# Patient Record
Sex: Female | Born: 1955
Health system: Southern US, Community
[De-identification: ages and names within clinical notes are randomized; demographics above are authoritative.]

## PROBLEM LIST (undated history)

## (undated) DIAGNOSIS — Z905 Acquired absence of kidney: Secondary | ICD-10-CM

## (undated) DIAGNOSIS — I5032 Chronic diastolic (congestive) heart failure: Secondary | ICD-10-CM

## (undated) DIAGNOSIS — M1990 Unspecified osteoarthritis, unspecified site: Secondary | ICD-10-CM

## (undated) DIAGNOSIS — R52 Pain, unspecified: Secondary | ICD-10-CM

## (undated) DIAGNOSIS — I219 Acute myocardial infarction, unspecified: Secondary | ICD-10-CM

## (undated) DIAGNOSIS — R918 Other nonspecific abnormal finding of lung field: Secondary | ICD-10-CM

## (undated) DIAGNOSIS — R600 Localized edema: Secondary | ICD-10-CM

## (undated) DIAGNOSIS — F419 Anxiety disorder, unspecified: Secondary | ICD-10-CM

## (undated) DIAGNOSIS — I10 Essential (primary) hypertension: Secondary | ICD-10-CM

## (undated) DIAGNOSIS — E8809 Other disorders of plasma-protein metabolism, not elsewhere classified: Secondary | ICD-10-CM

## (undated) DIAGNOSIS — D249 Benign neoplasm of unspecified breast: Secondary | ICD-10-CM

## (undated) DIAGNOSIS — E039 Hypothyroidism, unspecified: Secondary | ICD-10-CM

## (undated) DIAGNOSIS — I2510 Atherosclerotic heart disease of native coronary artery without angina pectoris: Secondary | ICD-10-CM

## (undated) DIAGNOSIS — C649 Malignant neoplasm of unspecified kidney, except renal pelvis: Secondary | ICD-10-CM

## (undated) HISTORY — DX: Benign neoplasm of unspecified breast: D24.9

## (undated) HISTORY — PX: WISDOM TOOTH EXTRACTION: SHX21

## (undated) HISTORY — DX: Malignant neoplasm of unspecified kidney, except renal pelvis: C64.9

## (undated) HISTORY — PX: PORTA CATH INSERTION: CATH118285

## (undated) HISTORY — PX: OTHER SURGICAL HISTORY: SHX169

## (undated) HISTORY — DX: Acquired absence of kidney: Z90.5

## (undated) HISTORY — PX: CARDIAC CATHETERIZATION: SHX172

---
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
## 2000-07-05 ENCOUNTER — Encounter: Payer: Self-pay | Admitting: Family Medicine

## 2000-07-05 ENCOUNTER — Ambulatory Visit (HOSPITAL_COMMUNITY): Admission: RE | Admit: 2000-07-05 | Discharge: 2000-07-05 | Payer: Self-pay | Admitting: Family Medicine

## 2002-05-01 ENCOUNTER — Emergency Department (HOSPITAL_COMMUNITY): Admission: EM | Admit: 2002-05-01 | Discharge: 2002-05-01 | Payer: Self-pay | Admitting: *Deleted

## 2002-05-10 ENCOUNTER — Encounter: Payer: Self-pay | Admitting: Family Medicine

## 2002-05-10 ENCOUNTER — Encounter: Admission: RE | Admit: 2002-05-10 | Discharge: 2002-05-10 | Payer: Self-pay | Admitting: Family Medicine

## 2004-02-18 ENCOUNTER — Encounter: Admission: RE | Admit: 2004-02-18 | Discharge: 2004-02-18 | Payer: Self-pay | Admitting: Family Medicine

## 2009-09-25 ENCOUNTER — Ambulatory Visit (HOSPITAL_COMMUNITY): Admission: RE | Admit: 2009-09-25 | Discharge: 2009-09-25 | Payer: Self-pay | Admitting: Obstetrics and Gynecology

## 2010-09-05 DIAGNOSIS — D249 Benign neoplasm of unspecified breast: Secondary | ICD-10-CM

## 2010-09-05 HISTORY — DX: Benign neoplasm of unspecified breast: D24.9

## 2010-09-13 ENCOUNTER — Ambulatory Visit (HOSPITAL_COMMUNITY): Admission: RE | Admit: 2010-09-13 | Payer: Self-pay | Source: Home / Self Care | Admitting: Family Medicine

## 2010-09-27 ENCOUNTER — Encounter: Payer: Self-pay | Admitting: Obstetrics and Gynecology

## 2011-06-17 ENCOUNTER — Other Ambulatory Visit: Payer: Self-pay | Admitting: Physician Assistant

## 2011-06-17 DIAGNOSIS — Z1231 Encounter for screening mammogram for malignant neoplasm of breast: Secondary | ICD-10-CM

## 2011-07-05 ENCOUNTER — Ambulatory Visit (HOSPITAL_COMMUNITY)
Admission: RE | Admit: 2011-07-05 | Discharge: 2011-07-05 | Disposition: A | Discharge status: Home / Self Care | Payer: 59 | Source: Ambulatory Visit | Attending: Physician Assistant | Admitting: Physician Assistant

## 2011-07-05 DIAGNOSIS — Z1231 Encounter for screening mammogram for malignant neoplasm of breast: Secondary | ICD-10-CM

## 2011-07-11 ENCOUNTER — Other Ambulatory Visit: Payer: Self-pay | Admitting: Physician Assistant

## 2011-07-11 ENCOUNTER — Other Ambulatory Visit: Payer: 59

## 2011-07-11 DIAGNOSIS — R928 Other abnormal and inconclusive findings on diagnostic imaging of breast: Secondary | ICD-10-CM

## 2011-07-12 ENCOUNTER — Other Ambulatory Visit: Payer: Self-pay | Admitting: Diagnostic Radiology

## 2011-07-12 ENCOUNTER — Other Ambulatory Visit: Payer: Self-pay | Admitting: Physician Assistant

## 2011-07-12 ENCOUNTER — Ambulatory Visit
Admission: RE | Admit: 2011-07-12 | Discharge: 2011-07-12 | Disposition: A | Discharge status: Home / Self Care | Payer: 59 | Source: Ambulatory Visit | Attending: Physician Assistant | Admitting: Physician Assistant

## 2011-07-12 DIAGNOSIS — R928 Other abnormal and inconclusive findings on diagnostic imaging of breast: Secondary | ICD-10-CM

## 2011-08-01 ENCOUNTER — Encounter (INDEPENDENT_AMBULATORY_CARE_PROVIDER_SITE_OTHER): Payer: Self-pay | Admitting: General Surgery

## 2011-08-01 ENCOUNTER — Ambulatory Visit (INDEPENDENT_AMBULATORY_CARE_PROVIDER_SITE_OTHER): Payer: 59 | Admitting: General Surgery

## 2011-08-01 ENCOUNTER — Other Ambulatory Visit (INDEPENDENT_AMBULATORY_CARE_PROVIDER_SITE_OTHER): Payer: Self-pay | Admitting: General Surgery

## 2011-08-01 VITALS — BP 160/108 | HR 80 | Temp 97.6°F | Resp 16 | Ht 68.0 in | Wt 170.8 lb

## 2011-08-01 DIAGNOSIS — N6320 Unspecified lump in the left breast, unspecified quadrant: Secondary | ICD-10-CM

## 2011-08-01 DIAGNOSIS — N63 Unspecified lump in unspecified breast: Secondary | ICD-10-CM

## 2011-08-01 HISTORY — DX: Unspecified lump in the left breast, unspecified quadrant: N63.20

## 2011-08-01 NOTE — Progress Notes (Signed)
Subjective:     Patient ID: Sheri Hernandez, female   DOB: 1956-07-07, 55 y.o.   MRN: [id number]  HPI We are asked to see the patient in consultation by Dr. Azucena Kuba to evaluate her for a left breast papilloma. The patient is a 55 year old white female who recently went for a routine screening mammogram. At that time an abnormality was seen in the 3:00 position of the left breast. This was biopsied and came back as a papilloma. She does not have any breast pain. She denies any discharge from her nipple. She has otherwise been in good health.  Review of Systems  Constitutional: Negative.   HENT: Negative.   Eyes: Negative.   Respiratory: Negative.   Cardiovascular: Negative.   Gastrointestinal: Negative.   Genitourinary: Negative.   Musculoskeletal: Negative.   Skin: Negative.   Neurological: Negative.   Hematological: Negative.   Psychiatric/Behavioral: Negative.        Objective:   Physical Exam  Constitutional: She is oriented to person, place, and time. She appears well-developed and well-nourished.  HENT:  Head: Normocephalic and atraumatic.  Eyes: Conjunctivae and EOM are normal. Pupils are equal, round, and reactive to light.  Neck: Normal range of motion. Neck supple.  Cardiovascular: Normal rate, regular rhythm and normal heart sounds.   Pulmonary/Chest: Effort normal and breath sounds normal.       No palpable mass in either breast. No axillary supraclavicular or cervical lymphadenopathy  Abdominal: Soft. Bowel sounds are normal. She exhibits no mass. There is no tenderness.  Musculoskeletal: Normal range of motion.  Lymphadenopathy:    She has no cervical adenopathy.  Neurological: She is alert and oriented to person, place, and time.  Skin: Skin is warm and dry.  Psychiatric: She has a normal mood and affect. Her behavior is normal.       Assessment:     Left breast papilloma    Plan:     Because of a slight increase risk of cancer and the fact that she has a  mass in her left breast at the site think she would benefit from having this area removed. She would also like to have this done. I have discussed with her in detail the risks and benefits of the operation remove this area as well as some of the technical aspects and she understands and wishes to proceed.

## 2011-08-05 ENCOUNTER — Encounter (HOSPITAL_COMMUNITY): Payer: Self-pay | Admitting: Respiratory Therapy

## 2011-08-11 ENCOUNTER — Encounter (HOSPITAL_COMMUNITY)
Admission: RE | Admit: 2011-08-11 | Discharge: 2011-08-11 | Disposition: A | Discharge status: Home / Self Care | Payer: 59 | Source: Ambulatory Visit | Attending: General Surgery | Admitting: General Surgery

## 2011-08-11 ENCOUNTER — Other Ambulatory Visit: Payer: Self-pay

## 2011-08-11 ENCOUNTER — Ambulatory Visit (HOSPITAL_COMMUNITY): Admission: RE | Admit: 2011-08-11 | Payer: 59 | Source: Ambulatory Visit

## 2011-08-11 ENCOUNTER — Encounter (HOSPITAL_COMMUNITY): Payer: Self-pay

## 2011-08-11 HISTORY — DX: Anxiety disorder, unspecified: F41.9

## 2011-08-11 LAB — CBC
HCT: 41.5 % (ref 36.0–46.0)
Hemoglobin: 14.5 g/dL (ref 12.0–15.0)
MCV: 86.5 fL (ref 78.0–100.0)
RBC: 4.8 MIL/uL (ref 3.87–5.11)
WBC: 8.7 10*3/uL (ref 4.0–10.5)

## 2011-08-11 LAB — SURGICAL PCR SCREEN: Staphylococcus aureus: POSITIVE — AB

## 2011-08-11 LAB — BASIC METABOLIC PANEL
BUN: 12 mg/dL (ref 6–23)
CO2: 28 mEq/L (ref 19–32)
Chloride: 103 mEq/L (ref 96–112)
GFR calc Af Amer: >90 mL/min (ref >90)
Glucose, Bld: 91 mg/dL (ref 70–99)
Potassium: 3.8 mEq/L (ref 3.5–5.1)

## 2011-08-11 NOTE — Progress Notes (Signed)
CXR IN EPIC 

## 2011-08-11 NOTE — Pre-Procedure Instructions (Signed)
20 Sheri Hernandez  08/11/2011   Your procedure is scheduled on:  08/18/11  Report to [facility] at 745 AM.  Call this number if you have problems the morning of surgery: [phone]   Remember:   Do not eat food:After Midnight.  May have clear liquids: up to 4 Hours before arrival.  Clear liquids include soda, tea, black coffee, apple or grape juice, broth.  Take these medicines the morning of surgery with A SIP OF WATER: NONE   Do not wear jewelry, make-up or nail polish.  Do not wear lotions, powders, or perfumes. You may wear deodorant.  Do not shave 48 hours prior to surgery.  Do not bring valuables to the hospital.  Contacts, dentures or bridgework may not be worn into surgery.  Leave suitcase in the car. After surgery it may be brought to your room.  For patients admitted to the hospital, checkout time is 11:00 AM the day of discharge.   Patients discharged the day of surgery will not be allowed to drive home.  Name and phone number of your driver: FAMILY  Special Instructions: CHG Shower Use Special Wash: 1/2 bottle night before surgery and 1/2 bottle morning of surgery.   Please read over the following fact sheets that you were given: Pain Booklet, MRSA Information and Surgical Site Infection Prevention

## 2011-08-17 MED ORDER — CEFAZOLIN SODIUM-DEXTROSE 2-3 GM-% IV SOLR
2.0000 g | INTRAVENOUS | Status: AC
  Administered 2011-08-18: 2 g via INTRAVENOUS
  Filled 2011-08-17: qty 50

## 2011-08-17 MED ORDER — CEFAZOLIN SODIUM-DEXTROSE 2-3 GM-% IV SOLR: 2.0000 g | INTRAVENOUS | Status: DC

## 2011-08-18 ENCOUNTER — Encounter (HOSPITAL_COMMUNITY): Payer: Self-pay | Admitting: Anesthesiology

## 2011-08-18 ENCOUNTER — Other Ambulatory Visit (INDEPENDENT_AMBULATORY_CARE_PROVIDER_SITE_OTHER): Payer: Self-pay | Admitting: General Surgery

## 2011-08-18 ENCOUNTER — Ambulatory Visit
Admission: RE | Admit: 2011-08-18 | Discharge: 2011-08-18 | Disposition: A | Discharge status: Home / Self Care | Payer: 59 | Source: Ambulatory Visit | Attending: General Surgery | Admitting: General Surgery

## 2011-08-18 ENCOUNTER — Encounter (HOSPITAL_COMMUNITY)
Admission: RE | Disposition: A | Discharge status: Home / Self Care | Payer: Self-pay | Source: Ambulatory Visit | Attending: General Surgery

## 2011-08-18 ENCOUNTER — Ambulatory Visit (HOSPITAL_COMMUNITY)
Admission: RE | Admit: 2011-08-18 | Discharge: 2011-08-18 | Disposition: A | Discharge status: Home / Self Care | Payer: 59 | Source: Ambulatory Visit | Attending: General Surgery | Admitting: General Surgery

## 2011-08-18 ENCOUNTER — Encounter (HOSPITAL_COMMUNITY): Payer: Self-pay | Admitting: *Deleted

## 2011-08-18 ENCOUNTER — Ambulatory Visit (HOSPITAL_COMMUNITY): Payer: 59 | Admitting: Anesthesiology

## 2011-08-18 DIAGNOSIS — Z01812 Encounter for preprocedural laboratory examination: Secondary | ICD-10-CM | POA: Insufficient documentation

## 2011-08-18 DIAGNOSIS — Z01810 Encounter for preprocedural cardiovascular examination: Secondary | ICD-10-CM | POA: Insufficient documentation

## 2011-08-18 DIAGNOSIS — N6320 Unspecified lump in the left breast, unspecified quadrant: Secondary | ICD-10-CM

## 2011-08-18 DIAGNOSIS — D249 Benign neoplasm of unspecified breast: Secondary | ICD-10-CM

## 2011-08-18 HISTORY — PX: BREAST BIOPSY: SHX20

## 2011-08-18 SURGERY — BREAST BIOPSY WITH NEEDLE LOCALIZATION
Anesthesia: General | Site: Breast | Laterality: Left | Wound class: Clean

## 2011-08-18 MED ORDER — HYDROCODONE-ACETAMINOPHEN 5-325 MG PO TABS: 1.0000 | ORAL_TABLET | ORAL | Status: AC | PRN

## 2011-08-18 MED ORDER — ROCURONIUM BROMIDE 100 MG/10ML IV SOLN
INTRAVENOUS | Status: DC | PRN
  Administered 2011-08-18: 30 mg via INTRAVENOUS

## 2011-08-18 MED ORDER — MIDAZOLAM HCL 5 MG/5ML IJ SOLN
INTRAMUSCULAR | Status: DC | PRN
  Administered 2011-08-18: 2 mg via INTRAVENOUS

## 2011-08-18 MED ORDER — PROPOFOL 10 MG/ML IV EMUL
INTRAVENOUS | Status: DC | PRN
  Administered 2011-08-18: 150 mg via INTRAVENOUS

## 2011-08-18 MED ORDER — LIDOCAINE HCL (CARDIAC) 20 MG/ML IV SOLN
INTRAVENOUS | Status: DC | PRN
  Administered 2011-08-18: 80 mg via INTRAVENOUS

## 2011-08-18 MED ORDER — HYDROMORPHONE HCL PF 1 MG/ML IJ SOLN: 0.2500 mg | INTRAMUSCULAR | Status: DC | PRN

## 2011-08-18 MED ORDER — GLYCOPYRROLATE 0.2 MG/ML IJ SOLN
INTRAMUSCULAR | Status: DC | PRN
  Administered 2011-08-18: .8 mg via INTRAVENOUS

## 2011-08-18 MED ORDER — ONDANSETRON HCL 4 MG/2ML IJ SOLN
INTRAMUSCULAR | Status: DC | PRN
  Administered 2011-08-18: 4 mg via INTRAVENOUS

## 2011-08-18 MED ORDER — ONDANSETRON HCL 4 MG/2ML IJ SOLN
4.0000 mg | Freq: Four times a day (QID) | INTRAMUSCULAR | Status: DC | PRN
Start: 2011-08-18 — End: 2011-08-18

## 2011-08-18 MED ORDER — FENTANYL CITRATE 0.05 MG/ML IJ SOLN
INTRAMUSCULAR | Status: DC | PRN
  Administered 2011-08-18: 50 ug via INTRAVENOUS

## 2011-08-18 MED ORDER — BUPIVACAINE-EPINEPHRINE 0.25% -1:200000 IJ SOLN
INTRAMUSCULAR | Status: DC | PRN
  Administered 2011-08-18: 13 mL

## 2011-08-18 MED ORDER — NEOSTIGMINE METHYLSULFATE 1 MG/ML IJ SOLN
INTRAMUSCULAR | Status: DC | PRN
  Administered 2011-08-18: 5 mg via INTRAVENOUS

## 2011-08-18 MED ORDER — LACTATED RINGERS IV SOLN
INTRAVENOUS | Status: DC | PRN
  Administered 2011-08-18 (×2): via INTRAVENOUS

## 2011-08-18 SURGICAL SUPPLY — 43 items
APPLIER CLIP 9.375 MED OPEN (MISCELLANEOUS) ×2
BINDER BREAST LRG (GAUZE/BANDAGES/DRESSINGS) IMPLANT
BINDER BREAST XLRG (GAUZE/BANDAGES/DRESSINGS) IMPLANT
BLADE SURG 10 STRL SS (BLADE) ×2 IMPLANT
BLADE SURG 15 STRL LF DISP TIS (BLADE) IMPLANT
BLADE SURG 15 STRL SS (BLADE)
CANISTER SUCTION 2500CC (MISCELLANEOUS) IMPLANT
CHLORAPREP W/TINT 26ML (MISCELLANEOUS) ×2 IMPLANT
CLIP APPLIE 9.375 MED OPEN (MISCELLANEOUS) ×1 IMPLANT
CLOTH BEACON ORANGE TIMEOUT ST (SAFETY) ×2 IMPLANT
CONT SPEC 4OZ CLIKSEAL STRL BL (MISCELLANEOUS) IMPLANT
COVER SURGICAL LIGHT HANDLE (MISCELLANEOUS) ×2 IMPLANT
DERMABOND ADHESIVE PROPEN (GAUZE/BANDAGES/DRESSINGS) ×1
DERMABOND ADVANCED (GAUZE/BANDAGES/DRESSINGS) ×1
DERMABOND ADVANCED .7 DNX12 (GAUZE/BANDAGES/DRESSINGS) ×1 IMPLANT
DERMABOND ADVANCED .7 DNX6 (GAUZE/BANDAGES/DRESSINGS) ×1 IMPLANT
DEVICE DUBIN SPECIMEN MAMMOGRA (MISCELLANEOUS) ×2 IMPLANT
DRAPE LAPAROSCOPIC ABDOMINAL (DRAPES) ×2 IMPLANT
ELECT COATED BLADE 2.86 ST (ELECTRODE) ×2 IMPLANT
ELECT REM PT RETURN 9FT ADLT (ELECTROSURGICAL) ×2
ELECTRODE REM PT RTRN 9FT ADLT (ELECTROSURGICAL) ×1 IMPLANT
GLOVE BIO SURGEON STRL SZ7.5 (GLOVE) ×2 IMPLANT
GOWN STRL NON-REIN LRG LVL3 (GOWN DISPOSABLE) ×6 IMPLANT
KIT BASIN OR (CUSTOM PROCEDURE TRAY) ×2 IMPLANT
KIT MARKER MARGIN INK (KITS) IMPLANT
KIT ROOM TURNOVER OR (KITS) ×2 IMPLANT
NEEDLE HYPO 25GX1X1/2 BEV (NEEDLE) ×2 IMPLANT
NS IRRIG 1000ML POUR BTL (IV SOLUTION) ×2 IMPLANT
PACK SURGICAL SETUP 50X90 (CUSTOM PROCEDURE TRAY) ×2 IMPLANT
PAD ARMBOARD 7.5X6 YLW CONV (MISCELLANEOUS) ×2 IMPLANT
PENCIL BUTTON HOLSTER BLD 10FT (ELECTRODE) ×2 IMPLANT
SPONGE LAP 18X18 X RAY DECT (DISPOSABLE) ×2 IMPLANT
SUT MON AB 4-0 PC3 18 (SUTURE) ×2 IMPLANT
SUT SILK 2 0 SH (SUTURE) ×2 IMPLANT
SUT VIC AB 3-0 SH 27 (SUTURE) ×1
SUT VIC AB 3-0 SH 27XBRD (SUTURE) ×1 IMPLANT
SYR BULB 3OZ (MISCELLANEOUS) ×2 IMPLANT
SYR CONTROL 10ML LL (SYRINGE) ×4 IMPLANT
TOWEL OR 17X24 6PK STRL BLUE (TOWEL DISPOSABLE) ×2 IMPLANT
TOWEL OR 17X26 10 PK STRL BLUE (TOWEL DISPOSABLE) ×2 IMPLANT
TUBE CONNECTING 12X1/4 (SUCTIONS) IMPLANT
WATER STERILE IRR 1000ML POUR (IV SOLUTION) IMPLANT
YANKAUER SUCT BULB TIP NO VENT (SUCTIONS) ×2 IMPLANT

## 2011-08-18 NOTE — Anesthesia Postprocedure Evaluation (Signed)
Anesthesia Post Note  Patient: Sheri Hernandez  Procedure(s) Performed:  BREAST BIOPSY WITH NEEDLE LOCALIZATION - left breast biopsy with needle localization  Anesthesia type: General  Patient location: PACU  Post pain: Pain level controlled and Adequate analgesia  Post assessment: Post-op Vital signs reviewed, Patient's Cardiovascular Status Stable, Respiratory Function Stable, Patent Airway and Pain level controlled  Last Vitals:  Filed Vitals:   08/18/11 1240  BP: 137/82  Pulse: 68  Temp:   Resp: 19    Post vital signs: Reviewed and stable  Level of consciousness: awake, alert  and oriented  Complications: No apparent anesthesia complications

## 2011-08-18 NOTE — Anesthesia Preprocedure Evaluation (Addendum)
Anesthesia Evaluation  Patient identified by MRN, date of birth, ID band Patient awake    Reviewed: Allergy & Precautions, H&P , NPO status , Patient's Chart, lab work & pertinent test results  Airway Mallampati: II  Neck ROM: full    Dental  (+) Dental Advisory Given and Teeth Intact   Pulmonary          Cardiovascular     Neuro/Psych    GI/Hepatic   Endo/Other    Renal/GU      Musculoskeletal   Abdominal   Peds  Hematology   Anesthesia Other Findings   Reproductive/Obstetrics                          Anesthesia Physical Anesthesia Plan  ASA: II  Anesthesia Plan: General   Post-op Pain Management:    Induction: Intravenous  Airway Management Planned: LMA  Additional Equipment:   Intra-op Plan:   Post-operative Plan:   Informed Consent: I have reviewed the patients History and Physical, chart, labs and discussed the procedure including the risks, benefits and alternatives for the proposed anesthesia with the patient or authorized representative who has indicated his/her understanding and acceptance.     Plan Discussed with: CRNA and Surgeon  Anesthesia Plan Comments:         Anesthesia Quick Evaluation

## 2011-08-18 NOTE — Interval H&P Note (Signed)
History and Physical Interval Note:  08/18/2011 9:48 AM  Sheri Hernandez  has presented today for surgery, with the diagnosis of left breast papilloma  The various methods of treatment have been discussed with the patient and family. After consideration of risks, benefits and other options for treatment, the patient has consented to  Procedure(s): BREAST BIOPSY WITH NEEDLE LOCALIZATION as a surgical intervention .  The patients' history has been reviewed, patient examined, no change in status, stable for surgery.  I have reviewed the patients' chart and labs.  Questions were answered to the patient's satisfaction.     TOTH III,[name]

## 2011-08-18 NOTE — Transfer of Care (Signed)
Immediate Anesthesia Transfer of Care Note  Patient: Sheri Hernandez  Procedure(s) Performed:  BREAST BIOPSY WITH NEEDLE LOCALIZATION - left breast biopsy with needle localization  Patient Location: PACU  Anesthesia Type: General  Level of Consciousness: awake and sedated  Airway & Oxygen Therapy: Patient Spontanous Breathing and Patient connected to nasal cannula oxygen  Post-op Assessment: Report given to PACU RN  Post vital signs: Reviewed and stable  Complications: No apparent anesthesia complications

## 2011-08-18 NOTE — H&P (View-Only) (Signed)
Subjective:     Patient ID: Sheri Hernandez, female   DOB: 07/05/1956, 55 y.o.   MRN: [id number]  HPI We are asked to see the patient in consultation by Dr. Reid to evaluate her for a left breast papilloma. The patient is a 55-year-old white female who recently went for a routine screening mammogram. At that time an abnormality was seen in the 3:00 position of the left breast. This was biopsied and came back as a papilloma. She does not have any breast pain. She denies any discharge from her nipple. She has otherwise been in good health.  Review of Systems  Constitutional: Negative.   HENT: Negative.   Eyes: Negative.   Respiratory: Negative.   Cardiovascular: Negative.   Gastrointestinal: Negative.   Genitourinary: Negative.   Musculoskeletal: Negative.   Skin: Negative.   Neurological: Negative.   Hematological: Negative.   Psychiatric/Behavioral: Negative.        Objective:   Physical Exam  Constitutional: She is oriented to person, place, and time. She appears well-developed and well-nourished.  HENT:  Head: Normocephalic and atraumatic.  Eyes: Conjunctivae and EOM are normal. Pupils are equal, round, and reactive to light.  Neck: Normal range of motion. Neck supple.  Cardiovascular: Normal rate, regular rhythm and normal heart sounds.   Pulmonary/Chest: Effort normal and breath sounds normal.       No palpable mass in either breast. No axillary supraclavicular or cervical lymphadenopathy  Abdominal: Soft. Bowel sounds are normal. She exhibits no mass. There is no tenderness.  Musculoskeletal: Normal range of motion.  Lymphadenopathy:    She has no cervical adenopathy.  Neurological: She is alert and oriented to person, place, and time.  Skin: Skin is warm and dry.  Psychiatric: She has a normal mood and affect. Her behavior is normal.       Assessment:     Left breast papilloma    Plan:     Because of a slight increase risk of cancer and the fact that she has a  mass in her left breast at the site think she would benefit from having this area removed. She would also like to have this done. I have discussed with her in detail the risks and benefits of the operation remove this area as well as some of the technical aspects and she understands and wishes to proceed.      

## 2011-08-18 NOTE — Preoperative (Signed)
Beta Blockers   Reason not to administer Beta Blockers:Not Applicable 

## 2011-08-18 NOTE — Anesthesia Procedure Notes (Signed)
Procedure Name: Intubation Date/Time: 08/18/2011 1:00 AM Performed by: Malachi Pro Pre-anesthesia Checklist: Patient identified, Emergency Drugs available, Suction available, Patient being monitored and Timeout performed Patient Re-evaluated:Patient Re-evaluated prior to inductionOxygen Delivery Method: Circle System Utilized Preoxygenation: Pre-oxygenation with 100% oxygen Intubation Type: Combination inhalational/ intravenous induction Ventilation: Mask ventilation without difficulty Grade View: Grade II Tube type: Oral Tube size: 7.5 mm Number of attempts: 1 Airway Equipment and Method: stylet Placement Confirmation: positive ETCO2,  breath sounds checked- equal and bilateral and ETT inserted through vocal cords under direct vision Secured at: 23 cm Tube secured with: Tape Dental Injury: Teeth and Oropharynx as per pre-operative assessment

## 2011-08-18 NOTE — Op Note (Signed)
08/18/2011  11:40 AM  PATIENT:  Sheri Hernandez  55 y.o. female  PRE-OPERATIVE DIAGNOSIS:  left breast papilloma  POST-OPERATIVE DIAGNOSIS:  left breast papilloma  PROCEDURE:  Procedure(s): BREAST BIOPSY WITH NEEDLE LOCALIZATION  SURGEON:  Surgeon(s): Caleen Essex III, MD  PHYSICIAN ASSISTANT:   ASSISTANTS: none   ANESTHESIA:   general  EBL:  Total I/O In: 1400 [I.V.:1400] Out: 25 [Blood:25]  BLOOD ADMINISTERED:none  DRAINS: none   LOCAL MEDICATIONS USED:  MARCAINE 13CC  SPECIMEN:  Excision  DISPOSITION OF SPECIMEN:  PATHOLOGY  COUNTS:  YES  TOURNIQUET:  * No tourniquets in log *  DICTATION: .Dragon Dictation after informed consent was obtained the patient was brought to the operating room placed in the supine position on the operating room table. After adequate induction of general anesthesia the patient's left breast was prepped with ChloraPrep, while the dry, and draped in usual sterile fashion. Earlier in the day the patient underwent a wire localization procedure and the wire was entering the left breast laterally and headed medially just beneath the areola. A curvilinear incision was made with a 15 blade knife along the edge of the areola laterally. This incision was carried down through the skin and subcutaneous tissue sharply with the electrocautery. Once into the breast tissues the path of the wire could be palpated. A circular portion of breast tissue was excised sharply around the path of the wire with the electrocautery. Once the specimen was removed a specimen radiograph was obtained. The clip and mass were both within the specimen. Hemostasis was achieved using the Bovie electrocautery. The wound was infiltrated with quarter percent Marcaine. The deep layer of the wound was then closed with interrupted 3-0 Vicryl stitches. The skin was closed with interrupted 4 Monocryl subcuticular stitches. Dermabond dressing was applied.  The patient tolerated the procedure  well. At the end of the case all needle sponge and instrument counts were correct. The patient was then awakened and taken to recovery in stable condition.  PLAN OF CARE: Discharge to home after PACU  PATIENT DISPOSITION:  PACU - hemodynamically stable.   Delay start of Pharmacological VTE agent (>24hrs) due to surgical blood loss or risk of bleeding:  {YES/NO/NOT APPLICABLE:20182

## 2011-08-19 ENCOUNTER — Encounter (HOSPITAL_COMMUNITY): Payer: Self-pay | Admitting: General Surgery

## 2011-09-09 ENCOUNTER — Ambulatory Visit (INDEPENDENT_AMBULATORY_CARE_PROVIDER_SITE_OTHER): Payer: 59 | Admitting: General Surgery

## 2011-09-09 ENCOUNTER — Encounter (INDEPENDENT_AMBULATORY_CARE_PROVIDER_SITE_OTHER): Payer: Self-pay | Admitting: General Surgery

## 2011-09-09 VITALS — BP 144/92 | HR 68 | Temp 97.8°F | Resp 18 | Ht 68.0 in | Wt 167.8 lb

## 2011-09-09 DIAGNOSIS — N6320 Unspecified lump in the left breast, unspecified quadrant: Secondary | ICD-10-CM

## 2011-09-09 DIAGNOSIS — N63 Unspecified lump in unspecified breast: Secondary | ICD-10-CM

## 2011-09-13 ENCOUNTER — Encounter (INDEPENDENT_AMBULATORY_CARE_PROVIDER_SITE_OTHER): Payer: Self-pay | Admitting: General Surgery

## 2011-09-13 NOTE — Progress Notes (Signed)
Subjective:     Patient ID: Sheri Hernandez, female   DOB: 18-Feb-1956, 56 y.o.   MRN: [id number]  HPI The patient is a 56 year old white female who is now 3 weeks out from a left breast biopsy. Since the surgery she's had some mild soreness but this is gradually improving. She's had no drainage or discharge from the area. Her pathology showed the area to be a benign papilloma.  Review of Systems     Objective:   Physical Exam On exam her left breast incision is healing nicely. There is no sign of infection.    Assessment:     Status post left breast biopsy for benign lesion    Plan:     At this point she seems to be healing very nicely. I think she can go back to her normal screening mammogram schedule. We will turn her care back over to her medical doctor. She agrees tocolysis she has any problems in the future.

## 2011-10-13 ENCOUNTER — Encounter (INDEPENDENT_AMBULATORY_CARE_PROVIDER_SITE_OTHER): Payer: Self-pay | Admitting: Surgery

## 2011-10-13 ENCOUNTER — Ambulatory Visit (INDEPENDENT_AMBULATORY_CARE_PROVIDER_SITE_OTHER): Payer: 59 | Admitting: Surgery

## 2011-10-13 VITALS — BP 142/90 | HR 96 | Temp 97.0°F | Resp 20 | Ht 68.0 in | Wt 170.0 lb

## 2011-10-13 DIAGNOSIS — Z9889 Other specified postprocedural states: Secondary | ICD-10-CM

## 2011-10-13 NOTE — Progress Notes (Signed)
Chief complaint: Problem with her incision  History of present illness: The patient underwent excisional biopsy of an intraductal papilloma from the left breast on August 18, 2011. She is basically done fine since then but she is noticed in some greenish "moldy" looking material and she was concerned about this. She came to the urgent office for evaluation.  Exam: Breasts: The incision in the left circumareolar area is healing nicely. There is some old Dermabond still coating the area between the nipple and the incision which I peeled off. This was a material that she was concerned about. There was no evidence of any infection.  Impression doing well  Plan: RTC PRN

## 2012-06-29 ENCOUNTER — Other Ambulatory Visit: Payer: Self-pay | Admitting: Family Medicine

## 2012-06-29 ENCOUNTER — Ambulatory Visit
Admission: RE | Admit: 2012-06-29 | Discharge: 2012-06-29 | Disposition: A | Discharge status: Home / Self Care | Payer: 59 | Source: Ambulatory Visit | Attending: Family Medicine | Admitting: Family Medicine

## 2012-06-29 DIAGNOSIS — R55 Syncope and collapse: Secondary | ICD-10-CM

## 2012-06-29 DIAGNOSIS — R519 Headache, unspecified: Secondary | ICD-10-CM

## 2012-06-29 MED ORDER — GADOBENATE DIMEGLUMINE 529 MG/ML IV SOLN
15.0000 mL | Freq: Once | INTRAVENOUS | Status: AC | PRN
  Administered 2012-06-29: 15 mL via INTRAVENOUS

## 2012-09-05 HISTORY — PX: BRONCHIAL BRUSH BIOPSY: SHX448

## 2012-09-25 ENCOUNTER — Other Ambulatory Visit: Payer: Self-pay | Admitting: Family Medicine

## 2012-09-25 ENCOUNTER — Ambulatory Visit
Admission: RE | Admit: 2012-09-25 | Discharge: 2012-09-25 | Disposition: A | Discharge status: Home / Self Care | Payer: 59 | Source: Ambulatory Visit | Attending: Family Medicine | Admitting: Family Medicine

## 2012-09-25 DIAGNOSIS — R0989 Other specified symptoms and signs involving the circulatory and respiratory systems: Secondary | ICD-10-CM

## 2012-09-25 DIAGNOSIS — R059 Cough, unspecified: Secondary | ICD-10-CM

## 2012-09-25 DIAGNOSIS — R05 Cough: Secondary | ICD-10-CM

## 2012-09-27 ENCOUNTER — Other Ambulatory Visit: Payer: Self-pay | Admitting: Family Medicine

## 2012-09-27 DIAGNOSIS — R918 Other nonspecific abnormal finding of lung field: Secondary | ICD-10-CM

## 2012-09-28 ENCOUNTER — Ambulatory Visit
Admission: RE | Admit: 2012-09-28 | Discharge: 2012-09-28 | Disposition: A | Discharge status: Home / Self Care | Payer: 59 | Source: Ambulatory Visit | Attending: Family Medicine | Admitting: Family Medicine

## 2012-09-28 DIAGNOSIS — R918 Other nonspecific abnormal finding of lung field: Secondary | ICD-10-CM

## 2012-10-01 ENCOUNTER — Encounter: Payer: Self-pay | Admitting: Pulmonary Disease

## 2012-10-01 ENCOUNTER — Ambulatory Visit (INDEPENDENT_AMBULATORY_CARE_PROVIDER_SITE_OTHER): Payer: 59 | Admitting: Pulmonary Disease

## 2012-10-01 ENCOUNTER — Telehealth: Payer: Self-pay | Admitting: *Deleted

## 2012-10-01 VITALS — BP 122/78 | HR 99 | Temp 98.1°F | Ht 69.0 in | Wt 163.8 lb

## 2012-10-01 DIAGNOSIS — N2889 Other specified disorders of kidney and ureter: Secondary | ICD-10-CM | POA: Insufficient documentation

## 2012-10-01 DIAGNOSIS — R918 Other nonspecific abnormal finding of lung field: Secondary | ICD-10-CM | POA: Insufficient documentation

## 2012-10-01 DIAGNOSIS — R222 Localized swelling, mass and lump, trunk: Secondary | ICD-10-CM

## 2012-10-01 DIAGNOSIS — N289 Disorder of kidney and ureter, unspecified: Secondary | ICD-10-CM

## 2012-10-01 HISTORY — DX: Other specified disorders of kidney and ureter: N28.89

## 2012-10-01 MED ORDER — HYDROCODONE-HOMATROPINE 5-1.5 MG/5ML PO SYRP: 5.0000 mL | ORAL_SOLUTION | Freq: Four times a day (QID) | ORAL | Status: DC | PRN

## 2012-10-01 NOTE — Telephone Encounter (Signed)
Per [facility] for patients cough... Hycodan Syrup Take 5mL every 6 hours as needed per cough. #6oz/163mL  NO REFILLS.  Phoned into [facility] [address] [place], [place] Patients husband aware that this has been phoned in.  Patients husband Iantha Fallen did have a concern about the cough and wanted to clarify with Dr Shelle Iron. Is pt's cough d/t the lung condition or her bronchitis.   Please advise Dr Shelle Iron, thanks.

## 2012-10-01 NOTE — Addendum Note (Signed)
Addended by: Nita Sells on: 10/01/2012 11:53 AM   Modules accepted: Orders

## 2012-10-01 NOTE — Assessment & Plan Note (Signed)
The patient has bilateral masses that are most consistent with metastatic disease from her renal mass that is felt to be a renal cell carcinoma.  She also has a very large right infrahilar mass with compression of her bronchus intermedius.  She is obviously going to need a PET scan, and then weaning to decide what is the easiest site and to gain tissue.  My only other question is whether her right infrahilar mass could be something different from the rest of her pulmonary findings.  With her long-standing history of smoking, this could represent small cell lung cancer.  We will do the PET scan and then decide about biopsy site.

## 2012-10-01 NOTE — Telephone Encounter (Signed)
Left message with phone number to call

## 2012-10-01 NOTE — Patient Instructions (Addendum)
Will setup for  PET scan, and will call with results Will decide about biopsy once the pet scan is done.

## 2012-10-01 NOTE — Progress Notes (Signed)
  Subjective:    Patient ID: Sheri Hernandez, female    DOB: 06/16/1956, 57 y.o.   MRN: [id number]  HPI The patient is a 57-year-old female who I've been asked to see for an abnormal CT chest.  She's been having a persistent cough, and a recent chest x-ray showed abnormal densities.  She then underwent a CT scan of her chest and abdomen, and the abdominal CT showed a large left renal mass most consistent with renal cell carcinoma.  The CT of her chest showed large rounded masses bilaterally, most consistent with metastatic disease.  She also had a large right infrahilar lymph node mass.  The patient does have a long-standing history of smoking.  She has not had any hemoptysis, and has been eating fairly well.  Her weight has been stable except for a few pounds of weight loss over the last 2 weeks.  She denies any abdominal or chest pain, but does have upper back discomfort.   Review of Systems  Constitutional: Positive for appetite change ( slowly returning since Hycodan syrup usage). Negative for fever and unexpected weight change.  HENT: Positive for congestion and postnasal drip. Negative for ear pain, nosebleeds, sore throat, rhinorrhea, sneezing, trouble swallowing, dental problem and sinus pressure.   Eyes: Negative for redness and itching.  Respiratory: Positive for cough ( dry---unable to cough up mucus) and wheezing ( x 4-5 days). Negative for chest tightness and shortness of breath.   Cardiovascular: Negative for palpitations and leg swelling.  Gastrointestinal: Negative for nausea and vomiting.  Genitourinary: Negative for dysuria.  Musculoskeletal: Negative for joint swelling.  Skin: Negative for rash.  Neurological: Negative for headaches.  Hematological: Does not bruise/bleed easily.  Psychiatric/Behavioral: Positive for dysphoric mood. The patient is nervous/anxious.        Objective:   Physical Exam Constitutional:  Well developed, no acute distress  HENT:  Nares patent  without discharge  Oropharynx without exudate, palate and uvula are normal  Eyes:  Perrla, eomi, no scleral icterus  Neck:  No JVD, no TMG  Cardiovascular:  Normal rate, regular rhythm, no rubs or gallops.  No murmurs        Intact distal pulses  Pulmonary :  Normal breath sounds, no stridor or respiratory distress   No rales, rhonchi, or wheezing  Abdominal:  Soft, nondistended, bowel sounds present.  No tenderness noted.   Musculoskeletal:  No lower extremity edema noted.  Lymph Nodes:  No cervical lymphadenopathy noted  Skin:  No cyanosis noted  Neurologic:  Alert, appropriate, moves all 4 extremities without obvious deficit.         Assessment & Plan:   

## 2012-10-01 NOTE — Telephone Encounter (Signed)
Left voice mail message with phone number to call

## 2012-10-02 ENCOUNTER — Telehealth: Payer: Self-pay | Admitting: *Deleted

## 2012-10-02 NOTE — Telephone Encounter (Signed)
Left message for pt to call with phone number 

## 2012-10-02 NOTE — Telephone Encounter (Signed)
Spoke with pt regarding referral and what will happen next for pt.  She verbalized understanding

## 2012-10-05 ENCOUNTER — Ambulatory Visit (HOSPITAL_COMMUNITY)
Admission: RE | Admit: 2012-10-05 | Discharge: 2012-10-05 | Disposition: A | Discharge status: Home / Self Care | Payer: 59 | Source: Ambulatory Visit | Attending: Pulmonary Disease | Admitting: Pulmonary Disease

## 2012-10-05 ENCOUNTER — Encounter (HOSPITAL_COMMUNITY): Payer: Self-pay

## 2012-10-05 DIAGNOSIS — N289 Disorder of kidney and ureter, unspecified: Secondary | ICD-10-CM | POA: Insufficient documentation

## 2012-10-05 DIAGNOSIS — R222 Localized swelling, mass and lump, trunk: Secondary | ICD-10-CM | POA: Insufficient documentation

## 2012-10-05 DIAGNOSIS — R918 Other nonspecific abnormal finding of lung field: Secondary | ICD-10-CM

## 2012-10-05 MED ORDER — FLUDEOXYGLUCOSE F - 18 (FDG) INJECTION
19.1000 | Freq: Once | INTRAVENOUS | Status: AC | PRN
  Administered 2012-10-05: 19.1 via INTRAVENOUS

## 2012-10-08 ENCOUNTER — Telehealth: Payer: Self-pay | Admitting: Pulmonary Disease

## 2012-10-08 NOTE — Telephone Encounter (Signed)
Spouse called back again re: same. Wants to hear back soon today- not later in day. Sheri Hernandez

## 2012-10-08 NOTE — Telephone Encounter (Signed)
I spoke with the pt husband and advised that we do not have hte results available and that Dr. Shelle Iron is seeing patients today but that I will send him a message to let him know that he is requesting the results. He also states for Dr. Shelle Iron to call [phone], do not call the pt with results. Please advise. Carron Curie, CMA

## 2012-10-10 ENCOUNTER — Encounter (HOSPITAL_COMMUNITY): Payer: Self-pay

## 2012-10-10 ENCOUNTER — Telehealth: Payer: Self-pay | Admitting: *Deleted

## 2012-10-10 NOTE — Telephone Encounter (Signed)
Done.  Discussed need for bronchoscopy to diagnosis large right hilar/mediastinal mass to make sure is not a concomitant lung cancer.  He agrees and will discuss with wife.  Her prefers that we talk with him because he is distraught, but will discuss with her as well on the day of the procedure to talk about risk/benefits.

## 2012-10-10 NOTE — Telephone Encounter (Signed)
Patients husband aware of Bronch appt and what all he needs to do the morning of the procedure. Pt to call if anything further needed.

## 2012-10-10 NOTE — Telephone Encounter (Signed)
Pt's spouse called back again re: biopsy. [phone]. Sheri Hernandez

## 2012-10-10 NOTE — Telephone Encounter (Signed)
Left message on voicemail to return call--Mr [facility]. [phone]  Bronch scheduled for 10/11/12 at 730 at [facility]. Patient needs instructions for procedure:  NPO after 12am....HOLD morning meds Arrive at 6:15am at admitting--will be directed to CardioPulmonary Dept.  Bring insurance information with you.

## 2012-10-10 NOTE — Telephone Encounter (Signed)
Dr. Shelle Iron, pts husband returned your call.  He can be reached today at the # provided below.

## 2012-10-10 NOTE — Telephone Encounter (Signed)
Pt's husband states he is returning KC's call & asked to be reached today at [phone].  Thanks!  Antionette Fairy

## 2012-10-11 ENCOUNTER — Ambulatory Visit (HOSPITAL_COMMUNITY): Payer: 59

## 2012-10-11 ENCOUNTER — Ambulatory Visit (HOSPITAL_COMMUNITY)
Admission: RE | Admit: 2012-10-11 | Discharge: 2012-10-11 | Disposition: A | Discharge status: Home / Self Care | Payer: 59 | Source: Ambulatory Visit | Attending: Pulmonary Disease | Admitting: Pulmonary Disease

## 2012-10-11 ENCOUNTER — Encounter (HOSPITAL_COMMUNITY): Payer: Self-pay | Admitting: Radiology

## 2012-10-11 ENCOUNTER — Encounter (HOSPITAL_COMMUNITY)
Admission: RE | Disposition: A | Discharge status: Home / Self Care | Payer: Self-pay | Source: Ambulatory Visit | Attending: Pulmonary Disease

## 2012-10-11 DIAGNOSIS — R059 Cough, unspecified: Secondary | ICD-10-CM | POA: Insufficient documentation

## 2012-10-11 DIAGNOSIS — R222 Localized swelling, mass and lump, trunk: Secondary | ICD-10-CM | POA: Insufficient documentation

## 2012-10-11 DIAGNOSIS — F17200 Nicotine dependence, unspecified, uncomplicated: Secondary | ICD-10-CM | POA: Insufficient documentation

## 2012-10-11 DIAGNOSIS — R05 Cough: Secondary | ICD-10-CM | POA: Insufficient documentation

## 2012-10-11 HISTORY — PX: VIDEO BRONCHOSCOPY: SHX5072

## 2012-10-11 SURGERY — BRONCHOSCOPY, WITH FLUOROSCOPY
Anesthesia: Moderate Sedation | Laterality: Bilateral

## 2012-10-11 MED ORDER — MEPERIDINE HCL 100 MG/ML IJ SOLN
INTRAMUSCULAR | Status: AC
  Filled 2012-10-11: qty 2

## 2012-10-11 MED ORDER — LIDOCAINE HCL 2 % EX GEL
CUTANEOUS | Status: DC | PRN
  Administered 2012-10-11: 1

## 2012-10-11 MED ORDER — MIDAZOLAM HCL 10 MG/2ML IJ SOLN
INTRAMUSCULAR | Status: DC | PRN
  Administered 2012-10-11 (×2): 5 mg via INTRAVENOUS

## 2012-10-11 MED ORDER — PHENYLEPHRINE HCL 0.25 % NA SOLN
NASAL | Status: DC | PRN
  Administered 2012-10-11: 2 via NASAL

## 2012-10-11 MED ORDER — MEPERIDINE HCL 25 MG/ML IJ SOLN
INTRAMUSCULAR | Status: DC | PRN
  Administered 2012-10-11: 50 mg via INTRAVENOUS

## 2012-10-11 MED ORDER — LIDOCAINE HCL 1 % IJ SOLN
INTRAMUSCULAR | Status: DC | PRN
  Administered 2012-10-11: 6 mL via RESPIRATORY_TRACT

## 2012-10-11 MED ORDER — MIDAZOLAM HCL 10 MG/2ML IJ SOLN
INTRAMUSCULAR | Status: AC
  Filled 2012-10-11: qty 4

## 2012-10-11 NOTE — Progress Notes (Signed)
Bronchoscopy with video intervention,bronchial washing intervention,wang needle intervention,biopsy intervention performed

## 2012-10-11 NOTE — H&P (View-Only) (Signed)
  Subjective:    Patient ID: Sheri Hernandez, female    DOB: 1956/03/01, 57 y.o.   MRN: [id number]  HPI The patient is a 57 year old female who I've been asked to see for an abnormal CT chest.  She's been having a persistent cough, and a recent chest x-ray showed abnormal densities.  She then underwent a CT scan of her chest and abdomen, and the abdominal CT showed a large left renal mass most consistent with renal cell carcinoma.  The CT of her chest showed large rounded masses bilaterally, most consistent with metastatic disease.  She also had a large right infrahilar lymph node mass.  The patient does have a long-standing history of smoking.  She has not had any hemoptysis, and has been eating fairly well.  Her weight has been stable except for a few pounds of weight loss over the last 2 weeks.  She denies any abdominal or chest pain, but does have upper back discomfort.   Review of Systems  Constitutional: Positive for appetite change ( slowly returning since Hycodan syrup usage). Negative for fever and unexpected weight change.  HENT: Positive for congestion and postnasal drip. Negative for ear pain, nosebleeds, sore throat, rhinorrhea, sneezing, trouble swallowing, dental problem and sinus pressure.   Eyes: Negative for redness and itching.  Respiratory: Positive for cough ( dry---unable to cough up mucus) and wheezing ( x 4-5 days). Negative for chest tightness and shortness of breath.   Cardiovascular: Negative for palpitations and leg swelling.  Gastrointestinal: Negative for nausea and vomiting.  Genitourinary: Negative for dysuria.  Musculoskeletal: Negative for joint swelling.  Skin: Negative for rash.  Neurological: Negative for headaches.  Hematological: Does not bruise/bleed easily.  Psychiatric/Behavioral: Positive for dysphoric mood. The patient is nervous/anxious.        Objective:   Physical Exam Constitutional:  Well developed, no acute distress  HENT:  Nares patent  without discharge  Oropharynx without exudate, palate and uvula are normal  Eyes:  Perrla, eomi, no scleral icterus  Neck:  No JVD, no TMG  Cardiovascular:  Normal rate, regular rhythm, no rubs or gallops.  No murmurs        Intact distal pulses  Pulmonary :  Normal breath sounds, no stridor or respiratory distress   No rales, rhonchi, or wheezing  Abdominal:  Soft, nondistended, bowel sounds present.  No tenderness noted.   Musculoskeletal:  No lower extremity edema noted.  Lymph Nodes:  No cervical lymphadenopathy noted  Skin:  No cyanosis noted  Neurologic:  Alert, appropriate, moves all 4 extremities without obvious deficit.         Assessment & Plan:

## 2012-10-11 NOTE — Brief Op Note (Signed)
Dictation number: [id number]

## 2012-10-11 NOTE — Interval H&P Note (Signed)
History and Physical Interval Note:  10/11/2012 7:45 AM  Sheri Hernandez  has presented today for surgery, with the diagnosis of Lung Mass  The various methods of treatment have been discussed with the patient and family. After consideration of risks, benefits and other options for treatment, the patient has consented to  Procedure(s) (LRB) with comments: VIDEO BRONCHOSCOPY WITH FLUORO (Bilateral) as a surgical intervention .  The patient's history has been reviewed, patient examined, no change in status, stable for surgery.  I have reviewed the patient's chart and labs.  Questions were answered to the patient's satisfaction.  She understands the risks of bleeding, pneumothorax, respiratory failure.   Barbaraann Share

## 2012-10-11 NOTE — Op Note (Signed)
NAMESHAGUN, WORDELL              ACCOUNT NO.:  [id number]  MEDICAL RECORD NO.:  [id number]  LOCATION:  RESP                         FACILITY:  [facility]  PHYSICIAN:  Barbaraann Share, MD,FCCPDATE OF BIRTH:  11/27/1955  DATE OF PROCEDURE:  10/11/2012 DATE OF DISCHARGE:                              OPERATIVE REPORT   PROCEDURE:  Video bronchoscopy with biopsy.  SURGEON:  Barbaraann Share, MD,FCCP.  ANESTHESIA:  Versed 10 mg, Demerol 50 mg, topical 1% lidocaine to the vocal cords and airways during the procedure.  DESCRIPTION:  After obtaining informed consent and under close cardiopulmonary monitoring, the above preop anesthesia was given and fiberoptic scope was passed to the right naris and into the posterior pharynx where there was no lesions or other abnormalities seen.  The vocal cords appeared to be within normal limits and moved bilaterally with phonation.  The scope was then passed into the trachea where it was examined along its entire length down to the level of the carina.  The trachea appeared normal; however, the carina appeared to be bulging with anatomic distortion.  The left tracheobronchial tree was examined serially except subsegmental level with no endobronchial abnormality being found.  Attention was then paid to the right tracheobronchial tree where the right upper lobe appeared to be normal; however, the bronchus intermedius was almost 100% occluded with significant extrinsic compression and also necrotic debris.  With great effort, the necrotic debris was removed, and there was clearly an extrinsic compression narrowing the bronchus intermedius at least 90+%.  The scope was unable to be passed beyond the orifice.  Bronchial washes were done from this area, and then, transbronchial needle aspirations were taken from the area of extrinsic compression with good specimens being obtained. However, there was fairly significant bleeding from the biopsies, but good  hemostasis was maintained.  On the last transbronchial needle aspiration, the Wang catheter was advanced and the needle was unable to be retracted completely; there was no way to get this back again or through the scope.  Therefore, the bronchoscope with a portion of the needle remaining exposed was removed en bloc without complication. Visual inspection of the right tracheobronchial tree, trachea, vocal cords, and posterior pharynx showed no evidence for trauma.  When the scope was reintroduced, there appeared to be a tissue mass lying at the origin of the bronchus intermedius on top of the area of extrinsic compression.  This was retrieved with biopsy forceps and placed in formalin for further evaluation.  A repeat inspection of the airway showed good hemostasis with no complications.  The patient remained hemodynamically stable with excellent O2 saturation throughout the procedure.  A chest x-ray postprocedure showed no pneumothorax or other acute abnormality.    Barbaraann Share, MD,FCCP    [facility]/MED[facility]  D:  10/11/2012  T:  10/11/2012  Job:  [id number]

## 2012-10-12 ENCOUNTER — Encounter (HOSPITAL_COMMUNITY): Payer: Self-pay | Admitting: Pulmonary Disease

## 2012-10-14 LAB — CULTURE, BAL-QUANTITATIVE W GRAM STAIN: Special Requests: NORMAL

## 2012-10-15 ENCOUNTER — Other Ambulatory Visit: Payer: Self-pay | Admitting: Pulmonary Disease

## 2012-10-15 DIAGNOSIS — R918 Other nonspecific abnormal finding of lung field: Secondary | ICD-10-CM

## 2012-10-15 DIAGNOSIS — N2889 Other specified disorders of kidney and ureter: Secondary | ICD-10-CM

## 2012-10-16 ENCOUNTER — Telehealth: Payer: Self-pay | Admitting: *Deleted

## 2012-10-16 MED ORDER — LEVOFLOXACIN 750 MG PO TABS: 750.0000 mg | ORAL_TABLET | Freq: Every day | ORAL | Status: DC

## 2012-10-16 NOTE — Telephone Encounter (Signed)
Informed husband that rx has been called to [facility] [address]

## 2012-10-16 NOTE — Telephone Encounter (Signed)
Levaquin 750mg  1 po qd x 7 days [facility] [address] [place].   Left message on husband voicemail to return call.

## 2012-10-17 ENCOUNTER — Encounter (HOSPITAL_COMMUNITY): Payer: Self-pay | Admitting: Pharmacy Technician

## 2012-10-19 ENCOUNTER — Other Ambulatory Visit: Payer: Self-pay | Admitting: Radiology

## 2012-10-23 ENCOUNTER — Ambulatory Visit (HOSPITAL_COMMUNITY)
Admission: RE | Admit: 2012-10-23 | Discharge: 2012-10-23 | Disposition: A | Discharge status: Home / Self Care | Payer: 59 | Source: Ambulatory Visit | Attending: Pulmonary Disease | Admitting: Pulmonary Disease

## 2012-10-23 ENCOUNTER — Encounter (HOSPITAL_COMMUNITY): Payer: Self-pay

## 2012-10-23 DIAGNOSIS — R000 Tachycardia, unspecified: Secondary | ICD-10-CM | POA: Insufficient documentation

## 2012-10-23 DIAGNOSIS — R911 Solitary pulmonary nodule: Secondary | ICD-10-CM | POA: Insufficient documentation

## 2012-10-23 DIAGNOSIS — N2889 Other specified disorders of kidney and ureter: Secondary | ICD-10-CM

## 2012-10-23 DIAGNOSIS — R599 Enlarged lymph nodes, unspecified: Secondary | ICD-10-CM | POA: Insufficient documentation

## 2012-10-23 DIAGNOSIS — R05 Cough: Secondary | ICD-10-CM | POA: Insufficient documentation

## 2012-10-23 DIAGNOSIS — Z79899 Other long term (current) drug therapy: Secondary | ICD-10-CM | POA: Insufficient documentation

## 2012-10-23 DIAGNOSIS — I10 Essential (primary) hypertension: Secondary | ICD-10-CM | POA: Insufficient documentation

## 2012-10-23 DIAGNOSIS — R918 Other nonspecific abnormal finding of lung field: Secondary | ICD-10-CM

## 2012-10-23 DIAGNOSIS — C649 Malignant neoplasm of unspecified kidney, except renal pelvis: Secondary | ICD-10-CM | POA: Insufficient documentation

## 2012-10-23 DIAGNOSIS — R059 Cough, unspecified: Secondary | ICD-10-CM | POA: Insufficient documentation

## 2012-10-23 DIAGNOSIS — R0602 Shortness of breath: Secondary | ICD-10-CM | POA: Insufficient documentation

## 2012-10-23 HISTORY — DX: Essential (primary) hypertension: I10

## 2012-10-23 LAB — CBC
Hemoglobin: 12.2 g/dL (ref 12.0–15.0)
MCH: 27.4 pg (ref 26.0–34.0)
RBC: 4.46 MIL/uL (ref 3.87–5.11)

## 2012-10-23 LAB — PROTIME-INR: Prothrombin Time: 13.4 seconds (ref 11.6–15.2)

## 2012-10-23 MED ORDER — FENTANYL CITRATE 0.05 MG/ML IJ SOLN
INTRAMUSCULAR | Status: AC | PRN
  Administered 2012-10-23: 100 ug via INTRAVENOUS
  Administered 2012-10-23: 50 ug via INTRAVENOUS

## 2012-10-23 MED ORDER — MIDAZOLAM HCL 2 MG/2ML IJ SOLN
INTRAMUSCULAR | Status: AC | PRN
  Administered 2012-10-23: 1 mg via INTRAVENOUS
  Administered 2012-10-23: 2 mg via INTRAVENOUS

## 2012-10-23 MED ORDER — SODIUM CHLORIDE 0.9 % IV SOLN: INTRAVENOUS | Status: DC

## 2012-10-23 MED ORDER — MIDAZOLAM HCL 2 MG/2ML IJ SOLN
INTRAMUSCULAR | Status: AC
  Filled 2012-10-23: qty 6

## 2012-10-23 MED ORDER — HYDROCODONE-ACETAMINOPHEN 5-325 MG PO TABS
1.0000 | ORAL_TABLET | ORAL | Status: DC | PRN
  Administered 2012-10-23: 1 via ORAL
  Filled 2012-10-23: qty 1
  Filled 2012-10-23: qty 2

## 2012-10-23 MED ORDER — FENTANYL CITRATE 0.05 MG/ML IJ SOLN
INTRAMUSCULAR | Status: AC
  Filled 2012-10-23: qty 6

## 2012-10-23 NOTE — H&P (Signed)
Sheri Hernandez is an 57 y.o. female.   Chief Complaint: "I'm having a biopsy today" HPI: Patient with history of hypermetabolic bilateral lung nodules, s/p recent bronchoscopy with few atypical cells noted on pathology, as well as hypermetabolic left renal mass. She presents today for CT guided left renal mass biopsy.  Past Medical History  Diagnosis Date  . Papilloma of breast     left  . Anxiety   . Complication of anesthesia     hard time waking up 20 yrs. ago when having wisdon teeth removed  . Cancer   . Hypertension     Past Surgical History  Procedure Laterality Date  . Tonsillectomy  1967  . Bronchial brush biopsy    . Wids    . Wisdom tooth extraction    . Neck lesion biopsy    . Breast biopsy  08/18/2011    Procedure: BREAST BIOPSY WITH NEEDLE LOCALIZATION;  Surgeon: Robyne Askew, MD;  Location: [facility] OR;  Service: General;  Laterality: Left;  left breast biopsy with needle localization  . Video bronchoscopy  10/11/2012    Procedure: VIDEO BRONCHOSCOPY WITH FLUORO;  Surgeon: Barbaraann Share, MD;  Location: [facility];  Service: Cardiopulmonary;  Laterality: Bilateral;    Family History  Problem Relation Age of Onset  . Heart disease Mother   . Heart disease Father     heart attack  . Heart disease Brother   . Cancer Maternal Aunt     colon, breast  . Cancer Maternal Uncle     skin, stomach  . Heart disease Maternal Grandmother    Social History:  reports that she quit smoking about 5 weeks ago. Her smoking use included Cigarettes. She has a 60 pack-year smoking history. She has never used smokeless tobacco. She reports that  drinks alcohol. She reports that she does not use illicit drugs.  Allergies: No Known Allergies  Current outpatient prescriptions:acetaminophen (TYLENOL) 500 MG tablet, Take 500 mg by mouth every 6 (six) hours as needed for pain. , Disp: , Rfl: ;  aspirin EC 81 MG tablet, Take 81 mg by mouth daily., Disp: , Rfl: ;  benzonatate (TESSALON) 100  MG capsule, Take 100 mg by mouth 3 (three) times daily as needed for cough. , Disp: , Rfl: ;  hydrochlorothiazide (MICROZIDE) 12.5 MG capsule, Take 12.5 mg by mouth daily before breakfast. , Disp: , Rfl:  HYDROcodone-homatropine (HYCODAN) 5-1.5 MG/5ML syrup, Take 5 mLs by mouth every 6 (six) hours as needed for cough., Disp: 180 mL, Rfl: 0;  levofloxacin (LEVAQUIN) 750 MG tablet, Take 750 mg by mouth daily., Disp: , Rfl:  Current facility-administered medications:0.9 %  sodium chloride infusion, , Intravenous, Continuous, Brayton El, PA;  fentaNYL (SUBLIMAZE) 0.05 MG/ML injection, , , , ;  midazolam (VERSED) 2 MG/2ML injection, , , ,    Results for orders placed during the hospital encounter of 10/23/12 (from the past 48 hour(s))  APTT     Status: None   Collection Time    10/23/12 11:28 AM      Result Value Range   aPTT 26  24 - 37 seconds  CBC     Status: None   Collection Time    10/23/12 11:28 AM      Result Value Range   WBC 8.5  4.0 - 10.5 K/uL   RBC 4.46  3.87 - 5.11 MIL/uL   Hemoglobin 12.2  12.0 - 15.0 g/dL   HCT 16.1  09.6 - 04.5 %  MCV 81.4  78.0 - 100.0 fL   MCH 27.4  26.0 - 34.0 pg   MCHC 33.6  30.0 - 36.0 g/dL   RDW 16.1  09.6 - 04.5 %   Platelets 400  150 - 400 K/uL  PROTIME-INR     Status: None   Collection Time    10/23/12 11:28 AM      Result Value Range   Prothrombin Time 13.4  11.6 - 15.2 seconds   INR 1.03  0.00 - 1.49   No results found.  Review of Systems  Constitutional: Negative for fever.  Respiratory: Positive for cough and shortness of breath.   Cardiovascular:       Occ chest discomfort ("pinching" sensation)  Gastrointestinal: Negative for nausea, vomiting and abdominal pain.  Musculoskeletal: Negative for back pain.  Neurological: Negative for headaches.  Psychiatric/Behavioral: The patient is nervous/anxious.     Blood pressure 156/76, pulse 103, temperature 97.6 F (36.4 C), resp. rate 20, SpO2 96.00%. Physical Exam  Constitutional:  She is oriented to person, place, and time. She appears well-developed and well-nourished.  Cardiovascular: Regular rhythm.   tachycardic  Respiratory: Effort normal and breath sounds normal.  GI: Soft. Bowel sounds are normal. There is no tenderness.  Musculoskeletal: Normal range of motion. She exhibits no edema.  Neurological: She is alert and oriented to person, place, and time.     Assessment/Plan Pt with hypermetabolic bilateral lung nodules and left renal mass on recent PET scan . Plan is for CT guided left renal mass biopsy today. Details/risks of procedure d/w pt/pt's husband with their understanding and consent.  Kataryna Mcquilkin,D KEVIN 10/23/2012, 12:56 PM

## 2012-10-23 NOTE — Procedures (Signed)
Procedure:  CT guided core biopsy of left renal mass Findings:  18 G core biopsy samples x 3 of large left renal mass via 17 G needle.  Gelfoam pledgets injected on completion.

## 2012-10-23 NOTE — H&P (Signed)
Agree.  For multiple reasons, I feel that renal biopsy would be safer today.  Plan core biopsy of left renal mass.

## 2012-10-26 ENCOUNTER — Telehealth: Payer: Self-pay | Admitting: Pulmonary Disease

## 2012-10-26 DIAGNOSIS — C649 Malignant neoplasm of unspecified kidney, except renal pelvis: Secondary | ICD-10-CM

## 2012-10-26 DIAGNOSIS — N2889 Other specified disorders of kidney and ureter: Secondary | ICD-10-CM

## 2012-10-26 NOTE — Telephone Encounter (Signed)
Spouse called back returning MW call back [phone] stated please leave a msg if he doesn't answer works in a factory very loud may not here phone ring but will call back wants to be the one to give his wife the results

## 2012-10-26 NOTE — Telephone Encounter (Signed)
Gave husband dx - needs referral to oncology - they have already been contacted by dana herndon

## 2012-10-26 NOTE — Telephone Encounter (Signed)
Spoke with pt's spouse He is very upset that the pt has not been called her her ct bx results I advised that he have not received anything yet, and either way KC is not in the office He demands that he get results today I advised that I will call for them, and then get the doc on call to look and them if he is comfortable with this Will await fax

## 2012-10-26 NOTE — Telephone Encounter (Signed)
LMTCB

## 2012-10-26 NOTE — Telephone Encounter (Signed)
I will send referral for oncology  Will forward to [facility] so that he is aware

## 2012-10-26 NOTE — Telephone Encounter (Signed)
MW agreed to review so will forward msg to him Thanks!!!!

## 2012-10-29 ENCOUNTER — Telehealth: Payer: Self-pay | Admitting: Oncology

## 2012-10-29 ENCOUNTER — Encounter: Payer: Self-pay | Admitting: *Deleted

## 2012-10-29 ENCOUNTER — Encounter: Payer: Self-pay | Admitting: Pulmonary Disease

## 2012-10-29 ENCOUNTER — Telehealth: Payer: Self-pay | Admitting: Pulmonary Disease

## 2012-10-29 MED ORDER — LEVOFLOXACIN 750 MG PO TABS: 750.0000 mg | ORAL_TABLET | Freq: Every day | ORAL | Status: DC

## 2012-10-29 MED ORDER — BENZONATATE 100 MG PO CAPS: 200.0000 mg | ORAL_CAPSULE | Freq: Four times a day (QID) | ORAL | Status: DC | PRN

## 2012-10-29 NOTE — Telephone Encounter (Signed)
Pt aware of this. Pt to call Oncologist herself to follow up on referral.  [phone]  Pt now asking about the status on possible lung cancer--states that biopsy returned inconclusive and patient is wanting to know where we go from here and what is now being done in regards to her lungs.   Please advise Dr Shelle Iron, thanks.

## 2012-10-29 NOTE — Progress Notes (Signed)
Noted path of renal cell carcinoma form CT biopsy.  Notified Tiff for referral to [facility]

## 2012-10-29 NOTE — Telephone Encounter (Signed)
Pt called and c/o increased wheezing and prod cough yellow-Sheri Hernandez; "pumpkin orange" since being off of the Levaquin 750 abx. Patient is requesting that another Rx be sent to [facility] [place], [place]    Pt is also anxious about her referral to Kidney specialist---pt states that she is wanting to have this pushed through a little faster. She is becoming more anxious waiting and wanting this appt set up a little faster.   Will send this message to [facility] to follow up on the Rx for the abx. Will also send this message to [facility] to follow up on the referral.

## 2012-10-29 NOTE — Telephone Encounter (Signed)
Pt states she is calling us back. Leanora Ivanoff

## 2012-10-29 NOTE — Telephone Encounter (Signed)
atc multiple times after being d/c -- unable to reach-kept going into voicemail.  Spoke with husband and he is going to get in touch with his wife and have her give [place] a call.  Husband is aware of KC being out of office until this afternoon. Will have her call back as soon as possible to speak with a nurse.

## 2012-10-29 NOTE — Telephone Encounter (Signed)
Referral was faxed tover to oncology on fri 10/26/12 they will call pt with this appt Tobe Sos

## 2012-10-29 NOTE — Telephone Encounter (Signed)
Pt having return of cough after finishing abx but also has renal cell mets in lung with one causing significant airway compression.  She has not smoked in one month. Will send in prescription for extension of her abx for 5 days more since she did grow staph. She can also continue tessalon pearls for cough suppression.  She has apptm with oncologist in am for her renal cell cancer.   Sheri Hernandez: Please call in levaquin 750mg  one a day for 5 more days Can call in tessalon pearls 100mg   Take 2 every 6 hrs if needed for cough  #30, one fill.

## 2012-10-29 NOTE — Telephone Encounter (Signed)
Per KC--  Please call in levaquin 750mg  one a day for 5 more days  Can call in tessalon pearls 100mg  Take 2 every 6 hrs if needed for cough #30, one fill.  [facility] [address] [place]  Also Patient has appt with Oncologist in the am for her renal cell cancer.

## 2012-10-29 NOTE — Telephone Encounter (Signed)
S/W pt husband in re NP appt 02/25 @ 1:30 w/Dr. Gaylyn Rong Referring Dr. Lynnea Ferrier Dx-Renal Mass/Multi Lung mass Welcome packet at registration.

## 2012-10-29 NOTE — Telephone Encounter (Signed)
rx sent. Safiya Girdler, CMA  

## 2012-10-30 ENCOUNTER — Other Ambulatory Visit (HOSPITAL_BASED_OUTPATIENT_CLINIC_OR_DEPARTMENT_OTHER): Payer: 59 | Admitting: Lab

## 2012-10-30 ENCOUNTER — Telehealth: Payer: Self-pay | Admitting: Oncology

## 2012-10-30 ENCOUNTER — Encounter: Payer: Self-pay | Admitting: Oncology

## 2012-10-30 ENCOUNTER — Encounter: Payer: Self-pay | Admitting: *Deleted

## 2012-10-30 ENCOUNTER — Ambulatory Visit: Payer: 59

## 2012-10-30 ENCOUNTER — Ambulatory Visit (HOSPITAL_BASED_OUTPATIENT_CLINIC_OR_DEPARTMENT_OTHER): Payer: 59 | Admitting: Oncology

## 2012-10-30 DIAGNOSIS — R222 Localized swelling, mass and lump, trunk: Secondary | ICD-10-CM

## 2012-10-30 DIAGNOSIS — C649 Malignant neoplasm of unspecified kidney, except renal pelvis: Secondary | ICD-10-CM

## 2012-10-30 DIAGNOSIS — R918 Other nonspecific abnormal finding of lung field: Secondary | ICD-10-CM

## 2012-10-30 LAB — COMPREHENSIVE METABOLIC PANEL (CC13)
Albumin: 3.2 g/dL — ABNORMAL LOW (ref 3.5–5.0)
BUN: 15.8 mg/dL (ref 7.0–26.0)
CO2: 28 mEq/L (ref 22–29)
Calcium: 10.1 mg/dL (ref 8.4–10.4)
Chloride: 102 mEq/L (ref 98–107)
Glucose: 103 mg/dl — ABNORMAL HIGH (ref 70–99)
Potassium: 3.7 mEq/L (ref 3.5–5.1)

## 2012-10-30 LAB — CBC WITH DIFFERENTIAL/PLATELET
Basophils Absolute: 0.2 10*3/uL — ABNORMAL HIGH (ref 0.0–0.1)
Eosinophils Absolute: 0.3 10*3/uL (ref 0.0–0.5)
HGB: 12.4 g/dL (ref 11.6–15.9)
MCV: 79.6 fL (ref 79.5–101.0)
MONO#: 0.7 10*3/uL (ref 0.1–0.9)
MONO%: 8.2 % (ref 0.0–14.0)
NEUT#: 5.3 10*3/uL (ref 1.5–6.5)
RDW: 14.2 % (ref 11.2–14.5)
lymph#: 1.9 10*3/uL (ref 0.9–3.3)

## 2012-10-30 LAB — LACTATE DEHYDROGENASE (CC13): LDH: 122 U/L — ABNORMAL LOW (ref 125–245)

## 2012-10-30 NOTE — Progress Notes (Signed)
[facility] Comprehensive Psychosocial Assessment Clinical Social Work  Clinical Social Work was referred by Systems developer for assessment.  Clinical Social Worker met with patient in exam room to assess psychosocial, emotional, mental health, and spiritual needs of the patient.  Sheri Hernandez was alone at time of visit and appeared somewhat disheveled and tearful. Clinical Child psychotherapist introduced herself and explained CSW role at [facility].  The patient was very pleasant, however, stated she felt "sad that she was going to die".  The patient had difficulty making eye contact with CSW and answering simple questions (such as, 'where do you live?', 'when did you first learn you had cancer?', etc).  Patient appeared anxious and overwhelmed.  Patient's knowledge about cancer and its treatment including level of understanding, reactions, goals for care, and expectations:  CSW requested patient share her current understanding of her illness after meeting with MD today. Patient became very tearful and shared that she heard, "she's going to die" and "there is no hope".  CSW and patient explored patient's perception and discussed cure rate and survival rate definitions.  CSW shared with Sheri Hernandez, how she has worked with many patients who have experienced a positive quality of life, despite their cancer.  CSW provided emotional support and validated patient's feelings.  Characteristics of the patient's support system:  The patient identifies her spouse as her primary support person and shares she does not have a strong network of friends and family in this area.  She stated they moved to this area from [place].  CSW explored patient's spirituality, but patient unable to process her spirituality at this time.  Patient and family psychosocial functioning including strengths, limitations, and coping skills:  CSW met with patient's spouse briefly after visit.  Patient's spouse, Sheri Hernandez, states that he feels he has a grasp  on this situation and that his wife has not yet been able to grasp the information they are hearing.  At this time, CSW unable to identify existing positive coping skills due to patient's inability to focus. CSW will explore additional strengths, limitations, and coping skills at a later time.    Identifications of barriers to care:  Limited amount of friend/family support.  Availability of community resources: none identified at this time  Clinical Social Worker follow up needed: yes  If yes, follow up plan: CSW and patient agreed to meet at a later time.  CSW suggested patient have time to process current discussion and would like to follow up with patient to provide further support.  Sheri Hernandez is interested in speaking with a cancer survivor who has been through a similar situation. CSW will look for mentor resource, but has strongly advised patient also participate in counseling to help cope with adjustment to cancer diagnosis and fear of dying. Patient may benefit from cognitive reframing, cognitive modification, and develop new coping mechanisms.  Sheri Hernandez is not agreeable to counseling at this time but has agreed to discuss with CSW at another time. If patient is interested at a later time, CSW will refer to [facility] Doctor[facility] counseling interns.    Kathrin Penner, MSW, LCSW Clinical Social Worker [facility] [phone]

## 2012-10-30 NOTE — Progress Notes (Signed)
Checked in new patient. No financial issues. °

## 2012-10-30 NOTE — Patient Instructions (Addendum)
1.  Diagnosis:  Biopsy proven kidney cancer.  2.  Stage: suspected metastatic disease to the lungs. 3.  Treatment:  - Normally no role for removing the kidney when cancer has spread to distant organs.  However, in certain patients with low bulk of disease outside the kidneys, it may help with subsequent therapy.   We may consider referral to Urology.  - Multiple different therapies, mainly molecular target therapies depending of risk categorization (pending lab test today for this).  *  Il-2 (interleukin-2) given at [facility] or [place].  Toxic therapy, requiring long ICU stay; but may have 5% chance of long remission  *  Targeted therapy:  pazopanib vs. temsirolimus vs. Avastin.

## 2012-10-30 NOTE — Telephone Encounter (Signed)
gv and printed pt appt schedule for Feb and march...S.w. Robin and she will schedule pt appt once Dr. Lodema Pilot nots are faxed over.Marland KitchenMarland KitchenLaurel Dimmer has referral for [facility]...gv [facility] Echo referral needs to be precerted b4 scheduled Bonita Quin will schedule echo....pt aware cs will contact with d/t of ct mr

## 2012-10-30 NOTE — Progress Notes (Signed)
[facility]  Telephone:(336) [phone] Fax:(336) [phone]   MEDICAL ONCOLOGY - INITIAL CONSULATION    Referral MD  Reason for Referral:  Dr. Carolin Sicks, M.D.    HPI:  [name] is a 57 year-old woman with no significant past medical history.    09/2012; cough, progressive.  PCP;  Antibiotic x 1 week; no resolution. CXR showed lung masses.  This was confirmed on chest  CT scan.  PET scan showed uptake in the bilateral lung masses, right hilar mass, and also left renal mass. Dr. Shelle Iron performed bronchoscopy with nondiagnostic path.  She then underwent eval by IR for biopsy of left kidney with pathology consistent with high grade clear cell renal cell carcinoma.  She was kindly referred to the [facility] for evaluation.  [name] presented to the clinic today for the first time with her husband.  She still has morning hacking cough.  After she cough up stringy phlegm in the morning, her cough eases up for the rest of the day.  She also has cough meds which are helping her.  She denied fever, fatigue, headache, anorexia, hemoptysis, hematuria, flank pain.  The rest of the 14-point review of system was negative.   Of note, she said that when she was 57 year-old, she had mediastinal mass and lung masses.  Biopsy by [facility] was negative.  However, she was given a diagnosis of sarcoidosis.  There is no available report to confirm this.    Past Medical History  Diagnosis Date  . Papilloma of breast 2012    left. lumpectomy  . Anxiety   . Complication of anesthesia     hard time waking up 20 yrs. ago when having wisdon teeth removed  . Hypertension   :  Past Surgical History  Procedure Laterality Date  . Tonsillectomy  1967  . Bronchial brush biopsy    . Wids    . Wisdom tooth extraction    . Neck lesion biopsy    . Breast biopsy  08/18/2011    Procedure: BREAST BIOPSY WITH NEEDLE LOCALIZATION;  Surgeon: Robyne Askew, MD;  Location: [facility] OR;   Service: General;  Laterality: Left;  left breast biopsy with needle localization  . Video bronchoscopy  10/11/2012    Procedure: VIDEO BRONCHOSCOPY WITH FLUORO;  Surgeon: Barbaraann Share, MD;  Location: [facility];  Service: Cardiopulmonary;  Laterality: Bilateral;  :  Current Outpatient Prescriptions  Medication Sig Dispense Refill  . acetaminophen (TYLENOL) 500 MG tablet Take 500 mg by mouth every 6 (six) hours as needed for pain.       Marland Kitchen aspirin EC 81 MG tablet Take 81 mg by mouth daily.      . benzonatate (TESSALON) 100 MG capsule Take 2 capsules (200 mg total) by mouth every 6 (six) hours as needed for cough.  30 capsule  0  . hydrochlorothiazide (MICROZIDE) 12.5 MG capsule Take 12.5 mg by mouth daily before breakfast.       . HYDROcodone-homatropine (HYCODAN) 5-1.5 MG/5ML syrup Take 5 mLs by mouth every 6 (six) hours as needed for cough.  180 mL  0  . levofloxacin (LEVAQUIN) 750 MG tablet Take 1 tablet (750 mg total) by mouth daily.  5 tablet  0   No current facility-administered medications for this visit.     No Known Allergies:  Family History  Problem Relation Age of Onset  . Heart disease Mother   . Heart disease  Father     heart attack  . Heart disease Brother   . Stroke Brother   . Cancer Maternal Aunt     colon, breast in different aunts/uncles  . Cancer Maternal Uncle     skin, stomach  . Heart disease Maternal Grandmother   . Cancer Maternal Grandfather     cancer  :  History   Social History  . Marital Status: Married    Spouse Name: N/A    Number of Children: 0  . Years of Education: N/A   Occupational History  . 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    Social History Main Topics  . Smoking status: Former Smoker -- 1.50 packs/day for 40 years    Types: Cigarettes    Quit date: 09/18/2012  . Smokeless tobacco: Never Used     Comment: PT STATES THAT SHE QUIT SMOKING 09/27/11. PATIENT STATES THAT SHE HAS NOT "INHALED" IN LAST 4 YEARS.   Marland Kitchen  Alcohol Use: Yes     Comment: OCC  . Drug Use: No  . Sexually Active: Not on file   Other Topics Concern  . Not on file   Social History Narrative  . No narrative on file  :    Exam: ECOG 0.   General:  well-nourished woman, in no acute distress.  Eyes:  no scleral icterus.  ENT:  There were no oropharyngeal lesions.  Neck was without thyromegaly.  Lymphatics:  Negative cervical, supraclavicular or axillary adenopathy.  Respiratory: lungs were clear bilaterally without wheezing or crackles.  Cardiovascular:  Regular rate and rhythm, S1/S2, without murmur, rub or gallop.  There was no pedal edema.  GI:  abdomen was soft, flat, nontender, nondistended, without organomegaly.  Muscoloskeletal:  no spinal tenderness of palpation of vertebral spine. There was no flank pain.  Skin exam was without echymosis, petichae.  Neuro exam was nonfocal.  Patient was able to get on and off exam table without assistance.  Gait was normal.  Patient was alert and oriented.  Attention was good.   Language was appropriate.  Mood was every anxious.  Speech was not pressured.  Thought content was not tangential.     Lab Results  Component Value Date   WBC 8.3 10/30/2012   HGB 12.4 10/30/2012   HCT 36.3 10/30/2012   PLT 384 10/30/2012   GLUCOSE 103* 10/30/2012   ALT 13 10/30/2012   AST 11 10/30/2012   NA 140 10/30/2012   K 3.7 10/30/2012   CL 102 10/30/2012   CREATININE 0.8 10/30/2012   BUN 15.8 10/30/2012   CO2 28 10/30/2012   IMAGING:  I personally reviewed this PET scan and showed the images to the patient's husband.  She did not want to see any path report, radiology report or imaging.   Nm Pet Image Initial (pi) Skull Base To Thigh  10/05/2012  *RADIOLOGY REPORT*  Clinical Data: Initial treatment strategy for lung mass. Large renal mass on the left.  NUCLEAR MEDICINE PET SKULL BASE TO THIGH  Fasting Blood Glucose:  103  Technique:  19.1 mCi F-18 FDG was injected intravenously. CT data was obtained and used for  attenuation correction and anatomic localization only.  (This was not acquired as a diagnostic CT examination.) Additional exam technical data entered on technologist worksheet.  Comparison:  CT 09/28/2012  Findings:  Neck: No hypermetabolic nodes in the neck.  Chest:  There are bilateral hypermetabolic pulmonary masses and nodules.  Example mass lesion at the  left lung base measures 30 mm (image 99) with SUV max = 16.7.  Hypermetabolic nodule at the right lung base measures 21 mm SUV max = 10.4.  There is a large hypermetabolic  mass adjacent to and compressing the right lower lobe bronchus measuring 37 x 46 mm with SUV max = 14.5.  Abdomen/Pelvis:  Large cystic and solid mass extending from the left kidney.  The solid components are  hypermetabolic.  For example anterior 7 cm solid component (image 129) has SUV max = 18.0.  There is no hypermetabolic adenopathy in the abdomen pelvis.  Skeleton:  No convincing evidence of skeletal metastasis.  Several pleural hypermetabolic foci adjacent to ribs  IMPRESSION:  1.  Intense hypermetabolic cystic and solid mass associated with the left kidney most consistent with renal cell carcinoma. 2.  Hypermetabolic right infrahilar mass compressing the right lower lobe bronchus. 3.  Bilateral hypermetabolic nodules and masses consistent with metastasis.     Assessment and Plan:    1.  Diagnosis:  Biopsy proven kidney cancer.  2.  Stage: suspected metastatic disease to the lungs.   3.  Work up:   * Pretest probability is very high that she has lung met.  However, given her history of sarcoidosis, I cannot be 100% sure without tissue biopsy.  I referred patient to IR.  Dr. Lindie Spruce expressed concern of potential hemothorax given the vascular nature of RCC.  I asked a favor from Dr. Lindie Spruce to discuss with patient and her husband the pros and cons of lung biopsy and leave it up to patient to decide if she would like to pursue lung biopsy.  4.  Prognosis:  Pending lab test to  risk stratify.  I tried to explain to patient that not all metastatic RCC behave the same way.  There are cases of stage IV RCC with slow progression.  She was very upset hearing the words stage IV and metastasis.  5.  Treatment Outline:  - Normally no role for primary resection of cancer when cancer has spread to distant organs.  However, in certain patients with low bulk of disease outside the kidneys, it may help with subsequent therapy (especialy Il-2).   We reccommended referral to Urology.  - Multiple different therapies, mainly molecular target therapies depending of risk categorization (pending lab test today for this).  *  Il-2 (interleukin-2) given at [facility] or [facility].  Toxic therapy, requiring long ICU stay; but may have 5% chance of long remission ("cure").  Her husband requested referral to [facility].  I made this referral.  To ensure that she is a candidate for Il-2 therapy, I referred her to brain MRI to rule out brain met, 2D echo and PFT to assess cardiopulmonary functions.   *  If she is not a candidate or refuse Il-2 therapy; the following examples of targeted therapy against VEGF are recommended:   pazopanib vs. temsirolimus vs. Avastin.   6.  Anxiety:  I offered to start her on antidepressant.  She did not want this.  I referred her to SW for further emotional support than what I provided in clinic today.   7.  Follow up:  In about 3 weeks to summarize work up and finalize treatment.    The length of time of the face-to-face encounter was 60 minutes. More than 50% of time was spent counseling and coordination of care.     Thank you for this referral.

## 2012-11-01 ENCOUNTER — Other Ambulatory Visit: Payer: Self-pay | Admitting: Oncology

## 2012-11-01 ENCOUNTER — Telehealth: Payer: Self-pay | Admitting: *Deleted

## 2012-11-01 ENCOUNTER — Ambulatory Visit (HOSPITAL_COMMUNITY)
Admission: RE | Admit: 2012-11-01 | Discharge: 2012-11-01 | Disposition: A | Discharge status: Home / Self Care | Payer: 59 | Source: Ambulatory Visit | Attending: Oncology | Admitting: Oncology

## 2012-11-01 ENCOUNTER — Encounter: Payer: Self-pay | Admitting: Oncology

## 2012-11-01 ENCOUNTER — Telehealth: Payer: Self-pay | Admitting: Oncology

## 2012-11-01 DIAGNOSIS — R0609 Other forms of dyspnea: Secondary | ICD-10-CM | POA: Insufficient documentation

## 2012-11-01 DIAGNOSIS — R062 Wheezing: Secondary | ICD-10-CM | POA: Insufficient documentation

## 2012-11-01 DIAGNOSIS — J988 Other specified respiratory disorders: Secondary | ICD-10-CM | POA: Insufficient documentation

## 2012-11-01 DIAGNOSIS — R0989 Other specified symptoms and signs involving the circulatory and respiratory systems: Secondary | ICD-10-CM | POA: Insufficient documentation

## 2012-11-01 DIAGNOSIS — C3490 Malignant neoplasm of unspecified part of unspecified bronchus or lung: Secondary | ICD-10-CM | POA: Insufficient documentation

## 2012-11-01 DIAGNOSIS — F17200 Nicotine dependence, unspecified, uncomplicated: Secondary | ICD-10-CM | POA: Insufficient documentation

## 2012-11-01 DIAGNOSIS — C649 Malignant neoplasm of unspecified kidney, except renal pelvis: Secondary | ICD-10-CM

## 2012-11-01 DIAGNOSIS — F32A Depression, unspecified: Secondary | ICD-10-CM

## 2012-11-01 DIAGNOSIS — R918 Other nonspecific abnormal finding of lung field: Secondary | ICD-10-CM

## 2012-11-01 LAB — PULMONARY FUNCTION TEST

## 2012-11-01 MED ORDER — ESCITALOPRAM OXALATE 10 MG PO TABS: 10.0000 mg | ORAL_TABLET | Freq: Every day | ORAL | Status: DC

## 2012-11-01 MED ORDER — ALBUTEROL SULFATE (5 MG/ML) 0.5% IN NEBU
2.5000 mg | INHALATION_SOLUTION | Freq: Once | RESPIRATORY_TRACT | Status: AC
  Administered 2012-11-01: 2.5 mg via RESPIRATORY_TRACT

## 2012-11-01 NOTE — Telephone Encounter (Signed)
I can prescribe Lexapro for antidepressant.  However, it is not for anxiety. Please have Lauren set her up to see a psychiatrist ASAP.  Thanks.

## 2012-11-01 NOTE — Telephone Encounter (Signed)
Pt appt.with Dr. Tomasita Morrow @ [facility] 11/13/12@2 :30. Medical records faxed, scans and slides will be fedex'ed. Pt is aware

## 2012-11-01 NOTE — Telephone Encounter (Signed)
Patient has already had her Consult with Oncology. Dr Shelle Iron has spoke with husband and given update on care. Nothing further needed.

## 2012-11-01 NOTE — Telephone Encounter (Signed)
Message given to Kathrin Penner for psychiatric referral.  She will speak to husband and inform of lexapro ordered and recommendation for psychiatrist to treat anxiety.

## 2012-11-01 NOTE — Telephone Encounter (Signed)
Faxed pt medical records to [facility] °

## 2012-11-01 NOTE — Telephone Encounter (Signed)
Sheri Hernandez has this been taking care of? Please advise thanks

## 2012-11-01 NOTE — Telephone Encounter (Signed)
VM from [employer].  She says husband is asking if Dr. Gaylyn Rong can prescribe pt something for anxiety? He says Dr. Gaylyn Rong discussed this during pt's office visit.

## 2012-11-01 NOTE — Progress Notes (Signed)
Put husband's fmla form on nurse's desk. °

## 2012-11-02 ENCOUNTER — Telehealth: Payer: Self-pay | Admitting: *Deleted

## 2012-11-02 NOTE — Telephone Encounter (Signed)
Husband called to request anti depressant or anti anxiety medication for pt..  States she is "falling apart" since her new cancer diagnosis.  Informed him of Lexapro ordered by Dr. Gaylyn Rong and sent to [facility] yesterday.  Informed Dr. Gaylyn Rong recommends Psychiatric referral to assist pt w/ anxiety and depression.  We can make referral if needed.  Husband says that Kathrin Penner gave him name of a psychiatrist to call for appt.Marland Kitchen  He is going to call his company [employer] today to get pt urgent appt.   Instructed to call back if unable to get pt appt.  He verbalized understanding.

## 2012-11-06 ENCOUNTER — Ambulatory Visit (HOSPITAL_COMMUNITY)
Admission: RE | Admit: 2012-11-06 | Discharge: 2012-11-06 | Disposition: A | Discharge status: Home / Self Care | Payer: 59 | Source: Ambulatory Visit | Attending: Oncology | Admitting: Oncology

## 2012-11-06 DIAGNOSIS — R918 Other nonspecific abnormal finding of lung field: Secondary | ICD-10-CM

## 2012-11-06 DIAGNOSIS — C801 Malignant (primary) neoplasm, unspecified: Secondary | ICD-10-CM | POA: Insufficient documentation

## 2012-11-06 DIAGNOSIS — C649 Malignant neoplasm of unspecified kidney, except renal pelvis: Secondary | ICD-10-CM

## 2012-11-06 MED ORDER — GADOBENATE DIMEGLUMINE 529 MG/ML IV SOLN
15.0000 mL | Freq: Once | INTRAVENOUS | Status: AC | PRN
  Administered 2012-11-06: 14 mL via INTRAVENOUS

## 2012-11-06 NOTE — Progress Notes (Signed)
Received call from husband today @ 11:30am,  wanting to know why patient needed MRI scheduled for  today (11/06/12 @ 10:00am) when she had an MRI 06/30/12; wanting to speak to Dr. Gaylyn Rong and very upset; Dr. Gaylyn Rong out of office but consulted with Clenton Pare, NP, and explained that patient has a new diagnosis of renal cell and routine procedures, including MRI, was necessary for patient, as baseline treatment; husband upset and states that 'no one cares about the cost of these procedures'; apologized to husband several time and stated that it was up to the patient to have procedure today; husband still upset and hangs up.

## 2012-11-07 ENCOUNTER — Telehealth: Payer: Self-pay | Admitting: *Deleted

## 2012-11-07 ENCOUNTER — Encounter: Payer: Self-pay | Admitting: *Deleted

## 2012-11-07 ENCOUNTER — Encounter: Payer: Self-pay | Admitting: Oncology

## 2012-11-07 NOTE — Progress Notes (Signed)
FMLA paperwork signed by Dr. Gaylyn Rong for pt's husband.  Forms returned to [facility] in [facility] Dept.

## 2012-11-07 NOTE — Telephone Encounter (Signed)
Informed husband of MRI brain did not show any cancer, no mets, per Dr. Gaylyn Rong.  Also informed of FMLA paperwork ready to pick up.  He requested it be faxed to him at work fax [phone].   Faxed forms to husband and left for pick up at Registration.  He verbalized understanding.

## 2012-11-07 NOTE — Telephone Encounter (Signed)
Please tell him that there was no brain met.  Thanks.

## 2012-11-07 NOTE — Progress Notes (Signed)
Put husband's fmla form in registration desk. °

## 2012-11-07 NOTE — Telephone Encounter (Signed)
Husband calling for results of pt's MRI done yesterday.

## 2012-11-12 ENCOUNTER — Other Ambulatory Visit: Payer: Self-pay | Admitting: Urology

## 2012-11-12 DIAGNOSIS — C649 Malignant neoplasm of unspecified kidney, except renal pelvis: Secondary | ICD-10-CM

## 2012-11-12 HISTORY — DX: Malignant neoplasm of unspecified kidney, except renal pelvis: C64.9

## 2012-11-13 DIAGNOSIS — R918 Other nonspecific abnormal finding of lung field: Secondary | ICD-10-CM | POA: Insufficient documentation

## 2012-11-14 ENCOUNTER — Encounter (HOSPITAL_COMMUNITY): Payer: Self-pay | Admitting: Pharmacy Technician

## 2012-11-14 ENCOUNTER — Encounter (HOSPITAL_COMMUNITY): Payer: Self-pay

## 2012-11-14 ENCOUNTER — Encounter (HOSPITAL_COMMUNITY)
Admission: RE | Admit: 2012-11-14 | Discharge: 2012-11-14 | Disposition: A | Discharge status: Home / Self Care | Payer: 59 | Source: Ambulatory Visit | Attending: Urology | Admitting: Urology

## 2012-11-14 HISTORY — DX: Other nonspecific abnormal finding of lung field: R91.8

## 2012-11-14 HISTORY — DX: Pain, unspecified: R52

## 2012-11-14 LAB — CBC
HCT: 37.3 % (ref 36.0–46.0)
MCV: 80 fL (ref 78.0–100.0)
RDW: 14.1 % (ref 11.5–15.5)
WBC: 7.9 10*3/uL (ref 4.0–10.5)

## 2012-11-14 LAB — BASIC METABOLIC PANEL
CO2: 28 mEq/L (ref 19–32)
Chloride: 99 mEq/L (ref 96–112)
Creatinine, Ser: 0.59 mg/dL (ref 0.50–1.10)

## 2012-11-14 NOTE — Patient Instructions (Signed)
YOUR SURGERY IS SCHEDULED AT [facility]  ON:  Thursday  3/20  REPORT TO [facility] AT:  5:15 AM      PHONE # FOR SHORT STAY IS [phone]   FOLLOW ANY BOWEL PREP INSTRUCTIONS FROM DR. BORDEN'S OFFICE- DAY BEFORE SURGERY.  DO NOT EAT OR DRINK ANYTHING AFTER MIDNIGHT THE NIGHT BEFORE YOUR SURGERY.  YOU MAY BRUSH YOUR TEETH, RINSE OUT YOUR MOUTH--BUT NO WATER, NO FOOD, NO CHEWING GUM, NO MINTS, NO CANDIES, NO CHEWING TOBACCO.  PLEASE TAKE THE FOLLOWING MEDICATIONS THE AM OF YOUR SURGERY WITH A FEW SIPS OF WATER:  NO MEDICATIONS TO TAKE AM OF YOUR SURGERY.   DO NOT BRING VALUABLES, MONEY, CREDIT CARDS.  DO NOT WEAR JEWELRY, MAKE-UP, NAIL POLISH AND NO METAL PINS OR CLIPS IN YOUR HAIR. CONTACT LENS, DENTURES / PARTIALS, GLASSES SHOULD NOT BE WORN TO SURGERY AND IN MOST CASES-HEARING AIDS WILL NEED TO BE REMOVED.  BRING YOUR GLASSES CASE, ANY EQUIPMENT NEEDED FOR YOUR CONTACT LENS. FOR PATIENTS ADMITTED TO THE HOSPITAL--CHECK OUT TIME THE DAY OF DISCHARGE IS 11:00 AM.  ALL INPATIENT ROOMS ARE PRIVATE - WITH BATHROOM, TELEPHONE, TELEVISION AND WIFI INTERNET.                               PLEASE READ OVER ANY  FACT SHEETS THAT YOU WERE GIVEN: MRSA INFORMATION, BLOOD TRANSFUSION INFORMATION, INCENTIVE SPIROMETER INFORMATION. FAILURE TO FOLLOW THESE INSTRUCTIONS MAY RESULT IN THE CANCELLATION OF YOUR SURGERY.   PATIENT SIGNATURE_________________________________

## 2012-11-14 NOTE — Pre-Procedure Instructions (Signed)
CXR REPORT IN EPIC FROM 10/11/12. EKG WAS DONE TODAY - PREOP - AT [facility].

## 2012-11-15 NOTE — Progress Notes (Signed)
Patient undecided on when to have CT lung and states that she will call office after surgery on 11/22/12 to schedule.

## 2012-11-21 NOTE — Anesthesia Preprocedure Evaluation (Addendum)
Anesthesia Evaluation  Patient identified by MRN, date of birth, ID band Patient awake    Reviewed: Allergy & Precautions, H&P , NPO status , Patient's Chart, lab work & pertinent test results  History of Anesthesia Complications (+) PROLONGED EMERGENCE  Airway Mallampati: II  Neck ROM: full    Dental  (+) Dental Advisory Given and Teeth Intact   Pulmonary shortness of breath and with exertion,  Sarcoidosis; note multiple lung lesions per chest CT report         Cardiovascular hypertension, Pt. on medications negative cardio ROS      Neuro/Psych Anxiety negative neurological ROS     GI/Hepatic negative GI ROS, Neg liver ROS,   Endo/Other  negative endocrine ROS  Renal/GU negative Renal ROSRenal cell carcinoma  negative genitourinary   Musculoskeletal negative musculoskeletal ROS (+)   Abdominal   Peds  Hematology negative hematology ROS (+)   Anesthesia Other Findings   Reproductive/Obstetrics negative OB ROS                          Anesthesia Physical Anesthesia Plan  ASA: III  Anesthesia Plan: General   Post-op Pain Management:    Induction: Intravenous  Airway Management Planned: Oral ETT  Additional Equipment:   Intra-op Plan:   Post-operative Plan: Extubation in OR  Informed Consent: I have reviewed the patients History and Physical, chart, labs and discussed the procedure including the risks, benefits and alternatives for the proposed anesthesia with the patient or authorized representative who has indicated his/her understanding and acceptance.   Dental advisory given  Plan Discussed with: CRNA  Anesthesia Plan Comments:         Anesthesia Quick Evaluation

## 2012-11-21 NOTE — H&P (Signed)
Chief Complaint  Metastatic renal cell carcinoma   Reason For Visit  Reason for consult: To discuss a cytoreductive nephrectomy for treatment of her metastatic renal cell carcinoma Physician requesting consult: Dr. Jethro Bolus Pulmonologist: Dr. Marcelyn Bruins   History of Present Illness  Ms. Leyland is a 57 year old who developed a progressive cough in January 2014.  She eventually underwent a chest x-ray on 09/25/12 which revealed bilateral pulmonary masses and a large right hilar mass.  This prompted a CT scan of the chest, abdomen, and pelvis on 09/28/12 which confirmed a 5 x 4.6 cm right hilar mass along with multiple bilateral pulmonary nodules measuring between 2 and 4 cm.  She was also noted to have a large partially solid/partially cystic enhancing left renal mass measuring 15 x 12 cm raising concern for renal cell carcinoma with metastatic disease to the chest. PET imaging demonstrated hypermetabolic activity within the lung masses and the renal mass consistent with malignancy.  She was hesitant to have a biopsy of her pulmonary lesions and underwent a percutaneous biopsy of her left renal mass on 10/23/12 which confirmed Fuhrman grade III clear cell renal cell carcinoma. She has been counseled thoroughly by Dr. Gaylyn Rong about her treatment options for systemic therapy which include high dose IL-2 or tyrosine kinase inhibitor therapy.  He has also discussed the role of cytoreductive nephrectomy and she is here today to discuss that further. She has an ECOG performance score of 0 and is otherwise healthy with her only comorbidity being hypertension.   I reviewed her CT scan independently.  She has a very large cystic and solid enhancing left renal mass measuring 15 cm in largest diameter.  There appears to be a single left renal artery and renal vein and the renal vein is well visualized with no evidence to suggest vein thrombus. There are no contralateral renal masses, no regional lymphadenopathy, no adrenal  lesions, and no other visceral metastatic disease apparent within the abdomen. Her chest scan does demonstrate multiple large bilateral pulmonary masses including a right hilar mass measuring about 5 cm.  Baseline renal function: Cr 0.8, eGFR > 60 ml/min   Past Medical History Problems  1. History of  Anxiety (Symptom) 300.00 2. History of  Hypertension 401.9  Surgical History Problems  1. History of  Biopsy Breast Open 2. History of  Breast Surgery Lumpectomy Left 3. History of  Brushing During Flexible Bronchoscopy 4. History of  Excision Of Lesion Neck 5. History of  Oral Surgery Tooth Extraction 6. History of  Tonsillectomy  Current Meds 1. Aspir-81 81 MG Oral Tablet Delayed Release; Therapy: (Recorded:04Mar2014) to 2. Hycodan 5-1.5 MG TABS; Therapy: (Recorded:04Mar2014) to 3. Hydrochlorothiazide 12.5 MG Oral Capsule; Therapy: (Recorded:04Mar2014) to  Allergies Medication  1. No Known Drug Allergies  Family History Problems  1. Family history of  Breast Cancer V16.3 2. Family history of  Colon Cancer V16.0 3. Family history of  Heart Disease V17.49 4. Family history of  Malignant Lymphoma Of The Stomach 5. Family history of  Skin Cancer V16.8 6. Family history of  Stroke Syndrome V17.1 Denied  7. Family history of  Kidney Cancer  Social History Problems    Former Smoker V15.82 smoked 1.5 packs per day x 40 years - quit smoking 09/18/2012   Marital History - Currently Married  Review of Systems Constitutional, skin, eye, otolaryngeal, hematologic/lymphatic, cardiovascular, pulmonary, endocrine, musculoskeletal, gastrointestinal, neurological and psychiatric system(s) were reviewed and pertinent findings if present are noted.  Genitourinary: no  hematuria.  Gastrointestinal: no nausea and no vomiting.  Constitutional: no recent weight loss.  Cardiovascular: no leg swelling.    Vitals Vital Signs [Data Includes: Last 1 Day]  04Mar2014 10:41AM  BMI Calculated:  24.48 BSA Calculated: 1.82 Height: 5 ft 7 in Weight: 156 lb  Blood Pressure: 115 / 75 Heart Rate: 134  Physical Exam Constitutional: Well nourished and well developed . No acute distress.  ENT:. The ears and nose are normal in appearance.  Neck: The appearance of the neck is normal and no neck mass is present.  Pulmonary: No respiratory distress, normal respiratory rhythm and effort and clear bilateral breath sounds.  Cardiovascular: Heart rate and rhythm are normal . No peripheral edema.  Abdomen: The abdomen is soft and nontender. No masses are palpated. No CVA tenderness. No hernias are palpable. No hepatosplenomegaly noted.  Lymphatics: The supraclavicular, femoral and inguinal nodes are not enlarged or tender.  Skin: Normal skin turgor, no visible rash and no visible skin lesions.  Neuro/Psych:. Mood and affect are appropriate.    Results/Data Urine [Data Includes: Last 1 Day]   04Mar2014  COLOR ORANGE   APPEARANCE CLEAR   SPECIFIC GRAVITY >1.030   pH 6.0   GLUCOSE NEG mg/dL  BILIRUBIN MOD   KETONE 40 mg/dL  BLOOD TRACE   PROTEIN 30 mg/dL  UROBILINOGEN 0.2 mg/dL  NITRITE NEG   LEUKOCYTE ESTERASE NEG   SQUAMOUS EPITHELIAL/HPF FEW   WBC 0-2 WBC/hpf  RBC 0-2 RBC/hpf  BACTERIA RARE   CRYSTALS NONE SEEN   CASTS NONE SEEN   Other MUCUS     I have independently reviewed her CT scan from 09/28/12, her PET scan, and her biopsy results from 10/23/12. I reviewed Dr. Lodema Pilot note in detail with findings as described above.   Plan Health Maintenance (V70.0)  1. UA With REFLEX  Done: 04Mar2014 10:33AM Renal Cell Carcinoma (189.0)  2. Follow-up Office  Follow-up  Requested for: 04Mar2014  Discussion/Summary  1. Renal cell carcinoma metastatic to the lung: I had a long detailed discussion with Ms. Calo and her husband today. I did review the overall prognosis related to metastatic renal cell carcinoma and the incurable nature of this disease. However, as she has previously been  counseled, she does have multiple treatment options for systemic therapy that made delayed progression and improve survival. We discussed the role of cytoreductive nephrectomy in the context of systemic therapy. She understands that this would not be a curative surgery but rather a surgery that may improve her response to systemic therapies.  Fortunately, she does have an excellent performance status, has no lymphadenopathy on imaging, and has pulmonary only metastatic disease. For these reasons, I do think she is a very acceptable candidate for cytoreductive nephrectomy and she understands and she does want to proceed with aggressive therapy that is cytoreductive nephrectomy would be part of this therapy.   We discussed surgical approaches including laparoscopic and open surgery. I do think that it would be reasonable to attempt a laparoscopic nephrectomy although she understands due to the large size of this mass that she would be at an increased risk for open surgical conversion. I estimated that she would have a 50% chance of requiring an open nephrectomy. We discussed the expected recovery process from each of these procedures. We also discussed the timing of surgery which I recommended she consider within the next few weeks so that she can recover and proceed with systemic therapy. In fact, did offer her a date on  the schedule next week.    We have discussed the risks of treatment in detail including but not limited to bleeding, infection, heart attack, stroke, death, venothromoboembolism, cancer recurrence, injury/damage to surrounding organs and structures, urine leak, the possibility of open surgical conversion for patients undergoing minimally invasive surgery, the risk of developing chronic kidney disease and its associated implications, and the potential risk of end stage renal disease possibly necessitating dialysis.   She and her husband are going to carefully consider her options and will  then get back with me. I do have some concerns that Ms. Schachter has not fully accepted her diagnosis and her prognosis.   Cc: Dr. Jethro Bolus Dr. Leodis Sias Dr. Gilmore Laroche    SignaturesElectronically signed by : Heloise Purpura, M.D.; Nov 06 2012 11:41AM

## 2012-11-22 ENCOUNTER — Encounter (HOSPITAL_COMMUNITY): Payer: Self-pay | Admitting: Anesthesiology

## 2012-11-22 ENCOUNTER — Ambulatory Visit (HOSPITAL_COMMUNITY): Payer: 59 | Admitting: Anesthesiology

## 2012-11-22 ENCOUNTER — Encounter (HOSPITAL_COMMUNITY): Payer: Self-pay | Admitting: *Deleted

## 2012-11-22 ENCOUNTER — Inpatient Hospital Stay (HOSPITAL_COMMUNITY)
Admission: RE | Admit: 2012-11-22 | Discharge: 2012-11-25 | DRG: 657 | Disposition: A | Discharge status: Home / Self Care | Payer: 59 | Source: Ambulatory Visit | Attending: Urology | Admitting: Urology

## 2012-11-22 ENCOUNTER — Ambulatory Visit: Payer: 59 | Admitting: Oncology

## 2012-11-22 ENCOUNTER — Encounter (HOSPITAL_COMMUNITY)
Admission: RE | Disposition: A | Discharge status: Home / Self Care | Payer: Self-pay | Source: Ambulatory Visit | Attending: Urology

## 2012-11-22 DIAGNOSIS — C7800 Secondary malignant neoplasm of unspecified lung: Secondary | ICD-10-CM | POA: Diagnosis present

## 2012-11-22 DIAGNOSIS — I10 Essential (primary) hypertension: Secondary | ICD-10-CM | POA: Diagnosis present

## 2012-11-22 DIAGNOSIS — Z01812 Encounter for preprocedural laboratory examination: Secondary | ICD-10-CM

## 2012-11-22 DIAGNOSIS — Z87891 Personal history of nicotine dependence: Secondary | ICD-10-CM

## 2012-11-22 DIAGNOSIS — Z7982 Long term (current) use of aspirin: Secondary | ICD-10-CM

## 2012-11-22 DIAGNOSIS — Z01810 Encounter for preprocedural cardiovascular examination: Secondary | ICD-10-CM

## 2012-11-22 DIAGNOSIS — Z79899 Other long term (current) drug therapy: Secondary | ICD-10-CM

## 2012-11-22 DIAGNOSIS — C649 Malignant neoplasm of unspecified kidney, except renal pelvis: Principal | ICD-10-CM | POA: Diagnosis present

## 2012-11-22 DIAGNOSIS — F411 Generalized anxiety disorder: Secondary | ICD-10-CM | POA: Diagnosis present

## 2012-11-22 HISTORY — PX: LAPAROSCOPIC NEPHRECTOMY: SHX1930

## 2012-11-22 LAB — BASIC METABOLIC PANEL
BUN: 11 mg/dL (ref 6–23)
CO2: 27 mEq/L (ref 19–32)
Calcium: 9.2 mg/dL (ref 8.4–10.5)
Chloride: 100 mEq/L (ref 96–112)
Creatinine, Ser: 0.65 mg/dL (ref 0.50–1.10)
Glucose, Bld: 159 mg/dL — ABNORMAL HIGH (ref 70–99)

## 2012-11-22 LAB — ABO/RH: ABO/RH(D): O POS

## 2012-11-22 LAB — TYPE AND SCREEN

## 2012-11-22 SURGERY — NEPHRECTOMY, RADICAL, LAPAROSCOPIC, ADULT
Anesthesia: General | Site: Abdomen | Laterality: Left | Wound class: Clean

## 2012-11-22 MED ORDER — ACETAMINOPHEN 10 MG/ML IV SOLN
1000.0000 mg | Freq: Four times a day (QID) | INTRAVENOUS | Status: DC
  Administered 2012-11-22 – 2012-11-23 (×4): 1000 mg via INTRAVENOUS
  Filled 2012-11-22 (×4): qty 100

## 2012-11-22 MED ORDER — HYDROMORPHONE HCL PF 1 MG/ML IJ SOLN
0.2500 mg | INTRAMUSCULAR | Status: DC | PRN
  Administered 2012-11-22 (×4): 0.5 mg via INTRAVENOUS

## 2012-11-22 MED ORDER — PROMETHAZINE HCL 25 MG/ML IJ SOLN: 6.2500 mg | INTRAMUSCULAR | Status: DC | PRN

## 2012-11-22 MED ORDER — ZOLPIDEM TARTRATE 5 MG PO TABS: 5.0000 mg | ORAL_TABLET | Freq: Every evening | ORAL | Status: DC | PRN

## 2012-11-22 MED ORDER — CEFAZOLIN SODIUM 1-5 GM-% IV SOLN
1.0000 g | Freq: Three times a day (TID) | INTRAVENOUS | Status: AC
  Administered 2012-11-22 (×2): 1 g via INTRAVENOUS
  Filled 2012-11-22 (×2): qty 50

## 2012-11-22 MED ORDER — LACTATED RINGERS IV SOLN: INTRAVENOUS | Status: DC

## 2012-11-22 MED ORDER — LIDOCAINE HCL (CARDIAC) 20 MG/ML IV SOLN
INTRAVENOUS | Status: DC | PRN
  Administered 2012-11-22: 60 mg via INTRAVENOUS

## 2012-11-22 MED ORDER — DOCUSATE SODIUM 100 MG PO CAPS
100.0000 mg | ORAL_CAPSULE | Freq: Two times a day (BID) | ORAL | Status: DC
  Administered 2012-11-23 – 2012-11-25 (×5): 100 mg via ORAL
  Filled 2012-11-22 (×7): qty 1

## 2012-11-22 MED ORDER — HYDROMORPHONE HCL PF 1 MG/ML IJ SOLN
INTRAMUSCULAR | Status: AC
  Filled 2012-11-22: qty 1

## 2012-11-22 MED ORDER — DIPHENHYDRAMINE HCL 12.5 MG/5ML PO ELIX
12.5000 mg | ORAL_SOLUTION | Freq: Four times a day (QID) | ORAL | Status: DC | PRN
  Filled 2012-11-22: qty 5

## 2012-11-22 MED ORDER — ACETAMINOPHEN 10 MG/ML IV SOLN
INTRAVENOUS | Status: AC
  Filled 2012-11-22: qty 100

## 2012-11-22 MED ORDER — LACTATED RINGERS IV SOLN
INTRAVENOUS | Status: DC | PRN
  Administered 2012-11-22 (×3): via INTRAVENOUS

## 2012-11-22 MED ORDER — ACETAMINOPHEN 10 MG/ML IV SOLN
INTRAVENOUS | Status: DC | PRN
  Administered 2012-11-22: 1000 mg via INTRAVENOUS

## 2012-11-22 MED ORDER — BUPIVACAINE LIPOSOME 1.3 % IJ SUSP
INTRAMUSCULAR | Status: DC | PRN
  Administered 2012-11-22 (×2)

## 2012-11-22 MED ORDER — NEOSTIGMINE METHYLSULFATE 1 MG/ML IJ SOLN
INTRAMUSCULAR | Status: DC | PRN
  Administered 2012-11-22: 4 mg via INTRAVENOUS

## 2012-11-22 MED ORDER — BUPIVACAINE LIPOSOME 1.3 % IJ SUSP
20.0000 mL | Freq: Once | INTRAMUSCULAR | Status: DC
  Filled 2012-11-22: qty 20

## 2012-11-22 MED ORDER — HEPARIN SODIUM (PORCINE) 5000 UNIT/ML IJ SOLN
5000.0000 [IU] | Freq: Three times a day (TID) | INTRAMUSCULAR | Status: DC
  Administered 2012-11-22 – 2012-11-25 (×8): 5000 [IU] via SUBCUTANEOUS
  Filled 2012-11-22 (×11): qty 1

## 2012-11-22 MED ORDER — PROPOFOL 10 MG/ML IV BOLUS
INTRAVENOUS | Status: DC | PRN
  Administered 2012-11-22: 140 mg via INTRAVENOUS

## 2012-11-22 MED ORDER — HYDROMORPHONE HCL PF 1 MG/ML IJ SOLN
INTRAMUSCULAR | Status: DC | PRN
  Administered 2012-11-22 (×3): .5 mg via INTRAVENOUS

## 2012-11-22 MED ORDER — CEFAZOLIN SODIUM-DEXTROSE 2-3 GM-% IV SOLR
2.0000 g | INTRAVENOUS | Status: AC
  Administered 2012-11-22: 2 g via INTRAVENOUS

## 2012-11-22 MED ORDER — ONDANSETRON HCL 4 MG/2ML IJ SOLN: 4.0000 mg | INTRAMUSCULAR | Status: DC | PRN

## 2012-11-22 MED ORDER — PHENYLEPHRINE HCL 10 MG/ML IJ SOLN
INTRAMUSCULAR | Status: DC | PRN
  Administered 2012-11-22: 80 ug via INTRAVENOUS
  Administered 2012-11-22 (×2): 40 ug via INTRAVENOUS

## 2012-11-22 MED ORDER — BELLADONNA ALKALOIDS-OPIUM 16.2-60 MG RE SUPP
1.0000 | Freq: Four times a day (QID) | RECTAL | Status: DC | PRN
  Administered 2012-11-22: 1 via RECTAL
  Filled 2012-11-22: qty 1

## 2012-11-22 MED ORDER — PHENOL 1.4 % MT LIQD: 1.0000 | OROMUCOSAL | Status: DC | PRN

## 2012-11-22 MED ORDER — SUCCINYLCHOLINE CHLORIDE 20 MG/ML IJ SOLN
INTRAMUSCULAR | Status: DC | PRN
  Administered 2012-11-22: 80 mg via INTRAVENOUS

## 2012-11-22 MED ORDER — ROCURONIUM BROMIDE 100 MG/10ML IV SOLN
INTRAVENOUS | Status: DC | PRN
  Administered 2012-11-22 (×3): 10 mg via INTRAVENOUS
  Administered 2012-11-22: 30 mg via INTRAVENOUS

## 2012-11-22 MED ORDER — ACETAMINOPHEN 325 MG PO TABS
650.0000 mg | ORAL_TABLET | ORAL | Status: DC | PRN
  Filled 2012-11-22: qty 2

## 2012-11-22 MED ORDER — FENTANYL CITRATE 0.05 MG/ML IJ SOLN
INTRAMUSCULAR | Status: DC | PRN
  Administered 2012-11-22 (×10): 50 ug via INTRAVENOUS

## 2012-11-22 MED ORDER — CEFAZOLIN SODIUM-DEXTROSE 2-3 GM-% IV SOLR
INTRAVENOUS | Status: AC
  Filled 2012-11-22: qty 50

## 2012-11-22 MED ORDER — DIPHENHYDRAMINE HCL 50 MG/ML IJ SOLN
12.5000 mg | Freq: Four times a day (QID) | INTRAMUSCULAR | Status: DC | PRN
  Administered 2012-11-22 – 2012-11-23 (×2): 12.5 mg via INTRAVENOUS
  Filled 2012-11-22 (×2): qty 1

## 2012-11-22 MED ORDER — GLYCOPYRROLATE 0.2 MG/ML IJ SOLN
INTRAMUSCULAR | Status: DC | PRN
  Administered 2012-11-22: .6 mg via INTRAVENOUS

## 2012-11-22 MED ORDER — HYDROCHLOROTHIAZIDE 12.5 MG PO CAPS
12.5000 mg | ORAL_CAPSULE | Freq: Every day | ORAL | Status: DC
  Administered 2012-11-23 – 2012-11-25 (×3): 12.5 mg via ORAL
  Filled 2012-11-22 (×3): qty 1

## 2012-11-22 MED ORDER — DEXTROSE-NACL 5-0.45 % IV SOLN
INTRAVENOUS | Status: DC
  Administered 2012-11-22 – 2012-11-23 (×3): via INTRAVENOUS

## 2012-11-22 MED ORDER — MIDAZOLAM HCL 5 MG/5ML IJ SOLN
INTRAMUSCULAR | Status: DC | PRN
  Administered 2012-11-22: 2 mg via INTRAVENOUS

## 2012-11-22 MED ORDER — ONDANSETRON HCL 4 MG/2ML IJ SOLN
INTRAMUSCULAR | Status: DC | PRN
  Administered 2012-11-22: 4 mg via INTRAVENOUS

## 2012-11-22 MED ORDER — HYDROMORPHONE HCL PF 1 MG/ML IJ SOLN
0.5000 mg | INTRAMUSCULAR | Status: DC | PRN
  Administered 2012-11-22 – 2012-11-23 (×8): 1 mg via INTRAVENOUS
  Filled 2012-11-22 (×8): qty 1

## 2012-11-22 MED ORDER — MENTHOL 3 MG MT LOZG
1.0000 | LOZENGE | OROMUCOSAL | Status: DC | PRN
  Filled 2012-11-22: qty 9

## 2012-11-22 SURGICAL SUPPLY — 62 items
BAG ZIPLOCK 12X15 (MISCELLANEOUS) ×2 IMPLANT
BENZOIN TINCTURE PRP APPL 2/3 (GAUZE/BANDAGES/DRESSINGS) IMPLANT
BLADE EXTENDED COATED 6.5IN (ELECTRODE) IMPLANT
BLADE SURG SZ10 CARB STEEL (BLADE) IMPLANT
CANISTER SUCTION 2500CC (MISCELLANEOUS) ×2 IMPLANT
CHLORAPREP W/TINT 26ML (MISCELLANEOUS) ×2 IMPLANT
CLIP LIGATING HEM O LOK PURPLE (MISCELLANEOUS) ×2 IMPLANT
CLIP LIGATING HEMO LOK XL GOLD (MISCELLANEOUS) IMPLANT
CLIP LIGATING HEMO O LOK GREEN (MISCELLANEOUS) ×2 IMPLANT
CLOTH BEACON ORANGE TIMEOUT ST (SAFETY) ×2 IMPLANT
COVER SURGICAL LIGHT HANDLE (MISCELLANEOUS) ×2 IMPLANT
CUTTER FLEX LINEAR 45M (STAPLE) ×2 IMPLANT
DERMABOND ADVANCED (GAUZE/BANDAGES/DRESSINGS) ×1
DERMABOND ADVANCED .7 DNX12 (GAUZE/BANDAGES/DRESSINGS) ×1 IMPLANT
DRAIN CHANNEL 10F 3/8 F FF (DRAIN) IMPLANT
DRAPE CAMERA CLOSED 9X96 (DRAPES) ×2 IMPLANT
DRAPE INCISE IOBAN 66X45 STRL (DRAPES) ×2 IMPLANT
DRAPE LAPAROSCOPIC ABDOMINAL (DRAPES) ×2 IMPLANT
DRAPE WARM FLUID 44X44 (DRAPE) IMPLANT
DRSG TEGADERM 4X4.75 (GAUZE/BANDAGES/DRESSINGS) ×2 IMPLANT
ELECT REM PT RETURN 9FT ADLT (ELECTROSURGICAL) ×2
ELECTRODE REM PT RTRN 9FT ADLT (ELECTROSURGICAL) ×1 IMPLANT
EVACUATOR SILICONE 100CC (DRAIN) IMPLANT
FLOSEAL 10ML (HEMOSTASIS) ×2 IMPLANT
GLOVE BIOGEL M STRL SZ7.5 (GLOVE) ×2 IMPLANT
GOWN STRL NON-REIN LRG LVL3 (GOWN DISPOSABLE) ×4 IMPLANT
HEMOSTAT SURGICEL 4X8 (HEMOSTASIS) IMPLANT
KIT BASIN OR (CUSTOM PROCEDURE TRAY) ×2 IMPLANT
PENCIL BUTTON HOLSTER BLD 10FT (ELECTRODE) ×2 IMPLANT
POSITIONER SURGICAL ARM (MISCELLANEOUS) ×4 IMPLANT
POUCH ENDO CATCH II 15MM (MISCELLANEOUS) ×2 IMPLANT
POUCH SPECIMEN RETRIEVAL 10MM (ENDOMECHANICALS) ×2 IMPLANT
RELOAD 45 VASCULAR/THIN (ENDOMECHANICALS) ×2 IMPLANT
RETRACTOR LAPSCP 12X46 CVD (ENDOMECHANICALS) IMPLANT
RTRCTR LAPSCP 12X46 CVD (ENDOMECHANICALS)
SCALPEL HARMONIC ACE (MISCELLANEOUS) ×2 IMPLANT
SCISSORS LAP 5X35 DISP (ENDOMECHANICALS) IMPLANT
SET IRRIG TUBING LAPAROSCOPIC (IRRIGATION / IRRIGATOR) ×2 IMPLANT
SOLUTION ANTI FOG 6CC (MISCELLANEOUS) ×2 IMPLANT
SPONGE GAUZE 4X4 12PLY (GAUZE/BANDAGES/DRESSINGS) ×2 IMPLANT
SPONGE LAP 18X18 X RAY DECT (DISPOSABLE) ×2 IMPLANT
SPONGE LAP 4X18 X RAY DECT (DISPOSABLE) IMPLANT
SPONGE SURGIFOAM ABS GEL 100 (HEMOSTASIS) IMPLANT
STRIP CLOSURE SKIN 1/4X4 (GAUZE/BANDAGES/DRESSINGS) IMPLANT
SURGIFLO W/THROMBIN 8M KIT (HEMOSTASIS) IMPLANT
SUT ETHILON 3 0 PS 1 (SUTURE) IMPLANT
SUT MNCRL AB 4-0 PS2 18 (SUTURE) ×4 IMPLANT
SUT PDS AB 1 CTX 36 (SUTURE) ×2 IMPLANT
SUT VIC AB 2-0 SH 27 (SUTURE) ×1
SUT VIC AB 2-0 SH 27X BRD (SUTURE) ×1 IMPLANT
SUT VICRYL 0 UR6 27IN ABS (SUTURE) ×2 IMPLANT
TAPE STRIPS DRAPE STRL (GAUZE/BANDAGES/DRESSINGS) IMPLANT
TOWEL OR 17X26 10 PK STRL BLUE (TOWEL DISPOSABLE) ×2 IMPLANT
TOWEL OR NON WOVEN STRL DISP B (DISPOSABLE) ×4 IMPLANT
TRAY FOLEY CATH 14FRSI W/METER (CATHETERS) ×2 IMPLANT
TRAY LAP CHOLE (CUSTOM PROCEDURE TRAY) ×2 IMPLANT
TROCAR BLADELESS OPT 5 100 (ENDOMECHANICALS) ×2 IMPLANT
TROCAR BLADELESS OPT 5 75 (ENDOMECHANICALS) ×2 IMPLANT
TROCAR XCEL 12X100 BLDLESS (ENDOMECHANICALS) ×2 IMPLANT
TROCAR XCEL BLUNT TIP 100MML (ENDOMECHANICALS) ×2 IMPLANT
TUBING INSUFFLATION 10FT LAP (TUBING) ×2 IMPLANT
YANKAUER SUCT BULB TIP 10FT TU (MISCELLANEOUS) ×2 IMPLANT

## 2012-11-22 NOTE — Interval H&P Note (Signed)
History and Physical Interval Note:  11/22/2012 7:12 AM  Sheri Hernandez  has presented today for surgery, with the diagnosis of RENAL CELL CARCINOMA  The various methods of treatment have been discussed with the patient and family. After consideration of risks, benefits and other options for treatment, the patient has consented to  Procedure(s) with comments: LAPAROSCOPIC NEPHRECTOMY (Left) - 3.5  Left Laparoscopic versus Open Radical Nephrectomy  as a surgical intervention .  The patient's history has been reviewed, patient examined, no change in status, stable for surgery.  I have reviewed the patient's chart and labs.  Questions were answered to the patient's satisfaction.     Aeric Burnham,LES

## 2012-11-22 NOTE — Anesthesia Postprocedure Evaluation (Signed)
Anesthesia Post Note  Patient: Sheri Hernandez  Procedure(s) Performed: Procedure(s) (LRB): LAPAROSCOPIC NEPHRECTOMY (Left)  Anesthesia type: General  Patient location: PACU  Post pain: Pain level controlled  Post assessment: Post-op Vital signs reviewed  Last Vitals:  Filed Vitals:   11/22/12 1030  BP: 116/92  Pulse: 85  Temp: 36.5 C  Resp: 12    Post vital signs: Reviewed  Level of consciousness: sedated  Complications: No apparent anesthesia complications

## 2012-11-22 NOTE — Transfer of Care (Signed)
Immediate Anesthesia Transfer of Care Note  Patient: Sheri Hernandez  Procedure(s) Performed: Procedure(s): LAPAROSCOPIC NEPHRECTOMY (Left)  Patient Location: PACU  Anesthesia Type:General  Level of Consciousness: awake and alert   Airway & Oxygen Therapy: Patient Spontanous Breathing and Patient connected to face mask oxygen  Post-op Assessment: Report given to PACU RN, Post -op Vital signs reviewed and stable and Patient moving all extremities  Post vital signs: Reviewed and stable  Complications: No apparent anesthesia complications

## 2012-11-22 NOTE — Care Management Note (Signed)
    Page 1 of 1   11/22/2012     1:31:30 PM   CARE MANAGEMENT NOTE 11/22/2012  Patient:  Sheri Hernandez, Sheri Hernandez   Account Number:  [id number]  Date Initiated:  11/22/2012  Documentation initiated by:  Lanier Clam  Subjective/Objective Assessment:   ADMITTED W/MET RENAL CELL CA.     Action/Plan:   FROM HOME W/SPOUSE.   Anticipated DC Date:  11/24/2012   Anticipated DC Plan:  HOME/SELF CARE      DC Planning Services  CM consult      Choice offered to / List presented to:             Status of service:  In process, will continue to follow Medicare Important Message given?   (If response is "NO", the following Medicare IM given date fields will be blank) Date Medicare IM given:   Date Additional Medicare IM given:    Discharge Disposition:    Per UR Regulation:  Reviewed for med. necessity/level of care/duration of stay  If discussed at Long Length of Stay Meetings, dates discussed:    Comments:  11/22/12 Casandra Dallaire RN,BSN NCM [phone] S/P LAP NEPHRECTOMY.

## 2012-11-22 NOTE — Progress Notes (Signed)
Chaplain Note: Spoke to patient by phone as the trip to the hospital for the on call Chaplain would have not been accomplished before her surgery.  Page came at 6:05am.  Chaplain spent about 25 minutes on the phone with the patient and her husband (mostly patient).  She said she was anxious and scared.  Chaplain affirmed her feelings and prayed with the patient.  Patient acknowledged feeling better and mnore relaxed after the prayer, but her anxiety was still perceivable.  Husband had open heart surgery a short while back, and he was more "pragmatic" (his word) about the situation.  He said the patient will be at [facility] after the surgery (how long he did not indicate).  Rema Jasmine, Chaplain Pager: [phone]

## 2012-11-22 NOTE — Op Note (Signed)
Preoperative diagnosis: Renal cell carcinoma  Postoperative diagnosis: Renal cell carcinoma  Procedure: 1. Cytoreductive left laparoscopic radical nephrectomy  Surgeon: Moody Bruins. M.D.  Assistant(s):  Pecola Leisure, PA-C  Anesthesia: General  Complications: None  EBL: 100 mL  IVF:  2000 mL crystalloid  Specimens: 1. Left kidney and renal mass 2. Adjacent tumor nodule  Disposition of specimens: Pathology  Indication: Sheri Hernandez is a 57 y.o. patient with a left renal tumor that has been biopsied and was found to be clear cell renal cell carcinoma.  She does have metastatic disease to the chest and after discussing the pros and cons of proceeding with a cytoreductive nephrectomy, she elected to proceed.  After a thorough review of the management options for their renal mass, she elected to proceed with surgical treatment and the above procedure.  We have discussed the potential benefits and risks of the procedure, side effects of the proposed treatment, the likelihood of the patient achieving the goals of the procedure, and any potential problems that might occur during the procedure or recuperation. Informed consent has been obtained.  Description of procedure:  The patient was taken to the operating room and a general anesthetic was administered. The patient was given preoperative antibiotics, placed in the left modified flank position, and prepped and draped in the usual sterile fashion. Next a preoperative timeout was performed.  A site was selected near the umbilicus for placement of the camera port. This was placed using a standard open Hassan technique which allowed entry into the peritoneal cavity under direct vision and without difficulty. A 12 mm Hassan cannula was placed and a pneumoperitoneum established. The camera was then used to inspect the abdomen and there was no evidence of any intra-abdominal injuries or other abnormalities. The remaining abdominal  ports were then placed. A 12 mm port was placed in the left lower quadrant and a 5 mm port was placed in the left upper quadrant.  All ports were placed under direct vision without difficulty.  Utilizing the harmonic scalpel, the omentum (which was stuck over the kidney and adherent to the abdominal wall) was taken down and mobilized medially off the kidney.  The white line of Toldt was incised allowing the colon to be reflected medially and the plane between the mesocolon and the anterior layer of Gerota's fascia to be developed and the kidney and renal mass exposed. The renal mass was large and multilobular.  The ureter and gonadal vein were identified inferiorly and the ureter was lifted anteriorly off the psoas muscle.  Dissection proceeded superiorly along the gonadal vein until the renal vein was identified.  The renal hilum was then carefully isolated with a combination of blunt and sharp dissectiong allowing the renal arterial and venous structures to be separated and isolated. There was noted to be a single renal artery and a single renal vein with a large lumbar vein extending over the renal artery.  The lumbar vein was isolated with right angle dissection and ligated with multiple Weck clips and divided. The renal artery was isolated and ligated with multiple Weck clips and subsequently divided.  The renal vein was then isolated and also ligated and divided with a 45 mm Flex ETS stapler.  Gerota's fascia was intentionally entered superiorly.  A portion of the adrenal gland was adherent to the mass and was taken with the mass with the harmonic scalpel and Weck clips placed on the adrenal tissue to allowing partial adrenal sparing.  The splenorenal  ligaments were divided with the harmonic scalpel. The colon mesentery was densely adherent to the upper pole of the kidney and a portion of the mesentery needed to be removed with the tumor. The mesenteric opening was then closed with Weck clips. The  lateral and posterior attachements to the kidney were then divided.  The ureter was ligated with Weck clips and divided allowing the specimen to be freed from all surrounding structures.  The kidney specimen was then placed into a 15 mm Endocatch II retrieval bag.  The renal hilum, liver, adrenal bed and gonadal vein areas were each inspected and hemostasis was ensured with the pneomperitoneal pressures lowered. There was an adjacent tumor nodule vs. lymph node which was removed via a separate endopouch retrieval bag. The 12 mm lower quadrant port was then closed with a 0-vicryl suture placed laparoscopically to close the fascia of this incision. All remaining ports were removed under direct vision.  The kidney specimen was removed intact within the retrieval bag via the camera port site after this incision was extended slightly. This fascial opening was then closed with two #1 PDS sutures.    All incisions were injected with local anesthetic and reapproximated at the skin with 4-0 monocryl sutures.  Dermabond was applied to the skin. The patient tolerated the procedure well and without complications and was transferred to the recovery unit in satisfactory condition.   Moody Bruins MD

## 2012-11-22 NOTE — Progress Notes (Signed)
Post-op note  Subjective: The patient is doing well.  Her only complaint is of soreness on her right side.  No N/V.  Objective: Vital signs in last 24 hours: Temp:  [97.5 F (36.4 C)-98.2 F (36.8 C)] 97.8 F (36.6 C) (03/20 1200) Pulse Rate:  [74-102] 86 (03/20 1215) Resp:  [11-18] 16 (03/20 1215) BP: (111-143)/(65-92) 111/78 mmHg (03/20 1215) SpO2:  [93 %-100 %] 98 % (03/20 1215) Weight:  [70.852 kg (156 lb 3.2 oz)] 70.852 kg (156 lb 3.2 oz) (03/20 1145)  Intake/Output from previous day:   Intake/Output this shift: Total I/O In: 2150 [I.V.:2150] Out: 260 [Urine:160; Blood:100]  Physical Exam:  General: Alert and oriented. Abdomen: Soft, Nondistended. Incisions: Clean and dry.  Lab Results:  Recent Labs  11/22/12 1046  HGB 10.5*  HCT 32.7*    Assessment/Plan: POD#0   1) Continue to monitor 2) Amb, IS, DVT prophy, pain control, clears   LOS: 0 days   Silas Flood. 11/22/2012, 2:42 PM

## 2012-11-23 LAB — BASIC METABOLIC PANEL
BUN: 7 mg/dL (ref 6–23)
Chloride: 101 mEq/L (ref 96–112)
Glucose, Bld: 120 mg/dL — ABNORMAL HIGH (ref 70–99)
Potassium: 3.9 mEq/L (ref 3.5–5.1)

## 2012-11-23 MED ORDER — SIMETHICONE 80 MG PO CHEW
80.0000 mg | CHEWABLE_TABLET | Freq: Four times a day (QID) | ORAL | Status: DC
  Administered 2012-11-23 – 2012-11-25 (×5): 80 mg via ORAL
  Filled 2012-11-23 (×10): qty 1

## 2012-11-23 MED ORDER — DOCUSATE SODIUM 100 MG PO CAPS: 100.0000 mg | ORAL_CAPSULE | Freq: Two times a day (BID) | ORAL | Status: DC

## 2012-11-23 MED ORDER — HYDROMORPHONE HCL PF 1 MG/ML IJ SOLN
1.0000 mg | Freq: Once | INTRAMUSCULAR | Status: AC
  Administered 2012-11-23: 1 mg via INTRAVENOUS
  Filled 2012-11-23: qty 1

## 2012-11-23 MED ORDER — OXYCODONE-ACETAMINOPHEN 5-325 MG PO TABS
1.0000 | ORAL_TABLET | Freq: Four times a day (QID) | ORAL | Status: DC | PRN
  Administered 2012-11-23: 2 via ORAL
  Filled 2012-11-23: qty 2

## 2012-11-23 MED ORDER — OXYCODONE-ACETAMINOPHEN 5-325 MG PO TABS: 1.0000 | ORAL_TABLET | ORAL | Status: DC | PRN

## 2012-11-23 MED ORDER — HYDROMORPHONE HCL PF 1 MG/ML IJ SOLN
0.5000 mg | INTRAMUSCULAR | Status: DC | PRN
  Administered 2012-11-23 – 2012-11-24 (×8): 1 mg via INTRAVENOUS
  Filled 2012-11-23 (×9): qty 1

## 2012-11-23 MED ORDER — BISACODYL 10 MG RE SUPP
10.0000 mg | Freq: Once | RECTAL | Status: AC
  Administered 2012-11-23: 10 mg via RECTAL
  Filled 2012-11-23: qty 1

## 2012-11-23 NOTE — Progress Notes (Signed)
Patient ID: Sheri Hernandez, female   DOB: 1956/08/02, 58 y.o.   MRN: [id number]  Pt has only ambulated once today.  She has complained of pain and was switched from oral Percocet back to IV Dilaudid.  Her pain has been across her lower abdomen and varies between her right and left sides.  She has not complained of incisional or flank pain. She has passed a small amount of flatus which made her feel better.  She has been tolerating her diet.  - She will continue IV Dilaudid prn overnight and then I think switching back to po Percocet tomorrow would be reasonable and she agrees.  She understands that I suspect her pain is related to bowel distention and the treatment for that would be to ambulate and help to begin passing flatus.  She understands that more narcotics will only likely increase her chances of developing an ileus and having more abdominal pain.  -  She likely will not be ready for D/C tomorrow although if feeling better, that would be appropriate.  Otherwise, would target Sunday if she makes appropriate progress.

## 2012-11-23 NOTE — Progress Notes (Signed)
Patient ID: Sheri Hernandez, female   DOB: September 15, 1955, 57 y.o.   MRN: [id number]  1 Day Post-Op Subjective: The patient complains of abdominal pain. She has been taking her IV pain medication every 2 hrs on the clock. No nausea or vomiting. She refused to ambulate last night.  Objective: Vital signs in last 24 hours: Temp:  [97.5 F (36.4 C)-98.5 F (36.9 C)] 98.5 F (36.9 C) (03/21 0421) Pulse Rate:  [74-93] 93 (03/21 0421) Resp:  [11-20] 20 (03/21 0421) BP: (111-134)/(65-92) 117/65 mmHg (03/21 0421) SpO2:  [90 %-100 %] 99 % (03/21 0421) Weight:  [70.852 kg (156 lb 3.2 oz)] 70.852 kg (156 lb 3.2 oz) (03/20 1145)  Intake/Output from previous day: 03/20 0701 - 03/21 0700 In: 4452.5 [P.O.:360; I.V.:3942.5; IV Piggyback:150] Out: 2560 [Urine:2460; Blood:100] Intake/Output this shift: Total I/O In: 100 [IV Piggyback:100] Out: -   Physical Exam:  General: Alert and oriented. CV: RRR Lungs: Clear bilaterally. GI: Soft, Nondistended. Incisions: Clean and dry. Urine: Clear Extremities: Nontender, no erythema, no edema.  Lab Results:  Recent Labs  11/22/12 1046 11/23/12 0420  HGB 10.5* 9.7*  HCT 32.7* 30.3*          Recent Labs  11/22/12 1046 11/23/12 0420  CREATININE 0.65 0.74           Results for orders placed during the hospital encounter of 11/22/12 (from the past 24 hour(s))  BASIC METABOLIC PANEL     Status: Abnormal   Collection Time    11/22/12 10:46 AM      Result Value Range   Sodium 136  135 - 145 mEq/L   Potassium 3.9  3.5 - 5.1 mEq/L   Chloride 100  96 - 112 mEq/L   CO2 27  19 - 32 mEq/L   Glucose, Bld 159 (*) 70 - 99 mg/dL   BUN 11  6 - 23 mg/dL   Creatinine, Ser 7.82  0.50 - 1.10 mg/dL   Calcium 9.2  8.4 - 95.6 mg/dL   GFR calc non Af Amer >90  >90 mL/min   GFR calc Af Amer >90  >90 mL/min  HEMOGLOBIN AND HEMATOCRIT, BLOOD     Status: Abnormal   Collection Time    11/22/12 10:46 AM      Result Value Range   Hemoglobin 10.5 (*) 12.0 - 15.0  g/dL   HCT 21.3 (*) 08.6 - 57.8 %  BASIC METABOLIC PANEL     Status: Abnormal   Collection Time    11/23/12  4:20 AM      Result Value Range   Sodium 137  135 - 145 mEq/L   Potassium 3.9  3.5 - 5.1 mEq/L   Chloride 101  96 - 112 mEq/L   CO2 29  19 - 32 mEq/L   Glucose, Bld 120 (*) 70 - 99 mg/dL   BUN 7  6 - 23 mg/dL   Creatinine, Ser 4.69  0.50 - 1.10 mg/dL   Calcium 9.1  8.4 - 62.9 mg/dL   GFR calc non Af Amer >90  >90 mL/min   GFR calc Af Amer >90  >90 mL/min  HEMOGLOBIN AND HEMATOCRIT, BLOOD     Status: Abnormal   Collection Time    11/23/12  4:20 AM      Result Value Range   Hemoglobin 9.7 (*) 12.0 - 15.0 g/dL   HCT 52.8 (*) 41.3 - 24.4 %    Assessment/Plan: POD# 1 s/p laparoscopic nephrectomy.  1) Ambulate, Incentive spirometry -  I strongly encouraged her to ambulate and expressed the concerns about DVT if she does not ambulate.  She is also on [place] heparin for prophylaxis. 2) Advance diet as tolerated 3) Transition to oral pain medication (it is very difficult to assess her pain considering her complaints even prior surgery of pain with minimal touch) 4) Dulcolax suppository 5) D/C urethral catheter   Moody Bruins. MD   LOS: 1 day   Almeter Westhoff,LES 11/23/2012, 7:17 AM

## 2012-11-24 LAB — BASIC METABOLIC PANEL
CO2: 24 mEq/L (ref 19–32)
Chloride: 97 mEq/L (ref 96–112)
Glucose, Bld: 126 mg/dL — ABNORMAL HIGH (ref 70–99)
Potassium: 3.7 mEq/L (ref 3.5–5.1)
Sodium: 135 mEq/L (ref 135–145)

## 2012-11-24 MED ORDER — ACETAMINOPHEN 325 MG PO TABS
650.0000 mg | ORAL_TABLET | ORAL | Status: DC | PRN
  Administered 2012-11-24: 650 mg via ORAL

## 2012-11-24 MED ORDER — OXYCODONE-ACETAMINOPHEN 5-325 MG PO TABS
1.0000 | ORAL_TABLET | ORAL | Status: DC | PRN
  Administered 2012-11-24 – 2012-11-25 (×6): 2 via ORAL
  Filled 2012-11-24 (×8): qty 2

## 2012-11-24 MED ORDER — ACETAMINOPHEN 325 MG PO TABS
ORAL_TABLET | ORAL | Status: AC
  Filled 2012-11-24: qty 2

## 2012-11-24 NOTE — Progress Notes (Signed)
Pt very anxious. Pt moaning in pain all night. Pt requested iv pain medication q 2hours. Pt stated that the pain is in her abdomen but radiates to her back on both sides. Encouraged patient to ambulate but patient has not as of yet. Pt. Has been up and down to the bathroom and bedside commode over a dozen times with no BM but reports gas.

## 2012-11-24 NOTE — Progress Notes (Signed)
Pt seen and examined. Only asks about her pain meds. Told she will get her meds on a schedule, and will walk today x 4 and d/c in AM.

## 2012-11-24 NOTE — Progress Notes (Signed)
2 Days Post-Op Subjective: Patient reports Pt is still up in bed watching TV and eating regular breakfast. When asked if she was excited about going home today she became very emotional and states she just can't go today. "I hurt too bad and I haven't even been walking further than the bathroom."   Objective: Vital signs in last 24 hours: Temp:  [97.8 F (36.6 C)-98.5 F (36.9 C)] 98.1 F (36.7 C) (03/22 0540) Pulse Rate:  [86-109] 96 (03/22 0540) Resp:  [16-18] 18 (03/22 0540) BP: (122-150)/(65-86) 150/86 mmHg (03/22 0540) SpO2:  [91 %-96 %] 96 % (03/22 0540)  Intake/Output from previous day: 03/21 0701 - 03/22 0700 In: 3043.8 [P.O.:960; I.V.:1983.8; IV Piggyback:100] Out: 1100 [Urine:1100] Intake/Output this shift:    Physical Exam:  General:alert, cooperative and no distress GI: soft, non tender, normal bowel sounds, no palpable masses, no organomegaly, no inguinal hernia Female genitalia: not done not indicated Resp: clear to auscultation bilaterally Cardio: regular rate and rhythm  Lab Results:  Recent Labs  11/22/12 1046 11/23/12 0420  HGB 10.5* 9.7*  HCT 32.7* 30.3*   BMET  Recent Labs  11/23/12 0420 11/24/12 0501  NA 137 135  K 3.9 3.7  CL 101 97  CO2 29 24  GLUCOSE 120* 126*  BUN 7 7  CREATININE 0.74 0.69  CALCIUM 9.1 9.5   No results found for this basename: LABPT, INR,  in the last 72 hours No results found for this basename: LABURIN,  in the last 72 hours Results for orders placed during the hospital encounter of 11/14/12  SURGICAL PCR SCREEN     Status: None   Collection Time    11/14/12 11:08 AM      Result Value Range Status   MRSA, PCR NEGATIVE  NEGATIVE Final   Staphylococcus aureus NEGATIVE  NEGATIVE Final   Comment:            The Xpert SA Assay (FDA     approved for NASAL specimens     in patients over 37 years of age),     is one component of     a comprehensive surveillance     program.  Test performance has     been  validated by [employer] for patients greater     than or equal to 85 year old.     It is not intended     to diagnose infection nor to     guide or monitor treatment.    Studies/Results: No results found.  Assessment/Plan: Status post left laparoscopic cytoreductive nephrectomy for metastatic RCC. Discussed that we will change IV to NSL. Will have pt begin ambulating in hallway TID/QID today to expect discharge in am.   LOS: 2 days   [facility] 11/24/2012, 9:07 AM

## 2012-11-25 MED ORDER — OXYCODONE-ACETAMINOPHEN 5-325 MG PO TABS: 1.0000 | ORAL_TABLET | ORAL | Status: DC | PRN

## 2012-11-25 NOTE — Discharge Summary (Signed)
Agree with NP

## 2012-11-25 NOTE — Progress Notes (Signed)
Pt ambulated multiple times today. Pt tolerating pain.

## 2012-11-25 NOTE — Discharge Summary (Signed)
Physician Discharge Summary  Patient ID: Sheri Hernandez MRN: [id number] DOB/AGE: Jan 03, 1956 57 y.o.  Admit date: 11/22/2012 Discharge date: 11/25/2012  Admission Diagnoses:  Discharge Diagnoses:  Active Problems:   * No active hospital problems. *   Discharged Condition: good  Hospital Course: Status post left laparoscopic cytoreductive nephrectomy for metastatic RCC. Hospital course has been uncomplicated. Pain controlled at time of discharge with po Oxycodone 5/325 mg 1-2 po Q4-6 hrs. Passing gas and had bowel movement at time of discharge.  Consults: None  Significant Diagnostic Studies: labs: and radiology:   Treatments: IV hydration, antibiotics: Ancef, analgesia: B&O supp, Hydromorphone, and Oxycodone and surgery: left laparoscopic cytoreductive nephrectomy   Discharge Exam: Blood pressure 115/63, pulse 88, temperature 98 F (36.7 C), temperature source Oral, resp. rate 18, height 5\' 8"  (1.727 m), weight 70.852 kg (156 lb 3.2 oz), SpO2 94.00%. General appearance: alert, cooperative and no distress Resp: clear to auscultation bilaterally Cardio: regular rate and rhythm GI: soft, non-tender; bowel sounds normal; no masses,  no organomegaly Incision/Wound: incisions CID. No redness, swelling, or drainage at time of discharge  Disposition: 01-Home or Self Care     Medication List    STOP taking these medications       acetaminophen 500 MG tablet  Commonly known as:  TYLENOL     acetaminophen-codeine 300-30 MG per tablet  Commonly known as:  TYLENOL #3     aspirin EC 81 MG tablet      TAKE these medications       cholecalciferol 1000 UNITS tablet  Commonly known as:  VITAMIN D  Take 1,000 Units by mouth daily.     docusate sodium 100 MG capsule  Commonly known as:  COLACE  Take 1 capsule (100 mg total) by mouth 2 (two) times daily.     hydrochlorothiazide 12.5 MG capsule  Commonly known as:  MICROZIDE  Take 12.5 mg by mouth daily before breakfast.     oxyCODONE-acetaminophen 5-325 MG per tablet  Commonly known as:  ROXICET  Take 1 tablet by mouth every 4 (four) hours as needed for pain.           Follow-up Information   Follow up with BORDEN,LES, MD On 12/04/2012. (at 10:45)    Contact information:   [address] [place] [address] [phone]       Signed: Jetta Lout 11/25/2012, 8:51 AM

## 2012-11-25 NOTE — Progress Notes (Signed)
Chaplain Note: Made a follow up visit to this patient.  She is recovering from her kidney being removed.  She was in good spirits and stated that while she had the kidney removed, growths have been found on her lungs.  She is not sure if it is cancer, another form of cancer than what was on her kidneys or metastasis. When she gets stronger, she states she will be having a biopsy done.  Chaplain felt there may have been a level of fear driving her humor and good spirits, and that is also reflective of her basic personality.  It was nice to put the face on the Thursday morning phone call, and the patient was glad the Chaplain came to visit and requested future visit(s).  Rema Jasmine, Chaplain Pager: [phone]

## 2012-11-25 NOTE — Progress Notes (Signed)
Pt indirectly expressed that I was not obtaining pain medications quick enough for her. I explained that I was coming as quick as I possibly could. Pt did not like my response. I also explained that since her pain medication was every two hours I was not to give it a minute sooner then when it was due.

## 2012-11-25 NOTE — Progress Notes (Signed)
Chaplain Note: Followed up visit.  Patient was sitting in chair and expressed she had done some walking today.  She was again in good spirits, and we spoke of diverse things, mostly general in nature.  She shared her experiences of staff and other aspects attached to staying in the hospital.  We talked some sports, knitting, her husband's visit today.  Patient is encouraged by visits and was seemed very excited about a discharge on Sunday.  Chaplain offered presence, encouragement and company.  Rema Jasmine, Chaplain Pager: [phone]

## 2012-11-25 NOTE — Progress Notes (Signed)
3 Days Post-Op Subjective: Patient reports Pt is up sitting in chair dressed ready to go home. Is passing gas so abdominal pain is much improved.   Objective: Vital signs in last 24 hours: Temp:  [98 F (36.7 C)-100.4 F (38 C)] 98 F (36.7 C) (03/23 0622) Pulse Rate:  [88-103] 88 (03/23 0622) Resp:  [17-18] 18 (03/23 0622) BP: (115-130)/(63-75) 115/63 mmHg (03/23 0622) SpO2:  [94 %-100 %] 94 % (03/23 0622)  Intake/Output from previous day: 03/22 0701 - 03/23 0700 In: 480 [P.O.:480] Out: 800 [Urine:800] Intake/Output this shift:    Physical Exam:  General:alert, cooperative and no distress GI: soft, non tender, normal bowel sounds, no palpable masses, no organomegaly, no inguinal hernia Female genitalia: not indicated Resp: clear to auscultation bilaterally Cardio: regular rate and rhythm  Lab Results:  Recent Labs  11/22/12 1046 11/23/12 0420  HGB 10.5* 9.7*  HCT 32.7* 30.3*   BMET  Recent Labs  11/23/12 0420 11/24/12 0501  NA 137 135  K 3.9 3.7  CL 101 97  CO2 29 24  GLUCOSE 120* 126*  BUN 7 7  CREATININE 0.74 0.69  CALCIUM 9.1 9.5   No results found for this basename: LABPT, INR,  in the last 72 hours No results found for this basename: LABURIN,  in the last 72 hours Results for orders placed during the hospital encounter of 11/14/12  SURGICAL PCR SCREEN     Status: None   Collection Time    11/14/12 11:08 AM      Result Value Range Status   MRSA, PCR NEGATIVE  NEGATIVE Final   Staphylococcus aureus NEGATIVE  NEGATIVE Final   Comment:            The Xpert SA Assay (FDA     approved for NASAL specimens     in patients over 39 years of age),     is one component of     a comprehensive surveillance     program.  Test performance has     been validated by [employer] for patients greater     than or equal to 35 year old.     It is not intended     to diagnose infection nor to     guide or monitor treatment.    Studies/Results: No  results found.  Assessment/Plan: Status post left laparoscopic cytoreductive nephrectomy for metastatic RCC. Will discharge home and f/u as scheduled w/Dr. Laverle Patter  LOS: 3 days   [facility] 11/25/2012, 8:45 AM

## 2012-11-26 ENCOUNTER — Encounter: Payer: Self-pay | Admitting: Family Medicine

## 2012-11-26 DIAGNOSIS — Z905 Acquired absence of kidney: Secondary | ICD-10-CM | POA: Insufficient documentation

## 2012-12-07 ENCOUNTER — Telehealth: Payer: Self-pay | Admitting: Oncology

## 2012-12-07 ENCOUNTER — Other Ambulatory Visit: Payer: Self-pay | Admitting: *Deleted

## 2012-12-07 DIAGNOSIS — N2889 Other specified disorders of kidney and ureter: Secondary | ICD-10-CM

## 2012-12-07 NOTE — Telephone Encounter (Signed)
s.w. pt to r/s appt...pt did not want to r/s bc [facility] took out kidney and did a biopsy of her lung.Sheri KitchenMarland Hernandez

## 2012-12-25 ENCOUNTER — Telehealth: Payer: Self-pay | Admitting: Family Medicine

## 2012-12-25 NOTE — Telephone Encounter (Signed)
Please call in norco 5/325 q 6 hrs prn pain #30

## 2012-12-25 NOTE — Telephone Encounter (Signed)
Tylenol should be okay (500 mg q 4 hours prn), does she need something stronger for pain like vicodin.

## 2012-12-25 NOTE — Telephone Encounter (Signed)
Would like for some vicodin on hand in case she needs it

## 2012-12-25 NOTE — Telephone Encounter (Signed)
taking chemotherapy pills, dog knocked her over last pm and now in some pain, what can she take with the chemo pills to help with the pain

## 2012-12-26 ENCOUNTER — Ambulatory Visit (INDEPENDENT_AMBULATORY_CARE_PROVIDER_SITE_OTHER): Payer: 59 | Admitting: Family Medicine

## 2012-12-26 VITALS — BP 136/94

## 2012-12-26 DIAGNOSIS — I10 Essential (primary) hypertension: Secondary | ICD-10-CM

## 2012-12-26 NOTE — Progress Notes (Signed)
Pt just here for BP check with new machine she recently purchased. BP stated above and will inform Dr. Tanya Nones tomorrow of BP readings.

## 2012-12-27 ENCOUNTER — Telehealth: Payer: Self-pay | Admitting: Family Medicine

## 2012-12-27 NOTE — Telephone Encounter (Addendum)
No note  Pt asking about BP meds

## 2012-12-27 NOTE — Telephone Encounter (Signed)
Pt got pain med from her cancer doctor

## 2012-12-27 NOTE — Telephone Encounter (Signed)
Called patient no answer.  Left mess on voice mail to take two HCTZ if BP over 140/90.  Have pharmacy send refill request if needs more.

## 2012-12-27 NOTE — Telephone Encounter (Signed)
She can double HCTZ to 25 mg poqday if BP is greater than 140/90

## 2012-12-27 NOTE — Telephone Encounter (Signed)
Pt just started "Sutent" from onocologist at [facility].  One of the side effects is high blood pressure.    BP 136/94 here yesterday when she brought her machine ion for [place] to check.  Today at home on her machine she is getting 127/105??  Very concerned because leaving to go out of town in early AM.  Take only HCTS 12.5 for BP right now.

## 2012-12-31 ENCOUNTER — Encounter: Payer: 59 | Admitting: Family Medicine

## 2012-12-31 ENCOUNTER — Telehealth: Payer: Self-pay | Admitting: Family Medicine

## 2012-12-31 NOTE — Telephone Encounter (Signed)
I would give a total of two weeks,  If bp remains elevated, I would add norvasc to HCTZ.

## 2012-12-31 NOTE — Progress Notes (Signed)
This encounter was created in error - please disregard.

## 2013-01-01 MED ORDER — HYDROCHLOROTHIAZIDE 25 MG PO TABS: 25.0000 mg | ORAL_TABLET | Freq: Every day | ORAL | Status: DC

## 2013-01-01 NOTE — Telephone Encounter (Signed)
Pt aware and rx sent to pharmacy. 

## 2013-01-11 ENCOUNTER — Encounter: Payer: Self-pay | Admitting: Physician Assistant

## 2013-01-11 ENCOUNTER — Ambulatory Visit (INDEPENDENT_AMBULATORY_CARE_PROVIDER_SITE_OTHER): Payer: 59 | Admitting: Physician Assistant

## 2013-01-11 VITALS — BP 150/110 | HR 74 | Temp 97.8°F | Resp 18 | Ht 67.5 in | Wt 151.0 lb

## 2013-01-11 DIAGNOSIS — I10 Essential (primary) hypertension: Secondary | ICD-10-CM

## 2013-01-11 DIAGNOSIS — M7710 Lateral epicondylitis, unspecified elbow: Secondary | ICD-10-CM

## 2013-01-11 DIAGNOSIS — M542 Cervicalgia: Secondary | ICD-10-CM

## 2013-01-11 MED ORDER — CYCLOBENZAPRINE HCL 10 MG PO TABS: 10.0000 mg | ORAL_TABLET | Freq: Three times a day (TID) | ORAL | Status: DC | PRN

## 2013-01-11 MED ORDER — MELOXICAM 15 MG PO TABS: 15.0000 mg | ORAL_TABLET | Freq: Every day | ORAL | Status: DC

## 2013-01-11 MED ORDER — AMLODIPINE BESYLATE 5 MG PO TABS: 5.0000 mg | ORAL_TABLET | Freq: Every day | ORAL | Status: DC

## 2013-01-11 MED ORDER — BENAZEPRIL HCL 10 MG PO TABS: 10.0000 mg | ORAL_TABLET | Freq: Every day | ORAL | Status: DC

## 2013-01-11 NOTE — Progress Notes (Signed)
Patient ID: Sheri Hernandez MRN: [id number], DOB: 1956/07/07, 57 y.o. Date of Encounter: @DATE @  Chief Complaint:  Chief Complaint  Patient presents with  . Arm Pain    both    HPI: 57 y.o. year old female  presents with:  1- Pain in both arms.: Reports that about 3 weeks ago her dog suddenly ran-was chasing a cat-pulled her across the yard. Says " I guess I should have let go instead'-It pulled both of my arms and "wrenched my right arm." Feels as if something is pressing down on her shoulders. Achy pain in both upper arms.   2-HTN: Has been checking at home-getting 155/105-180/105.  Taking HCTZ 25 mg QD for BP.    Past Medical History  Diagnosis Date  . Papilloma of breast 2012    left. lumpectomy  . Anxiety   . Complication of anesthesia     hard time waking up 20 yrs. ago when having wisdon teeth removed  . Hypertension   . Shortness of breath     WITH EXERTION  . 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  . Lung mass     LUNG MASSES--COUGH-BIOPSY NON-DIAGNOSTIC.  HX OF MEDIASTINAL MASS AND LUNG MASSES AGE 62 -NEGATIVE BIOSPY-BUT GIVEN DIAGNOSIS OF SARCOIDOSIS.  WORK UP CONTINUING ON DIAGNOSIS FOR PT'S LUNG MASSES--SHE HAS BIOPSY PROVEN KIDNEY CANCER  . S/p nephrectomy     left, due to metastatic RCC, 3/14     Home Meds: See attached medication section for current medication list. Any medications entered into computer today will not appear on this note's list. The medications listed below were entered prior to today. Current Outpatient Prescriptions on File Prior to Visit  Medication Sig Dispense Refill  . cholecalciferol (VITAMIN D) 1000 UNITS tablet Take 1,000 Units by mouth daily.      . hydrochlorothiazide (HYDRODIURIL) 25 MG tablet Take 1 tablet (25 mg total) by  mouth daily.  30 tablet  5  . oxyCODONE-acetaminophen (PERCOCET/ROXICET) 5-325 MG per tablet Take 1-2 tablets by mouth every 4 (four) hours as needed.  30 tablet  0   No current facility-administered medications on file prior to visit.    Allergies: No Known Allergies  History   Social History  . Marital Status: Married    Spouse Name: N/A    Number of Children: 0  . Years of Education: N/A   Occupational History  . 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    Social History Main Topics  . Smoking status: Former Smoker -- 1.50 packs/day for 40 years    Types: Cigarettes    Quit date: 09/18/2012  . Smokeless tobacco: Never Used     Comment: PT STATES THAT SHE QUIT SMOKING 09/27/11. PATIENT STATES THAT SHE HAS NOT "INHALED" IN LAST 4 YEARS.   Marland Kitchen Alcohol Use: Yes     Comment: OCC  . Drug Use: No  . Sexually Active: Not on file   Other Topics Concern  . Not on file   Social History Narrative  . No narrative on file    Family History  Problem Relation Age of Onset  . Heart disease Mother   . Heart disease Father     heart attack  . Heart disease  Brother   . Stroke Brother   . Cancer Maternal Aunt     colon, breast in different aunts/uncles  . Cancer Maternal Uncle     skin, stomach  . Heart disease Maternal Grandmother   . Cancer Maternal Grandfather     cancer     Review of Systems:  See HPI for pertinent ROS. All other ROS negative.    Physical Exam: Blood pressure 150/110, pulse 74, temperature 97.8 F (36.6 C), temperature source Oral, resp. rate 18, height 5' 7.5" (1.715 m), weight 151 lb (68.493 kg)., Body mass index is 23.29 kg/(m^2). General: WNWD WF.  Appears in no acute distress. Neck: She grimaces and jumps every time I palpate any area of her neck on both sides. Says it hurts. ROM si slightly decreased sec to pain and muscle tightness  Lungs: Clear bilaterally to auscultation without wheezes, rales, or rhonchi. Breathing is unlabored. Heart: RRR  with S1 S2. No murmurs, rubs, or gallops. Musculoskeletal:  Bilateral Lateral Epicondyles are severely tender with palpation. Medial epicondyles with no tenderness.  Bilateral Shoulder Exam is normal. Forward extension, Empty can, Abduction all nml bilaterally. Forearm strength with abduction and adduction are 5/5 bilaterally. Neuro: Alert and oriented X 3. Moves all extremities spontaneously. Gait is normal. CNII-XII grossly in tact. Psych:  Responds to questions appropriately with a normal affect.     ASSESSMENT AND PLAN:  57 y.o. year old female with  1. Lateral epicondylitis, unspecified laterality Streatch: Demonstrated stretch to her. Wear "Tennis Elbow strap" - meloxicam (MOBIC) 15 MG tablet; Take 1 tablet (15 mg total) by mouth daily.  Dispense: 30 tablet; Refill: 0  2. Neck pain, bilateral Heat, Stretch, ROM - cyclobenzaprine (FLEXERIL) 10 MG tablet; Take 1 tablet (10 mg total) by mouth 3 (three) times daily as needed for muscle spasms.  Dispense: 30 tablet; Refill: 0 - meloxicam (MOBIC) 15 MG tablet; Take 1 tablet (15 mg total) by mouth daily.  Dispense: 30 tablet; Refill: 0  3. HTN (hypertension) Cont HCTZ 25mg  QD. Add: - benazepril (LOTENSIN) 10 MG tablet; Take 1 tablet (10 mg total) by mouth daily.  Dispense: 90 tablet; Refill: 3 - amLODipine (NORVASC) 5 MG tablet; Take 1 tablet (5 mg total) by mouth daily.  Dispense: 90 tablet; Refill: 3 BMET nml 10/13.  RTC to f/u in 2 weeks. Keep log of home BP readings in interim.  Signed, [address] [place], [place], [facility] 01/11/2013 11:55 AM

## 2013-01-22 ENCOUNTER — Other Ambulatory Visit: Payer: Self-pay | Admitting: Dermatology

## 2013-03-05 ENCOUNTER — Other Ambulatory Visit: Payer: 59

## 2013-03-05 DIAGNOSIS — C649 Malignant neoplasm of unspecified kidney, except renal pelvis: Secondary | ICD-10-CM

## 2013-03-05 DIAGNOSIS — I10 Essential (primary) hypertension: Secondary | ICD-10-CM

## 2013-03-06 LAB — COMPLETE METABOLIC PANEL WITH GFR
AST: 36 U/L (ref 0–37)
Alkaline Phosphatase: 59 U/L (ref 39–117)
BUN: 20 mg/dL (ref 6–23)
Creat: 0.78 mg/dL (ref 0.50–1.10)
Total Bilirubin: 0.8 mg/dL (ref 0.3–1.2)

## 2013-03-06 LAB — CBC WITH DIFFERENTIAL/PLATELET
Basophils Absolute: 0 10*3/uL (ref 0.0–0.1)
Basophils Relative: 1 % (ref 0–1)
Eosinophils Relative: 4 % (ref 0–5)
HCT: 36.7 % (ref 36.0–46.0)
MCHC: 34.3 g/dL (ref 30.0–36.0)
Monocytes Absolute: 0.3 10*3/uL (ref 0.1–1.0)
Neutro Abs: 2.2 10*3/uL (ref 1.7–7.7)
RDW: 21.6 % — ABNORMAL HIGH (ref 11.5–15.5)

## 2013-04-11 ENCOUNTER — Encounter: Payer: Self-pay | Admitting: Physician Assistant

## 2013-04-11 ENCOUNTER — Ambulatory Visit (INDEPENDENT_AMBULATORY_CARE_PROVIDER_SITE_OTHER): Payer: 59 | Admitting: Physician Assistant

## 2013-04-11 VITALS — BP 144/90 | HR 88 | Temp 97.9°F | Resp 20 | Wt 160.0 lb

## 2013-04-11 DIAGNOSIS — Z5181 Encounter for therapeutic drug level monitoring: Secondary | ICD-10-CM

## 2013-04-11 DIAGNOSIS — M79671 Pain in right foot: Secondary | ICD-10-CM

## 2013-04-11 DIAGNOSIS — M79609 Pain in unspecified limb: Secondary | ICD-10-CM

## 2013-04-11 NOTE — Progress Notes (Signed)
Patient ID: Sheri Hernandez MRN: [id number], DOB: 09-Jan-1956, 57 y.o. Date of Encounter: 04/11/2013, 6:43 PM    Chief Complaint:  Chief Complaint  Patient presents with  . c/o severe pain bottom of right foot x 5 days  last two days    saww podiatrist told to come here     HPI: 57 y.o. year old white female reports that she began to experience pain in bottom of feet 5 days ago-"felt like they were raw on the bottom". Also had swelling of both feet and has developed tiny red dots on skin of lower legs/ankles. Saw Podiatrist yesterda-did XRays that "showed swelling." Was told to f/u with PCP to see if BP meds may be cause. Also to f/u with oncologist to see if chemotherapy the cause. Has appt with Oncology next week.  Says pain is MUCH better, as well as swelling. Says that even yesterday, had severe pain when Podiatrist palpated foot. Also says "they were double the size they are now" sec to swelling.   Also is to have routine labs-ordered by oncologist at [facility]. Wants to have lab drawn while here-has order.      Home Meds: See attached medication section for any medications that were entered at today's visit. The computer does not put those onto this list.The following list is a list of meds entered prior to today's visit.   Current Outpatient Prescriptions on File Prior to Visit  Medication Sig Dispense Refill  . amLODipine (NORVASC) 5 MG tablet Take 1 tablet (5 mg total) by mouth daily.  90 tablet  3  . hydrochlorothiazide (HYDRODIURIL) 25 MG tablet Take 1 tablet (25 mg total) by mouth daily.  30 tablet  5  . benazepril (LOTENSIN) 10 MG tablet Take 1 tablet (10 mg total) by mouth daily.  90 tablet  3  . cholecalciferol (VITAMIN D) 1000 UNITS tablet Take 1,000 Units by mouth daily.      . cyclobenzaprine (FLEXERIL) 10 MG tablet Take 1 tablet (10 mg total) by mouth 3 (three) times daily as needed for muscle spasms.  30 tablet  0  . meloxicam (MOBIC) 15 MG tablet Take 1 tablet (15 mg  total) by mouth daily.  30 tablet  0  . oxyCODONE-acetaminophen (PERCOCET/ROXICET) 5-325 MG per tablet Take 1-2 tablets by mouth every 4 (four) hours as needed.  30 tablet  0   No current facility-administered medications on file prior to visit.    Allergies: No Known Allergies    Review of Systems: See HPI for pertinent ROS. All other ROS negative.    Physical Exam: Blood pressure 144/90, pulse 88, temperature 97.9 F (36.6 C), temperature source Oral, resp. rate 20, weight 160 lb (72.576 kg)., Body mass index is 24.68 kg/(m^2). General:  WF. Appears in no acute distress. Lungs: Clear bilaterally to auscultation without wheezes, rales, or rhonchi. Breathing is unlabored. Heart: Regular rhythm. No murmurs, rubs, or gallops. Msk:  Strength and tone normal for age. Extremities/Skin: NO edema now. I palpated bottoms of feet and she reports no/very minimal pain with palpation now. Tiny petichiae (each approx 2-3 mm "dots") at ankle level bilaterally. No other are of skin with petichiae. No other type of rash seen on inspection.  Neuro: Alert and oriented X 3. Moves all extremities spontaneously. Gait is normal. CNII-XII grossly in tact. Psych:  Responds to questions appropriately with a normal affect.     ASSESSMENT AND PLAN:  57 y.o. year old female with  1. Foot pain,  bilateral I do not think her signs/symptoms are related to BP meds.  Probably secondary to chemotherapy. She says she takes a pill daily (not on medication list). Has appt to see her Oncologist within one week.   2. Encounter for medication monitoring She has lab order from Oncologist-wants to have this drawn here-will fax results to Onc. - CBC with Differential - COMPLETE METABOLIC PANEL WITH GFR   Signed, [address] [place], [place], [facility] 04/11/2013 6:43 PM

## 2013-04-12 LAB — CBC WITH DIFFERENTIAL/PLATELET
Eosinophils Relative: 4 % (ref 0–5)
HCT: 40.1 % (ref 36.0–46.0)
Lymphocytes Relative: 42 % (ref 12–46)
Lymphs Abs: 1.8 10*3/uL (ref 0.7–4.0)
MCV: 93.7 fL (ref 78.0–100.0)
Monocytes Absolute: 0.3 10*3/uL (ref 0.1–1.0)
RBC: 4.28 MIL/uL (ref 3.87–5.11)
WBC: 4.3 10*3/uL (ref 4.0–10.5)

## 2013-04-12 LAB — COMPLETE METABOLIC PANEL WITH GFR
ALT: 30 U/L (ref 0–35)
Albumin: 3.9 g/dL (ref 3.5–5.2)
BUN: 30 mg/dL — ABNORMAL HIGH (ref 6–23)
CO2: 29 mEq/L (ref 19–32)
Calcium: 9.4 mg/dL (ref 8.4–10.5)
Chloride: 104 mEq/L (ref 96–112)
Creat: 0.92 mg/dL (ref 0.50–1.10)
GFR, Est African American: 81 mL/min

## 2013-05-23 ENCOUNTER — Other Ambulatory Visit: Payer: 59

## 2013-05-23 DIAGNOSIS — N2889 Other specified disorders of kidney and ureter: Secondary | ICD-10-CM

## 2013-05-24 LAB — CBC WITH DIFFERENTIAL/PLATELET
Basophils Absolute: 0 10*3/uL (ref 0.0–0.1)
Basophils Relative: 1 % (ref 0–1)
Eosinophils Absolute: 0.1 10*3/uL (ref 0.0–0.7)
Lymphs Abs: 2 10*3/uL (ref 0.7–4.0)
MCH: 32.9 pg (ref 26.0–34.0)
Neutrophils Relative %: 48 % (ref 43–77)
Platelets: 150 10*3/uL (ref 150–400)
RBC: 4.32 MIL/uL (ref 3.87–5.11)
RDW: 16.9 % — ABNORMAL HIGH (ref 11.5–15.5)

## 2013-05-24 LAB — COMPLETE METABOLIC PANEL WITH GFR
ALT: 22 U/L (ref 0–35)
AST: 26 U/L (ref 0–37)
Alkaline Phosphatase: 55 U/L (ref 39–117)
CO2: 29 mEq/L (ref 19–32)
Creat: 0.83 mg/dL (ref 0.50–1.10)
Sodium: 140 mEq/L (ref 135–145)
Total Bilirubin: 0.6 mg/dL (ref 0.3–1.2)
Total Protein: 6.8 g/dL (ref 6.0–8.3)

## 2013-05-31 ENCOUNTER — Encounter: Payer: Self-pay | Admitting: Family Medicine

## 2013-06-29 ENCOUNTER — Other Ambulatory Visit: Payer: Self-pay | Admitting: Family Medicine

## 2013-07-01 NOTE — Telephone Encounter (Signed)
Ok to refill 

## 2013-07-01 NOTE — Telephone Encounter (Signed)
Meds refilled.

## 2013-07-01 NOTE — Telephone Encounter (Signed)
Ok x 11

## 2013-07-04 ENCOUNTER — Other Ambulatory Visit: Payer: 59

## 2013-07-04 DIAGNOSIS — R222 Localized swelling, mass and lump, trunk: Secondary | ICD-10-CM

## 2013-07-04 DIAGNOSIS — N289 Disorder of kidney and ureter, unspecified: Secondary | ICD-10-CM

## 2013-07-05 LAB — CBC WITH DIFFERENTIAL/PLATELET
Basophils Relative: 1 % (ref 0–1)
Eosinophils Absolute: 0.2 10*3/uL (ref 0.0–0.7)
HCT: 42.6 % (ref 36.0–46.0)
Hemoglobin: 14.7 g/dL (ref 12.0–15.0)
Lymphs Abs: 2.1 10*3/uL (ref 0.7–4.0)
MCH: 32.4 pg (ref 26.0–34.0)
MCHC: 34.5 g/dL (ref 30.0–36.0)
Monocytes Absolute: 0.4 10*3/uL (ref 0.1–1.0)
Monocytes Relative: 8 % (ref 3–12)
RBC: 4.54 MIL/uL (ref 3.87–5.11)

## 2013-07-05 LAB — COMPLETE METABOLIC PANEL WITH GFR
Albumin: 4.1 g/dL (ref 3.5–5.2)
Alkaline Phosphatase: 57 U/L (ref 39–117)
BUN: 16 mg/dL (ref 6–23)
CO2: 30 mEq/L (ref 19–32)
GFR, Est African American: >89 mL/min
GFR, Est Non African American: 86 mL/min
Glucose, Bld: 81 mg/dL (ref 70–99)
Potassium: 4 mEq/L (ref 3.5–5.3)
Sodium: 141 mEq/L (ref 135–145)
Total Bilirubin: 0.9 mg/dL (ref 0.3–1.2)
Total Protein: 6.6 g/dL (ref 6.0–8.3)

## 2013-07-08 ENCOUNTER — Encounter: Payer: Self-pay | Admitting: Family Medicine

## 2013-07-19 ENCOUNTER — Other Ambulatory Visit: Payer: 59

## 2013-09-18 ENCOUNTER — Ambulatory Visit: Payer: 59 | Admitting: Physician Assistant

## 2013-09-19 ENCOUNTER — Ambulatory Visit (INDEPENDENT_AMBULATORY_CARE_PROVIDER_SITE_OTHER): Payer: 59 | Admitting: Physician Assistant

## 2013-09-19 ENCOUNTER — Encounter: Payer: Self-pay | Admitting: Physician Assistant

## 2013-09-19 VITALS — BP 120/86 | HR 80 | Temp 97.9°F | Resp 18 | Wt 180.0 lb

## 2013-09-19 DIAGNOSIS — M7700 Medial epicondylitis, unspecified elbow: Secondary | ICD-10-CM

## 2013-09-19 DIAGNOSIS — M7701 Medial epicondylitis, right elbow: Secondary | ICD-10-CM

## 2013-09-19 MED ORDER — PREDNISONE 20 MG PO TABS: ORAL_TABLET | ORAL | Status: DC

## 2013-09-19 NOTE — Progress Notes (Signed)
Patient ID: Sheri Hernandez MRN: [id number], DOB: [birth year], 58 y.o. Date of Encounter: 09/19/2013, 9:01 AM    Chief Complaint:  Chief Complaint  Patient presents with  . rt arm difficulty moving/painful    severe pain in elbow     HPI: 58 y.o. year old female reports that this problem started 7 days ago.  She is unable to fully extend her right elbow secondary to pain around the elbow including the muscles in her upper arm.  She says that she has had no fall trauma or injury. Also does not recall doing any repetitive motion or unusual activity with that arm. Says that she sometimes does sleep with that arm tucked between her legs and underneath her body. Doesn't know whether she may have slept on it in a bad position.     Home Meds: See attached medication section for any medications that were entered at today's visit. The computer does not put those onto this list.The following list is a list of meds entered prior to today's visit.   Current Outpatient Prescriptions on File Prior to Visit  Medication Sig Dispense Refill  . amLODipine (NORVASC) 5 MG tablet Take 1 tablet (5 mg total) by mouth daily.  90 tablet  3  . benazepril (LOTENSIN) 10 MG tablet Take 1 tablet (10 mg total) by mouth daily.  90 tablet  3  . cholecalciferol (VITAMIN D) 1000 UNITS tablet Take 1,000 Units by mouth daily.      . cyclobenzaprine (FLEXERIL) 10 MG tablet Take 1 tablet (10 mg total) by mouth 3 (three) times daily as needed for muscle spasms.  30 tablet  0  . hydrochlorothiazide (HYDRODIURIL) 25 MG tablet TAKE 1 TABLET (25 MG TOTAL) BY MOUTH DAILY.  30 tablet  11  . meloxicam (MOBIC) 15 MG tablet Take 1 tablet (15 mg total) by mouth daily.  30 tablet  0  . oxyCODONE-acetaminophen (PERCOCET/ROXICET) 5-325 MG per tablet Take 1-2 tablets by mouth every 4 (four) hours as needed.  30 tablet  0   No current facility-administered medications on file prior to visit.    Allergies: No Known Allergies     Review of Systems: See HPI for pertinent ROS. All other ROS negative.    Physical Exam: Blood pressure 120/86, pulse 80, temperature 97.9 F (36.6 C), temperature source Oral, resp. rate 18, weight 180 lb (81.647 kg)., Body mass index is 27.76 kg/(m^2). General:  WNWD WF. Appears in no acute distress. Lungs: Clear bilaterally to auscultation without wheezes, rales, or rhonchi. Breathing is unlabored. Heart: Regular rhythm. No murmurs, rubs, or gallops. Msk:  Strength and tone normal for age. Inspection of right elbow: Inspection is normal. There is no swelling. Also no enlargement of the olecranon bursa. No erythema. No warmth.  She has severe tenderness towards the medial epicondyle. This tenderness extends superiorly up the medial aspect of the upper arm toward the tricep.  There is minimal tenderness at the medial epicondyles. Minimal tenderness with palpation of the bicep area. Also minimal tenderness toward the forearm. She can completely flex at the elbow. However she can only extend to approximately 120 degrees.  Neuro: Alert and oriented X 3. Moves all extremities spontaneously. Gait is normal. CNII-XII grossly in tact. Psych:  Responds to questions appropriately with a normal affect.     ASSESSMENT AND PLAN:  58 y.o. year old female with  1. Medial epicondylitis of right elbow - predniSONE (DELTASONE) 20 MG tablet; Take 3 daily for 2  days, then 2 daily for 2 days, then 1 daily for 2 days.  Dispense: 12 tablet; Refill: 0 As the pain decreases, then she needs to work on extending the arm and regaining range of motion.  If symptoms do not resolve by completion of the prednisone then followup.  Signed, [address] [place], [place], [facility] 09/19/2013 9:01 AM

## 2013-10-03 ENCOUNTER — Emergency Department (HOSPITAL_COMMUNITY): Payer: 59

## 2013-10-03 ENCOUNTER — Ambulatory Visit (INDEPENDENT_AMBULATORY_CARE_PROVIDER_SITE_OTHER): Payer: 59 | Admitting: Family Medicine

## 2013-10-03 ENCOUNTER — Encounter (HOSPITAL_COMMUNITY): Payer: Self-pay | Admitting: Emergency Medicine

## 2013-10-03 ENCOUNTER — Other Ambulatory Visit: Payer: 59

## 2013-10-03 ENCOUNTER — Encounter: Payer: Self-pay | Admitting: Family Medicine

## 2013-10-03 ENCOUNTER — Emergency Department (HOSPITAL_COMMUNITY)
Admission: EM | Admit: 2013-10-03 | Discharge: 2013-10-03 | Disposition: A | Discharge status: Home / Self Care | Payer: 59 | Attending: Emergency Medicine | Admitting: Emergency Medicine

## 2013-10-03 VITALS — BP 100/72 | HR 100 | Temp 97.2°F | Resp 24

## 2013-10-03 DIAGNOSIS — Z8709 Personal history of other diseases of the respiratory system: Secondary | ICD-10-CM | POA: Insufficient documentation

## 2013-10-03 DIAGNOSIS — I10 Essential (primary) hypertension: Secondary | ICD-10-CM | POA: Insufficient documentation

## 2013-10-03 DIAGNOSIS — Z8742 Personal history of other diseases of the female genital tract: Secondary | ICD-10-CM | POA: Insufficient documentation

## 2013-10-03 DIAGNOSIS — Z9089 Acquired absence of other organs: Secondary | ICD-10-CM | POA: Insufficient documentation

## 2013-10-03 DIAGNOSIS — R079 Chest pain, unspecified: Secondary | ICD-10-CM | POA: Insufficient documentation

## 2013-10-03 DIAGNOSIS — Z79899 Other long term (current) drug therapy: Secondary | ICD-10-CM | POA: Insufficient documentation

## 2013-10-03 DIAGNOSIS — Z8659 Personal history of other mental and behavioral disorders: Secondary | ICD-10-CM | POA: Insufficient documentation

## 2013-10-03 DIAGNOSIS — IMO0002 Reserved for concepts with insufficient information to code with codable children: Secondary | ICD-10-CM | POA: Insufficient documentation

## 2013-10-03 DIAGNOSIS — R1013 Epigastric pain: Secondary | ICD-10-CM

## 2013-10-03 DIAGNOSIS — Z87891 Personal history of nicotine dependence: Secondary | ICD-10-CM | POA: Insufficient documentation

## 2013-10-03 LAB — CBC WITH DIFFERENTIAL/PLATELET
Basophils Absolute: 0.1 10*3/uL (ref 0.0–0.1)
Basophils Relative: 1 % (ref 0–1)
Eosinophils Absolute: 0.1 10*3/uL (ref 0.0–0.7)
Eosinophils Relative: 1 % (ref 0–5)
HCT: 40 % (ref 36.0–46.0)
Hemoglobin: 13.6 g/dL (ref 12.0–15.0)
Lymphocytes Relative: 25 % (ref 12–46)
Lymphs Abs: 2.4 10*3/uL (ref 0.7–4.0)
MCH: 31.3 pg (ref 26.0–34.0)
MCHC: 34 g/dL (ref 30.0–36.0)
MCV: 92.2 fL (ref 78.0–100.0)
Monocytes Absolute: 0.9 10*3/uL (ref 0.1–1.0)
Monocytes Relative: 9 % (ref 3–12)
Neutro Abs: 6.4 10*3/uL (ref 1.7–7.7)
Neutrophils Relative %: 65 % (ref 43–77)
Platelets: 198 10*3/uL (ref 150–400)
RBC: 4.34 MIL/uL (ref 3.87–5.11)
RDW: 14.9 % (ref 11.5–15.5)
WBC: 9.9 10*3/uL (ref 4.0–10.5)

## 2013-10-03 LAB — BASIC METABOLIC PANEL
BUN: 16 mg/dL (ref 6–23)
CO2: 29 mEq/L (ref 19–32)
Calcium: 9.8 mg/dL (ref 8.4–10.5)
Chloride: 95 mEq/L — ABNORMAL LOW (ref 96–112)
Creatinine, Ser: 0.75 mg/dL (ref 0.50–1.10)
GFR calc Af Amer: >90 mL/min (ref >90)
GFR calc non Af Amer: >90 mL/min (ref >90)
Glucose, Bld: 123 mg/dL — ABNORMAL HIGH (ref 70–99)
Potassium: 3.2 mEq/L — ABNORMAL LOW (ref 3.7–5.3)
Sodium: 140 mEq/L (ref 137–147)

## 2013-10-03 LAB — TROPONIN I: Troponin I: <0.3 ng/mL (ref <0.30)

## 2013-10-03 MED ORDER — MORPHINE SULFATE 4 MG/ML IJ SOLN
4.0000 mg | Freq: Once | INTRAMUSCULAR | Status: AC
  Administered 2013-10-03: 4 mg via INTRAVENOUS
  Filled 2013-10-03: qty 1

## 2013-10-03 MED ORDER — HYDROMORPHONE HCL PF 1 MG/ML IJ SOLN
1.0000 mg | Freq: Once | INTRAMUSCULAR | Status: AC
  Administered 2013-10-03: 1 mg via INTRAVENOUS
  Filled 2013-10-03: qty 1

## 2013-10-03 MED ORDER — GI COCKTAIL ~~LOC~~
30.0000 mL | Freq: Once | ORAL | Status: AC
  Administered 2013-10-03: 30 mL via ORAL
  Filled 2013-10-03: qty 30

## 2013-10-03 MED ORDER — IOHEXOL 350 MG/ML SOLN
80.0000 mL | Freq: Once | INTRAVENOUS | Status: AC | PRN
  Administered 2013-10-03: 80 mL via INTRAVENOUS

## 2013-10-03 NOTE — ED Provider Notes (Signed)
CSN: [id number]     Arrival date & time 10/03/13  1601 History   First MD Initiated Contact with Patient 10/03/13 [phone]     Chief Complaint  Patient presents with  . Chest Pain   (Consider location/radiation/quality/duration/timing/severity/associated sxs/prior Treatment) HPI  58 year old female with lower sternal/epigastric pain. Onset shortly before arrival. Pain is constant and sharp. Some radiation straight through to her back. No appreciable exacerbating factors. She states that when the pain first had her to her breath away, but she specifically denies any shortness of breath currently. No fevers or chills. No cough. No unusual leg pain or swelling. No nausea. No diaphoresis. Was in her usual state of health prior to onset of the symptoms.  Past Medical History  Diagnosis Date  . Papilloma of breast 2012    left. lumpectomy  . Anxiety   . Complication of anesthesia     hard time waking up 20 yrs. ago when having wisdon teeth removed  . Hypertension   . Shortness of breath     WITH EXERTION  . 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  . Lung mass     LUNG MASSES--COUGH-BIOPSY NON-DIAGNOSTIC.  HX OF MEDIASTINAL MASS AND LUNG MASSES AGE 44 -NEGATIVE BIOSPY-BUT GIVEN DIAGNOSIS OF SARCOIDOSIS.  WORK UP CONTINUING ON DIAGNOSIS FOR PT'S LUNG MASSES--SHE HAS BIOPSY PROVEN KIDNEY CANCER  . S/p nephrectomy     left, due to metastatic RCC, 3/14   Past Surgical History  Procedure Laterality Date  . Tonsillectomy  1967  . Bronchial brush biopsy    . Wids    . Wisdom tooth extraction    . Neck lesion biopsy    . Breast biopsy  08/18/2011    Procedure: BREAST BIOPSY WITH NEEDLE LOCALIZATION;  Surgeon: Merrie Roof, MD;  Location: [facility];  Service: General;  Laterality: Left;  left breast  biopsy with needle localization  . Video bronchoscopy  10/11/2012    Procedure: VIDEO BRONCHOSCOPY WITH FLUORO;  Surgeon: Kathee Delton, MD;  Location: [facility];  Service: Cardiopulmonary;  Laterality: Bilateral;  . Laparoscopic nephrectomy Left 11/22/2012    Procedure: LAPAROSCOPIC NEPHRECTOMY;  Surgeon: Dutch Gray, MD;  Location: [facility];  Service: Urology;  Laterality: Left;   Family History  Problem Relation Age of Onset  . Heart disease Mother   . Heart disease Father     heart attack  . Heart disease Brother   . Stroke Brother   . Cancer Maternal Aunt     colon, breast in different aunts/uncles  . Cancer Maternal Uncle     skin, stomach  . Heart disease Maternal Grandmother   . Cancer Maternal Grandfather     cancer   History  Substance Use Topics  . Smoking status: Former Smoker -- 1.50 packs/day for 40 years    Types: Cigarettes    Quit date: 09/18/2012  . Smokeless tobacco: Never Used     Comment: PT STATES THAT SHE QUIT SMOKING 09/27/11. PATIENT STATES THAT SHE HAS NOT "INHALED" IN LAST 4 YEARS.   Marland Kitchen Alcohol Use: Yes     Comment: Pt reports "it's rare"   OB History   Grav Para Term Preterm Abortions TAB SAB Ect Mult Living  Review of Systems  All systems reviewed and negative, other than as noted in HPI.   Allergies  Review of patient's allergies indicates no known allergies.  Home Medications   Current Outpatient Rx  Name  Route  Sig  Dispense  Refill  . amLODipine (NORVASC) 5 MG tablet   Oral   Take 1 tablet (5 mg total) by mouth daily.   90 tablet   3   . benazepril (LOTENSIN) 10 MG tablet   Oral   Take 1 tablet (10 mg total) by mouth daily.   90 tablet   3   . everolimus (AFINITOR) 10 MG tablet   Oral   Take 10 mg by mouth daily.         . hydrochlorothiazide (HYDRODIURIL) 25 MG tablet      TAKE 1 TABLET (25 MG TOTAL) BY MOUTH DAILY.   30 tablet   11   . oxyCODONE-acetaminophen (PERCOCET/ROXICET) 5-325 MG per tablet    Oral   Take 1-2 tablets by mouth every 4 (four) hours as needed for moderate pain.         . predniSONE (DELTASONE) 20 MG tablet   Oral   Take 20 mg by mouth See admin instructions. Take 3 daily for 2 days, then 2 daily for 2 days, then 1 daily for 2 days.          BP 138/72  Pulse 94  Temp(Src) 98 F (36.7 C) (Oral)  Resp 18  SpO2 98% Physical Exam  Nursing note and vitals reviewed. Constitutional: She appears well-developed and well-nourished. No distress.  HENT:  Head: Normocephalic and atraumatic.  Eyes: Conjunctivae are normal. Right eye exhibits no discharge. Left eye exhibits no discharge.  Neck: Neck supple.  Cardiovascular: Normal rate, regular rhythm and normal heart sounds.  Exam reveals no gallop and no friction rub.   No murmur heard. Pulmonary/Chest: Effort normal and breath sounds normal. No respiratory distress.  Abdominal: Soft. She exhibits no distension. There is no tenderness.  Musculoskeletal: She exhibits no edema and no tenderness.  Lower extremities symmetric as compared to each other. No calf tenderness. Negative Homan's. No palpable cords.   Neurological: She is alert.  Skin: Skin is warm and dry.  Psychiatric: She has a normal mood and affect. Her behavior is normal. Thought content normal.    ED Course  Procedures (including critical care time) Labs Review Labs Reviewed - No data to display Imaging Review No results found.  Dg Chest 2 View  10/03/2013   CLINICAL DATA:  Slamming pain within the mid sternal area  EXAM: CHEST  2 VIEW  COMPARISON:  DG CHEST 1V PORT dated 10/11/2012; CT CHEST W/CM dated 09/28/2012; NM PET IMAGE INITIAL (PI) SKULL BASE TO THIGH dated 10/05/2012; DG CHEST 2 VIEW dated 09/25/2012  FINDINGS: Grossly unchanged cardiac silhouette and mediastinal contours with atherosclerotic plaque within a mildly tortuous thoracic aorta. Previously identified bilateral pulmonary nodules/masses have largely resolved in interval with minimal  ill-defined nodular opacities seen within the left upper and lower lung. Minimal left basilar linear heterogeneous opacities favored to represent subsegmental atelectasis. No new focal airspace opacities. There is persistent mild elevation right hemidiaphragm. No pleural effusion or pneumothorax. No evidence of edema. A surgical clip again overlies the superior aspect of the right hilum. Grossly unchanged bones.  IMPRESSION: 1. Decreased conspicuity of previously identified bilateral nodules/masses suggestive of treatment response. 2. Minimal left basilar atelectasis/scar without definite acute cardiopulmonary disease.   Electronically Signed   By:  Sandi Mariscal M.D.   On: 10/03/2013 18:20   Ct Angio Chest W/cm &/or Wo Cm  10/03/2013   CLINICAL DATA:  Substernal chest pain and shortness of breath  EXAM: CT ANGIOGRAPHY CHEST WITH CONTRAST  TECHNIQUE: Multidetector CT imaging of the chest was performed using the standard protocol during bolus administration of intravenous contrast. Multiplanar CT image reconstructions including MIPs were obtained to evaluate the vascular anatomy.  CONTRAST:  56mL OMNIPAQUE IOHEXOL 350 MG/ML SOLN  COMPARISON:  09/28/2012  FINDINGS: The lungs are well aerated bilaterally. The previously seen multiple lung masses have resolved in the interval from the prior exam. There is a minimal residual nodularity identified in the left lower lobe measuring 14 mm in dimension. This is significantly reduced from the previous exam at which time it measured 29 mm in dimension. Very mild peribronchial thickening is noted in the right lower lobe (images 68 through 72) which may be some mild residual from the previously seen right lower lobe mass lesion. No significant hilar or mediastinal adenopathy is noted. Coronary calcifications are seen. The thoracic aorta and pulmonary artery are well visualized and within normal limits. No pulmonary emboli are seen.  Scanning into the upper abdomen shows diffuse  fatty infiltration of the liver. No acute abnormality is seen. The osseous structures  Review of the MIP images confirms the above findings.  IMPRESSION: Significant regression and of multiple pulmonary mass lesions when compare with the prior exam. Residual nodularity is noted in the left lower lobe as well as some peribronchial thickening in the right lower lobe. No new focal mass lesion is seen.  No evidence of pulmonary embolism.  Fatty liver.   Electronically Signed   By: Inez Catalina M.D.   On: 10/03/2013 21:06   EKG Interpretation    Date/Time:  Thursday October 03 2013 16:15:16 EST Ventricular Rate:  94 PR Interval:  180 QRS Duration: 91 QT Interval:  356 QTC Calculation: 445 R Axis:   16 Text Interpretation:  Sinus rhythm Anterior infarct, old ED PHYSICIAN INTERPRETATION AVAILABLE IN [facility] Confirmed by TEST, RECORD ([id number]) on 10/05/2013 2:47:45 PM            MDM   1. Epigastric pain    57yF with epigastric pain/lower sternal pain. Suspect GI etiology. CT w/o evidence of PE or other acute pathology. EKG w/o acute changes. Trop normal. Atypical for ACS. Tender in abdomen. Low suspicion for emergent abdominal process. Pain much improved. Return precautions discussed.     Virgel Manifold, MD 10/08/13 [phone]

## 2013-10-03 NOTE — ED Notes (Signed)
Pt transported to xray 

## 2013-10-03 NOTE — ED Notes (Signed)
Per EMS, pt coming from PCP. Was sent here because pt had sudden onset chest pain in waiting room while at pcp. Pt reported 10/10 pain and reports pain is sharp and radiates to her back. PCP reports pt at high risk for blood clot.

## 2013-10-03 NOTE — ED Notes (Signed)
Pt ambulatory to restroom with no issues

## 2013-10-03 NOTE — Progress Notes (Signed)
Subjective:    Patient ID: Sheri Hernandez, female    DOB: March 27, 1956, 58 y.o.   MRN: [id number]  HPI Patient is a 58 y/o wf with history of metastatic renal cancer who was in our clinic for labwork when she developed acute substernal chest pain and sob.  She is currently screaming on the table and writhing in pain.  EKG shows normal sinus rhythm with no evidence of ischemia or infarction.  She describes the pain as 10/10 and burning in her chest.  She reports pleurisy. Past Medical History  Diagnosis Date  . Papilloma of breast 2012    left. lumpectomy  . Anxiety   . Complication of anesthesia     hard time waking up 20 yrs. ago when having wisdon teeth removed  . Hypertension   . Shortness of breath     WITH EXERTION  . 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  . Lung mass     LUNG MASSES--COUGH-BIOPSY NON-DIAGNOSTIC.  HX OF MEDIASTINAL MASS AND LUNG MASSES AGE 74 -NEGATIVE BIOSPY-BUT GIVEN DIAGNOSIS OF SARCOIDOSIS.  WORK UP CONTINUING ON DIAGNOSIS FOR PT'S LUNG MASSES--SHE HAS BIOPSY PROVEN KIDNEY CANCER  . S/p nephrectomy     left, due to metastatic RCC, 3/14   Past Surgical History  Procedure Laterality Date  . Tonsillectomy  1967  . Bronchial brush biopsy    . Wids    . Wisdom tooth extraction    . Neck lesion biopsy    . Breast biopsy  08/18/2011    Procedure: BREAST BIOPSY WITH NEEDLE LOCALIZATION;  Surgeon: Merrie Roof, MD;  Location: [facility];  Service: General;  Laterality: Left;  left breast biopsy with needle localization  . Video bronchoscopy  10/11/2012    Procedure: VIDEO BRONCHOSCOPY WITH FLUORO;  Surgeon: Kathee Delton, MD;  Location: [facility];  Service: Cardiopulmonary;  Laterality: Bilateral;  . Laparoscopic nephrectomy Left 11/22/2012    Procedure:  LAPAROSCOPIC NEPHRECTOMY;  Surgeon: Dutch Gray, MD;  Location: [facility];  Service: Urology;  Laterality: Left;   Current Outpatient Prescriptions on File Prior to Visit  Medication Sig Dispense Refill  . amLODipine (NORVASC) 5 MG tablet Take 1 tablet (5 mg total) by mouth daily.  90 tablet  3  . benazepril (LOTENSIN) 10 MG tablet Take 1 tablet (10 mg total) by mouth daily.  90 tablet  3  . cholecalciferol (VITAMIN D) 1000 UNITS tablet Take 1,000 Units by mouth daily.      . cyclobenzaprine (FLEXERIL) 10 MG tablet Take 1 tablet (10 mg total) by mouth 3 (three) times daily as needed for muscle spasms.  30 tablet  0  . everolimus (AFINITOR) 10 MG tablet Take 10 mg by mouth daily.      . hydrochlorothiazide (HYDRODIURIL) 25 MG tablet TAKE 1 TABLET (25 MG TOTAL) BY MOUTH DAILY.  30 tablet  11  . meloxicam (MOBIC) 15 MG tablet Take 1 tablet (15 mg total) by mouth daily.  30 tablet  0  . oxyCODONE-acetaminophen (PERCOCET/ROXICET) 5-325 MG per tablet Take 1-2 tablets by mouth every 4 (four) hours as needed.  30 tablet  0  . predniSONE (DELTASONE) 20 MG tablet Take 3 daily for 2 days, then 2 daily for 2 days, then  1 daily for 2 days.  12 tablet  0   No current facility-administered medications on file prior to visit.   No Known Allergies History   Social History  . Marital Status: Married    Spouse Name: N/A    Number of Children: 0  . Years of Education: N/A   Occupational History  . 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    Social History Main Topics  . Smoking status: Former Smoker -- 1.50 packs/day for 40 years    Types: Cigarettes    Quit date: 09/18/2012  . Smokeless tobacco: Never Used     Comment: PT STATES THAT SHE QUIT SMOKING 09/27/11. PATIENT STATES THAT SHE HAS NOT "INHALED" IN LAST 4 YEARS.   Marland Kitchen Alcohol Use: Yes     Comment: OCC  . Drug Use: No  . Sexual Activity: Not on file   Other Topics Concern  . Not on file   Social History Narrative  . No narrative on file       Review of Systems  All other systems reviewed and are negative.       Objective:   Physical Exam  Vitals reviewed. Constitutional: Vital signs are normal. She appears distressed.  Cardiovascular: Normal rate, regular rhythm and normal heart sounds.   No murmur heard. Pulmonary/Chest: Effort normal and breath sounds normal. No respiratory distress. She has no wheezes. She has no rales.  Abdominal: Soft. Bowel sounds are normal.  Musculoskeletal: She exhibits no edema.          Assessment & Plan:  1. Chest pain, unspecified  I am concerned about PE, recommend urgent ER eval by ambulance.  911 called.

## 2013-10-03 NOTE — Discharge Instructions (Signed)
Abdominal Pain, Women °Abdominal (stomach, pelvic, or belly) pain can be caused by many things. It is important to tell your doctor: °· The location of the pain. °· Does it come and go or is it present all the time? °· Are there things that start the pain (eating certain foods, exercise)? °· Are there other symptoms associated with the pain (fever, nausea, vomiting, diarrhea)? °All of this is helpful to know when trying to find the cause of the pain. °CAUSES  °· Stomach: virus or bacteria infection, or ulcer. °· Intestine: appendicitis (inflamed appendix), regional ileitis (Crohn's disease), ulcerative colitis (inflamed colon), irritable bowel syndrome, diverticulitis (inflamed diverticulum of the colon), or cancer of the stomach or intestine. °· Gallbladder disease or stones in the gallbladder. °· Kidney disease, kidney stones, or infection. °· Pancreas infection or cancer. °· Fibromyalgia (pain disorder). °· Diseases of the female organs: °· Uterus: fibroid (non-cancerous) tumors or infection. °· Fallopian tubes: infection or tubal pregnancy. °· Ovary: cysts or tumors. °· Pelvic adhesions (scar tissue). °· Endometriosis (uterus lining tissue growing in the pelvis and on the pelvic organs). °· Pelvic congestion syndrome (female organs filling up with blood just before the menstrual period). °· Pain with the menstrual period. °· Pain with ovulation (producing an egg). °· Pain with an IUD (intrauterine device, birth control) in the uterus. °· Cancer of the female organs. °· Functional pain (pain not caused by a disease, may improve without treatment). °· Psychological pain. °· Depression. °DIAGNOSIS  °Your doctor will decide the seriousness of your pain by doing an examination. °· Blood tests. °· X-rays. °· Ultrasound. °· CT scan (computed tomography, special type of X-ray). °· MRI (magnetic resonance imaging). °· Cultures, for infection. °· Barium enema (dye inserted in the large intestine, to better view it with  X-rays). °· Colonoscopy (looking in intestine with a lighted tube). °· Laparoscopy (minor surgery, looking in abdomen with a lighted tube). °· Major abdominal exploratory surgery (looking in abdomen with a large incision). °TREATMENT  °The treatment will depend on the cause of the pain.  °· Many cases can be observed and treated at home. °· Over-the-counter medicines recommended by your caregiver. °· Prescription medicine. °· Antibiotics, for infection. °· Birth control pills, for painful periods or for ovulation pain. °· Hormone treatment, for endometriosis. °· Nerve blocking injections. °· Physical therapy. °· Antidepressants. °· Counseling with a psychologist or psychiatrist. °· Minor or major surgery. °HOME CARE INSTRUCTIONS  °· Do not take laxatives, unless directed by your caregiver. °· Take over-the-counter pain medicine only if ordered by your caregiver. Do not take aspirin because it can cause an upset stomach or bleeding. °· Try a clear liquid diet (broth or water) as ordered by your caregiver. Slowly move to a bland diet, as tolerated, if the pain is related to the stomach or intestine. °· Have a thermometer and take your temperature several times a day, and record it. °· Bed rest and sleep, if it helps the pain. °· Avoid sexual intercourse, if it causes pain. °· Avoid stressful situations. °· Keep your follow-up appointments and tests, as your caregiver orders. °· If the pain does not go away with medicine or surgery, you may try: °· Acupuncture. °· Relaxation exercises (yoga, meditation). °· Group therapy. °· Counseling. °SEEK MEDICAL CARE IF:  °· You notice certain foods cause stomach pain. °· Your home care treatment is not helping your pain. °· You need stronger pain medicine. °· You want your IUD removed. °· You feel faint or   lightheaded. °· You develop nausea and vomiting. °· You develop a rash. °· You are having side effects or an allergy to your medicine. °SEEK IMMEDIATE MEDICAL CARE IF:  °· Your  pain does not go away or gets worse. °· You have a fever. °· Your pain is felt only in portions of the abdomen. The right side could possibly be appendicitis. The left lower portion of the abdomen could be colitis or diverticulitis. °· You are passing blood in your stools (bright red or black tarry stools, with or without vomiting). °· You have blood in your urine. °· You develop chills, with or without a fever. °· You pass out. °MAKE SURE YOU:  °· Understand these instructions. °· Will watch your condition. °· Will get help right away if you are not doing well or get worse. °Document Released: 06/19/2007 Document Revised: 11/14/2011 Document Reviewed: 07/09/2009 °[employer]® Patient Information ©2014 [employer]. ° °

## 2013-10-07 ENCOUNTER — Encounter: Payer: Self-pay | Admitting: Obstetrics & Gynecology

## 2013-10-07 ENCOUNTER — Ambulatory Visit (INDEPENDENT_AMBULATORY_CARE_PROVIDER_SITE_OTHER): Payer: 59 | Admitting: Obstetrics & Gynecology

## 2013-10-07 VITALS — Ht 68.5 in | Wt 182.4 lb

## 2013-10-07 DIAGNOSIS — Z0000 Encounter for general adult medical examination without abnormal findings: Secondary | ICD-10-CM

## 2013-10-07 NOTE — Progress Notes (Signed)
Subjective:    Sheri Hernandez is a 58 y.o. female who presents for an annual exam. The patient has no complaints today. The patient is sexually active. GYN screening history: last pap: was normal. The patient wears seatbelts: yes. The patient participates in regular exercise: no. Has the patient ever been transfused or tattooed?: no. The patient reports that there is not domestic violence in her life.   Menstrual History: OB History   Grav Para Term Preterm Abortions TAB SAB Ect Mult Living                  Menarche age: 16  No LMP recorded. Patient is postmenopausal.    The following portions of the patient's history were reviewed and updated as appropriate: allergies, current medications, past family history, past medical history, past social history, past surgical history and problem list.  Review of Systems A comprehensive review of systems was negative.    Objective:    Ht 5' 8.5" (1.74 m)  Wt 182 lb 6.4 oz (82.736 kg)  BMI 27.33 kg/m2  General Appearance:    Alert, cooperative, no distress, appears stated age  Head:    Normocephalic, without obvious abnormality, atraumatic  Eyes:    PERRL, conjunctiva/corneas clear, EOM's intact, fundi    benign, both eyes  Ears:    Normal TM's and external ear canals, both ears  Nose:   Nares normal, septum midline, mucosa normal, no drainage    or sinus tenderness  Throat:   Lips, mucosa, and tongue normal; teeth and gums normal  Neck:   Supple, symmetrical, trachea midline, no adenopathy;    thyroid:  no enlargement/tenderness/nodules; no carotid   bruit or JVD  Back:     Symmetric, no curvature, ROM normal, no CVA tenderness  Lungs:     Clear to auscultation bilaterally, respirations unlabored  Chest Wall:    No tenderness or deformity   Heart:    Regular rate and rhythm, S1 and S2 normal, no murmur, rub   or gallop  Breast Exam:    No tenderness, masses, or nipple abnormality  Abdomen:     Soft, non-tender, bowel sounds active all  four quadrants,    no masses, no organomegaly  Genitalia:    Normal female without lesion, discharge or tenderness, NSSA, NT, normal adnexal exam     Extremities:   Extremities normal, atraumatic, no cyanosis or edema  Pulses:   2+ and symmetric all extremities  Skin:   Skin color, texture, turgor normal, no rashes or lesions  Lymph nodes:   Cervical, supraclavicular, and axillary nodes normal  Neurologic:   CNII-XII intact, normal strength, sensation and reflexes    throughout  .    Assessment:    Healthy female exam.    Plan:     Breast self exam technique reviewed and patient encouraged to perform self-exam monthly. Mammogram. Thin prep Pap smear. with cotesting

## 2013-10-09 ENCOUNTER — Ambulatory Visit (HOSPITAL_COMMUNITY)
Admission: RE | Admit: 2013-10-09 | Discharge: 2013-10-09 | Disposition: A | Discharge status: Home / Self Care | Payer: 59 | Source: Ambulatory Visit | Attending: Obstetrics & Gynecology | Admitting: Obstetrics & Gynecology

## 2013-10-09 DIAGNOSIS — Z0000 Encounter for general adult medical examination without abnormal findings: Secondary | ICD-10-CM

## 2013-10-09 DIAGNOSIS — Z1231 Encounter for screening mammogram for malignant neoplasm of breast: Secondary | ICD-10-CM | POA: Insufficient documentation

## 2013-10-15 ENCOUNTER — Other Ambulatory Visit: Payer: Self-pay | Admitting: Obstetrics & Gynecology

## 2013-10-15 DIAGNOSIS — R928 Other abnormal and inconclusive findings on diagnostic imaging of breast: Secondary | ICD-10-CM

## 2013-10-18 ENCOUNTER — Other Ambulatory Visit: Payer: Self-pay | Admitting: Obstetrics & Gynecology

## 2013-10-18 ENCOUNTER — Ambulatory Visit
Admission: RE | Admit: 2013-10-18 | Discharge: 2013-10-18 | Disposition: A | Discharge status: Home / Self Care | Payer: 59 | Source: Ambulatory Visit | Attending: Obstetrics & Gynecology | Admitting: Obstetrics & Gynecology

## 2013-10-18 DIAGNOSIS — R928 Other abnormal and inconclusive findings on diagnostic imaging of breast: Secondary | ICD-10-CM

## 2013-10-21 ENCOUNTER — Encounter: Payer: 59 | Admitting: Gynecology

## 2013-10-22 ENCOUNTER — Telehealth: Payer: Self-pay | Admitting: *Deleted

## 2013-10-22 NOTE — Telephone Encounter (Addendum)
Called [facility] and left a message we are calling with some information - please call clinic during office hours and ask to speak with a nurse.    1152 - received call back from pt's husband stating that she is too worried and upset to receive test result information and would we call him back with the information. He gave tel # [phone].  I called pt back at tel # [phone] and discussed the need for repeat Pap test because no specimen was received by the lab. I also stated that there would be no charge for the visit, just a charge for the Pap test. I further explained that we cannot give her medical information to another person without her written consent. She may sign a Release of Information when she returns to the clinic if she would like. During our conversation she was crying and stated that she was diagnosed with stage 4 kidney cancer a year ago, then her mother died and she has been through a lot recently. I acknowledged that this has been a very emotional time for her and stated that she may call back next week to schedule the appt for Pap smear. Pt agreed and voiced understanding. I asked if she would like me to explain this information to her husband and she said yes.  He was standing close by and she handed the phone to him. I explained all information that I had just given to [facility] and he voiced understanding.  Diane Day RNC

## 2013-10-22 NOTE — Telephone Encounter (Addendum)
Per chart review no pap smear results available, no specimen received by cytology- will need to recollect and send pap with cotesting. Need to call patient and notify- no charge for visit, but need to charge for pap and schedule.

## 2013-10-24 ENCOUNTER — Encounter: Payer: Self-pay | Admitting: Family Medicine

## 2013-10-30 ENCOUNTER — Ambulatory Visit: Payer: 59 | Admitting: Obstetrics & Gynecology

## 2013-11-04 ENCOUNTER — Other Ambulatory Visit: Payer: 59

## 2013-11-04 DIAGNOSIS — C649 Malignant neoplasm of unspecified kidney, except renal pelvis: Secondary | ICD-10-CM

## 2013-11-04 DIAGNOSIS — Z79899 Other long term (current) drug therapy: Secondary | ICD-10-CM

## 2013-11-05 LAB — COMPLETE METABOLIC PANEL WITH GFR
ALT: 44 U/L — ABNORMAL HIGH (ref 0–35)
AST: 35 U/L (ref 0–37)
Albumin: 4 g/dL (ref 3.5–5.2)
Alkaline Phosphatase: 70 U/L (ref 39–117)
BUN: 17 mg/dL (ref 6–23)
CO2: 30 meq/L (ref 19–32)
CREATININE: 0.86 mg/dL (ref 0.50–1.10)
Calcium: 9.7 mg/dL (ref 8.4–10.5)
Chloride: 99 mEq/L (ref 96–112)
GFR, EST AFRICAN AMERICAN: 88 mL/min
GFR, Est Non African American: 76 mL/min
GLUCOSE: 241 mg/dL — AB (ref 70–99)
Potassium: 3.3 mEq/L — ABNORMAL LOW (ref 3.5–5.3)
SODIUM: 140 meq/L (ref 135–145)
TOTAL PROTEIN: 6.7 g/dL (ref 6.0–8.3)
Total Bilirubin: 0.4 mg/dL (ref 0.2–1.2)

## 2013-11-05 LAB — CBC WITH DIFFERENTIAL/PLATELET
BASOS ABS: 0.1 10*3/uL (ref 0.0–0.1)
BASOS PCT: 1 % (ref 0–1)
Eosinophils Absolute: 0.3 10*3/uL (ref 0.0–0.7)
Eosinophils Relative: 3 % (ref 0–5)
HCT: 33.8 % — ABNORMAL LOW (ref 36.0–46.0)
Hemoglobin: 11.6 g/dL — ABNORMAL LOW (ref 12.0–15.0)
LYMPHS PCT: 19 % (ref 12–46)
Lymphs Abs: 1.7 10*3/uL (ref 0.7–4.0)
MCH: 28.6 pg (ref 26.0–34.0)
MCHC: 34.3 g/dL (ref 30.0–36.0)
MCV: 83.5 fL (ref 78.0–100.0)
Monocytes Absolute: 1 10*3/uL (ref 0.1–1.0)
Monocytes Relative: 12 % (ref 3–12)
NEUTROS PCT: 65 % (ref 43–77)
Neutro Abs: 5.7 10*3/uL (ref 1.7–7.7)
Platelets: 289 10*3/uL (ref 150–400)
RBC: 4.05 MIL/uL (ref 3.87–5.11)
RDW: 15.9 % — AB (ref 11.5–15.5)
WBC: 8.7 10*3/uL (ref 4.0–10.5)

## 2013-12-02 ENCOUNTER — Other Ambulatory Visit: Payer: 59

## 2013-12-02 DIAGNOSIS — Z79899 Other long term (current) drug therapy: Secondary | ICD-10-CM

## 2013-12-02 DIAGNOSIS — C649 Malignant neoplasm of unspecified kidney, except renal pelvis: Secondary | ICD-10-CM

## 2013-12-02 LAB — CBC WITH DIFFERENTIAL/PLATELET
BASOS ABS: 0.1 10*3/uL (ref 0.0–0.1)
BASOS PCT: 1 % (ref 0–1)
EOS PCT: 4 % (ref 0–5)
Eosinophils Absolute: 0.4 10*3/uL (ref 0.0–0.7)
HCT: 40.9 % (ref 36.0–46.0)
Hemoglobin: 13.6 g/dL (ref 12.0–15.0)
Lymphocytes Relative: 23 % (ref 12–46)
Lymphs Abs: 2 10*3/uL (ref 0.7–4.0)
MCH: 27.8 pg (ref 26.0–34.0)
MCHC: 33.3 g/dL (ref 30.0–36.0)
MCV: 83.6 fL (ref 78.0–100.0)
Monocytes Absolute: 0.5 10*3/uL (ref 0.1–1.0)
Monocytes Relative: 6 % (ref 3–12)
Neutro Abs: 5.8 10*3/uL (ref 1.7–7.7)
Neutrophils Relative %: 66 % (ref 43–77)
Platelets: 199 10*3/uL (ref 150–400)
RBC: 4.89 MIL/uL (ref 3.87–5.11)
RDW: 20.2 % — AB (ref 11.5–15.5)
WBC: 8.8 10*3/uL (ref 4.0–10.5)

## 2013-12-02 LAB — COMPLETE METABOLIC PANEL WITH GFR
ALBUMIN: 3.6 g/dL (ref 3.5–5.2)
ALK PHOS: 92 U/L (ref 39–117)
ALT: 63 U/L — ABNORMAL HIGH (ref 0–35)
AST: 56 U/L — AB (ref 0–37)
BUN: 18 mg/dL (ref 6–23)
CALCIUM: 9.2 mg/dL (ref 8.4–10.5)
CHLORIDE: 100 meq/L (ref 96–112)
CO2: 28 mEq/L (ref 19–32)
Creat: 0.94 mg/dL (ref 0.50–1.10)
GFR, Est African American: 79 mL/min
GFR, Est Non African American: 69 mL/min
Glucose, Bld: 97 mg/dL (ref 70–99)
POTASSIUM: 3.8 meq/L (ref 3.5–5.3)
SODIUM: 140 meq/L (ref 135–145)
TOTAL PROTEIN: 6.6 g/dL (ref 6.0–8.3)
Total Bilirubin: 1 mg/dL (ref 0.2–1.2)

## 2014-01-03 ENCOUNTER — Other Ambulatory Visit: Payer: 59

## 2014-01-03 DIAGNOSIS — C649 Malignant neoplasm of unspecified kidney, except renal pelvis: Secondary | ICD-10-CM

## 2014-01-03 DIAGNOSIS — Z79899 Other long term (current) drug therapy: Secondary | ICD-10-CM

## 2014-01-04 LAB — COMPLETE METABOLIC PANEL WITH GFR
ALK PHOS: 83 U/L (ref 39–117)
ALT: 53 U/L — ABNORMAL HIGH (ref 0–35)
AST: 51 U/L — ABNORMAL HIGH (ref 0–37)
Albumin: 4 g/dL (ref 3.5–5.2)
BUN: 21 mg/dL (ref 6–23)
CALCIUM: 9.9 mg/dL (ref 8.4–10.5)
CHLORIDE: 98 meq/L (ref 96–112)
CO2: 25 mEq/L (ref 19–32)
Creat: 1.01 mg/dL (ref 0.50–1.10)
GFR, Est African American: 72 mL/min
GFR, Est Non African American: 63 mL/min
Glucose, Bld: 147 mg/dL — ABNORMAL HIGH (ref 70–99)
Potassium: 3.8 mEq/L (ref 3.5–5.3)
Sodium: 137 mEq/L (ref 135–145)
Total Bilirubin: 0.6 mg/dL (ref 0.2–1.2)
Total Protein: 7 g/dL (ref 6.0–8.3)

## 2014-01-04 LAB — CBC WITH DIFFERENTIAL/PLATELET
Basophils Absolute: 0.1 10*3/uL (ref 0.0–0.1)
Basophils Relative: 1 % (ref 0–1)
EOS PCT: 3 % (ref 0–5)
Eosinophils Absolute: 0.3 10*3/uL (ref 0.0–0.7)
HCT: 47 % — ABNORMAL HIGH (ref 36.0–46.0)
Hemoglobin: 16 g/dL — ABNORMAL HIGH (ref 12.0–15.0)
LYMPHS PCT: 33 % (ref 12–46)
Lymphs Abs: 2.9 10*3/uL (ref 0.7–4.0)
MCH: 27.7 pg (ref 26.0–34.0)
MCHC: 34 g/dL (ref 30.0–36.0)
MCV: 81.3 fL (ref 78.0–100.0)
Monocytes Absolute: 0.4 10*3/uL (ref 0.1–1.0)
Monocytes Relative: 5 % (ref 3–12)
Neutro Abs: 5 10*3/uL (ref 1.7–7.7)
Neutrophils Relative %: 58 % (ref 43–77)
PLATELETS: 249 10*3/uL (ref 150–400)
RBC: 5.78 MIL/uL — ABNORMAL HIGH (ref 3.87–5.11)
RDW: 20.1 % — ABNORMAL HIGH (ref 11.5–15.5)
WBC: 8.7 10*3/uL (ref 4.0–10.5)

## 2014-02-04 ENCOUNTER — Ambulatory Visit: Payer: 59 | Admitting: Family Medicine

## 2014-02-05 ENCOUNTER — Ambulatory Visit: Payer: 59 | Admitting: Family Medicine

## 2014-02-05 ENCOUNTER — Other Ambulatory Visit: Payer: 59

## 2014-02-05 DIAGNOSIS — Z79899 Other long term (current) drug therapy: Secondary | ICD-10-CM

## 2014-02-05 DIAGNOSIS — C649 Malignant neoplasm of unspecified kidney, except renal pelvis: Secondary | ICD-10-CM

## 2014-02-06 ENCOUNTER — Other Ambulatory Visit: Payer: Self-pay | Admitting: *Deleted

## 2014-02-06 ENCOUNTER — Telehealth: Payer: Self-pay | Admitting: *Deleted

## 2014-02-06 LAB — COMPLETE METABOLIC PANEL WITH GFR
ALBUMIN: 3.6 g/dL (ref 3.5–5.2)
ALT: 71 U/L — AB (ref 0–35)
AST: 62 U/L — AB (ref 0–37)
Alkaline Phosphatase: 94 U/L (ref 39–117)
BUN: 30 mg/dL — ABNORMAL HIGH (ref 6–23)
CALCIUM: 10.3 mg/dL (ref 8.4–10.5)
CHLORIDE: 98 meq/L (ref 96–112)
CO2: 27 mEq/L (ref 19–32)
Creat: 0.95 mg/dL (ref 0.50–1.10)
GFR, Est African American: 78 mL/min
GFR, Est Non African American: 68 mL/min
Glucose, Bld: 118 mg/dL — ABNORMAL HIGH (ref 70–99)
POTASSIUM: 4.1 meq/L (ref 3.5–5.3)
Sodium: 137 mEq/L (ref 135–145)
TOTAL PROTEIN: 6.9 g/dL (ref 6.0–8.3)
Total Bilirubin: 0.5 mg/dL (ref 0.2–1.2)

## 2014-02-06 LAB — CBC WITH DIFFERENTIAL/PLATELET
BASOS PCT: 1 % (ref 0–1)
Basophils Absolute: 0.1 10*3/uL (ref 0.0–0.1)
EOS ABS: 0.3 10*3/uL (ref 0.0–0.7)
EOS PCT: 3 % (ref 0–5)
HEMATOCRIT: 50.3 % — AB (ref 36.0–46.0)
Hemoglobin: 17.3 g/dL — ABNORMAL HIGH (ref 12.0–15.0)
Lymphocytes Relative: 32 % (ref 12–46)
Lymphs Abs: 2.7 10*3/uL (ref 0.7–4.0)
MCH: 28.5 pg (ref 26.0–34.0)
MCHC: 34.4 g/dL (ref 30.0–36.0)
MCV: 82.7 fL (ref 78.0–100.0)
MONO ABS: 0.8 10*3/uL (ref 0.1–1.0)
Monocytes Relative: 9 % (ref 3–12)
Neutro Abs: 4.7 10*3/uL (ref 1.7–7.7)
Neutrophils Relative %: 55 % (ref 43–77)
Platelets: 170 10*3/uL (ref 150–400)
RBC: 6.08 MIL/uL — ABNORMAL HIGH (ref 3.87–5.11)
RDW: 19.3 % — ABNORMAL HIGH (ref 11.5–15.5)
WBC: 8.5 10*3/uL (ref 4.0–10.5)

## 2014-02-06 LAB — TSH: TSH: 110.854 u[IU]/mL — AB (ref 0.350–4.500)

## 2014-02-06 MED ORDER — LEVOTHYROXINE SODIUM 75 MCG PO TABS: 75.0000 ug | ORAL_TABLET | Freq: Every day | ORAL | Status: DC

## 2014-02-06 NOTE — Telephone Encounter (Signed)
Received call from patient husband, Chrissie Noa.   Reports that patient has increased confusion and requested to have writer discuss labs with him.   Advised that patient TSH is extremely elevated at 110, and this can lead to increased fatigue, confusion, irritability, hair loss, and possibly goiter.  Advised that MD would like to discuss labs with patient and would like to start medication for hypothyroidism.   Patient spouse spoke with patient and appointment scheduled for 02/07/2014 at 4:30 pm.   Prescription sent to pharmacy.   MD to be made aware.

## 2014-02-07 ENCOUNTER — Ambulatory Visit (INDEPENDENT_AMBULATORY_CARE_PROVIDER_SITE_OTHER): Payer: 59 | Admitting: Family Medicine

## 2014-02-07 ENCOUNTER — Encounter: Payer: Self-pay | Admitting: Family Medicine

## 2014-02-07 VITALS — BP 160/110 | HR 100 | Temp 97.4°F | Resp 18 | Ht 68.0 in | Wt 182.0 lb

## 2014-02-07 DIAGNOSIS — E039 Hypothyroidism, unspecified: Secondary | ICD-10-CM

## 2014-02-07 NOTE — Progress Notes (Signed)
Subjective:    Patient ID: Sheri Hernandez, female    DOB: Aug 20, 1956, 58 y.o.   MRN: [id number]  HPI Patient was recently seen at [facility] where she was complaining about severe fatigue. Dr. Marcello Moores checked a TSH.  TSH was profoundly elevated at 110.  Patient reports she feels extremely sleepy and tired. She is unable to concentrate. She is also reporting increased abdominal bloating and a palpable abdominal mass around her umbilicus. On examination she has a midline ventral hernia around the umbilicus near previous surgical scar. I believe that's what she is feeling. I reviewed her most recent CT scan from May. CT scan in May showed an enlarging ventral hernia a significant amount of omentum which was not incarcerated.  The remainder of the scan revealed regression of her tumor burden. Past Medical History  Diagnosis Date  . Papilloma of breast 2012    left. lumpectomy  . Anxiety   . Complication of anesthesia     hard time waking up 20 yrs. ago when having wisdon teeth removed  . Hypertension   . Shortness of breath     WITH EXERTION  . 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  . Lung mass     LUNG MASSES--COUGH-BIOPSY NON-DIAGNOSTIC.  HX OF MEDIASTINAL MASS AND LUNG MASSES AGE 71 -NEGATIVE BIOSPY-BUT GIVEN DIAGNOSIS OF SARCOIDOSIS.  WORK UP CONTINUING ON DIAGNOSIS FOR PT'S LUNG MASSES--SHE HAS BIOPSY PROVEN KIDNEY CANCER  . S/p nephrectomy     left, due to metastatic RCC, 3/14  . Cancer   . Asthma    Current Outpatient Prescriptions on File Prior to Visit  Medication Sig Dispense Refill  . amLODipine (NORVASC) 5 MG tablet Take 1 tablet (5 mg total) by mouth daily.  90 tablet  3  . hydrochlorothiazide (HYDRODIURIL) 25 MG tablet TAKE 1 TABLET (25 MG TOTAL) BY MOUTH DAILY.  30 tablet  11   . levothyroxine (SYNTHROID, LEVOTHROID) 75 MCG tablet Take 1 tablet (75 mcg total) by mouth daily.  30 tablet  3   No current facility-administered medications on file prior to visit.   Past Surgical History  Procedure Laterality Date  . Tonsillectomy  1967  . Bronchial brush biopsy    . Wids    . Wisdom tooth extraction    . Neck lesion biopsy    . Breast biopsy  08/18/2011    Procedure: BREAST BIOPSY WITH NEEDLE LOCALIZATION;  Surgeon: Merrie Roof, MD;  Location: [facility];  Service: General;  Laterality: Left;  left breast biopsy with needle localization  . Video bronchoscopy  10/11/2012    Procedure: VIDEO BRONCHOSCOPY WITH FLUORO;  Surgeon: Kathee Delton, MD;  Location: [facility];  Service: Cardiopulmonary;  Laterality: Bilateral;  . Laparoscopic nephrectomy Left 11/22/2012    Procedure: LAPAROSCOPIC NEPHRECTOMY;  Surgeon: Dutch Gray, MD;  Location: [facility];  Service: Urology;  Laterality: Left;   No Known Allergies History   Social History  . Marital Status: Married    Spouse Name: N/A    Number of Children: 0  . Years of Education: N/A   Occupational History  . 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    Social History Main Topics  . Smoking status: Former Smoker --  1.50 packs/day for 40 years    Types: Cigarettes    Quit date: 09/18/2012  . Smokeless tobacco: Never Used     Comment: PT STATES THAT SHE QUIT SMOKING 09/27/11. PATIENT STATES THAT SHE HAS NOT "INHALED" IN LAST 4 YEARS.   Marland Kitchen Alcohol Use: Yes     Comment: Pt reports "it's rare"  . Drug Use: No  . Sexual Activity: Not on file   Other Topics Concern  . Not on file   Social History Narrative  . No narrative on file      Review of Systems  All other systems reviewed and are negative.      Objective:   Physical Exam  Cardiovascular: Normal rate, regular rhythm and normal heart sounds.   Pulmonary/Chest: Effort normal and breath sounds normal. No respiratory distress. She has no wheezes. She  has no rales.  Abdominal: Soft. Bowel sounds are normal. She exhibits mass.   midline ventral hernia.        Assessment & Plan:  1. Unspecified hypothyroidism Begin levothyroxine 75 mcg by mouth daily and recheck a TSH in 6 weeks. Increase levothyroxine until TSH is within the therapeutic range.  I anticipate that she'll need between 100 mcg and 125 mcg.  I explained to the patient that they mass she is feeling around the umbilicus is a ventral hernia and tried to reassure her. Her blood pressure is elevated today because she is extremely anxious.  At home, blood pressure has been much better. Recommended that she check her blood pressure daily at home and notify me of the values on Monday and I can titrate her medications if necessary.

## 2014-02-08 ENCOUNTER — Other Ambulatory Visit: Payer: Self-pay | Admitting: Physician Assistant

## 2014-02-10 NOTE — Telephone Encounter (Signed)
Refill appropriate and filled per protocol. 

## 2014-03-12 ENCOUNTER — Other Ambulatory Visit: Payer: 59

## 2014-03-12 DIAGNOSIS — Z79899 Other long term (current) drug therapy: Secondary | ICD-10-CM

## 2014-03-12 DIAGNOSIS — C649 Malignant neoplasm of unspecified kidney, except renal pelvis: Secondary | ICD-10-CM

## 2014-03-12 LAB — CBC WITH DIFFERENTIAL/PLATELET
BASOS ABS: 0.1 10*3/uL (ref 0.0–0.1)
Basophils Relative: 1 % (ref 0–1)
Eosinophils Absolute: 0.2 10*3/uL (ref 0.0–0.7)
Eosinophils Relative: 2 % (ref 0–5)
HCT: 50.2 % — ABNORMAL HIGH (ref 36.0–46.0)
HEMOGLOBIN: 17.8 g/dL — AB (ref 12.0–15.0)
Lymphocytes Relative: 24 % (ref 12–46)
Lymphs Abs: 1.9 10*3/uL (ref 0.7–4.0)
MCH: 28.7 pg (ref 26.0–34.0)
MCHC: 35.5 g/dL (ref 30.0–36.0)
MCV: 81 fL (ref 78.0–100.0)
MONO ABS: 0.6 10*3/uL (ref 0.1–1.0)
Monocytes Relative: 8 % (ref 3–12)
Neutro Abs: 5.1 10*3/uL (ref 1.7–7.7)
Neutrophils Relative %: 65 % (ref 43–77)
Platelets: 194 10*3/uL (ref 150–400)
RBC: 6.2 MIL/uL — ABNORMAL HIGH (ref 3.87–5.11)
RDW: 18.3 % — AB (ref 11.5–15.5)
WBC: 7.8 10*3/uL (ref 4.0–10.5)

## 2014-03-12 LAB — COMPLETE METABOLIC PANEL WITH GFR
ALBUMIN: 4 g/dL (ref 3.5–5.2)
ALK PHOS: 68 U/L (ref 39–117)
ALT: 33 U/L (ref 0–35)
AST: 34 U/L (ref 0–37)
BILIRUBIN TOTAL: 0.8 mg/dL (ref 0.2–1.2)
BUN: 23 mg/dL (ref 6–23)
CO2: 27 mEq/L (ref 19–32)
CREATININE: 0.98 mg/dL (ref 0.50–1.10)
Calcium: 10.1 mg/dL (ref 8.4–10.5)
Chloride: 98 mEq/L (ref 96–112)
GFR, EST NON AFRICAN AMERICAN: 65 mL/min
GFR, Est African American: 75 mL/min
GLUCOSE: 105 mg/dL — AB (ref 70–99)
Potassium: 3.6 mEq/L (ref 3.5–5.3)
Sodium: 139 mEq/L (ref 135–145)
Total Protein: 6.9 g/dL (ref 6.0–8.3)

## 2014-03-13 DIAGNOSIS — E032 Hypothyroidism due to medicaments and other exogenous substances: Secondary | ICD-10-CM | POA: Insufficient documentation

## 2014-03-13 DIAGNOSIS — R42 Dizziness and giddiness: Secondary | ICD-10-CM | POA: Insufficient documentation

## 2014-03-13 DIAGNOSIS — D751 Secondary polycythemia: Secondary | ICD-10-CM | POA: Insufficient documentation

## 2014-03-13 HISTORY — DX: Hypothyroidism due to medicaments and other exogenous substances: E03.2

## 2014-03-14 ENCOUNTER — Telehealth: Payer: Self-pay | Admitting: *Deleted

## 2014-03-14 MED ORDER — AMLODIPINE BESYLATE 10 MG PO TABS: 10.0000 mg | ORAL_TABLET | Freq: Every day | ORAL | Status: DC

## 2014-03-14 NOTE — Telephone Encounter (Signed)
Blood pressure is mildly elevated,  Increase amlodipine to 10 mg poqday.  Recheck in 2 weeks.

## 2014-03-14 NOTE — Telephone Encounter (Signed)
Received call from patient.   Reports that she was seen in [facility] yesterday and her BP was noted elevated.   03/13/2014: 135/83 152/106 @ 3pm after blood draw 137/100 @11pm   03/14/2014:  136/104 @ 11am 137/104 @ 4pm  States that [place] wanted her PCP to adjust medications if needed.   MD please advise.

## 2014-03-14 NOTE — Telephone Encounter (Signed)
Prescription sent to pharmacy.   Call placed to patient to make aware. [place].

## 2014-03-17 NOTE — Telephone Encounter (Signed)
Pt called back and aware of message,stated she has been taking double of 5mg  amlodipine over the weekend and will continue to double until she runs out of meds and then will pick up her new prescription.

## 2014-03-17 NOTE — Telephone Encounter (Signed)
LMTRC

## 2014-03-20 ENCOUNTER — Ambulatory Visit: Payer: 59 | Admitting: Family Medicine

## 2014-03-21 ENCOUNTER — Other Ambulatory Visit: Payer: 59

## 2014-03-21 DIAGNOSIS — E039 Hypothyroidism, unspecified: Secondary | ICD-10-CM

## 2014-03-21 LAB — TSH: TSH: 48.974 u[IU]/mL — ABNORMAL HIGH (ref 0.350–4.500)

## 2014-03-25 ENCOUNTER — Ambulatory Visit (INDEPENDENT_AMBULATORY_CARE_PROVIDER_SITE_OTHER): Payer: 59 | Admitting: Family Medicine

## 2014-03-25 ENCOUNTER — Encounter: Payer: Self-pay | Admitting: Family Medicine

## 2014-03-25 VITALS — BP 110/82 | HR 100 | Temp 97.6°F | Resp 16 | Ht 68.0 in | Wt 178.0 lb

## 2014-03-25 DIAGNOSIS — L0100 Impetigo, unspecified: Secondary | ICD-10-CM

## 2014-03-25 DIAGNOSIS — I10 Essential (primary) hypertension: Secondary | ICD-10-CM

## 2014-03-25 DIAGNOSIS — E039 Hypothyroidism, unspecified: Secondary | ICD-10-CM

## 2014-03-25 MED ORDER — LEVOTHYROXINE SODIUM 112 MCG PO TABS: 112.0000 ug | ORAL_TABLET | Freq: Every day | ORAL | Status: DC

## 2014-03-25 MED ORDER — AMLODIPINE BESYLATE 10 MG PO TABS: 10.0000 mg | ORAL_TABLET | Freq: Every day | ORAL | Status: DC

## 2014-03-25 MED ORDER — MUPIROCIN 2 % EX OINT
1.0000 | TOPICAL_OINTMENT | Freq: Two times a day (BID) | CUTANEOUS | Status: DC
Start: 2014-03-25 — End: 2014-07-25

## 2014-03-25 NOTE — Progress Notes (Signed)
Subjective:    Patient ID: Sheri Hernandez, female    DOB: [birth year], 58 y.o.   MRN: [id number]  HPI Patient is here for follow up of her hypothyroidism. She is currently taking levothyroxine 75 mcg by mouth daily. This was pleasantly discovered while she was being seen at [facility] for metastatic renal cell carcinoma. At that time she was complaining of fatigue. Her TSH was 111.  After taking levothyroxine the last 2 months, her TSH is fallen to 49.   She also called last week stating that her blood pressure was slightly elevated. At that time we recommended that she increase amlodipine to 10 mg by mouth daily. She is here for followup of both of these issues.    She also has a red patch on her inner left gluteal fold with thick yellow scale consistent with impetigo 3 x 5 cm. Past Medical History  Diagnosis Date  . Papilloma of breast 2012    left. lumpectomy  . Anxiety   . Complication of anesthesia     hard time waking up 20 yrs. ago when having wisdon teeth removed  . Hypertension   . Shortness of breath     WITH EXERTION  . 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  . Lung mass     LUNG MASSES--COUGH-BIOPSY NON-DIAGNOSTIC.  HX OF MEDIASTINAL MASS AND LUNG MASSES AGE 38 -NEGATIVE BIOSPY-BUT GIVEN DIAGNOSIS OF SARCOIDOSIS.  WORK UP CONTINUING ON DIAGNOSIS FOR PT'S LUNG MASSES--SHE HAS BIOPSY PROVEN KIDNEY CANCER  . S/p nephrectomy     left, due to metastatic RCC, 3/14  . Cancer   . Asthma    Current Outpatient Prescriptions on File Prior to Visit  Medication Sig Dispense Refill  . axitinib (INLYTA) 1 MG tablet Take 1 mg by mouth 2 (two) times daily. 3 tab po bid      . hydrochlorothiazide (HYDRODIURIL) 25 MG tablet TAKE 1 TABLET (25 MG TOTAL) BY MOUTH DAILY.  30 tablet  11  .  levothyroxine (SYNTHROID, LEVOTHROID) 75 MCG tablet Take 1 tablet (75 mcg total) by mouth daily.  30 tablet  3  . minocycline (MINOCIN,DYNACIN) 100 MG capsule Take 100 mg by mouth daily.      Marland Kitchen HYDROcodone-acetaminophen (NORCO/VICODIN) 5-325 MG per tablet Take 1 tablet by mouth every 4 (four) hours as needed for moderate pain.       No current facility-administered medications on file prior to visit.   No Known Allergies History   Social History  . Marital Status: Married    Spouse Name: N/A    Number of Children: 0  . Years of Education: N/A   Occupational History  . 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    Social History Main Topics  . Smoking status: Former Smoker -- 1.50 packs/day for 40 years    Types: Cigarettes    Quit date: 09/18/2012  . Smokeless tobacco: Never Used     Comment: PT STATES THAT SHE QUIT SMOKING 09/27/11. PATIENT STATES THAT SHE HAS NOT "INHALED" IN LAST 4 YEARS.   Marland Kitchen Alcohol Use: Yes     Comment: Pt reports "it's rare"  . Drug Use: No  . Sexual Activity: Not on file   Other Topics Concern  .  Not on file   Social History Narrative  . No narrative on file     Review of Systems  All other systems reviewed and are negative.      Objective:   Physical Exam  Vitals reviewed. Cardiovascular: Normal rate, regular rhythm and normal heart sounds.   No murmur heard. Pulmonary/Chest: Effort normal and breath sounds normal. No respiratory distress. She has no wheezes. She has no rales.  Abdominal: Soft. Bowel sounds are normal. She exhibits no distension. There is no tenderness. There is no rebound.  Skin: Rash noted. There is erythema.          Assessment & Plan:  1. Unspecified hypothyroidism Increase levothyroxine to 112 mcg by mouth daily and recheck TSH in 6 week - levothyroxine (SYNTHROID, LEVOTHROID) 112 MCG tablet; Take 1 tablet (112 mcg total) by mouth daily.  Dispense: 30 tablet; Refill: 3  2. Impetigo Bactroban twice a day for 2  weeks recheck if no better or sooner if worse. - mupirocin ointment (BACTROBAN) 2 %; Place 1 application into the nose 2 (two) times daily.  Dispense: 22 g; Refill: 0  3. Essential hypertension Blood pressure is much improved. Continue amlodipine 10 mg by mouth daily.

## 2014-04-09 ENCOUNTER — Other Ambulatory Visit: Payer: 59

## 2014-04-09 DIAGNOSIS — C649 Malignant neoplasm of unspecified kidney, except renal pelvis: Secondary | ICD-10-CM

## 2014-04-09 DIAGNOSIS — Z79899 Other long term (current) drug therapy: Secondary | ICD-10-CM

## 2014-04-09 LAB — COMPLETE METABOLIC PANEL WITH GFR
ALK PHOS: 86 U/L (ref 39–117)
ALT: 32 U/L (ref 0–35)
AST: 31 U/L (ref 0–37)
Albumin: 4 g/dL (ref 3.5–5.2)
BILIRUBIN TOTAL: 0.9 mg/dL (ref 0.2–1.2)
BUN: 29 mg/dL — AB (ref 6–23)
CO2: 25 meq/L (ref 19–32)
CREATININE: 0.93 mg/dL (ref 0.50–1.10)
Calcium: 9.8 mg/dL (ref 8.4–10.5)
Chloride: 97 mEq/L (ref 96–112)
GFR, EST AFRICAN AMERICAN: 79 mL/min
GFR, EST NON AFRICAN AMERICAN: 69 mL/min
Glucose, Bld: 114 mg/dL — ABNORMAL HIGH (ref 70–99)
Potassium: 4 mEq/L (ref 3.5–5.3)
SODIUM: 137 meq/L (ref 135–145)
Total Protein: 7.3 g/dL (ref 6.0–8.3)

## 2014-04-09 LAB — CBC WITH DIFFERENTIAL/PLATELET
BASOS ABS: 0.1 10*3/uL (ref 0.0–0.1)
BASOS PCT: 1 % (ref 0–1)
Eosinophils Absolute: 0.2 10*3/uL (ref 0.0–0.7)
Eosinophils Relative: 2 % (ref 0–5)
HEMATOCRIT: 48.5 % — AB (ref 36.0–46.0)
HEMOGLOBIN: 16.6 g/dL — AB (ref 12.0–15.0)
LYMPHS PCT: 25 % (ref 12–46)
Lymphs Abs: 2.2 10*3/uL (ref 0.7–4.0)
MCH: 28.4 pg (ref 26.0–34.0)
MCHC: 34.2 g/dL (ref 30.0–36.0)
MCV: 82.9 fL (ref 78.0–100.0)
MONO ABS: 0.6 10*3/uL (ref 0.1–1.0)
MONOS PCT: 7 % (ref 3–12)
NEUTROS PCT: 65 % (ref 43–77)
Neutro Abs: 5.6 10*3/uL (ref 1.7–7.7)
Platelets: 266 10*3/uL (ref 150–400)
RBC: 5.85 MIL/uL — ABNORMAL HIGH (ref 3.87–5.11)
RDW: 17.8 % — ABNORMAL HIGH (ref 11.5–15.5)
WBC: 8.6 10*3/uL (ref 4.0–10.5)

## 2014-04-22 ENCOUNTER — Ambulatory Visit (INDEPENDENT_AMBULATORY_CARE_PROVIDER_SITE_OTHER): Payer: 59 | Admitting: Family Medicine

## 2014-04-22 ENCOUNTER — Encounter: Payer: Self-pay | Admitting: Family Medicine

## 2014-04-22 VITALS — BP 128/90 | HR 80 | Temp 97.9°F | Resp 16 | Ht 68.0 in | Wt 175.0 lb

## 2014-04-22 DIAGNOSIS — R197 Diarrhea, unspecified: Secondary | ICD-10-CM

## 2014-04-22 NOTE — Progress Notes (Signed)
Subjective:    Patient ID: Sheri Hernandez, female    DOB: [birth year], 58 y.o.   MRN: [id number]  HPI  Patient complains of copious watery diarrhea for the last 4-6 weeks.  It began in mid July. She denies any bloody diarrhea. She denies any fevers. She denies abdominal pain. She has occasional nausea but no vomiting. She denies any travel outside the country. She is on her fifth month Inlyta chemotherapy for metastatic renal cell carcinoma. She has not had any trouble with diarrhea or GI problems on the chemotherapy prior to now. At approximately the same time the patient's diarrhea began I did increase her levothyroxine to 75 mcg to 112 mcg daily. Her TSH at that time was still subtherapeutic. I have not checked a TSH since. Approximate the same time I did treat the patient for impetigo with minocycline. She is no longer taking that.  She has no known exposure to Clostridium difficile colitis although she is frequently in medical facilities and has a compromised immune system due to chemotherapy. She denies any foods that seem to trigger the diarrhea. She's also noticed a change in the size of the stool caliber. It is now very small and stringy nature. It is also grey chalky white suggesting malabsorption.  She has not tried any probiotics or  antidiarrheal medication. Past Medical History  Diagnosis Date  . Papilloma of breast 2012    left. lumpectomy  . Anxiety   . Complication of anesthesia     hard time waking up 20 yrs. ago when having wisdon teeth removed  . Hypertension   . Shortness of breath     WITH EXERTION  . 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  . Lung mass     LUNG MASSES--COUGH-BIOPSY NON-DIAGNOSTIC.  HX OF MEDIASTINAL MASS AND LUNG MASSES AGE 69 -NEGATIVE BIOSPY-BUT GIVEN  DIAGNOSIS OF SARCOIDOSIS.  WORK UP CONTINUING ON DIAGNOSIS FOR PT'S LUNG MASSES--SHE HAS BIOPSY PROVEN KIDNEY CANCER  . S/p nephrectomy     left, due to metastatic RCC, 3/14  . Cancer   . Asthma    Past Surgical History  Procedure Laterality Date  . Tonsillectomy  1967  . Bronchial brush biopsy    . Wids    . Wisdom tooth extraction    . Neck lesion biopsy    . Breast biopsy  08/18/2011    Procedure: BREAST BIOPSY WITH NEEDLE LOCALIZATION;  Surgeon: Merrie Roof, MD;  Location: [facility];  Service: General;  Laterality: Left;  left breast biopsy with needle localization  . Video bronchoscopy  10/11/2012    Procedure: VIDEO BRONCHOSCOPY WITH FLUORO;  Surgeon: Kathee Delton, MD;  Location: [facility];  Service: Cardiopulmonary;  Laterality: Bilateral;  . Laparoscopic nephrectomy Left 11/22/2012    Procedure: LAPAROSCOPIC NEPHRECTOMY;  Surgeon: Dutch Gray, MD;  Location: [facility];  Service: Urology;  Laterality: Left;   Current Outpatient Prescriptions on File Prior to Visit  Medication Sig Dispense Refill  . amLODipine (NORVASC) 10 MG tablet Take 1 tablet (10 mg total) by mouth daily.  90 tablet  3  . axitinib (INLYTA) 1 MG tablet Take 1 mg by mouth 2 (two) times daily. 3 tab po bid      . hydrochlorothiazide (  HYDRODIURIL) 25 MG tablet TAKE 1 TABLET (25 MG TOTAL) BY MOUTH DAILY.  30 tablet  11  . HYDROcodone-acetaminophen (NORCO/VICODIN) 5-325 MG per tablet Take 1 tablet by mouth every 4 (four) hours as needed for moderate pain.      Marland Kitchen levothyroxine (SYNTHROID, LEVOTHROID) 112 MCG tablet Take 1 tablet (112 mcg total) by mouth daily.  30 tablet  3  . minocycline (MINOCIN,DYNACIN) 100 MG capsule Take 100 mg by mouth daily.      . mupirocin ointment (BACTROBAN) 2 % Place 1 application into the nose 2 (two) times daily.  22 g  0   No current facility-administered medications on file prior to visit.   No Known Allergies History   Social History  . Marital Status: Married    Spouse Name:  N/A    Number of Children: 0  . Years of Education: N/A   Occupational History  . 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    Social History Main Topics  . Smoking status: Former Smoker -- 1.50 packs/day for 40 years    Types: Cigarettes    Quit date: 09/18/2012  . Smokeless tobacco: Never Used     Comment: PT STATES THAT SHE QUIT SMOKING 09/27/11. PATIENT STATES THAT SHE HAS NOT "INHALED" IN LAST 4 YEARS.   Marland Kitchen Alcohol Use: Yes     Comment: Pt reports "it's rare"  . Drug Use: No  . Sexual Activity: Not on file   Other Topics Concern  . Not on file   Social History Narrative  . No narrative on file     Review of Systems  All other systems reviewed and are negative.      Objective:   Physical Exam  Vitals reviewed. Constitutional: She appears well-developed and well-nourished.  Cardiovascular: Normal rate, regular rhythm and normal heart sounds.   Pulmonary/Chest: Effort normal and breath sounds normal. No respiratory distress. She has no wheezes. She has no rales.  Abdominal: Soft. Bowel sounds are normal. She exhibits no distension and no mass. There is no tenderness. There is no rebound and no guarding.          Assessment & Plan:  1. Diarrhea Patient's physical exam today is essentially normal. I will check a TSH to rule out iatrogenic hyperthyroidism.  Given her recent antibiotic use I will also check for C. difficile colitis. Also perform stool studies to evaluate for gastrointestinal pathogens. If his lab work is normal,treat  the patient for possible malabsorptive diarrhea secondary to chemo.. Consider using Imodium and probiotics. Also consider evaluation for pancreatic insufficiency.  Of note, diarrhea is reported in 55% of patients on Inlyta.   - Clostridium Difficile by PCR - Gastrointestinal Pathogen Panel PCR - CBC with Differential - COMPLETE METABOLIC PANEL WITH GFR - TSH

## 2014-04-23 ENCOUNTER — Other Ambulatory Visit: Payer: 59

## 2014-04-23 LAB — COMPLETE METABOLIC PANEL WITH GFR
ALT: 44 U/L — AB (ref 0–35)
AST: 42 U/L — AB (ref 0–37)
Albumin: 4 g/dL (ref 3.5–5.2)
Alkaline Phosphatase: 75 U/L (ref 39–117)
BUN: 29 mg/dL — ABNORMAL HIGH (ref 6–23)
CALCIUM: 10.3 mg/dL (ref 8.4–10.5)
CHLORIDE: 97 meq/L (ref 96–112)
CO2: 27 meq/L (ref 19–32)
Creat: 1.12 mg/dL — ABNORMAL HIGH (ref 0.50–1.10)
GFR, EST AFRICAN AMERICAN: 63 mL/min
GFR, Est Non African American: 55 mL/min — ABNORMAL LOW
Glucose, Bld: 109 mg/dL — ABNORMAL HIGH (ref 70–99)
POTASSIUM: 3.5 meq/L (ref 3.5–5.3)
SODIUM: 137 meq/L (ref 135–145)
TOTAL PROTEIN: 6.9 g/dL (ref 6.0–8.3)
Total Bilirubin: 0.7 mg/dL (ref 0.2–1.2)

## 2014-04-23 LAB — CBC WITH DIFFERENTIAL/PLATELET
BASOS ABS: 0.1 10*3/uL (ref 0.0–0.1)
Basophils Relative: 1 % (ref 0–1)
Eosinophils Absolute: 0.2 10*3/uL (ref 0.0–0.7)
Eosinophils Relative: 2 % (ref 0–5)
HCT: 44.2 % (ref 36.0–46.0)
Hemoglobin: 14.9 g/dL (ref 12.0–15.0)
LYMPHS ABS: 2.2 10*3/uL (ref 0.7–4.0)
Lymphocytes Relative: 28 % (ref 12–46)
MCH: 28.7 pg (ref 26.0–34.0)
MCHC: 33.7 g/dL (ref 30.0–36.0)
MCV: 85.2 fL (ref 78.0–100.0)
Monocytes Absolute: 0.6 10*3/uL (ref 0.1–1.0)
Monocytes Relative: 7 % (ref 3–12)
NEUTROS ABS: 5 10*3/uL (ref 1.7–7.7)
Neutrophils Relative %: 62 % (ref 43–77)
PLATELETS: 257 10*3/uL (ref 150–400)
RBC: 5.19 MIL/uL — AB (ref 3.87–5.11)
RDW: 17.5 % — AB (ref 11.5–15.5)
WBC: 8 10*3/uL (ref 4.0–10.5)

## 2014-04-23 LAB — TSH: TSH: 30.355 u[IU]/mL — ABNORMAL HIGH (ref 0.350–4.500)

## 2014-04-24 ENCOUNTER — Telehealth: Payer: Self-pay | Admitting: Family Medicine

## 2014-04-24 LAB — GASTROINTESTINAL PATHOGEN PANEL PCR
C. DIFFICILE TOX A/B, PCR: NEGATIVE
CRYPTOSPORIDIUM, PCR: NEGATIVE
Campylobacter, PCR: NEGATIVE
E COLI (ETEC) LT/ST, PCR: NEGATIVE
E COLI (STEC) STX1/STX2, PCR: NEGATIVE
E coli 0157, PCR: NEGATIVE
Giardia lamblia, PCR: NEGATIVE
NOROVIRUS, PCR: NEGATIVE
ROTAVIRUS, PCR: NEGATIVE
Salmonella, PCR: NEGATIVE
Shigella, PCR: NEGATIVE

## 2014-04-24 NOTE — Telephone Encounter (Signed)
[phone]  Pt is wanting to know if her test results are back

## 2014-04-25 LAB — CLOSTRIDIUM DIFFICILE BY PCR: CDIFFPCR: NOT DETECTED

## 2014-04-25 NOTE — Telephone Encounter (Signed)
Pt aware.

## 2014-04-29 ENCOUNTER — Encounter: Payer: Self-pay | Admitting: Family Medicine

## 2014-04-29 NOTE — Telephone Encounter (Signed)
Patient is calling for additional test results  [phone]

## 2014-04-30 NOTE — Telephone Encounter (Signed)
This encounter was created in error - please disregard.

## 2014-05-08 ENCOUNTER — Ambulatory Visit (INDEPENDENT_AMBULATORY_CARE_PROVIDER_SITE_OTHER): Payer: 59 | Admitting: Family Medicine

## 2014-05-08 ENCOUNTER — Encounter: Payer: Self-pay | Admitting: Family Medicine

## 2014-05-08 VITALS — BP 130/92 | HR 84 | Temp 98.3°F | Resp 16 | Ht 68.0 in | Wt 180.0 lb

## 2014-05-08 DIAGNOSIS — E039 Hypothyroidism, unspecified: Secondary | ICD-10-CM

## 2014-05-08 NOTE — Progress Notes (Signed)
Subjective:    Patient ID: Sheri Hernandez, female    DOB: [birth year], 58 y.o.   MRN: [id number]  HPI  04/22/14 Patient complains of copious watery diarrhea for the last 4-6 weeks.  It began in mid July. She denies any bloody diarrhea. She denies any fevers. She denies abdominal pain. She has occasional nausea but no vomiting. She denies any travel outside the country. She is on her fifth month Inlyta chemotherapy for metastatic renal cell carcinoma. She has not had any trouble with diarrhea or GI problems on the chemotherapy prior to now. At approximately the same time the patient's diarrhea began I did increase her levothyroxine to 75 mcg to 112 mcg daily. Her TSH at that time was still subtherapeutic. I have not checked a TSH since. Approximate the same time I did treat the patient for impetigo with minocycline. She is no longer taking that.  She has no known exposure to Clostridium difficile colitis although she is frequently in medical facilities and has a compromised immune system due to chemotherapy. She denies any foods that seem to trigger the diarrhea. She's also noticed a change in the size of the stool caliber. It is now very small and stringy nature. It is also grey chalky white suggesting malabsorption.  She has not tried any probiotics or  antidiarrheal medication.  At that time, my plan was: 1. Diarrhea Patient's physical exam today is essentially normal. I will check a TSH to rule out iatrogenic hyperthyroidism.  Given her recent antibiotic use I will also check for C. difficile colitis. Also perform stool studies to evaluate for gastrointestinal pathogens. If his lab work is normal,treat  the patient for possible malabsorptive diarrhea secondary to chemo.. Consider using Imodium and probiotics. Also consider evaluation for pancreatic insufficiency.  Of note, diarrhea is reported in 55% of patients on Inlyta.   - Clostridium Difficile by PCR - Gastrointestinal Pathogen Panel PCR - CBC  with Differential - COMPLETE METABOLIC PANEL WITH GFR - TSH   05/08/14 Patient is here today to recheck her TSH. She has been on 112 mcg by mouth daily now since July 19. She has been on this dose now for 6 weeks. Ironically her diarrhea has completely resolved. The lab work was unremarkable. It appears that this is a side effect of her chemotherapy agent. At the present time she is doing well physically. She is very sad today on examination. He is approaching the 1 year anniversary of her mother's death Past Medical History  Diagnosis Date  . Papilloma of breast 2012    left. lumpectomy  . Anxiety   . Complication of anesthesia     hard time waking up 20 yrs. ago when having wisdon teeth removed  . Hypertension   . Shortness of breath     WITH EXERTION  . 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  . Lung mass     LUNG MASSES--COUGH-BIOPSY NON-DIAGNOSTIC.  HX OF MEDIASTINAL MASS AND LUNG MASSES AGE 18 -NEGATIVE BIOSPY-BUT GIVEN DIAGNOSIS OF SARCOIDOSIS.  WORK UP CONTINUING ON DIAGNOSIS FOR PT'S LUNG MASSES--SHE HAS BIOPSY PROVEN KIDNEY CANCER  . S/p nephrectomy     left, due to metastatic RCC, 3/14  . Cancer   . Asthma  Past Surgical History  Procedure Laterality Date  . Tonsillectomy  1967  . Bronchial brush biopsy    . Wids    . Wisdom tooth extraction    . Neck lesion biopsy    . Breast biopsy  08/18/2011    Procedure: BREAST BIOPSY WITH NEEDLE LOCALIZATION;  Surgeon: Merrie Roof, MD;  Location: [facility];  Service: General;  Laterality: Left;  left breast biopsy with needle localization  . Video bronchoscopy  10/11/2012    Procedure: VIDEO BRONCHOSCOPY WITH FLUORO;  Surgeon: Kathee Delton, MD;  Location: [facility];  Service: Cardiopulmonary;  Laterality: Bilateral;  .  Laparoscopic nephrectomy Left 11/22/2012    Procedure: LAPAROSCOPIC NEPHRECTOMY;  Surgeon: Dutch Gray, MD;  Location: [facility];  Service: Urology;  Laterality: Left;   Current Outpatient Prescriptions on File Prior to Visit  Medication Sig Dispense Refill  . amLODipine (NORVASC) 10 MG tablet Take 1 tablet (10 mg total) by mouth daily.  90 tablet  3  . axitinib (INLYTA) 1 MG tablet Take 1 mg by mouth 2 (two) times daily. 3 tab po bid      . hydrochlorothiazide (HYDRODIURIL) 25 MG tablet TAKE 1 TABLET (25 MG TOTAL) BY MOUTH DAILY.  30 tablet  11  . HYDROcodone-acetaminophen (NORCO/VICODIN) 5-325 MG per tablet Take 1 tablet by mouth every 4 (four) hours as needed for moderate pain.      Marland Kitchen levothyroxine (SYNTHROID, LEVOTHROID) 112 MCG tablet Take 1 tablet (112 mcg total) by mouth daily.  30 tablet  3  . minocycline (MINOCIN,DYNACIN) 100 MG capsule Take 100 mg by mouth daily.      . mupirocin ointment (BACTROBAN) 2 % Place 1 application into the nose 2 (two) times daily.  22 g  0   No current facility-administered medications on file prior to visit.   No Known Allergies History   Social History  . Marital Status: Married    Spouse Name: N/A    Number of Children: 0  . Years of Education: N/A   Occupational History  . 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    Social History Main Topics  . Smoking status: Former Smoker -- 1.50 packs/day for 40 years    Types: Cigarettes    Quit date: 09/18/2012  . Smokeless tobacco: Never Used     Comment: PT STATES THAT SHE QUIT SMOKING 09/27/11. PATIENT STATES THAT SHE HAS NOT "INHALED" IN LAST 4 YEARS.   Marland Kitchen Alcohol Use: Yes     Comment: Pt reports "it's rare"  . Drug Use: No  . Sexual Activity: Not on file   Other Topics Concern  . Not on file   Social History Narrative  . No narrative on file     Review of Systems  All other systems reviewed and are negative.      Objective:   Physical Exam  Vitals reviewed. Constitutional: She  appears well-developed and well-nourished.  Cardiovascular: Normal rate, regular rhythm and normal heart sounds.   Pulmonary/Chest: Effort normal and breath sounds normal. No respiratory distress. She has no wheezes. She has no rales.  Abdominal: Soft. Bowel sounds are normal. She exhibits no distension and no mass. There is no tenderness. There is no rebound and no guarding.          Assessment & Plan:  Unspecified hypothyroidism - Plan: TSH  Recheck TSH and titrate levothyroxine to achieve a TSH was in the therapeutic range.

## 2014-05-20 ENCOUNTER — Other Ambulatory Visit: Payer: 59

## 2014-05-20 DIAGNOSIS — C649 Malignant neoplasm of unspecified kidney, except renal pelvis: Secondary | ICD-10-CM

## 2014-05-20 DIAGNOSIS — Z79899 Other long term (current) drug therapy: Secondary | ICD-10-CM

## 2014-05-20 DIAGNOSIS — E039 Hypothyroidism, unspecified: Secondary | ICD-10-CM

## 2014-05-20 LAB — CBC WITH DIFFERENTIAL/PLATELET
Basophils Absolute: 0.1 10*3/uL (ref 0.0–0.1)
Basophils Relative: 1 % (ref 0–1)
Eosinophils Absolute: 0.2 10*3/uL (ref 0.0–0.7)
Eosinophils Relative: 2 % (ref 0–5)
HCT: 46.5 % — ABNORMAL HIGH (ref 36.0–46.0)
HEMOGLOBIN: 15.5 g/dL — AB (ref 12.0–15.0)
LYMPHS ABS: 2.3 10*3/uL (ref 0.7–4.0)
Lymphocytes Relative: 27 % (ref 12–46)
MCH: 28.1 pg (ref 26.0–34.0)
MCHC: 33.3 g/dL (ref 30.0–36.0)
MCV: 84.2 fL (ref 78.0–100.0)
MONOS PCT: 6 % (ref 3–12)
Monocytes Absolute: 0.5 10*3/uL (ref 0.1–1.0)
NEUTROS ABS: 5.6 10*3/uL (ref 1.7–7.7)
NEUTROS PCT: 64 % (ref 43–77)
Platelets: 274 10*3/uL (ref 150–400)
RBC: 5.52 MIL/uL — AB (ref 3.87–5.11)
RDW: 16.7 % — ABNORMAL HIGH (ref 11.5–15.5)
WBC: 8.7 10*3/uL (ref 4.0–10.5)

## 2014-05-20 LAB — COMPLETE METABOLIC PANEL WITH GFR
ALT: 42 U/L — AB (ref 0–35)
AST: 38 U/L — ABNORMAL HIGH (ref 0–37)
Albumin: 4 g/dL (ref 3.5–5.2)
Alkaline Phosphatase: 79 U/L (ref 39–117)
BILIRUBIN TOTAL: 0.6 mg/dL (ref 0.2–1.2)
BUN: 18 mg/dL (ref 6–23)
CO2: 26 meq/L (ref 19–32)
CREATININE: 0.85 mg/dL (ref 0.50–1.10)
Calcium: 9.8 mg/dL (ref 8.4–10.5)
Chloride: 101 mEq/L (ref 96–112)
GFR, EST NON AFRICAN AMERICAN: 77 mL/min
GFR, Est African American: 89 mL/min
Glucose, Bld: 98 mg/dL (ref 70–99)
Potassium: 3.9 mEq/L (ref 3.5–5.3)
Sodium: 138 mEq/L (ref 135–145)
Total Protein: 7.3 g/dL (ref 6.0–8.3)

## 2014-05-20 LAB — TSH: TSH: 18.182 u[IU]/mL — ABNORMAL HIGH (ref 0.350–4.500)

## 2014-05-23 NOTE — Progress Notes (Signed)
See note reguarding this patients TSH

## 2014-05-26 ENCOUNTER — Other Ambulatory Visit: Payer: Self-pay | Admitting: *Deleted

## 2014-05-26 DIAGNOSIS — E039 Hypothyroidism, unspecified: Secondary | ICD-10-CM

## 2014-05-26 MED ORDER — LEVOTHYROXINE SODIUM 137 MCG PO TABS: 137.0000 ug | ORAL_TABLET | Freq: Every day | ORAL | Status: DC

## 2014-06-27 ENCOUNTER — Other Ambulatory Visit: Payer: Self-pay | Admitting: Obstetrics & Gynecology

## 2014-06-27 DIAGNOSIS — N63 Unspecified lump in unspecified breast: Secondary | ICD-10-CM

## 2014-07-04 ENCOUNTER — Other Ambulatory Visit: Payer: Self-pay | Admitting: Family Medicine

## 2014-07-04 DIAGNOSIS — N63 Unspecified lump in unspecified breast: Secondary | ICD-10-CM

## 2014-07-10 ENCOUNTER — Other Ambulatory Visit: Payer: Self-pay | Admitting: Family Medicine

## 2014-07-10 ENCOUNTER — Ambulatory Visit
Admission: RE | Admit: 2014-07-10 | Discharge: 2014-07-10 | Disposition: A | Discharge status: Home / Self Care | Payer: 59 | Source: Ambulatory Visit | Attending: Family Medicine | Admitting: Family Medicine

## 2014-07-10 ENCOUNTER — Ambulatory Visit
Admission: RE | Admit: 2014-07-10 | Discharge: 2014-07-10 | Disposition: A | Discharge status: Home / Self Care | Payer: 59 | Source: Ambulatory Visit | Attending: Obstetrics & Gynecology | Admitting: Obstetrics & Gynecology

## 2014-07-10 DIAGNOSIS — N63 Unspecified lump in unspecified breast: Secondary | ICD-10-CM

## 2014-07-10 NOTE — Telephone Encounter (Signed)
Medication refilled per protocol. 

## 2014-07-14 ENCOUNTER — Telehealth: Payer: Self-pay | Admitting: Family Medicine

## 2014-07-14 ENCOUNTER — Other Ambulatory Visit: Payer: 59

## 2014-07-14 DIAGNOSIS — Z79899 Other long term (current) drug therapy: Secondary | ICD-10-CM

## 2014-07-14 DIAGNOSIS — C649 Malignant neoplasm of unspecified kidney, except renal pelvis: Secondary | ICD-10-CM

## 2014-07-14 NOTE — Telephone Encounter (Signed)
Patient called asking to speak to the lab, i am putting the message in for you, because I do not have that option. She said she was calling about labs done at [facility]  Please call her back at [phone]

## 2014-07-15 ENCOUNTER — Encounter: Payer: Self-pay | Admitting: Physician Assistant

## 2014-07-15 ENCOUNTER — Other Ambulatory Visit: Payer: Self-pay | Admitting: Family Medicine

## 2014-07-15 ENCOUNTER — Ambulatory Visit (INDEPENDENT_AMBULATORY_CARE_PROVIDER_SITE_OTHER): Payer: 59 | Admitting: Physician Assistant

## 2014-07-15 VITALS — BP 116/82 | HR 96 | Temp 98.4°F | Resp 18 | Wt 179.0 lb

## 2014-07-15 DIAGNOSIS — N63 Unspecified lump in breast: Secondary | ICD-10-CM

## 2014-07-15 DIAGNOSIS — N2889 Other specified disorders of kidney and ureter: Secondary | ICD-10-CM

## 2014-07-15 DIAGNOSIS — Z905 Acquired absence of kidney: Secondary | ICD-10-CM

## 2014-07-15 DIAGNOSIS — J029 Acute pharyngitis, unspecified: Secondary | ICD-10-CM

## 2014-07-15 DIAGNOSIS — B9689 Other specified bacterial agents as the cause of diseases classified elsewhere: Secondary | ICD-10-CM

## 2014-07-15 DIAGNOSIS — J988 Other specified respiratory disorders: Secondary | ICD-10-CM

## 2014-07-15 DIAGNOSIS — R918 Other nonspecific abnormal finding of lung field: Secondary | ICD-10-CM

## 2014-07-15 DIAGNOSIS — N6320 Unspecified lump in the left breast, unspecified quadrant: Secondary | ICD-10-CM

## 2014-07-15 LAB — CBC WITH DIFFERENTIAL/PLATELET
BASOS PCT: 1 % (ref 0–1)
Basophils Absolute: 0.1 10*3/uL (ref 0.0–0.1)
Eosinophils Absolute: 0.1 10*3/uL (ref 0.0–0.7)
Eosinophils Relative: 1 % (ref 0–5)
HCT: 48.3 % — ABNORMAL HIGH (ref 36.0–46.0)
Hemoglobin: 16.8 g/dL — ABNORMAL HIGH (ref 12.0–15.0)
Lymphocytes Relative: 20 % (ref 12–46)
Lymphs Abs: 2.3 10*3/uL (ref 0.7–4.0)
MCH: 27.7 pg (ref 26.0–34.0)
MCHC: 34.8 g/dL (ref 30.0–36.0)
MCV: 79.7 fL (ref 78.0–100.0)
Monocytes Absolute: 0.8 10*3/uL (ref 0.1–1.0)
Monocytes Relative: 7 % (ref 3–12)
NEUTROS ABS: 8 10*3/uL — AB (ref 1.7–7.7)
NEUTROS PCT: 71 % (ref 43–77)
Platelets: 286 10*3/uL (ref 150–400)
RBC: 6.06 MIL/uL — ABNORMAL HIGH (ref 3.87–5.11)
RDW: 17.3 % — ABNORMAL HIGH (ref 11.5–15.5)
WBC: 11.3 10*3/uL — ABNORMAL HIGH (ref 4.0–10.5)

## 2014-07-15 LAB — COMPLETE METABOLIC PANEL WITH GFR
ALBUMIN: 3.9 g/dL (ref 3.5–5.2)
ALT: 25 U/L (ref 0–35)
AST: 23 U/L (ref 0–37)
Alkaline Phosphatase: 87 U/L (ref 39–117)
BUN: 22 mg/dL (ref 6–23)
CO2: 28 meq/L (ref 19–32)
Calcium: 10.6 mg/dL — ABNORMAL HIGH (ref 8.4–10.5)
Chloride: 97 mEq/L (ref 96–112)
Creat: 0.8 mg/dL (ref 0.50–1.10)
GFR, EST NON AFRICAN AMERICAN: 83 mL/min
GFR, Est African American: >89 mL/min
GLUCOSE: 98 mg/dL (ref 70–99)
POTASSIUM: 4 meq/L (ref 3.5–5.3)
Sodium: 139 mEq/L (ref 135–145)
TOTAL PROTEIN: 7.4 g/dL (ref 6.0–8.3)
Total Bilirubin: 0.6 mg/dL (ref 0.2–1.2)

## 2014-07-15 LAB — LACTATE DEHYDROGENASE: LDH: 151 U/L (ref 94–250)

## 2014-07-15 LAB — RAPID STREP SCREEN (MED CTR MEBANE ONLY): Streptococcus, Group A Screen (Direct): NEGATIVE

## 2014-07-15 MED ORDER — AZITHROMYCIN 250 MG PO TABS
ORAL_TABLET | ORAL | Status: DC
Start: 2014-07-15 — End: 2014-07-18

## 2014-07-15 NOTE — Progress Notes (Signed)
Patient ID: Sheri Hernandez MRN: [id number], DOB: February 17, 1956, 58 y.o. Date of Encounter: 07/15/2014, 4:25 PM    Chief Complaint:  Chief Complaint  Patient presents with  . sick x 4 days    severe sore throat, cold,coughc,congestion     HPI: 58 y.o. year old white female reports the above symptoms.  Says that in the mornings she is quite hoarse and it takes her a while to clear the drainage out of her throat and get her voice back. As well her throat has been sore. She has also had some head and nasal congestion with some mucus from her nose and also some cough. Has had no fever and no chills.     Home Meds:   Outpatient Prescriptions Prior to Visit  Medication Sig Dispense Refill  . amLODipine (NORVASC) 10 MG tablet Take 1 tablet (10 mg total) by mouth daily. 90 tablet 3  . axitinib (INLYTA) 1 MG tablet Take 1 mg by mouth 2 (two) times daily. 3 tab po bid    . hydrochlorothiazide (HYDRODIURIL) 25 MG tablet TAKE 1 TABLET BY MOUTH DAILY 30 tablet 11  . HYDROcodone-acetaminophen (NORCO/VICODIN) 5-325 MG per tablet Take 1 tablet by mouth every 4 (four) hours as needed for moderate pain.    Marland Kitchen levothyroxine (SYNTHROID, LEVOTHROID) 137 MCG tablet Take 1 tablet (137 mcg total) by mouth daily before breakfast. 30 tablet 3  . minocycline (MINOCIN,DYNACIN) 100 MG capsule Take 100 mg by mouth daily.    . mupirocin ointment (BACTROBAN) 2 % Place 1 application into the nose 2 (two) times daily. 22 g 0   No facility-administered medications prior to visit.    Allergies: No Known Allergies    Review of Systems: See HPI for pertinent ROS. All other ROS negative.    Physical Exam: Blood pressure 116/82, pulse 96, temperature 98.4 F (36.9 C), temperature source Oral, resp. rate 18, weight 179 lb (81.194 kg)., Body mass index is 27.22 kg/(m^2). General: WF. Appears in no acute distress. HEENT: Normocephalic, atraumatic, eyes without discharge, sclera non-icteric, nares are without  discharge. Bilateral auditory canals clear, TM's are without perforation, pearly grey and translucent with reflective cone of light bilaterally. Oral cavity moist, posterior pharynx without exudate, erythema, peritonsillar abscess. No tenderness with percussion of frontal and maxillary sinuses bilaterally.  Neck: Supple. No thyromegaly. She does report tenderness with palpation of the cervical lymph nodes but I feel none enlarged. Lungs: Clear bilaterally to auscultation without wheezes, rales, or rhonchi. Breathing is unlabored. Heart: Regular rhythm. No murmurs, rubs, or gallops. Msk:  Strength and tone normal for age. Extremities/Skin: Warm and dry. Neuro: Alert and oriented X 3. Moves all extremities spontaneously. Gait is normal. CNII-XII grossly in tact. Psych:  Responds to questions appropriately with a normal affect.   Results for orders placed or performed in visit on 07/15/14  Rapid Strep Screen  Result Value Ref Range   Source THROAT    Streptococcus, Group A Screen (Direct) NEG NEGATIVE     ASSESSMENT AND PLAN:  58 y.o. year old female with  1. Bacterial respiratory infection - azithromycin (ZITHROMAX) 250 MG tablet; Day 1: Take 2 daily.  Days 2-5: Take 1 daily.  Dispense: 6 tablet; Refill: 0 Take antibiotic as directed. Can use over-the-counter decongestants and cough medications as needed for symptom relief. Follow-up if symptoms worsen significantly or if symptoms do not resolve within 1 week after completion of antibiotic.  2. Sorethroat - Rapid Strep Screen  3. Mass of  left breast  4. Lung mass  5. Renal mass  6. S/p nephrectomy    Signed, Olean Ree [place], [place], [facility] 07/15/2014 4:25 PM

## 2014-07-18 ENCOUNTER — Telehealth: Payer: Self-pay | Admitting: Family Medicine

## 2014-07-18 MED ORDER — LEVOFLOXACIN 500 MG PO TABS: 500.0000 mg | ORAL_TABLET | Freq: Every day | ORAL | Status: DC

## 2014-07-18 NOTE — Telephone Encounter (Signed)
Was at [facility] yesterday.  They did CT scan for cancer.  FYI  Chest tumors have stopped shrinking.  They told her pneumonia seen on CT.  Told follow up with PCP.  Put on Z-pack 07/15/14 by MBD.  Is there something else she should do?

## 2014-07-18 NOTE — Telephone Encounter (Signed)
Pt called and aware of Rx.  Made appt for end of next week.

## 2014-07-18 NOTE — Telephone Encounter (Signed)
Patient is calling to say that she has pneumonia, that is what the hospital said. She would like a call back from you to see what she can do for this?  [phone]

## 2014-07-18 NOTE — Telephone Encounter (Signed)
I need more info.  MBD saw her for sore throat.  I seed no record of her being in the hospital since or er.  Where did she go?.  Did they treat her with abx after they diagnosed her?

## 2014-07-18 NOTE — Telephone Encounter (Signed)
Add levaquin 500 qday for 7 days and recheck next week.

## 2014-07-25 ENCOUNTER — Ambulatory Visit (INDEPENDENT_AMBULATORY_CARE_PROVIDER_SITE_OTHER): Payer: 59 | Admitting: Family Medicine

## 2014-07-25 ENCOUNTER — Encounter: Payer: Self-pay | Admitting: Family Medicine

## 2014-07-25 VITALS — BP 128/72 | HR 76 | Temp 97.8°F | Resp 14 | Ht 68.0 in | Wt 179.0 lb

## 2014-07-25 DIAGNOSIS — J189 Pneumonia, unspecified organism: Secondary | ICD-10-CM

## 2014-07-25 DIAGNOSIS — E039 Hypothyroidism, unspecified: Secondary | ICD-10-CM

## 2014-07-25 LAB — TSH: TSH: 3.806 u[IU]/mL (ref 0.350–4.500)

## 2014-07-25 LAB — CBC WITH DIFFERENTIAL/PLATELET
BASOS ABS: 0.1 10*3/uL (ref 0.0–0.1)
BASOS PCT: 1 % (ref 0–1)
EOS ABS: 0.2 10*3/uL (ref 0.0–0.7)
Eosinophils Relative: 2 % (ref 0–5)
HCT: 46.3 % — ABNORMAL HIGH (ref 36.0–46.0)
Hemoglobin: 16.2 g/dL — ABNORMAL HIGH (ref 12.0–15.0)
Lymphocytes Relative: 25 % (ref 12–46)
Lymphs Abs: 2.2 10*3/uL (ref 0.7–4.0)
MCH: 27.6 pg (ref 26.0–34.0)
MCHC: 35 g/dL (ref 30.0–36.0)
MCV: 78.9 fL (ref 78.0–100.0)
MONOS PCT: 8 % (ref 3–12)
MPV: 10.1 fL (ref 9.4–12.4)
Monocytes Absolute: 0.7 10*3/uL (ref 0.1–1.0)
NEUTROS ABS: 5.6 10*3/uL (ref 1.7–7.7)
NEUTROS PCT: 64 % (ref 43–77)
PLATELETS: 274 10*3/uL (ref 150–400)
RBC: 5.87 MIL/uL — ABNORMAL HIGH (ref 3.87–5.11)
RDW: 16.5 % — ABNORMAL HIGH (ref 11.5–15.5)
WBC: 8.8 10*3/uL (ref 4.0–10.5)

## 2014-07-25 LAB — T4, FREE: FREE T4: 1.62 ng/dL (ref 0.80–1.80)

## 2014-07-25 LAB — T3, FREE: T3 FREE: 2.6 pg/mL (ref 2.3–4.2)

## 2014-07-25 NOTE — Patient Instructions (Addendum)
Complete antibiotics We will call with CBC results  F/U as needed

## 2014-07-25 NOTE — Progress Notes (Signed)
Patient ID: Sheri Hernandez, female   DOB: December 18, 1955, 58 y.o.   MRN: [id number]   Subjective:    Patient ID: Sheri Hernandez, female    DOB: [birth year], 58 y.o.   MRN: [id number]  Patient presents for F/U Pneumonia  Patient here to follow-up. She was seen 10 days ago, diagnosed with respiratory infection and sore throat. She was started on azithromycin, however she had a CT scan at [facility] which she states that the doctor told her that there was pneumonia and therefore she was changed to [place]. She has not had any significant cough has not had any fever or sore throat she feels fine otherwise. During that visit she felt a pain coming down from her clavicles that she had a swallow a pill otherwise at her baseline. She has not had any significant fever. She did have a mildly elevated white blood cell count.  Also the visit that she has not had a repeat thyroid function tests this was drawn today   Review Of Systems:  GEN- denies fatigue, fever, weight loss,weakness, recent illness HEENT- denies eye drainage, change in vision, nasal discharge, CVS- denies chest pain, palpitations RESP- denies SOB, cough, wheeze ABD- denies N/V, change in stools, abd pain GU- denies dysuria, hematuria, dribbling, incontinence MSK- denies joint pain, muscle aches, injury Neuro- denies headache, dizziness, syncope, seizure activity       Objective:    BP 128/72 mmHg  Pulse 76  Temp(Src) 97.8 F (36.6 C) (Oral)  Resp 14  Ht 5\' 8"  (1.727 m)  Wt 179 lb (81.194 kg)  BMI 27.22 kg/m2  SpO2 98% GEN- NAD, alert and oriented x3 HEENT- PERRL, EOMI, non injected sclera, pink conjunctiva, MMM, oropharynx clear Neck- Supple, no LAD CVS- RRR, no murmur RESP-CTAB EXT- No edema Pulses- Radial 2+        Assessment & Plan:      Problem List Items Addressed This Visit    None    Visit Diagnoses    CAP (community acquired pneumonia)    -  Primary - I was unable to see CT scan until after visit,  no evidence of pneumonia on scan, chest is clear, she can complete antibiotics, I did repeat her CBC and she was due for thyroid function panel    Relevant Orders    CT scan results: Status post left nephrectomy for history of renal cell carcinoma.  Interval increase in size of abnormal soft tissue in the left nephrectomy bed with new/increasing para-aortic lymph nodes.  Interval increase in size of abnormal soft tissue adjacent to the transverse/descending colon.   Interval increase in size/number soft tissue nodules adjacent to the left psoas muscle.  Increasing lingular pulmonary nodule.        CBC with Differential    Hypothyroidism, unspecified hypothyroidism type        Relevant Orders       TSH       T3, free       T4, free       Note: This dictation was prepared with Dragon dictation along with smaller phrase technology. Any transcriptional errors that result from this process are unintentional.

## 2014-08-04 ENCOUNTER — Other Ambulatory Visit: Payer: 59

## 2014-08-04 DIAGNOSIS — Z79899 Other long term (current) drug therapy: Secondary | ICD-10-CM

## 2014-08-04 DIAGNOSIS — C649 Malignant neoplasm of unspecified kidney, except renal pelvis: Secondary | ICD-10-CM

## 2014-08-04 LAB — CBC WITH DIFFERENTIAL/PLATELET
BASOS PCT: 1 % (ref 0–1)
Basophils Absolute: 0.1 10*3/uL (ref 0.0–0.1)
EOS PCT: 1 % (ref 0–5)
Eosinophils Absolute: 0.1 10*3/uL (ref 0.0–0.7)
HCT: 46.3 % — ABNORMAL HIGH (ref 36.0–46.0)
Hemoglobin: 15.8 g/dL — ABNORMAL HIGH (ref 12.0–15.0)
Lymphocytes Relative: 25 % (ref 12–46)
Lymphs Abs: 2.6 10*3/uL (ref 0.7–4.0)
MCH: 27.7 pg (ref 26.0–34.0)
MCHC: 34.1 g/dL (ref 30.0–36.0)
MCV: 81.1 fL (ref 78.0–100.0)
MPV: 10.3 fL (ref 9.4–12.4)
Monocytes Absolute: 0.7 10*3/uL (ref 0.1–1.0)
Monocytes Relative: 7 % (ref 3–12)
NEUTROS PCT: 66 % (ref 43–77)
Neutro Abs: 6.9 10*3/uL (ref 1.7–7.7)
PLATELETS: 264 10*3/uL (ref 150–400)
RBC: 5.71 MIL/uL — ABNORMAL HIGH (ref 3.87–5.11)
RDW: 17.5 % — ABNORMAL HIGH (ref 11.5–15.5)
WBC: 10.5 10*3/uL (ref 4.0–10.5)

## 2014-08-04 LAB — COMPLETE METABOLIC PANEL WITH GFR
ALBUMIN: 3.5 g/dL (ref 3.5–5.2)
ALT: 23 U/L (ref 0–35)
AST: 21 U/L (ref 0–37)
Alkaline Phosphatase: 75 U/L (ref 39–117)
BUN: 29 mg/dL — ABNORMAL HIGH (ref 6–23)
CHLORIDE: 104 meq/L (ref 96–112)
CO2: 25 meq/L (ref 19–32)
Calcium: 9.5 mg/dL (ref 8.4–10.5)
Creat: 0.89 mg/dL (ref 0.50–1.10)
GFR, EST AFRICAN AMERICAN: 84 mL/min
GFR, Est Non African American: 73 mL/min
Glucose, Bld: 99 mg/dL (ref 70–99)
POTASSIUM: 4 meq/L (ref 3.5–5.3)
Sodium: 139 mEq/L (ref 135–145)
Total Bilirubin: 0.6 mg/dL (ref 0.2–1.2)
Total Protein: 6.6 g/dL (ref 6.0–8.3)

## 2014-08-11 ENCOUNTER — Encounter: Payer: Self-pay | Admitting: Physician Assistant

## 2014-08-11 ENCOUNTER — Ambulatory Visit (INDEPENDENT_AMBULATORY_CARE_PROVIDER_SITE_OTHER): Payer: 59 | Admitting: Physician Assistant

## 2014-08-11 VITALS — BP 112/78 | HR 96 | Temp 98.4°F | Resp 18 | Wt 182.0 lb

## 2014-08-11 DIAGNOSIS — J029 Acute pharyngitis, unspecified: Secondary | ICD-10-CM

## 2014-08-11 NOTE — Progress Notes (Signed)
    Patient ID: JAMES LAFALCE MRN: [id number], DOB: Jan 05, 1956, 58 y.o. Date of Encounter: 08/11/2014, 11:59 AM    Chief Complaint:  Chief Complaint  Patient presents with  . c/o sore throat    comes and goes, mostly over on left side, question about BP med     HPI: 58 y.o. year old white female says for the past 3 days she has had some sore throat on the left side of her throat off and on. Says that sometimes she will swallow and it will hurt there on that left side but then later in that same day will not feel any discomfort. Also has noticed a sore lymph node on the left neck. Also thinks that she is feeling a little bit of chest congestion and phlegm/drainage. Has been blowing no mucus from her nose. His had no fevers or chills.     Home Meds:   Outpatient Prescriptions Prior to Visit  Medication Sig Dispense Refill  . amLODipine (NORVASC) 10 MG tablet Take 1 tablet (10 mg total) by mouth daily. 90 tablet 3  . hydrochlorothiazide (HYDRODIURIL) 25 MG tablet TAKE 1 TABLET BY MOUTH DAILY 30 tablet 11  . HYDROcodone-acetaminophen (NORCO/VICODIN) 5-325 MG per tablet Take 1 tablet by mouth every 4 (four) hours as needed for moderate pain.    . minocycline (MINOCIN,DYNACIN) 100 MG capsule Take 100 mg by mouth daily.    Marland Kitchen axitinib (INLYTA) 1 MG tablet Take 1 mg by mouth 2 (two) times daily. 3 tab po bid    . levofloxacin (LEVAQUIN) 500 MG tablet Take 1 tablet (500 mg total) by mouth daily. 7 tablet 0  . levothyroxine (SYNTHROID, LEVOTHROID) 137 MCG tablet Take 1 tablet (137 mcg total) by mouth daily before breakfast. 30 tablet 3   No facility-administered medications prior to visit.    Allergies: No Known Allergies    Review of Systems: See HPI for pertinent ROS. All other ROS negative.    Physical Exam: Blood pressure 112/78, pulse 96, temperature 98.4 F (36.9 C), temperature source Oral, resp. rate 18, weight 182 lb (82.555 kg)., Body mass index is 27.68 kg/(m^2). General:  WF.  Appears in no acute distress. HEENT: Normocephalic, atraumatic, eyes without discharge, sclera non-icteric, nares are without discharge. Bilateral auditory canals clear, TM's are without perforation, pearly grey and translucent with reflective cone of light bilaterally. Oral cavity moist, posterior pharynx without exudate,  peritonsillar abscess. Minimal erythema.   Neck: Supple. No thyromegaly. Left cervical lymph node tender but not significantly enlarged. No other cervical lymph nodes are tender or enlarged. Lungs: Clear bilaterally to auscultation without wheezes, rales, or rhonchi. Breathing is unlabored. Heart: Regular rhythm. No murmurs, rubs, or gallops. Msk:  Strength and tone normal for age. Extremities/Skin: Warm and dry.  No rashes. Neuro: Alert and oriented X 3. Moves all extremities spontaneously. Gait is normal. CNII-XII grossly in tact. Psych:  Responds to questions appropriately with a normal affect.     ASSESSMENT AND PLAN:  58 y.o. year old female with  1. Viral pharyngitis She is to continue drinking hot tea with honey because she says this makes her throat felt better. As well she can use lozenges and spray for symptom relief. Follow-up if symptoms worsen and become constant. Or if symptoms are not resolving in one week.   Signed, [address] [place], [place], [facility] 08/11/2014 11:59 AM

## 2014-08-12 ENCOUNTER — Telehealth: Payer: Self-pay | Admitting: *Deleted

## 2014-08-12 NOTE — Telephone Encounter (Signed)
Pt (spoke to husband) concerned about BP continuing to get lower since off Chemo.  Put on BP meds because Chemo made BP go up.  States BP never over 112/.  Very insistent on being told something about her being on BP meds and it being low.  Don't want her to "bottom out"     I told her to stop her BP meds until I get answer for Dr Dennard Schaumann.  Continue to monitor BP readings daily.

## 2014-08-12 NOTE — Telephone Encounter (Signed)
Pt called stating that her BP has been on the low side since being off chemo lower than normal, says she was put on it because she was on chemo and was shooting her BP up, now wants to know what she should do. Please advise.

## 2014-08-13 NOTE — Telephone Encounter (Signed)
.  Stop the HCTZ but continue the amlodipine/Norvasc.  Continue to check blood pressures-- twice daily for the next week. Call us back if blood pressures continue to be less than 115 systolic-- or  If greater than 726 systolic.

## 2014-08-13 NOTE — Telephone Encounter (Addendum)
Spoke to husband.  Given provider recommendations.  Stop HCTZ.  Cont Norvasc.  Take BP BID at home.  If SBP less then 120 or over 140, they are to let us know.  Husband acknowledged understanding.  Do not add or change any medications with out provider directions

## 2014-08-13 NOTE — Addendum Note (Signed)
Addended by: Olena Mater on: 08/13/2014 04:13 PM   Modules accepted: Medications

## 2014-08-18 ENCOUNTER — Other Ambulatory Visit: Payer: Self-pay | Admitting: Dermatology

## 2014-10-29 ENCOUNTER — Other Ambulatory Visit: Payer: Self-pay | Admitting: Family Medicine

## 2014-10-29 DIAGNOSIS — N6310 Unspecified lump in the right breast, unspecified quadrant: Secondary | ICD-10-CM

## 2014-10-29 MED ORDER — LEVOTHYROXINE SODIUM 137 MCG PO TABS: 137.0000 ug | ORAL_TABLET | Freq: Every day | ORAL | Status: DC

## 2014-10-29 NOTE — Telephone Encounter (Signed)
Medication refilled per protocol. 

## 2014-11-06 ENCOUNTER — Ambulatory Visit
Admission: RE | Admit: 2014-11-06 | Discharge: 2014-11-06 | Disposition: A | Discharge status: Home / Self Care | Payer: 59 | Source: Ambulatory Visit | Attending: Family Medicine | Admitting: Family Medicine

## 2014-11-06 DIAGNOSIS — N6310 Unspecified lump in the right breast, unspecified quadrant: Secondary | ICD-10-CM

## 2014-11-07 ENCOUNTER — Ambulatory Visit: Payer: Self-pay | Admitting: Obstetrics & Gynecology

## 2014-11-07 ENCOUNTER — Encounter: Payer: Self-pay | Admitting: Obstetrics & Gynecology

## 2014-11-07 VITALS — BP 125/84 | HR 97 | Temp 98.0°F | Wt 168.8 lb

## 2014-11-07 DIAGNOSIS — Z0000 Encounter for general adult medical examination without abnormal findings: Secondary | ICD-10-CM

## 2014-11-07 NOTE — Progress Notes (Signed)
   Subjective:    Patient ID: Sheri Hernandez, female    DOB: July 05, 1956, 59 y.o.   MRN: [id number]  HPI  This 59 yo lady is merely here to get a pap smear. Her specimen was lost from her last visit.  Her mammogram was done yesterday. She has no complaints.  Review of Systems     Objective:   Physical Exam  Cervix appears tiny and normal NSSA, NT, no adnexal masses palpated      Assessment & Plan:  Preventative care-pap smear done today RTC 1 year/prn sooner

## 2014-11-11 LAB — CYTOLOGY - PAP

## 2014-11-20 ENCOUNTER — Telehealth: Payer: Self-pay | Admitting: Family Medicine

## 2014-11-20 NOTE — Telephone Encounter (Signed)
Patient calling to see if we received results from her her pap at her gynecologist [phone]

## 2014-11-21 NOTE — Telephone Encounter (Signed)
Per [facility] have not seen and results from GYN yet - pt aware via vm

## 2014-12-02 ENCOUNTER — Telehealth: Payer: Self-pay | Admitting: General Practice

## 2014-12-02 NOTE — Telephone Encounter (Signed)
Patient called and left message stating she would like the results of her pap smear on 11/06/14

## 2014-12-03 NOTE — Telephone Encounter (Signed)
Called [facility] and notified her pap smear negative and hpv testing negative. Notified her she should still get a letter from the pathology department. Questions answered.

## 2014-12-09 ENCOUNTER — Encounter: Payer: Self-pay | Admitting: Family Medicine

## 2014-12-09 ENCOUNTER — Ambulatory Visit (INDEPENDENT_AMBULATORY_CARE_PROVIDER_SITE_OTHER): Payer: 59 | Admitting: Family Medicine

## 2014-12-09 VITALS — BP 150/96 | HR 88 | Temp 98.9°F | Resp 18 | Wt 176.0 lb

## 2014-12-09 DIAGNOSIS — J208 Acute bronchitis due to other specified organisms: Secondary | ICD-10-CM | POA: Diagnosis not present

## 2014-12-09 MED ORDER — LEVOFLOXACIN 500 MG PO TABS: 500.0000 mg | ORAL_TABLET | Freq: Every day | ORAL | Status: DC

## 2014-12-09 NOTE — Progress Notes (Signed)
Subjective:    Patient ID: Sheri Hernandez, female    DOB: 04-01-1956, 59 y.o.   MRN: [id number]  HPI Patient symptoms began 3-4 weeks ago. Started with fevers and sinus congestion and postnasal drip. Cough began shortly thereafter. She has had a cough now for over 3 weeks. She has a cough productive of purulent mucus. The patient has pictures on her phone of thick brown green mucoid sputum that she is bringing up a regular basis. Past medical history is significant for metastatic renal cell carcinoma. Most recent CT scan of the lungs showed actual improvement in the lung nodules. This was performed in February 2016. She denies any persistent fevers. She does have trace hemoptysis. She was recently treated with amoxicillin for presumed sinusitis by her oncologist without improvement in her symptoms. Past Medical History  Diagnosis Date  . Papilloma of breast 2012    left. lumpectomy  . Anxiety   . Complication of anesthesia     hard time waking up 20 yrs. ago when having wisdon teeth removed  . Hypertension   . Shortness of breath     WITH EXERTION  . 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  . Lung mass     LUNG MASSES--COUGH-BIOPSY NON-DIAGNOSTIC.  HX OF MEDIASTINAL MASS AND LUNG MASSES AGE 74 -NEGATIVE BIOSPY-BUT GIVEN DIAGNOSIS OF SARCOIDOSIS.  WORK UP CONTINUING ON DIAGNOSIS FOR PT'S LUNG MASSES--SHE HAS BIOPSY PROVEN KIDNEY CANCER  . S/p nephrectomy     left, due to metastatic RCC, 3/14  . Cancer   . Asthma    Past Surgical History  Procedure Laterality Date  . Tonsillectomy  1967  . Bronchial brush biopsy    . Wids    . Wisdom tooth extraction    . Neck lesion biopsy    . Breast biopsy  08/18/2011    Procedure: BREAST BIOPSY WITH NEEDLE LOCALIZATION;  Surgeon: Merrie Roof, MD;  Location: [place];  Service: General;  Laterality: Left;  left breast biopsy with needle localization  . Video bronchoscopy  10/11/2012    Procedure: VIDEO BRONCHOSCOPY WITH FLUORO;  Surgeon: Kathee Delton, MD;  Location: [facility];  Service: Cardiopulmonary;  Laterality: Bilateral;  . Laparoscopic nephrectomy Left 11/22/2012    Procedure: LAPAROSCOPIC NEPHRECTOMY;  Surgeon: Dutch Gray, MD;  Location: [facility];  Service: Urology;  Laterality: Left;   Current Outpatient Prescriptions on File Prior to Visit  Medication Sig Dispense Refill  . amLODipine (NORVASC) 10 MG tablet Take 1 tablet (10 mg total) by mouth daily. (Patient taking differently: Take 10 mg by mouth every other day. ) 90 tablet 3  . HYDROcodone-acetaminophen (NORCO/VICODIN) 5-325 MG per tablet Take 1 tablet by mouth every 4 (four) hours as needed for moderate pain.    Marland Kitchen levothyroxine (SYNTHROID, LEVOTHROID) 137 MCG tablet Take 1 tablet (137 mcg total) by mouth daily before breakfast. 30 tablet 5  . Nivolumab (OPDIVO IV) Inject into the vein every 21 ( twenty-one) days.    . minocycline (MINOCIN,DYNACIN) 100 MG capsule Take 100 mg by mouth daily.     No current facility-administered medications on file prior to visit.   No Known Allergies History   Social History  . Marital Status: Married    Spouse  Name: N/A  . Number of Children: 0  . Years of Education: N/A   Occupational History  . 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    Social History Main Topics  . Smoking status: Former Smoker -- 1.50 packs/day for 40 years    Types: Cigarettes    Quit date: 09/18/2012  . Smokeless tobacco: Never Used     Comment: PT STATES THAT SHE QUIT SMOKING 09/27/11. PATIENT STATES THAT SHE HAS NOT "INHALED" IN LAST 4 YEARS.   Marland Kitchen Alcohol Use: No     Comment: Pt reports "it's rare"  . Drug Use: No  . Sexual Activity: Not on file   Other Topics Concern  . Not on file   Social History Narrative      Review of Systems    All other systems reviewed and are negative.      Objective:   Physical Exam  Constitutional: She appears well-developed and well-nourished.  HENT:  Right Ear: External ear normal.  Left Ear: External ear normal.  Nose: Nose normal.  Mouth/Throat: Oropharynx is clear and moist. No oropharyngeal exudate.  Neck: Neck supple.  Cardiovascular: Normal rate, regular rhythm, normal heart sounds and intact distal pulses.   No murmur heard. Pulmonary/Chest: Effort normal and breath sounds normal. No respiratory distress. She has no wheezes. She has no rales.  Lymphadenopathy:    She has no cervical adenopathy.  Vitals reviewed.         Assessment & Plan:  Acute bronchitis due to other specified organisms - Plan: levofloxacin (LEVAQUIN) 500 MG tablet  Given the patient's past medical history, the persistence of her cough, her underlying emphysema, and the purulent nature of the sputum, I will start the patient on Levaquin 500 mg by mouth daily for 7 days. Recheck in one week if no better or sooner if worse.

## 2014-12-29 ENCOUNTER — Ambulatory Visit (INDEPENDENT_AMBULATORY_CARE_PROVIDER_SITE_OTHER): Payer: 59 | Admitting: Physician Assistant

## 2014-12-29 ENCOUNTER — Encounter: Payer: Self-pay | Admitting: Physician Assistant

## 2014-12-29 VITALS — BP 142/92 | HR 88 | Temp 98.7°F | Resp 18 | Wt 175.0 lb

## 2014-12-29 DIAGNOSIS — M7989 Other specified soft tissue disorders: Secondary | ICD-10-CM

## 2014-12-29 NOTE — Progress Notes (Signed)
    Patient ID: Sheri Hernandez MRN: [id number], DOB: 11-04-1955, 59 y.o. Date of Encounter: 12/29/2014, 4:21 PM    Chief Complaint:  Chief Complaint  Patient presents with  . swelling in rt foot/leg     HPI: 59 y.o. year old female her for evaluation of the above.  She shows me pictures from CT scans on her phone. Tumor size has shrunk significantly within just a past few months and she is very excited ! She receives her oncology treatment at [facility]. Says that she has blood work with them every 2 weeks. Told her I do not have those results and she brings them up on her phone. 12/25/14 CME T was normal. Says that the right foot has been swelling over the past 3-4 days.  Says that prior to this, the right foot has swollen off and on at times. However, she reports that she has had no prior history of injury to the right foot or ankle as far she is aware. No remote fracture etc.  Also no acute injury in the past few days either. Has not "twisted her ankle ". Has had no fall trauma or injury.     Home Meds:   Outpatient Prescriptions Prior to Visit  Medication Sig Dispense Refill  . amLODipine (NORVASC) 10 MG tablet Take 1 tablet (10 mg total) by mouth daily. (Patient taking differently: Take 10 mg by mouth every other day. ) 90 tablet 3  . aspirin 81 MG tablet Take 81 mg by mouth daily.    Marland Kitchen HYDROcodone-acetaminophen (NORCO/VICODIN) 5-325 MG per tablet Take 1 tablet by mouth every 4 (four) hours as needed for moderate pain.    Marland Kitchen levothyroxine (SYNTHROID, LEVOTHROID) 137 MCG tablet Take 1 tablet (137 mcg total) by mouth daily before breakfast. 30 tablet 5  . Nivolumab (OPDIVO IV) Inject into the vein every 21 ( twenty-one) days.    Marland Kitchen levofloxacin (LEVAQUIN) 500 MG tablet Take 1 tablet (500 mg total) by mouth daily. 7 tablet 0  . minocycline (MINOCIN,DYNACIN) 100 MG capsule Take 100 mg by mouth daily.     No facility-administered medications prior to visit.     Allergies: No Known Allergies    Review of Systems: See HPI for pertinent ROS. All other ROS negative.    Physical Exam: Blood pressure 142/92, pulse 88, temperature 98.7 F (37.1 C), temperature source Oral, resp. rate 18, weight 175 lb (79.379 kg)., Body mass index is 26.61 kg/(m^2). General:  WNWD WF. Appears in no acute distress. Neck: Supple. No thyromegaly. No lymphadenopathy. Lungs: Clear bilaterally to auscultation without wheezes, rales, or rhonchi. Breathing is unlabored. Heart: Regular rhythm. No murmurs, rubs, or gallops. Msk:  Strength and tone normal for age. Extremities/Skin: Warm and dry. Right Foot and ankle are swollen. Right lower leg slightly larger than left.  No erythema. No warmth. No palpable cord.  Neuro: Alert and oriented X 3. Moves all extremities spontaneously. Gait is normal. CNII-XII grossly in tact. Psych:  Responds to questions appropriately with a normal affect.     ASSESSMENT AND PLAN:  59 y.o. year old female with  1. Swelling of right lower extremity Her cancer puts her at increased risk for blood clots.  Will obtain venous duplex. Will follow-up with her once we get these results. - Lower Extremity Venous Duplex Right; Future--stat--will be scheduled now.   [address] [place], [place], [facility] 12/29/2014 4:21 PM

## 2014-12-30 ENCOUNTER — Telehealth: Payer: Self-pay | Admitting: *Deleted

## 2014-12-30 NOTE — Telephone Encounter (Signed)
Pt called stating she is wanting a referral to cardiologist d/t having chest pains, palpitations and had to have US done today at [facility] and could not catch her breathe. Pt states she called cardiologist but they didn't have any appts until June and wanted to be seen this week. Pt was almost in tears stating she is scared that something major is wrong and would like to be seen by cardiologist. I called [place] cardiologist and they will see pt on Thursday April 28 at 3pm arrive at 2:30pm. Left message on vm to return my call for appt.

## 2014-12-31 ENCOUNTER — Telehealth: Payer: Self-pay | Admitting: Family Medicine

## 2014-12-31 MED ORDER — HYDROCHLOROTHIAZIDE 12.5 MG PO CAPS: ORAL_CAPSULE | ORAL | Status: DC

## 2014-12-31 NOTE — Telephone Encounter (Signed)
Pt called back and aware of appt 

## 2014-12-31 NOTE — Telephone Encounter (Signed)
Pt sent for US-Venous RLE.  Provider has rec'd results.  Per provider no evidence of DVT seen.  Recommend she use some medication to get rid of some of the fluid/swelling.  Order HCTZ 12.5 mg QD for 3 days then use just PRN for swelling. Rx to pharmacy.  Have left pt message to call back for these results

## 2014-12-31 NOTE — Telephone Encounter (Signed)
Pt called back and has been made aware of test results and new RX.

## 2015-01-02 ENCOUNTER — Encounter (HOSPITAL_COMMUNITY)
Admission: EM | Disposition: A | Discharge status: Skilled Nursing Facility | Payer: 59 | Source: Home / Self Care | Attending: Cardiothoracic Surgery

## 2015-01-02 ENCOUNTER — Inpatient Hospital Stay (HOSPITAL_COMMUNITY)
Admission: EM | Admit: 2015-01-02 | Discharge: 2015-01-09 | DRG: 234 | Disposition: A | Discharge status: Skilled Nursing Facility | Payer: 59 | Attending: Cardiothoracic Surgery | Admitting: Cardiothoracic Surgery

## 2015-01-02 ENCOUNTER — Inpatient Hospital Stay (HOSPITAL_COMMUNITY): Payer: 59 | Admitting: Certified Registered Nurse Anesthetist

## 2015-01-02 ENCOUNTER — Encounter (HOSPITAL_COMMUNITY): Payer: Self-pay

## 2015-01-02 ENCOUNTER — Emergency Department (HOSPITAL_COMMUNITY): Payer: 59

## 2015-01-02 DIAGNOSIS — I214 Non-ST elevation (NSTEMI) myocardial infarction: Secondary | ICD-10-CM | POA: Diagnosis not present

## 2015-01-02 DIAGNOSIS — I200 Unstable angina: Secondary | ICD-10-CM

## 2015-01-02 DIAGNOSIS — Z905 Acquired absence of kidney: Secondary | ICD-10-CM | POA: Diagnosis present

## 2015-01-02 DIAGNOSIS — D869 Sarcoidosis, unspecified: Secondary | ICD-10-CM | POA: Diagnosis present

## 2015-01-02 DIAGNOSIS — I2119 ST elevation (STEMI) myocardial infarction involving other coronary artery of inferior wall: Secondary | ICD-10-CM | POA: Diagnosis present

## 2015-01-02 DIAGNOSIS — I2510 Atherosclerotic heart disease of native coronary artery without angina pectoris: Secondary | ICD-10-CM | POA: Diagnosis not present

## 2015-01-02 DIAGNOSIS — C7800 Secondary malignant neoplasm of unspecified lung: Secondary | ICD-10-CM | POA: Diagnosis present

## 2015-01-02 DIAGNOSIS — Z09 Encounter for follow-up examination after completed treatment for conditions other than malignant neoplasm: Secondary | ICD-10-CM

## 2015-01-02 DIAGNOSIS — Z87891 Personal history of nicotine dependence: Secondary | ICD-10-CM

## 2015-01-02 DIAGNOSIS — I10 Essential (primary) hypertension: Secondary | ICD-10-CM | POA: Diagnosis present

## 2015-01-02 DIAGNOSIS — D62 Acute posthemorrhagic anemia: Secondary | ICD-10-CM | POA: Diagnosis not present

## 2015-01-02 DIAGNOSIS — Z85528 Personal history of other malignant neoplasm of kidney: Secondary | ICD-10-CM

## 2015-01-02 DIAGNOSIS — R339 Retention of urine, unspecified: Secondary | ICD-10-CM | POA: Diagnosis not present

## 2015-01-02 DIAGNOSIS — J9811 Atelectasis: Secondary | ICD-10-CM | POA: Diagnosis not present

## 2015-01-02 DIAGNOSIS — R079 Chest pain, unspecified: Secondary | ICD-10-CM | POA: Diagnosis present

## 2015-01-02 DIAGNOSIS — E8770 Fluid overload, unspecified: Secondary | ICD-10-CM | POA: Diagnosis not present

## 2015-01-02 DIAGNOSIS — F419 Anxiety disorder, unspecified: Secondary | ICD-10-CM | POA: Diagnosis present

## 2015-01-02 DIAGNOSIS — Z7982 Long term (current) use of aspirin: Secondary | ICD-10-CM | POA: Diagnosis not present

## 2015-01-02 DIAGNOSIS — I25110 Atherosclerotic heart disease of native coronary artery with unstable angina pectoris: Secondary | ICD-10-CM | POA: Diagnosis not present

## 2015-01-02 DIAGNOSIS — I249 Acute ischemic heart disease, unspecified: Secondary | ICD-10-CM | POA: Diagnosis present

## 2015-01-02 DIAGNOSIS — Z951 Presence of aortocoronary bypass graft: Secondary | ICD-10-CM

## 2015-01-02 DIAGNOSIS — I252 Old myocardial infarction: Secondary | ICD-10-CM | POA: Insufficient documentation

## 2015-01-02 HISTORY — PX: LEFT HEART CATHETERIZATION WITH CORONARY ANGIOGRAM: SHX5451

## 2015-01-02 HISTORY — PX: CORONARY ARTERY BYPASS GRAFT: SHX141

## 2015-01-02 LAB — CBC
HEMATOCRIT: 36 % (ref 36.0–46.0)
Hemoglobin: 11.7 g/dL — ABNORMAL LOW (ref 12.0–15.0)
MCH: 25.9 pg — ABNORMAL LOW (ref 26.0–34.0)
MCHC: 32.5 g/dL (ref 30.0–36.0)
MCV: 79.6 fL (ref 78.0–100.0)
PLATELETS: 226 10*3/uL (ref 150–400)
RBC: 4.52 MIL/uL (ref 3.87–5.11)
RDW: 18.4 % — ABNORMAL HIGH (ref 11.5–15.5)
WBC: 8.1 10*3/uL (ref 4.0–10.5)

## 2015-01-02 LAB — BASIC METABOLIC PANEL
ANION GAP: 9 (ref 5–15)
BUN: 17 mg/dL (ref 6–23)
CHLORIDE: 104 mmol/L (ref 96–112)
CO2: 26 mmol/L (ref 19–32)
Calcium: 10 mg/dL (ref 8.4–10.5)
Creatinine, Ser: 0.84 mg/dL (ref 0.50–1.10)
GFR calc non Af Amer: 76 mL/min — ABNORMAL LOW (ref >90)
GFR, EST AFRICAN AMERICAN: 88 mL/min — AB (ref >90)
Glucose, Bld: 120 mg/dL — ABNORMAL HIGH (ref 70–99)
Potassium: 3.8 mmol/L (ref 3.5–5.1)
SODIUM: 139 mmol/L (ref 135–145)

## 2015-01-02 LAB — I-STAT TROPONIN, ED: TROPONIN I, POC: 0.16 ng/mL — AB (ref 0.00–0.08)

## 2015-01-02 LAB — ABO/RH: ABO/RH(D): O POS

## 2015-01-02 SURGERY — LEFT HEART CATHETERIZATION WITH CORONARY ANGIOGRAM
Anesthesia: LOCAL

## 2015-01-02 SURGERY — CORONARY ARTERY BYPASS GRAFTING (CABG)
Anesthesia: General | Site: Chest

## 2015-01-02 MED ORDER — PLASMA-LYTE 148 IV SOLN
INTRAVENOUS | Status: DC | PRN
  Administered 2015-01-02: 500 mL via INTRAVASCULAR

## 2015-01-02 MED ORDER — MIDAZOLAM HCL 10 MG/2ML IJ SOLN
INTRAMUSCULAR | Status: AC
  Filled 2015-01-02: qty 2

## 2015-01-02 MED ORDER — VERAPAMIL HCL 2.5 MG/ML IV SOLN
INTRAVENOUS | Status: AC
  Filled 2015-01-02: qty 2

## 2015-01-02 MED ORDER — NITROGLYCERIN IN D5W 200-5 MCG/ML-% IV SOLN
INTRAVENOUS | Status: AC
  Administered 2015-01-03: 5 ug/min via INTRAVENOUS
  Filled 2015-01-02: qty 250

## 2015-01-02 MED ORDER — PLASMA-LYTE 148 IV SOLN
INTRAVENOUS | Status: DC
  Filled 2015-01-02: qty 2.5

## 2015-01-02 MED ORDER — MIDAZOLAM HCL 2 MG/2ML IJ SOLN
INTRAMUSCULAR | Status: AC
  Administered 2015-01-03: 2 mg via INTRAVENOUS
  Filled 2015-01-02: qty 2

## 2015-01-02 MED ORDER — DOPAMINE-DEXTROSE 3.2-5 MG/ML-% IV SOLN
0.0000 ug/kg/min | INTRAVENOUS | Status: DC
  Filled 2015-01-02: qty 250

## 2015-01-02 MED ORDER — HEPARIN BOLUS VIA INFUSION
4000.0000 [IU] | Freq: Once | INTRAVENOUS | Status: AC
  Administered 2015-01-02: 4000 [IU] via INTRAVENOUS
  Filled 2015-01-02: qty 4000

## 2015-01-02 MED ORDER — BIVALIRUDIN 250 MG IV SOLR
INTRAVENOUS | Status: AC
  Filled 2015-01-02: qty 250

## 2015-01-02 MED ORDER — 0.9 % SODIUM CHLORIDE (POUR BTL) OPTIME
TOPICAL | Status: DC | PRN
Start: 2015-01-02 — End: 2015-01-03
  Administered 2015-01-02: 6000 mL

## 2015-01-02 MED ORDER — VANCOMYCIN HCL 1000 MG IV SOLR
1000.0000 mg | INTRAVENOUS | Status: DC | PRN
  Administered 2015-01-02: 1000 mg via INTRAVENOUS

## 2015-01-02 MED ORDER — METOPROLOL TARTRATE 1 MG/ML IV SOLN
INTRAVENOUS | Status: AC
  Administered 2015-01-04: 5 mg via INTRAVENOUS
  Filled 2015-01-02: qty 5

## 2015-01-02 MED ORDER — METOPROLOL TARTRATE 1 MG/ML IV SOLN
INTRAVENOUS | Status: AC
  Filled 2015-01-02: qty 5

## 2015-01-02 MED ORDER — MIDAZOLAM HCL 2 MG/2ML IJ SOLN
INTRAMUSCULAR | Status: AC
  Filled 2015-01-02: qty 2

## 2015-01-02 MED ORDER — LACTATED RINGERS IV SOLN
INTRAVENOUS | Status: DC | PRN
  Administered 2015-01-02: 22:00:00 via INTRAVENOUS

## 2015-01-02 MED ORDER — PROPOFOL 10 MG/ML IV BOLUS
INTRAVENOUS | Status: AC
  Filled 2015-01-02: qty 20

## 2015-01-02 MED ORDER — SUCCINYLCHOLINE CHLORIDE 20 MG/ML IJ SOLN
INTRAMUSCULAR | Status: DC | PRN
  Administered 2015-01-02: 120 mg via INTRAVENOUS

## 2015-01-02 MED ORDER — NOREPINEPHRINE BITARTRATE 1 MG/ML IV SOLN
0.0000 ug/min | INTRAVENOUS | Status: DC
  Filled 2015-01-02: qty 4

## 2015-01-02 MED ORDER — SODIUM CHLORIDE 0.9 % IV SOLN
1.7500 mg/kg/h | INTRAVENOUS | Status: DC
  Filled 2015-01-02: qty 250

## 2015-01-02 MED ORDER — HEPARIN (PORCINE) IN NACL 2-0.9 UNIT/ML-% IJ SOLN
INTRAMUSCULAR | Status: AC
  Filled 2015-01-02: qty 1000

## 2015-01-02 MED ORDER — ROCURONIUM BROMIDE 50 MG/5ML IV SOLN
INTRAVENOUS | Status: AC
  Filled 2015-01-02: qty 2

## 2015-01-02 MED ORDER — POTASSIUM CHLORIDE 2 MEQ/ML IV SOLN
80.0000 meq | INTRAVENOUS | Status: DC
  Filled 2015-01-02: qty 40

## 2015-01-02 MED ORDER — MAGNESIUM SULFATE 50 % IJ SOLN
40.0000 meq | INTRAMUSCULAR | Status: DC
  Filled 2015-01-02: qty 10

## 2015-01-02 MED ORDER — ASPIRIN 81 MG PO CHEW
CHEWABLE_TABLET | ORAL | Status: AC
  Filled 2015-01-02: qty 4

## 2015-01-02 MED ORDER — FENTANYL CITRATE (PF) 100 MCG/2ML IJ SOLN
INTRAMUSCULAR | Status: AC
  Filled 2015-01-02: qty 2

## 2015-01-02 MED ORDER — HEPARIN (PORCINE) IN NACL 100-0.45 UNIT/ML-% IJ SOLN
1000.0000 [IU]/h | INTRAMUSCULAR | Status: DC
  Filled 2015-01-02: qty 250

## 2015-01-02 MED ORDER — HEMOSTATIC AGENTS (NO CHARGE) OPTIME
TOPICAL | Status: DC | PRN
  Administered 2015-01-02: 1 via TOPICAL

## 2015-01-02 MED ORDER — HEPARIN SODIUM (PORCINE) 1000 UNIT/ML IJ SOLN
INTRAMUSCULAR | Status: AC
  Filled 2015-01-02: qty 1

## 2015-01-02 MED ORDER — VANCOMYCIN HCL 10 G IV SOLR
1250.0000 mg | INTRAVENOUS | Status: DC
  Filled 2015-01-02: qty 1250

## 2015-01-02 MED ORDER — FENTANYL CITRATE (PF) 250 MCG/5ML IJ SOLN
INTRAMUSCULAR | Status: AC
  Filled 2015-01-02: qty 5

## 2015-01-02 MED ORDER — HEPARIN SODIUM (PORCINE) 1000 UNIT/ML IJ SOLN
INTRAMUSCULAR | Status: DC | PRN
  Administered 2015-01-02: 24000 [IU] via INTRAVENOUS

## 2015-01-02 MED ORDER — EPINEPHRINE HCL 1 MG/ML IJ SOLN
0.0000 ug/min | INTRAVENOUS | Status: DC
  Filled 2015-01-02: qty 4

## 2015-01-02 MED ORDER — ROCURONIUM BROMIDE 100 MG/10ML IV SOLN
INTRAVENOUS | Status: DC | PRN
  Administered 2015-01-02: 50 mg via INTRAVENOUS
  Administered 2015-01-03: 20 mg via INTRAVENOUS

## 2015-01-02 MED ORDER — DEXMEDETOMIDINE HCL IN NACL 400 MCG/100ML IV SOLN
0.1000 ug/kg/h | INTRAVENOUS | Status: DC
  Administered 2015-01-02: 0.4 ug/kg/h via INTRAVENOUS
  Filled 2015-01-02: qty 100

## 2015-01-02 MED ORDER — DEXTROSE 5 % IV SOLN
750.0000 mg | INTRAVENOUS | Status: DC
  Filled 2015-01-02: qty 750

## 2015-01-02 MED ORDER — HYDROMORPHONE HCL 1 MG/ML IJ SOLN
INTRAMUSCULAR | Status: AC
  Filled 2015-01-02: qty 1

## 2015-01-02 MED ORDER — DEXTROSE 5 % IV SOLN
1.5000 g | INTRAVENOUS | Status: DC | PRN
  Administered 2015-01-02: 1.5 g via INTRAVENOUS
  Administered 2015-01-03: .75 g via INTRAVENOUS

## 2015-01-02 MED ORDER — SODIUM CHLORIDE 0.9 % IV SOLN
INTRAVENOUS | Status: DC
  Filled 2015-01-02: qty 30

## 2015-01-02 MED ORDER — PHENYLEPHRINE HCL 10 MG/ML IJ SOLN
INTRAMUSCULAR | Status: DC | PRN
  Administered 2015-01-02 (×3): 80 ug via INTRAVENOUS

## 2015-01-02 MED ORDER — NITROGLYCERIN IN D5W 200-5 MCG/ML-% IV SOLN
2.0000 ug/min | INTRAVENOUS | Status: DC
  Administered 2015-01-02: 5 ug/min via INTRAVENOUS
  Filled 2015-01-02: qty 250

## 2015-01-02 MED ORDER — ARTIFICIAL TEARS OP OINT
TOPICAL_OINTMENT | OPHTHALMIC | Status: DC | PRN
  Administered 2015-01-02: 1 via OPHTHALMIC

## 2015-01-02 MED ORDER — NITROGLYCERIN 1 MG/10 ML FOR IR/CATH LAB
INTRA_ARTERIAL | Status: AC
  Filled 2015-01-02: qty 10

## 2015-01-02 MED ORDER — SODIUM CHLORIDE 0.9 % IV SOLN
INTRAVENOUS | Status: DC
  Administered 2015-01-02: 69.8 mL via INTRAVENOUS
  Filled 2015-01-02: qty 40

## 2015-01-02 MED ORDER — SODIUM CHLORIDE 0.9 % IV SOLN
INTRAVENOUS | Status: DC
  Administered 2015-01-02: 2.5 [IU]/h via INTRAVENOUS
  Filled 2015-01-02: qty 2.5

## 2015-01-02 MED ORDER — TICAGRELOR 90 MG PO TABS
ORAL_TABLET | ORAL | Status: AC
  Filled 2015-01-02: qty 2

## 2015-01-02 MED ORDER — LIDOCAINE HCL (PF) 1 % IJ SOLN
INTRAMUSCULAR | Status: AC
  Filled 2015-01-02: qty 30

## 2015-01-02 MED ORDER — PHENYLEPHRINE HCL 10 MG/ML IJ SOLN
10.0000 mg | INTRAVENOUS | Status: DC | PRN
  Administered 2015-01-02: 30 ug/min via INTRAVENOUS

## 2015-01-02 MED ORDER — ETOMIDATE 2 MG/ML IV SOLN
INTRAVENOUS | Status: DC | PRN
  Administered 2015-01-02: 4 mg via INTRAVENOUS

## 2015-01-02 MED ORDER — FENTANYL CITRATE (PF) 100 MCG/2ML IJ SOLN
INTRAMUSCULAR | Status: DC | PRN
  Administered 2015-01-02: 50 ug via INTRAVENOUS
  Administered 2015-01-02: 100 ug via INTRAVENOUS
  Administered 2015-01-02 (×2): 50 ug via INTRAVENOUS
  Administered 2015-01-02: 250 ug via INTRAVENOUS
  Administered 2015-01-02: 50 ug via INTRAVENOUS
  Administered 2015-01-02: 150 ug via INTRAVENOUS
  Administered 2015-01-02: 50 ug via INTRAVENOUS
  Administered 2015-01-02: 500 ug via INTRAVENOUS
  Administered 2015-01-03: 250 ug via INTRAVENOUS

## 2015-01-02 MED ORDER — SODIUM CHLORIDE 0.9 % IJ SOLN
OROMUCOSAL | Status: DC | PRN
  Administered 2015-01-02 (×3): via TOPICAL

## 2015-01-02 MED ORDER — DEXTROSE 5 % IV SOLN
1.5000 g | INTRAVENOUS | Status: DC
  Filled 2015-01-02: qty 1.5

## 2015-01-02 MED ORDER — DEXTROSE 5 % IV SOLN
30.0000 ug/min | INTRAVENOUS | Status: DC
  Filled 2015-01-02: qty 2

## 2015-01-02 SURGICAL SUPPLY — 73 items
ADAPTER MULTI PERFUSION 15 (ADAPTER) ×2 IMPLANT
BAG DECANTER FOR FLEXI CONT (MISCELLANEOUS) ×2 IMPLANT
BANDAGE ELASTIC 4 VELCRO ST LF (GAUZE/BANDAGES/DRESSINGS) ×2 IMPLANT
BANDAGE ELASTIC 6 VELCRO ST LF (GAUZE/BANDAGES/DRESSINGS) ×2 IMPLANT
BLADE STERNUM SYSTEM 6 (BLADE) ×2 IMPLANT
BNDG GAUZE ELAST 4 BULKY (GAUZE/BANDAGES/DRESSINGS) ×2 IMPLANT
CANISTER SUCTION 2500CC (MISCELLANEOUS) ×2 IMPLANT
CANNULA VEN 2 STAGE (MISCELLANEOUS) ×2 IMPLANT
CATH CPB KIT GERHARDT (MISCELLANEOUS) ×2 IMPLANT
CATH THORACIC 28FR (CATHETERS) ×2 IMPLANT
CRADLE DONUT ADULT HEAD (MISCELLANEOUS) ×2 IMPLANT
DERMABOND ADVANCED (GAUZE/BANDAGES/DRESSINGS) ×1
DERMABOND ADVANCED .7 DNX12 (GAUZE/BANDAGES/DRESSINGS) ×1 IMPLANT
DRAIN CHANNEL 10M FLAT 3/4 FLT (DRAIN) ×2 IMPLANT
DRAIN CHANNEL 28F RND 3/8 FF (WOUND CARE) ×2 IMPLANT
DRAPE CARDIOVASCULAR INCISE (DRAPES) ×1
DRAPE SLUSH/WARMER DISC (DRAPES) ×2 IMPLANT
DRAPE SRG 135X102X78XABS (DRAPES) ×1 IMPLANT
DRSG AQUACEL AG ADV 3.5X14 (GAUZE/BANDAGES/DRESSINGS) ×2 IMPLANT
ELECT BLADE 4.0 EZ CLEAN MEGAD (MISCELLANEOUS) ×2
ELECT REM PT RETURN 9FT ADLT (ELECTROSURGICAL) ×4
ELECTRODE BLDE 4.0 EZ CLN MEGD (MISCELLANEOUS) ×1 IMPLANT
ELECTRODE REM PT RTRN 9FT ADLT (ELECTROSURGICAL) ×2 IMPLANT
EVACUATOR SILICONE 100CC (DRAIN) ×2 IMPLANT
GAUZE SPONGE 4X4 12PLY STRL (GAUZE/BANDAGES/DRESSINGS) ×4 IMPLANT
GLOVE BIO SURGEON STRL SZ 6.5 (GLOVE) ×12 IMPLANT
GLOVE BIOGEL PI IND STRL 6 (GLOVE) ×1 IMPLANT
GLOVE BIOGEL PI INDICATOR 6 (GLOVE) ×1
GOWN STRL REUS W/ TWL LRG LVL3 (GOWN DISPOSABLE) ×6 IMPLANT
GOWN STRL REUS W/TWL LRG LVL3 (GOWN DISPOSABLE) ×6
HEMOSTAT POWDER SURGIFOAM 1G (HEMOSTASIS) ×6 IMPLANT
HEMOSTAT SURGICEL 2X14 (HEMOSTASIS) ×2 IMPLANT
KIT BASIN OR (CUSTOM PROCEDURE TRAY) ×2 IMPLANT
KIT CATH SUCT 8FR (CATHETERS) ×2 IMPLANT
KIT ROOM TURNOVER OR (KITS) ×2 IMPLANT
KIT SUCTION CATH 14FR (SUCTIONS) ×2 IMPLANT
KIT VASOVIEW W/TROCAR VH 2000 (KITS) ×2 IMPLANT
LEAD PACING MYOCARDI (MISCELLANEOUS) ×2 IMPLANT
MARKER GRAFT CORONARY BYPASS (MISCELLANEOUS) ×6 IMPLANT
NS IRRIG 1000ML POUR BTL (IV SOLUTION) ×12 IMPLANT
PACK OPEN HEART (CUSTOM PROCEDURE TRAY) ×2 IMPLANT
PAD ARMBOARD 7.5X6 YLW CONV (MISCELLANEOUS) ×4 IMPLANT
PAD ELECT DEFIB RADIOL ZOLL (MISCELLANEOUS) ×2 IMPLANT
PENCIL BUTTON HOLSTER BLD 10FT (ELECTRODE) ×2 IMPLANT
PUNCH AORTIC ROTATE 4.5MM 8IN (MISCELLANEOUS) ×2 IMPLANT
SET CARDIOPLEGIA MPS 5001102 (MISCELLANEOUS) ×2 IMPLANT
SPOGE SURGIFLO 8M (HEMOSTASIS) ×1
SPONGE GAUZE 4X4 12PLY STER LF (GAUZE/BANDAGES/DRESSINGS) ×4 IMPLANT
SPONGE LAP 18X18 X RAY DECT (DISPOSABLE) ×4 IMPLANT
SPONGE SURGIFLO 8M (HEMOSTASIS) ×1 IMPLANT
SUT BONE WAX W31G (SUTURE) ×2 IMPLANT
SUT ETHILON 3 0 FSL (SUTURE) ×2 IMPLANT
SUT MNCRL AB 4-0 PS2 18 (SUTURE) ×4 IMPLANT
SUT PROLENE 3 0 SH1 36 (SUTURE) ×2 IMPLANT
SUT PROLENE 4 0 RB 1 (SUTURE) ×2
SUT PROLENE 4 0 TF (SUTURE) ×4 IMPLANT
SUT PROLENE 4-0 RB1 .5 CRCL 36 (SUTURE) ×2 IMPLANT
SUT PROLENE 6 0 CC (SUTURE) ×6 IMPLANT
SUT PROLENE 7 0 BV1 MDA (SUTURE) ×2 IMPLANT
SUT STEEL 6MS V (SUTURE) ×2 IMPLANT
SUT STEEL SZ 6 DBL 3X14 BALL (SUTURE) ×2 IMPLANT
SUT VIC AB 1 CTX 18 (SUTURE) ×4 IMPLANT
SUT VIC AB 2-0 CT1 27 (SUTURE) ×2
SUT VIC AB 2-0 CT1 TAPERPNT 27 (SUTURE) ×2 IMPLANT
SUTURE E-PAK OPEN HEART (SUTURE) ×2 IMPLANT
SYSTEM SAHARA CHEST DRAIN ATS (WOUND CARE) ×2 IMPLANT
TAPE CLOTH SURG 4X10 WHT LF (GAUZE/BANDAGES/DRESSINGS) ×4 IMPLANT
TOWEL OR 17X24 6PK STRL BLUE (TOWEL DISPOSABLE) ×4 IMPLANT
TOWEL OR 17X26 10 PK STRL BLUE (TOWEL DISPOSABLE) ×4 IMPLANT
TRAY FOLEY IC TEMP SENS 16FR (CATHETERS) ×2 IMPLANT
TUBING INSUFFLATION (TUBING) ×2 IMPLANT
UNDERPAD 30X30 INCONTINENT (UNDERPADS AND DIAPERS) ×2 IMPLANT
WATER STERILE IRR 1000ML POUR (IV SOLUTION) ×4 IMPLANT

## 2015-01-02 NOTE — Progress Notes (Signed)
Dr. Ellyn Hack speaking w/patient and family.

## 2015-01-02 NOTE — ED Notes (Addendum)
Preparing to take pt to cath lab. Pt signing consent at this time.

## 2015-01-02 NOTE — H&P (Signed)
Patient ID: Sheri Hernandez MRN: [id number], DOB/AGE: 05/17/1956   Admit date: 01/02/2015   Primary Physician: Odette Fraction, MD Primary Cardiologist: [facility] (Dr. Burt Knack)  Pt. Profile:  59 y/o female with h/o stage 4 metastatic renal cell carcinoma s/p left nephrectomy presenting with chest pain and elevated troponin.   Problem List  Past Medical History  Diagnosis Date  . Papilloma of breast 2012    left. lumpectomy  . Anxiety   . Complication of anesthesia     hard time waking up 20 yrs. ago when having wisdon teeth removed  . Hypertension   . Shortness of breath     WITH EXERTION  . 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  . Lung mass     LUNG MASSES--COUGH-BIOPSY NON-DIAGNOSTIC.  HX OF MEDIASTINAL MASS AND LUNG MASSES AGE 89 -NEGATIVE BIOSPY-BUT GIVEN DIAGNOSIS OF SARCOIDOSIS.  WORK UP CONTINUING ON DIAGNOSIS FOR PT'S LUNG MASSES--SHE HAS BIOPSY PROVEN KIDNEY CANCER  . S/p nephrectomy     left, due to metastatic RCC, 3/14  . Cancer   . Asthma     Past Surgical History  Procedure Laterality Date  . Tonsillectomy  1967  . Bronchial brush biopsy    . Wids    . Wisdom tooth extraction    . Neck lesion biopsy    . Breast biopsy  08/18/2011    Procedure: BREAST BIOPSY WITH NEEDLE LOCALIZATION;  Surgeon: Merrie Roof, MD;  Location: [place];  Service: General;  Laterality: Left;  left breast biopsy with needle localization  . Video bronchoscopy  10/11/2012    Procedure: VIDEO BRONCHOSCOPY WITH FLUORO;  Surgeon: Kathee Delton, MD;  Location: [facility];  Service: Cardiopulmonary;  Laterality: Bilateral;  . Laparoscopic nephrectomy Left 11/22/2012    Procedure: LAPAROSCOPIC NEPHRECTOMY;  Surgeon: Dutch Gray, MD;  Location: [facility];  Service: Urology;  Laterality: Left;       Allergies  No Known Allergies  HPI  The patient is a 59 y/o WF with a history of stage 4 metastatic renal cell carcinoma, first diagnosed in 2014. She is s/p left nephrectomy done at [facility] in 2014. She reports metastasis to her chest. Her PMH is also significant for at 40 year h/o tobacco abuse, quit in 2014, as well as a h/o treated HTN. She denies h/o HLD and DM. Family history is notable for CAD and CHF. Her father died from a massive MI at the age of 84. Her mother had a PPM and CHF.   She reports a recent 3 week history of intermittent exertional chest pressure with associated dyspnea. Walking up inclines in particular produces/exacerbates her pain. She also has some occasional resting pain. Also with radiation to her neck. Denies syncope/ near syncope. She was seen by a cardiologist in private practice yesterday and was set up for a stress test earlier today. She was unable to tolerate the chemical stress test due to symptoms and only completed half of the study. She left that provider's office and came to [facility] for further evaluation. She has requested to be followed by a different practice. [facility] was asked to assess.  In the ED, POC troponin is elevated at 0.16. actual lab troponin is pending. CXR also pending.  EKG shows NSR and is non acute. She continues to have intermittent pain at rest, especially when she gets emotionally upset about her current condition. It resolves fairly quickly. BP is stable in the 785Y systolic. HR in the 90s. Despite a solitary kidney, renal function is normal with SCr at 0.84.    Home Medications  Prior to Admission medications   Medication Sig Start Date End Date Taking? Authorizing Provider  acetaminophen (TYLENOL) 500 MG tablet Take 1,000 mg by mouth every 6 (six) hours as needed (pain).   Yes Historical Provider, MD  amLODipine (NORVASC) 10 MG tablet Take 1 tablet (10 mg total) by mouth daily. Patient taking differently: Take 10 mg  by mouth every other day.  03/25/14  Yes Susy Frizzle, MD  aspirin 81 MG tablet Take 81 mg by mouth daily.   Yes Historical Provider, MD  hydrochlorothiazide (MICROZIDE) 12.5 MG capsule Take one tablet by mouth daily for 3 days, then use one daily only as needed for swelling. 12/31/14  Yes Orlena Sheldon, PA-C  HYDROcodone-acetaminophen (NORCO/VICODIN) 5-325 MG per tablet Take 1 tablet by mouth every 4 (four) hours as needed for moderate pain.   Yes Historical Provider, MD  levothyroxine (SYNTHROID, LEVOTHROID) 137 MCG tablet Take 1 tablet (137 mcg total) by mouth daily before breakfast. 10/29/14  Yes Susy Frizzle, MD  Nivolumab (OPDIVO IV) Inject into the vein every 14 (fourteen) days.    Yes Historical Provider, MD  LORazepam (ATIVAN) 1 MG tablet Take 1 mg by mouth 3 (three) times daily as needed (anxiety).  12/31/14   Historical Provider, MD    Family History  Family History  Problem Relation Age of Onset  . Heart disease Mother   . Heart disease Father     heart attack  . Heart disease Brother   . Stroke Brother   . Cancer Maternal Aunt     colon, breast in different aunts/uncles  . Cancer Maternal Uncle     skin, stomach  . Heart disease Maternal Grandmother   . Cancer Maternal Grandfather     cancer    Social History  History   Social History  . Marital Status: Married    Spouse Name: N/A  . Number of Children: 0  . Years of Education: N/A   Occupational History  . 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    Social History Main Topics  . Smoking status: Former Smoker -- 1.50 packs/day for 40 years    Types: Cigarettes    Quit date: 09/18/2012  . Smokeless tobacco: Never Used     Comment: PT STATES THAT SHE QUIT SMOKING 09/27/11. PATIENT STATES THAT SHE HAS NOT "INHALED" IN LAST 4 YEARS.   Marland Kitchen Alcohol Use: No     Comment: Pt reports "it's rare"  . Drug Use: No  . Sexual Activity: Not on file   Other Topics Concern  . Not on file   Social History Narrative       Review of Systems General:  No chills, fever, night sweats or weight changes.  Cardiovascular:  + chest pain, + dyspnea on exertion, No edema, orthopnea, palpitations, paroxysmal nocturnal dyspnea. Dermatological: No rash, lesions/masses Respiratory: No cough, dyspnea Urologic: No hematuria, dysuria Abdominal:   No nausea, vomiting, diarrhea, bright red blood per rectum, melena, or hematemesis Neurologic:  No visual changes, wkns, changes in mental status. All other systems reviewed and are otherwise negative except as noted above.  Physical Exam  Blood  pressure 138/81, pulse 94, temperature 97.9 F (36.6 C), temperature source Oral, resp. rate 15, height '5\' 8"'$  (1.727 m), weight 180 lb (81.647 kg), SpO2 99 %.  General: Pleasant, NAD Psych: Normal affect. Neuro: Alert and oriented X 3. Moves all extremities spontaneously. HEENT: Normal  Neck: Supple without bruits or JVD. Lungs:  Resp regular and unlabored, CTA. Heart: RRR no s3, s4, or murmurs. Abdomen: Soft, non-tender, non-distended, BS + x 4.  Extremities: No clubbing, cyanosis or edema. DP/PT/Radials 2+ and equal bilaterally.  Labs  Troponin [facility] Test)  Recent Labs  01/02/15 1306  TROPIPOC 0.16*   No results for input(s): CKTOTAL, CKMB, TROPONINI in the last 72 hours. Lab Results  Component Value Date   WBC 8.1 01/02/2015   HGB 11.7* 01/02/2015   HCT 36.0 01/02/2015   MCV 79.6 01/02/2015   PLT 226 01/02/2015    Recent Labs Lab 01/02/15 1259  NA 139  K 3.8  CL 104  CO2 26  BUN 17  CREATININE 0.84  CALCIUM 10.0  GLUCOSE 120*   No results found for: CHOL, HDL, LDLCALC, TRIG No results found for: DDIMER   Radiology/Studies  No results found.  ECG  NSR. No ischemic abnormalities   ASSESSMENT AND PLAN  1. USA/NSTEMI: recent exertional/resting chest pain concerning for ACS. Although EKG is nonacute, POC troponin is abnormal at 0.16. Actual lab troponin's are pending. Given risk factor  and symptomatology, will admit for ischemic evaluation. Will continue to cycle cardiac enzymes x 3. Check 2D echo and plan for likely LHC +/- PCI. Add ASA, high dose statin and BB. IV heparin. PRN SL NTG. Will check FLP and LFTs in the am along with Hgb A1c for risk factor screening.   2. Solitary Kidney: s/p left nephrectomy due to renal cell carcinoma. Renal function is normal with Scr at 0.84. Will need to monitor renal function closely due to contrast dye exposure for needed LHC.   3. HTN: controlled in the 016W systolic. Hold HCTZ for cath. Add BB. 12.5 mg of Lopressor BID.   Signed, Lyda Jester, PA-C 01/02/2015, 2:42 PM  Patient seen, examined. Available data reviewed. Agree with findings, assessment, and plan as outlined by Lyda Jester, PA-C. The patient is independently interviewed and examined. She is 59 years old with metastatic renal cell carcinoma. I have reviewed notes from [facility] regarding treatment of her renal cell carcinoma which has metastasized to her lungs. She was recently seen in the hematology and oncology clinic on 12/11/2014. Based on office notes, she has had significant improvement with chemotherapy. There has been decrease in size of multiple nodules.  Unfortunately, over the last 2 weeks, she notes progressive symptoms of chest discomfort now occurring with minimal activity. She has had nocturnal angina the last 2 nights. Her initial troponin is mildly elevated. Her symptoms are highly typical of crescendo angina/acute coronary syndrome. Exam reveals an alert, oriented, anxious woman in no distress. Lungs are clear. R is regular rate and rhythm without murmur. Abdomen is soft nontender. There is no peripheral edema.   EKG shows no acute changes. Initial troponin is 0.16.  Cardiac catheterization and possible PCI is clearly indicated here. The patient has crescendo angina at risk for myocardial infarction.  I have reviewed the  risks, indications, and alternatives to cardiac catheterization with angioplasty and stenting with the patient. Risks include but are not limited to bleeding, infection, vascular injury, stroke, myocardial infection, arrhythmia, kidney injury, radiation-related injury in the  case of prolonged fluoroscopy use, emergency cardiac surgery, and death. The patient understands the risks of serious complication is low (<5%). She agrees to proceed.   Sherren Mocha, M.D. 01/02/2015 4:08 PM

## 2015-01-02 NOTE — ED Provider Notes (Signed)
CSN: [id number]     Arrival date & time 01/02/15  1117 History   First MD Initiated Contact with Patient 01/02/15 1144     Chief Complaint  Patient presents with  . Chest Pain     (Consider location/radiation/quality/duration/timing/severity/associated sxs/prior Treatment) HPI   Sheri Hernandez is a 59 y.o. female who is here for evaluation of an abnormal response to a cardiac stress test. Patient was being evaluated for chest pain, today, with a nuclear stress test. About 1 hour after placement of the nuclear tracer, she was put into a room and began to feel uncomfortable. She had anxiousness, felt funny, and diaphoresis. She felt that she was being strangled. Not have hypotension or preceding chest pain. After onset of symptoms. She did develop some chest pain. Her chest pain was 10 over 10 at the worst. During transport, she received nitroglycerin, 1 with improvement of her pain to 3/10. Treated with aspirin. She's been having bouts of chest pain, for 3 weeks. There are associated mostly with bending over. She does not have any associated shortness of breath or dyspnea on exertion. She's never had cardiac problems in the past. She is being actively treated for metastatic renal cancer. She received an infusion of chemotherapy, 2 weeks ago that seemed to exacerbate her chest discomfort. She is taking her usual medications. She was recently prescribed lorazepam for anxiety, but has not started it yet. She was also treated with a PPI, which she took for 2 weeks, without relief of the discomfort of her chest. She has since stopped that. She presents here by EMS. There are no other known modifying factors.   Past Medical History  Diagnosis Date  . Papilloma of breast 2012    left. lumpectomy  . Anxiety   . Complication of anesthesia     hard time waking up 20 yrs. ago when having wisdon teeth removed  . Hypertension   . Shortness of breath     WITH EXERTION  . Pain     HX OF EPISODES OF  BREIF PAIN BACK OF HEAD-RIGHT SIDE- OCCURS WHEN PT TURNS HER HEAD TO RIGHT--BUT DOESN'T HAPPEN EVERY TIME SHE TURNS HER HEAD TO RIGHT--SHE HAS HAD ALL HER LIFE AND STATES PAST WORK UPS- NEGATIVE FOR ANY BRAIN ISSUES.  . Lung mass     LUNG MASSES--COUGH-BIOPSY NON-DIAGNOSTIC.  HX OF MEDIASTINAL MASS AND LUNG MASSES AGE 41 -NEGATIVE BIOSPY-BUT GIVEN DIAGNOSIS OF SARCOIDOSIS.  WORK UP CONTINUING ON DIAGNOSIS FOR PT'S LUNG MASSES--SHE HAS BIOPSY PROVEN KIDNEY CANCER  . S/p nephrectomy     left, due to metastatic RCC, 3/14  . Cancer   . Asthma    Past Surgical History  Procedure Laterality Date  . Tonsillectomy  1967  . Bronchial brush biopsy    . Wids    . Wisdom tooth extraction    . Neck lesion biopsy    . Breast biopsy  08/18/2011    Procedure: BREAST BIOPSY WITH NEEDLE LOCALIZATION;  Surgeon: Merrie Roof, MD;  Location: [facility];  Service: General;  Laterality: Left;  left breast biopsy with needle localization  . Video bronchoscopy  10/11/2012    Procedure: VIDEO BRONCHOSCOPY WITH FLUORO;  Surgeon: Kathee Delton, MD;  Location: [facility];  Service: Cardiopulmonary;  Laterality: Bilateral;  . Laparoscopic nephrectomy Left 11/22/2012    Procedure: LAPAROSCOPIC NEPHRECTOMY;  Surgeon: Dutch Gray, MD;  Location: [facility];  Service: Urology;  Laterality: Left;   Family History  Problem Relation Age of  Onset  . Heart disease Mother   . Heart disease Father     heart attack  . Heart disease Brother   . Stroke Brother   . Cancer Maternal Aunt     colon, breast in different aunts/uncles  . Cancer Maternal Uncle     skin, stomach  . Heart disease Maternal Grandmother   . Cancer Maternal Grandfather     cancer   History  Substance Use Topics  . Smoking status: Former Smoker -- 1.50 packs/day for 40 years    Types: Cigarettes    Quit date: 09/18/2012  . Smokeless tobacco: Never Used     Comment: PT STATES THAT SHE QUIT SMOKING 09/27/11. PATIENT STATES THAT SHE HAS NOT "INHALED" IN LAST  4 YEARS.   Marland Kitchen Alcohol Use: No     Comment: Pt reports "it's rare"   OB History    Gravida Para Term Preterm AB TAB SAB Ectopic Multiple Living   '0 0 0 0 0 0 0 0 0 0 '$     Review of Systems  All other systems reviewed and are negative.     Allergies  Review of patient's allergies indicates no known allergies.  Home Medications   Prior to Admission medications   Medication Sig Start Date End Date Taking? Authorizing Provider  LORazepam (ATIVAN) 1 MG tablet Take 1 mg by mouth 3 (three) times daily as needed. 12/31/14  Yes Historical Provider, MD  amLODipine (NORVASC) 10 MG tablet Take 1 tablet (10 mg total) by mouth daily. Patient taking differently: Take 10 mg by mouth every other day.  03/25/14   Susy Frizzle, MD  aspirin 81 MG tablet Take 81 mg by mouth daily.    Historical Provider, MD  hydrochlorothiazide (MICROZIDE) 12.5 MG capsule Take one tablet by mouth daily for 3 days, then use one daily only as needed for swelling. 12/31/14   Orlena Sheldon, PA-C  HYDROcodone-acetaminophen (NORCO/VICODIN) 5-325 MG per tablet Take 1 tablet by mouth every 4 (four) hours as needed for moderate pain.    Historical Provider, MD  levothyroxine (SYNTHROID, LEVOTHROID) 137 MCG tablet Take 1 tablet (137 mcg total) by mouth daily before breakfast. 10/29/14   Susy Frizzle, MD  Nivolumab (OPDIVO IV) Inject into the vein every 21 ( twenty-one) days.    Historical Provider, MD   BP 142/77 mmHg  Pulse 97  Temp(Src) 97.9 F (36.6 C) (Oral)  Resp 16  Ht '5\' 8"'$  (1.727 m)  Wt 180 lb (81.647 kg)  BMI 27.38 kg/m2  SpO2 99% Physical Exam  Constitutional: She is oriented to person, place, and time. She appears well-developed and well-nourished. She appears distressed (she is uncomfortable).  HENT:  Head: Normocephalic and atraumatic.  Right Ear: External ear normal.  Left Ear: External ear normal.  Eyes: Conjunctivae and EOM are normal. Pupils are equal, round, and reactive to light.  Neck: Normal  range of motion and phonation normal. Neck supple.  Cardiovascular: Normal rate, regular rhythm and normal heart sounds.   Pulmonary/Chest: Effort normal and breath sounds normal. No respiratory distress. She has no rales. She exhibits no tenderness and no bony tenderness.  Abdominal: Soft. There is no tenderness.  Musculoskeletal: Normal range of motion.  Neurological: She is alert and oriented to person, place, and time. No cranial nerve deficit or sensory deficit. She exhibits normal muscle tone. Coordination normal.  Skin: Skin is warm, dry and intact.  Psychiatric: Her behavior is normal. Judgment and thought content normal.  Anxious  and tearful during exam  Nursing note and vitals reviewed.   ED Course  Procedures (including critical care time) I discussed case with Dr. Nadyne Coombes, who was with her at the time of the nuclear stress test, this morning. He states that it was not clear what type of reaction she was having.   Medications - No data to display  Patient Vitals for the past 24 hrs:  BP Temp Temp src Pulse Resp SpO2 Height Weight  01/02/15 1202 142/77 mmHg - - 97 16 99 % - -  01/02/15 1134 - - - - - - '5\' 8"'$  (1.727 m) 180 lb (81.647 kg)  01/02/15 1134 125/73 mmHg 97.9 F (36.6 C) Oral 99 16 93 % - -  01/02/15 1130 125/73 mmHg - - - 12 - - -  01/02/15 1128 - - - - - 99 % - -    1:42 PM Reevaluation with update and discussion. After initial assessment and treatment, an updated evaluation reveals she had spontaneous improvement of her chest discomfort which was present when I first saw her. After that, she went to the bathroom and had recurrence of the chest discomfort with a sensation of being strangled. This has occurred twice, and has resolved, twice. The chest discomfort returned. When she is bending over to take her pants off to urinate, each time. At this time. The patient is alert, calm, cooperative and comfortable. She states that when Dr. Nadyne Coombes talked to her yesterday about  her chest discomfort. She became very worried. She felt that he should have done some testing, then. She would like her care transferred to a different cardiologist.Safir Michalec L   13:45- . I updated Dr. Nadyne Coombes on the patient's request.  1:52 PM-Consult complete with [facility], patient case explained and discussed. She agrees to admit patient for further evaluation and treatment. Call ended at 14:15  CRITICAL CARE Performed by: Richarda Blade Total critical care time: 35 minutes Critical care time was exclusive of separately billable procedures and treating other patients. Critical care was necessary to treat or prevent imminent or life-threatening deterioration. Critical care was time spent personally by me on the following activities: development of treatment plan with patient and/or surrogate as well as nursing, discussions with consultants, evaluation of patient's response to treatment, examination of patient, obtaining history from patient or surrogate, ordering and performing treatments and interventions, ordering and review of laboratory studies, ordering and review of radiographic studies, pulse oximetry and re-evaluation of patient's condition.   Labs Review Labs Reviewed  CBC - Abnormal; Notable for the following:    Hemoglobin 11.7 (*)    MCH 25.9 (*)    RDW 18.4 (*)    All other components within normal limits  BASIC METABOLIC PANEL - Abnormal; Notable for the following:    Glucose, Bld 120 (*)    GFR calc non Af Amer 76 (*)    GFR calc Af Amer 88 (*)    All other components within normal limits  I-STAT TROPOININ, ED - Abnormal; Notable for the following:    Troponin i, poc 0.16 (*)    All other components within normal limits    Imaging Review No results found.   EKG Interpretation   Date/Time:  Friday January 02 2015 11:26:03 EDT Ventricular Rate:  99 PR Interval:  173 QRS Duration: 86 QT Interval:  377 QTC Calculation: 484 R Axis:   15 Text  Interpretation:  Sinus rhythm Low voltage, precordial leads  Anteroseptal infarct, old since last tracing  no significant change  Confirmed by [facility]  MD, Merl Guardino [id number]) on 01/02/2015 11:30:09 AM       EKG Interpretation  Date/Time:  Friday January 02 2015 14:25:17 EDT Ventricular Rate:  94 PR Interval:  166 QRS Duration: 77 QT Interval:  349 QTC Calculation: 436 R Axis:   21 Text Interpretation:  Sinus rhythm Anterior infarct, old Since last tracing of earlier today No significant change was found Confirmed by Eulis Foster  MD, Spyridon Hornstein [phone]) on 01/02/2015 2:42:53 PM        MDM   Final diagnoses:  NSTEMI (non-ST elevated myocardial infarction)    Nonspecific chest discomfort and reaction to cardiac nuclear stress testing. Troponin is elevated. Patient will require further testing and evaluation in the observed setting of hospitalization.  Nursing Notes Reviewed/ Care Coordinated, and agree without changes. Applicable Imaging Reviewed.  Interpretation of Laboratory Data incorporated into ED treatment  Plan: Admit    Daleen Bo, MD 01/02/15 1547

## 2015-01-02 NOTE — CV Procedure (Signed)
Cardiac Catheterization Procedure Note  Name: Sheri Hernandez MRN: [id number] DOB: [birth year]  Procedure: Left Heart Cath, Selective Coronary Angiography, LV angiography,  attempted unsuccessful PCI of the right coronary artery with acute vessel closure.  Indication: Non-ST elevation myocardial infarction.  Medications:  Sedation:  3 mg IV Versed, 175 mcg IV Fentanyl, 3 mg of Dilaudid  Contrast:  195 mL Omnipaque  Procedural Details: The right wrist was prepped, draped, and anesthetized with 1% lidocaine. Using the modified Seldinger technique, a 5 French Slender sheath was introduced into the right radial artery. 3 mg of verapamil was administered through the sheath, weight-based unfractionated heparin was administered intravenously. A Jackie catheter was used for selective coronary angiography. A JL 3.5 was used to engage the left main coronary artery A pigtail catheter was used for left ventriculography. Catheter exchanges were performed over an exchange length guidewire.   Procedural Findings:  Hemodynamics: AO:  162/93   mmHg LV:  160/10    mmHg LVEDP: 33  mmHg  Coronary angiography: Coronary dominance: Right   Left Main:  Normal  Left Anterior Descending (LAD):  Moderately calcified and normal in size. There is diffuse 20% disease proximally. There is diffuse 40% disease in the midsegment.  1st diagonal (D1):  Small in size with minor irregularities.  2nd diagonal (D2):  Large in size with 20% ostial stenosis.  3rd diagonal (D3):  Very small in size.  Circumflex (LCx):  Normal in size and nondominant. The vessel is moderately calcified. There is a 95% proximal stenosis and 80% mid stenosis.  1st obtuse marginal:  Small in size with minor irregularities.  2nd obtuse marginal:  Medium in size with minor irregularities.  3rd obtuse marginal:  Medium in size with minor irregularities.    Right Coronary Artery: Large in size and dominant. The vessel is heavily  calcified in the midsegment. It is aneurysmal in the mid and distal segment. There is 95% proximal stenosis followed by diffuse 80% mid stenosis. There is an 80% stenosis distally between 2 aneurysmal segments.  Posterior descending artery: Normal in size with 95% proximal stenosis.  Posterior AV segment: Normal  Posterolateral branchs:  PL 1 is small in size. PL 2 is very large in size with diffuse 50% proximal stenosis.  Left ventriculography: Left ventricular systolic function is (normal , LVEF is estimated at 55 %, there is mild mitral regurgitation   PCI Note:  This was a difficult decision overall. The right coronary artery is aneurysmal almost throughout the mid and distal segment. It is moderately to heavily calcified and tortuous proximally with multiple areas of stenosis. I discussed the case with Dr. Burt Knack and we felt that the best option might be to intervene on the proximal right coronary artery.  Weight-based bivalirudin was given for anticoagulation. Once a therapeutic ACT was achieved, a 6 [place] JR4 guide catheter was inserted.  Brilinta 180 mg was given orally. I then attempted to cross the stenosis proximally and used multiple wires including the intuition wire, run through wire, whisper, Ramond Marrow and Miracle brother 6.   I spent more than one hour attempted to cross the stenosis and successfully. At this point, after multiple attempts there was TIMI 1 flow in the vessel. Dr. Burt Knack then attempted to cross the stenosis with a long wire over an over-the-wire balloon was again was unsuccessful in spite of spending at least 45 minutes. This was overall a difficult situation. I consulted cardiothoracic surgery with Dr. Pia Mau after realizing that the stenosis  was not crossable. We had a prolonged discussion with the patient and her husband about the advantages and disadvantages of proceeding with emergent CABG. Obviously there was a concern about her metastatic renal carcinoma, poor  functional capacity and receiving antiplatelet therapy. Dr. Pia Mau discussed the case with the patient's oncologist who reported recent response to immunotherapy. After extensive discussion, we decided that the best option is emergent CABG although the outcome was not guaranteed and the patient will likely have prolonged postoperative course. Please note that the case was extremely difficult due to difficulty in sedating the patient and controlling her pain even during diagnostic catheterization. The patient was taken to the OR  From the cath lab. The husband was updated throughout the case.     PCI Data: Vessel - proximal right coronary artery Perent Stenosis (pre)   95%  TIMI-flow  3  Percent Stenosis (post)  99%  TIMI-flow (post)  1    Final Conclusions:   1. Severe two-vessel coronary artery disease 2. Normal LV systolic function with moderately to severely elevated left ventricular end-diastolic pressure 3. Unsuccessful RCA PCI with acute vessel closure.  Recommendations:   The patient went to the OR for emergent CABG.   Kathlyn Sacramento MD, [place] 01/02/2015, 9:34 PM

## 2015-01-02 NOTE — Progress Notes (Signed)
Echocardiogram Echocardiogram Transesophageal has been performed.  Sheri Hernandez 01/02/2015, 11:03 PM

## 2015-01-02 NOTE — ED Notes (Signed)
Pt. Came from [facility] Dr. Gabriel Carina appointment. Pt. Had chemical stress test today, during test began experiencing some lightheadness, CP, diaphoresis, and tinglings to L arm and hand. Pt. Complaint of intermittent CP x 2 weeks. Pt. Has hx of stage 4 kidney cancer, L kidney removed, receiving immune therapy txt. Mets to "chest." EMS reports EKG unremarkable. Pt. Given 324 ASA and 1 Nitro that took pain from 10-3.

## 2015-01-02 NOTE — Anesthesia Procedure Notes (Signed)
Procedure Name: Intubation Performed by: Gean Maidens Pre-anesthesia Checklist: Patient identified, Emergency Drugs available, Suction available, Timeout performed and Patient being monitored Patient Re-evaluated:Patient Re-evaluated prior to inductionOxygen Delivery Method: Circle system utilized Preoxygenation: Pre-oxygenation with 100% oxygen Intubation Type: IV induction Ventilation: Mask ventilation without difficulty Laryngoscope Size: Mac and 3 Grade View: Grade II Tube type: Oral Tube size: 7.5 mm Number of attempts: 1 Placement Confirmation: ETT inserted through vocal cords under direct vision,  breath sounds checked- equal and bilateral,  positive ETCO2 and CO2 detector Secured at: 21 cm Tube secured with: Tape

## 2015-01-02 NOTE — Anesthesia Preprocedure Evaluation (Addendum)
Anesthesia Evaluation  Patient identified by MRN, date of birth, ID band Patient awake    Reviewed: Allergy & Precautions, NPO status , Patient's Chart, lab work & pertinent test results, Unable to perform ROS - Chart review onlyPreop documentation limited or incomplete due to emergent nature of procedure.  History of Anesthesia Complications Negative for: history of anesthetic complications  Airway Mallampati: II  TM Distance: >3 FB Neck ROM: Full    Dental  (+) Poor Dentition, Dental Advisory Given   Pulmonary shortness of breath and at rest, asthma , COPDformer smoker (quit 2014),  breath sounds clear to auscultation        Cardiovascular hypertension, Pt. on medications + angina + CAD and + Past MI (acute inferior MI) Rhythm:Irregular Rate:Tachycardia     Neuro/Psych negative neurological ROS     GI/Hepatic negative GI ROS, Neg liver ROS,   Endo/Other  Hypothyroidism   Renal/GU S/p nephrectomy for Renal cell carcinoma: mets to chest and retroperitoneum     Musculoskeletal   Abdominal   Peds  Hematology negative hematology ROS (+)   Anesthesia Other Findings   Reproductive/Obstetrics                           Anesthesia Physical Anesthesia Plan  ASA: IV and emergent  Anesthesia Plan: General   Post-op Pain Management:    Induction: Intravenous and Rapid sequence  Airway Management Planned: Oral ETT  Additional Equipment: Arterial line, CVP, PA Cath, TEE and Ultrasound Guidance Line Placement  Intra-op Plan:   Post-operative Plan: Post-operative intubation/ventilation  Informed Consent: I have reviewed the patients History and Physical, chart, labs and discussed the procedure including the risks, benefits and alternatives for the proposed anesthesia with the patient or authorized representative who has indicated his/her understanding and acceptance.   Dental advisory given and  Only emergency history available  Plan Discussed with: CRNA and Surgeon  Anesthesia Plan Comments: (Plan routine monitors, A line, PA cath, GETA with TEE and post op ventilation)        Anesthesia Quick Evaluation

## 2015-01-02 NOTE — Progress Notes (Signed)
ANTICOAGULATION CONSULT NOTE - Initial Consult  Pharmacy Consult for Heparin Indication: chest pain/ACS  No Known Allergies  Patient Measurements: Height: '5\' 8"'$  (172.7 cm) Weight: 180 lb (81.647 kg) IBW/kg (Calculated) : 63.9 Heparin Dosing Weight: 80 kg  Vital Signs: Temp: 97.9 F (36.6 C) (04/29 1134) Temp Source: Oral (04/29 1134) BP: 145/81 mmHg (04/29 1650) Pulse Rate: 106 (04/29 1650)  Labs:  Recent Labs  01/02/15 1259  HGB 11.7*  HCT 36.0  PLT 226  CREATININE 0.84    Estimated Creatinine Clearance: 83.8 mL/min (by C-G formula based on Cr of 0.84).   Medical History: Past Medical History  Diagnosis Date  . Papilloma of breast 2012    left. lumpectomy  . Anxiety   . Complication of anesthesia     hard time waking up 20 yrs. ago when having wisdon teeth removed  . Hypertension   . Shortness of breath     WITH EXERTION  . 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  . Lung mass     LUNG MASSES--COUGH-BIOPSY NON-DIAGNOSTIC.  HX OF MEDIASTINAL MASS AND LUNG MASSES AGE 85 -NEGATIVE BIOSPY-BUT GIVEN DIAGNOSIS OF SARCOIDOSIS.  WORK UP CONTINUING ON DIAGNOSIS FOR PT'S LUNG MASSES--SHE HAS BIOPSY PROVEN KIDNEY CANCER  . S/p nephrectomy     left, due to metastatic RCC, 3/14  . Cancer   . Asthma     Medications:   (Not in a hospital admission) Scheduled:  Infusions:   Assessment: 59yo female with metastatic RCC s/p L nephrectomy presents with CP and elevated troponin. Pharmacy is consulted to dose heparin for ACS/chest pain. Hgb 11.7, Plt 226, sCr 0.8, Trop 0.16.  Goal of Therapy:  Heparin level 0.3-0.7 units/ml Monitor platelets by anticoagulation protocol: Yes   Plan:  Give 4000 units bolus x 1 Start heparin infusion at 1000 units/hr Check anti-Xa level  in 6 hours and daily while on heparin Continue to monitor H&H and platelets  Andrey Cota. Diona Foley, PharmD Clinical Pharmacist Pager [phone] 01/02/2015,5:25 PM

## 2015-01-02 NOTE — Progress Notes (Signed)
Patient ID: Sheri Hernandez, female   DOB: Jun 22, 1956, 59 y.o.   MRN: [id number]      [address]       [place] [address]             [phone]        Diamon C Ireland [facility] Medical Record #[id number] Date of Birth: [birth year]  Referring: Dr Lovette Cliche Primary Care: Odette Fraction, MD  Chief Complaint:    Chief Complaint  Patient presents with  . Chest Pain    History of Present Illness:     Called urgently to cath lab to see patient with severe chest pain and EKG changes of inferior MI. Multiple attempts to get wire across the right coronary unsuccessful.     Patient has stage IV renal cell cancer. Discussed case with Dr Marcello Moores oncologist [facility] , patient has seen recent regression of disease on Nevibnib x 10 doses. With response seen he was encouraged short term to treat cad aggressively.   Current Activity/ Functional Status: Patient is independent with mobility/ambulation, transfers, ADL's, IADL's.   Zubrod Score: At the time of surgery this patient's most appropriate activity status/level should be described as: '[]'$     0    Normal activity, no symptoms '[]'$     1    Restricted in physical strenuous activity but ambulatory, able to do out light work '[]'$     2    Ambulatory and capable of self care, unable to do work activities, up and about                 more than 50%  Of the time                            '[x]'$     3    Only limited self care, in bed greater than 50% of waking hours '[]'$     4    Completely disabled, no self care, confined to bed or chair '[]'$     5    Moribund  Past Medical History  Diagnosis Date  . Papilloma of breast 2012    left. lumpectomy  . Anxiety   . Complication of anesthesia     hard time waking up 20 yrs. ago when having wisdon teeth removed  . Hypertension   . Shortness of breath     WITH EXERTION  . Pain     HX OF EPISODES OF BREIF PAIN BACK OF HEAD-RIGHT SIDE- OCCURS WHEN PT TURNS HER HEAD TO RIGHT--BUT DOESN'T HAPPEN  EVERY TIME SHE TURNS HER HEAD TO RIGHT--SHE HAS HAD ALL HER LIFE AND STATES PAST WORK UPS- NEGATIVE FOR ANY BRAIN ISSUES.  . Lung mass     LUNG MASSES--COUGH-BIOPSY NON-DIAGNOSTIC.  HX OF MEDIASTINAL MASS AND LUNG MASSES AGE 66 -NEGATIVE BIOSPY-BUT GIVEN DIAGNOSIS OF SARCOIDOSIS.  WORK UP CONTINUING ON DIAGNOSIS FOR PT'S LUNG MASSES--SHE HAS BIOPSY PROVEN KIDNEY CANCER  . S/p nephrectomy     left, due to metastatic RCC, 3/14  . Cancer   . Asthma     Past Surgical History  Procedure Laterality Date  . Tonsillectomy  1967  . Bronchial brush biopsy    . Wids    . Wisdom tooth extraction    . Neck lesion biopsy    . Breast biopsy  08/18/2011    Procedure: BREAST BIOPSY WITH NEEDLE LOCALIZATION;  Surgeon: Merrie Roof, MD;  Location: [facility];  Service:  General;  Laterality: Left;  left breast biopsy with needle localization  . Video bronchoscopy  10/11/2012    Procedure: VIDEO BRONCHOSCOPY WITH FLUORO;  Surgeon: Kathee Delton, MD;  Location: [facility];  Service: Cardiopulmonary;  Laterality: Bilateral;  . Laparoscopic nephrectomy Left 11/22/2012    Procedure: LAPAROSCOPIC NEPHRECTOMY;  Surgeon: Dutch Gray, MD;  Location: [facility];  Service: Urology;  Laterality: Left;    History  Smoking status  . Former Smoker -- 1.50 packs/day for 40 years  . Types: Cigarettes  . Quit date: 09/18/2012  Smokeless tobacco  . Never Used    Comment: PT STATES THAT SHE QUIT SMOKING 09/27/11. PATIENT STATES THAT SHE HAS NOT "INHALED" IN LAST 4 YEARS.     History  Alcohol Use No    Comment: Pt reports "it's rare"    History   Social History  . Marital Status: Married    Spouse Name: N/A  . Number of Children: 0  . Years of Education: N/A   Occupational History  . 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    Social History Main Topics  . Smoking status: Former Smoker -- 1.50 packs/day for 40 years    Types: Cigarettes    Quit date: 09/18/2012  . Smokeless tobacco: Never Used     Comment:  PT STATES THAT SHE QUIT SMOKING 09/27/11. PATIENT STATES THAT SHE HAS NOT "INHALED" IN LAST 4 YEARS.   Marland Kitchen Alcohol Use: No     Comment: Pt reports "it's rare"  . Drug Use: No  . Sexual Activity: Not on file   Other Topics Concern  . Not on file   Social History Narrative    No Known Allergies  Current Facility-Administered Medications  Medication Dose Route Frequency Provider Last Rate Last Dose  . aminocaproic acid (AMICAR) 10 g in sodium chloride 0.9 % 100 mL infusion   Intravenous To OR Sherren Mocha, MD      . bivalirudin (ANGIOMAX) 250 mg in sodium chloride 0.9 % 50 mL (5 mg/mL) infusion  1.75 mg/kg/hr Intravenous Continuous Sherren Mocha, MD      . cefUROXime (ZINACEF) 1.5 g in dextrose 5 % 50 mL IVPB  1.5 g Intravenous To OR Sherren Mocha, MD      . cefUROXime (ZINACEF) 750 mg in dextrose 5 % 50 mL IVPB  750 mg Intravenous To OR Sherren Mocha, MD      . dexmedetomidine (PRECEDEX) 400 MCG/100ML (4 mcg/mL) infusion  0.1-0.7 mcg/kg/hr Intravenous To OR Sherren Mocha, MD      . DOPamine (INTROPIN) 800 mg in dextrose 5 % 250 mL (3.2 mg/mL) infusion  0-10 mcg/kg/min Intravenous To OR Sherren Mocha, MD      . EPINEPHrine (ADRENALIN) 4 mg in dextrose 5 % 250 mL (0.016 mg/mL) infusion  0-10 mcg/min Intravenous To OR Sherren Mocha, MD      . heparin 2,500 Units, papaverine 30 mg in electrolyte-148 (PLASMALYTE-148) 500 mL irrigation   Irrigation To OR Sherren Mocha, MD      . heparin 30,000 units/NS 1000 mL solution for CELLSAVER   Other To OR Sherren Mocha, MD      . heparin ADULT infusion 100 units/mL (25000 units/250 mL)  1,000 Units/hr Intravenous Continuous Rebecka Apley, [facility]      . insulin regular (NOVOLIN R,HUMULIN R) 250 Units in sodium chloride 0.9 % 250 mL (1 Units/mL) infusion   Intravenous To OR Sherren Mocha, MD      . magnesium sulfate (IV  Push/IM) injection 40 mEq  40 mEq Other To OR Sherren Mocha, MD      . nitroGLYCERIN 50 mg in dextrose 5 % 250 mL (0.2 mg/mL) infusion   2-200 mcg/min Intravenous To OR Sherren Mocha, MD      . phenylephrine (NEO-SYNEPHRINE) 20 mg in dextrose 5 % 250 mL (0.08 mg/mL) infusion  30-200 mcg/min Intravenous To OR Sherren Mocha, MD      . potassium chloride injection 80 mEq  80 mEq Other To OR Sherren Mocha, MD      . vancomycin (VANCOCIN) 1,250 mg in sodium chloride 0.9 % 250 mL IVPB  1,250 mg Intravenous To OR Sherren Mocha, MD        Prescriptions prior to admission  Medication Sig Dispense Refill Last Dose  . acetaminophen (TYLENOL) 500 MG tablet Take 1,000 mg by mouth every 6 (six) hours as needed (pain).   01/01/2015 at Unknown time  . amLODipine (NORVASC) 10 MG tablet Take 1 tablet (10 mg total) by mouth daily. (Patient taking differently: Take 10 mg by mouth every other day. ) 90 tablet 3 01/01/2015 at Unknown time  . aspirin 81 MG tablet Take 81 mg by mouth daily.   01/02/2015 at Unknown time  . hydrochlorothiazide (MICROZIDE) 12.5 MG capsule Take one tablet by mouth daily for 3 days, then use one daily only as needed for swelling. 30 capsule 0 01/01/2015 at Unknown time  . HYDROcodone-acetaminophen (NORCO/VICODIN) 5-325 MG per tablet Take 1 tablet by mouth every 4 (four) hours as needed for moderate pain.   01/01/2015 at Unknown time  . levothyroxine (SYNTHROID, LEVOTHROID) 137 MCG tablet Take 1 tablet (137 mcg total) by mouth daily before breakfast. 30 tablet 5 01/02/2015 at Unknown time  . Nivolumab (OPDIVO IV) Inject into the vein every 14 (fourteen) days.    12/25/2014  . LORazepam (ATIVAN) 1 MG tablet Take 1 mg by mouth 3 (three) times daily as needed (anxiety).    not taking yet    Family History  Problem Relation Age of Onset  . Heart disease Mother   . Heart disease Father     heart attack  . Heart disease Brother   . Stroke Brother   . Cancer Maternal Aunt     colon, breast in different aunts/uncles  . Cancer Maternal Uncle     skin, stomach  . Heart disease Maternal Grandmother   . Cancer Maternal Grandfather      cancer     Review of Systems:      Cardiac Review of Systems: Y or N  Chest Pain [ y   ]  Resting SOB [ y  ] Exertional SOB  Blue.Reese  ]  Orthopnea [ y ]   Pedal Edema [n   ]    Palpitations [ n ] Syncope  [ n ]   Presyncope [   ]  General Review of Systems: [Y] = yes [  ]=no Constitional: recent weight change [  ]; anorexia [  ]; fatigue [  ]; nausea [  ]; night sweats [  ]; fever [  ]; or chills [  ]  Dental: poor dentition[  ]; Last Dentist visit:   Eye : blurred vision [  ]; diplopia [   ]; vision changes [  ];  Amaurosis fugax[  ]; Resp: cough [  ];  wheezing[  ];  hemoptysis[  ]; shortness of breath[  ]; paroxysmal nocturnal dyspnea[  ]; dyspnea on exertion[  ]; or orthopnea[  ];  GI:  gallstones[  ], vomiting[  ];  dysphagia[  ]; melena[  ];  hematochezia [  ]; heartburn[  ];   Hx of  Colonoscopy[  ]; GU: kidney stones [  ]; hematuria[  ];   dysuria [  ];  nocturia[  ];  history of     obstruction [  ]; urinary frequency [  ]             Skin: rash, swelling[  ];, hair loss[  ];  peripheral edema[  ];  or itching[  ]; Musculosketetal: myalgias[  ];  joint swelling[  ];  joint erythema[  ];  joint pain[  ];  back pain[  ];  Heme/Lymph: bruising[  ];  bleeding[  ];  anemia[  ];  Neuro: TIA[  ];  headaches[  ];  stroke[  ];  vertigo[  ];  seizures[  ];   paresthesias[  ];  difficulty walking[  ];  Psych:depression[  ]; anxiety[  ];  Endocrine: diabetes[  ];  thyroid dysfunction[  ];  Immunizations: Flu [  ]; Pneumococcal[  ];  Other:  Physical Exam: BP 157/74 mmHg  Pulse 105  Temp(Src) 97.9 F (36.6 C) (Oral)  Resp 15  Ht '5\' 8"'$  (1.727 m)  Wt 180 lb (81.647 kg)  BMI 27.38 kg/m2  SpO2 100%   General appearance: appears older than stated age, combative, delirious, distracted, flushed and severe distress Head: Normocephalic, without obvious abnormality, atraumatic Neck: no adenopathy, no carotid bruit, no JVD,  supple, symmetrical, trachea midline and thyroid not enlarged, symmetric, no tenderness/mass/nodules Lymph nodes: Cervical, supraclavicular, and axillary nodes normal. Resp: diminished breath sounds bilaterally Back: negative, symmetric, no curvature. ROM normal. No CVA tenderness. Cardio: regular rate and rhythm, S1, S2 normal, no murmur, click, rub or gallop GI: soft, non-tender; bowel sounds normal; no masses,  no organomegaly Extremities: extremities normal, atraumatic, no cyanosis or edema and Homans sign is negative, no sign of DVT Neurologic: Grossly normal but has been given iv sedation and pain meds  Diagnostic Studies & Laboratory data:     Recent Radiology Findings:   Dg Chest Port 1 View  01/02/2015   CLINICAL DATA:  Chest pain and shortness of breath for approximately 2 weeks  EXAM: PORTABLE CHEST - 1 VIEW  COMPARISON:  Chest radiograph and chest CT October 03, 2013  FINDINGS: Nodular opacities noted on previous studies are no longer apparent radiographically. Currently there is no edema or consolidation. Heart is upper normal in size with pulmonary vascularity within normal limits. No adenopathy. There is upper thoracic levoscoliosis. There is a small radiopaque foreign body overlying the medial right hemithorax midportion, stable.  IMPRESSION: No edema or consolidation.   Electronically Signed   By: Lowella Grip III M.D.   On: 01/02/2015 15:27     I have independently reviewed the above radiologic studies.  Recent Lab Findings: Lab Results  Component Value Date   WBC 8.1 01/02/2015   HGB 11.7* 01/02/2015   HCT 36.0 01/02/2015   PLT 226 01/02/2015   GLUCOSE 120* 01/02/2015   ALT 23 08/04/2014  AST 21 08/04/2014   NA 139 01/02/2015   K 3.8 01/02/2015   CL 104 01/02/2015   CREATININE 0.84 01/02/2015   BUN 17 01/02/2015   CO2 26 01/02/2015   TSH 3.806 07/25/2014   INR 1.03 10/23/2012      Assessment / Plan:      Evolving inferior mi unable to stent, I have  discussed the situation with the patient husband who had cabg 3 years ago. Very difficult situation but with recent response to new therapy emergency CABG  Offers short term chance. Patient husband has signed consent.   The goals risks and alternatives of the planned surgical procedure emergency CABG  have been discussed with the patient in detail. The risks of the procedure including death, infection, stroke, myocardial infarction, bleeding, blood transfusion have all been discussed specifically.  I have quoted Sheri Hernandez a 10 % of perioperative mortality and a complication rate as high as 40 %. The patient's husbands  questions have been answered.Sheri Hernandez 's husband has consented for his wife  to proceed with the planned procedure as she has been heavily medicated.  [name] MD      [address] [place] [address] Office [phone]   Beeper [phone]  01/02/2015 9:46 PM

## 2015-01-02 NOTE — Progress Notes (Addendum)
Ambulated to BR. Coming back to room c/o 5/10  CP and lt sided throat pain. Back to stretcher, pain gone within 2 minutes. Family in room.

## 2015-01-02 NOTE — Interval H&P Note (Signed)
History and Physical Interval Note:  01/02/2015 6:35 PM  Sheri Hernandez  has presented today for surgery, with the diagnosis of cp  The various methods of treatment have been discussed with the patient and family. After consideration of risks, benefits and other options for treatment, the patient has consented to  Procedure(s): LEFT HEART CATHETERIZATION WITH CORONARY ANGIOGRAM (N/A) as a surgical intervention .  The patient's history has been reviewed, patient examined, no change in status, stable for surgery.  I have reviewed the patient's chart and labs.  Questions were answered to the patient's satisfaction.     Kathlyn Sacramento

## 2015-01-02 NOTE — ED Notes (Signed)
Attempted report to RN on 2W, states will call back.

## 2015-01-03 ENCOUNTER — Encounter (HOSPITAL_COMMUNITY): Payer: Self-pay | Admitting: Cardiology

## 2015-01-03 ENCOUNTER — Inpatient Hospital Stay (HOSPITAL_COMMUNITY): Payer: 59

## 2015-01-03 DIAGNOSIS — Z951 Presence of aortocoronary bypass graft: Secondary | ICD-10-CM

## 2015-01-03 LAB — POCT I-STAT 3, ART BLOOD GAS (G3+)
ACID-BASE DEFICIT: 2 mmol/L (ref 0.0–2.0)
ACID-BASE EXCESS: 2 mmol/L (ref 0.0–2.0)
Acid-Base Excess: 2 mmol/L (ref 0.0–2.0)
Acid-base deficit: 3 mmol/L — ABNORMAL HIGH (ref 0.0–2.0)
Acid-base deficit: 4 mmol/L — ABNORMAL HIGH (ref 0.0–2.0)
Acid-base deficit: 3 mmol/L — ABNORMAL HIGH (ref 0.0–2.0)
Acid-base deficit: 4 mmol/L — ABNORMAL HIGH (ref 0.0–2.0)
BICARBONATE: 22 meq/L (ref 20.0–24.0)
BICARBONATE: 21 meq/L (ref 20.0–24.0)
BICARBONATE: 20.8 meq/L (ref 20.0–24.0)
Bicarbonate: 26.1 mEq/L — ABNORMAL HIGH (ref 20.0–24.0)
Bicarbonate: 27.6 mEq/L — ABNORMAL HIGH (ref 20.0–24.0)
Bicarbonate: 21 mEq/L (ref 20.0–24.0)
Bicarbonate: 21.4 mEq/L (ref 20.0–24.0)
O2 SAT: 100 %
O2 SAT: 97 %
O2 SAT: 97 %
O2 SAT: 94 %
O2 Saturation: 100 %
O2 Saturation: 88 %
O2 Saturation: 96 %
PCO2 ART: 31.3 mmHg — AB (ref 35.0–45.0)
PCO2 ART: 35.6 mmHg (ref 35.0–45.0)
PH ART: 7.381 (ref 7.350–7.450)
PH ART: 7.312 — AB (ref 7.350–7.450)
PO2 ART: 294 mmHg — AB (ref 80.0–100.0)
PO2 ART: 81 mmHg (ref 80.0–100.0)
PO2 ART: 86 mmHg (ref 80.0–100.0)
PO2 ART: 80 mmHg (ref 80.0–100.0)
Patient temperature: 36.1
Patient temperature: 37.2
Patient temperature: 38.3
TCO2: 27 mmol/L (ref 0–100)
TCO2: 23 mmol/L (ref 0–100)
TCO2: 29 mmol/L (ref 0–100)
TCO2: 22 mmol/L (ref 0–100)
TCO2: 22 mmol/L (ref 0–100)
TCO2: 22 mmol/L (ref 0–100)
TCO2: 23 mmol/L (ref 0–100)
pCO2 arterial: 39.9 mmHg (ref 35.0–45.0)
pCO2 arterial: 45.1 mmHg — ABNORMAL HIGH (ref 35.0–45.0)
pCO2 arterial: 32.6 mmHg — ABNORMAL LOW (ref 35.0–45.0)
pCO2 arterial: 40.7 mmHg (ref 35.0–45.0)
pCO2 arterial: 42.7 mmHg (ref 35.0–45.0)
pH, Arterial: 7.395 (ref 7.350–7.450)
pH, Arterial: 7.423 (ref 7.350–7.450)
pH, Arterial: 7.452 — ABNORMAL HIGH (ref 7.350–7.450)
pH, Arterial: 7.418 (ref 7.350–7.450)
pH, Arterial: 7.327 — ABNORMAL LOW (ref 7.350–7.450)
pO2, Arterial: 446 mmHg — ABNORMAL HIGH (ref 80.0–100.0)
pO2, Arterial: 59 mmHg — ABNORMAL LOW (ref 80.0–100.0)
pO2, Arterial: 91 mmHg (ref 80.0–100.0)

## 2015-01-03 LAB — POCT I-STAT, CHEM 8
BUN: 12 mg/dL (ref 6–23)
BUN: 12 mg/dL (ref 6–23)
BUN: 13 mg/dL (ref 6–23)
BUN: 13 mg/dL (ref 6–23)
BUN: 15 mg/dL (ref 6–23)
CALCIUM ION: 1.31 mmol/L — AB (ref 1.12–1.23)
CREATININE: 0.7 mg/dL (ref 0.50–1.10)
Calcium, Ion: 1.03 mmol/L — ABNORMAL LOW (ref 1.12–1.23)
Calcium, Ion: 1.12 mmol/L (ref 1.12–1.23)
Calcium, Ion: 1.13 mmol/L (ref 1.12–1.23)
Calcium, Ion: 1.27 mmol/L — ABNORMAL HIGH (ref 1.12–1.23)
Chloride: 100 mmol/L (ref 96–112)
Chloride: 101 mmol/L (ref 96–112)
Chloride: 101 mmol/L (ref 96–112)
Chloride: 103 mmol/L (ref 96–112)
Chloride: 99 mmol/L (ref 96–112)
Creatinine, Ser: 0.5 mg/dL (ref 0.50–1.10)
Creatinine, Ser: 0.6 mg/dL (ref 0.50–1.10)
Creatinine, Ser: 0.7 mg/dL (ref 0.50–1.10)
Creatinine, Ser: 0.7 mg/dL (ref 0.50–1.10)
Glucose, Bld: 144 mg/dL — ABNORMAL HIGH (ref 70–99)
Glucose, Bld: 151 mg/dL — ABNORMAL HIGH (ref 70–99)
Glucose, Bld: 163 mg/dL — ABNORMAL HIGH (ref 70–99)
Glucose, Bld: 169 mg/dL — ABNORMAL HIGH (ref 70–99)
Glucose, Bld: 183 mg/dL — ABNORMAL HIGH (ref 70–99)
HCT: 23 % — ABNORMAL LOW (ref 36.0–46.0)
HCT: 25 % — ABNORMAL LOW (ref 36.0–46.0)
HCT: 26 % — ABNORMAL LOW (ref 36.0–46.0)
HCT: 31 % — ABNORMAL LOW (ref 36.0–46.0)
HCT: 31 % — ABNORMAL LOW (ref 36.0–46.0)
Hemoglobin: 10.5 g/dL — ABNORMAL LOW (ref 12.0–15.0)
Hemoglobin: 10.5 g/dL — ABNORMAL LOW (ref 12.0–15.0)
Hemoglobin: 7.8 g/dL — ABNORMAL LOW (ref 12.0–15.0)
Hemoglobin: 8.5 g/dL — ABNORMAL LOW (ref 12.0–15.0)
Hemoglobin: 8.8 g/dL — ABNORMAL LOW (ref 12.0–15.0)
POTASSIUM: 4 mmol/L (ref 3.5–5.1)
Potassium: 3.8 mmol/L (ref 3.5–5.1)
Potassium: 4 mmol/L (ref 3.5–5.1)
Potassium: 4.1 mmol/L (ref 3.5–5.1)
Potassium: 4.5 mmol/L (ref 3.5–5.1)
Sodium: 138 mmol/L (ref 135–145)
Sodium: 138 mmol/L (ref 135–145)
Sodium: 138 mmol/L (ref 135–145)
Sodium: 139 mmol/L (ref 135–145)
Sodium: 140 mmol/L (ref 135–145)
TCO2: 23 mmol/L (ref 0–100)
TCO2: 23 mmol/L (ref 0–100)
TCO2: 24 mmol/L (ref 0–100)
TCO2: 24 mmol/L (ref 0–100)
TCO2: 25 mmol/L (ref 0–100)

## 2015-01-03 LAB — CBC
HCT: 25.8 % — ABNORMAL LOW (ref 36.0–46.0)
HEMATOCRIT: 26.4 % — AB (ref 36.0–46.0)
HEMATOCRIT: 25.5 % — AB (ref 36.0–46.0)
HEMOGLOBIN: 8.2 g/dL — AB (ref 12.0–15.0)
Hemoglobin: 8.6 g/dL — ABNORMAL LOW (ref 12.0–15.0)
Hemoglobin: 8.3 g/dL — ABNORMAL LOW (ref 12.0–15.0)
MCH: 25.8 pg — AB (ref 26.0–34.0)
MCH: 25.6 pg — AB (ref 26.0–34.0)
MCH: 25.8 pg — ABNORMAL LOW (ref 26.0–34.0)
MCHC: 32.6 g/dL (ref 30.0–36.0)
MCHC: 32.2 g/dL (ref 30.0–36.0)
MCHC: 32.2 g/dL (ref 30.0–36.0)
MCV: 79.3 fL (ref 78.0–100.0)
MCV: 79.7 fL (ref 78.0–100.0)
MCV: 80.1 fL (ref 78.0–100.0)
Platelets: 167 10*3/uL (ref 150–400)
Platelets: 152 10*3/uL (ref 150–400)
Platelets: 179 10*3/uL (ref 150–400)
RBC: 3.33 MIL/uL — ABNORMAL LOW (ref 3.87–5.11)
RBC: 3.2 MIL/uL — AB (ref 3.87–5.11)
RBC: 3.22 MIL/uL — ABNORMAL LOW (ref 3.87–5.11)
RDW: 18.4 % — ABNORMAL HIGH (ref 11.5–15.5)
RDW: 18.6 % — ABNORMAL HIGH (ref 11.5–15.5)
RDW: 19.1 % — ABNORMAL HIGH (ref 11.5–15.5)
WBC: 10.2 10*3/uL (ref 4.0–10.5)
WBC: 8.8 10*3/uL (ref 4.0–10.5)
WBC: 12.4 10*3/uL — ABNORMAL HIGH (ref 4.0–10.5)

## 2015-01-03 LAB — BASIC METABOLIC PANEL
Anion gap: 10 (ref 5–15)
Anion gap: 9 (ref 5–15)
BUN: 12 mg/dL (ref 6–23)
BUN: 12 mg/dL (ref 6–23)
CALCIUM: 8.4 mg/dL (ref 8.4–10.5)
CO2: 22 mmol/L (ref 19–32)
CO2: 23 mmol/L (ref 19–32)
CREATININE: 0.83 mg/dL (ref 0.50–1.10)
Calcium: 8.4 mg/dL (ref 8.4–10.5)
Chloride: 106 mmol/L (ref 96–112)
Chloride: 105 mmol/L (ref 96–112)
Creatinine, Ser: 0.94 mg/dL (ref 0.50–1.10)
GFR calc Af Amer: 90 mL/min — ABNORMAL LOW (ref >90)
GFR calc Af Amer: 77 mL/min — ABNORMAL LOW (ref >90)
GFR calc non Af Amer: 67 mL/min — ABNORMAL LOW (ref >90)
GFR, EST NON AFRICAN AMERICAN: 77 mL/min — AB (ref >90)
GLUCOSE: 154 mg/dL — AB (ref 70–99)
Glucose, Bld: 124 mg/dL — ABNORMAL HIGH (ref 70–99)
Potassium: 4 mmol/L (ref 3.5–5.1)
Potassium: 3.7 mmol/L (ref 3.5–5.1)
Sodium: 138 mmol/L (ref 135–145)
Sodium: 137 mmol/L (ref 135–145)

## 2015-01-03 LAB — GLUCOSE, CAPILLARY
GLUCOSE-CAPILLARY: 137 mg/dL — AB (ref 70–99)
GLUCOSE-CAPILLARY: 179 mg/dL — AB (ref 70–99)
GLUCOSE-CAPILLARY: 127 mg/dL — AB (ref 70–99)
GLUCOSE-CAPILLARY: 127 mg/dL — AB (ref 70–99)
GLUCOSE-CAPILLARY: 119 mg/dL — AB (ref 70–99)
GLUCOSE-CAPILLARY: 106 mg/dL — AB (ref 70–99)
Glucose-Capillary: 135 mg/dL — ABNORMAL HIGH (ref 70–99)
Glucose-Capillary: 126 mg/dL — ABNORMAL HIGH (ref 70–99)
Glucose-Capillary: 145 mg/dL — ABNORMAL HIGH (ref 70–99)
Glucose-Capillary: 144 mg/dL — ABNORMAL HIGH (ref 70–99)
Glucose-Capillary: 152 mg/dL — ABNORMAL HIGH (ref 70–99)
Glucose-Capillary: 184 mg/dL — ABNORMAL HIGH (ref 70–99)
Glucose-Capillary: 154 mg/dL — ABNORMAL HIGH (ref 70–99)
Glucose-Capillary: 125 mg/dL — ABNORMAL HIGH (ref 70–99)
Glucose-Capillary: 141 mg/dL — ABNORMAL HIGH (ref 70–99)
Glucose-Capillary: 108 mg/dL — ABNORMAL HIGH (ref 70–99)
Glucose-Capillary: 137 mg/dL — ABNORMAL HIGH (ref 70–99)

## 2015-01-03 LAB — HEMOGLOBIN AND HEMATOCRIT, BLOOD
HCT: 24.3 % — ABNORMAL LOW (ref 36.0–46.0)
Hemoglobin: 7.8 g/dL — ABNORMAL LOW (ref 12.0–15.0)

## 2015-01-03 LAB — POCT I-STAT 4, (NA,K, GLUC, HGB,HCT)
Glucose, Bld: 120 mg/dL — ABNORMAL HIGH (ref 70–99)
HCT: 28 % — ABNORMAL LOW (ref 36.0–46.0)
Hemoglobin: 9.5 g/dL — ABNORMAL LOW (ref 12.0–15.0)
POTASSIUM: 3.6 mmol/L (ref 3.5–5.1)
Sodium: 139 mmol/L (ref 135–145)

## 2015-01-03 LAB — LIPID PANEL
Cholesterol: 90 mg/dL (ref 0–200)
HDL: 15 mg/dL — ABNORMAL LOW (ref >39)
LDL Cholesterol: 60 mg/dL (ref 0–99)
Total CHOL/HDL Ratio: 6 RATIO
Triglycerides: 77 mg/dL (ref <150)
VLDL: 15 mg/dL (ref 0–40)

## 2015-01-03 LAB — PLATELET COUNT: Platelets: 186 10*3/uL (ref 150–400)

## 2015-01-03 LAB — POCT ACTIVATED CLOTTING TIME: Activated Clotting Time: 134 seconds

## 2015-01-03 LAB — TSH: TSH: 0.262 u[IU]/mL — ABNORMAL LOW (ref 0.350–4.500)

## 2015-01-03 LAB — HEPATIC FUNCTION PANEL
ALT: 23 U/L (ref 0–35)
AST: 59 U/L — ABNORMAL HIGH (ref 0–37)
Albumin: 2.5 g/dL — ABNORMAL LOW (ref 3.5–5.2)
Alkaline Phosphatase: 34 U/L — ABNORMAL LOW (ref 39–117)
Bilirubin, Direct: 0.2 mg/dL (ref 0.0–0.5)
Indirect Bilirubin: 0.5 mg/dL (ref 0.3–0.9)
Total Bilirubin: 0.7 mg/dL (ref 0.3–1.2)
Total Protein: 4.3 g/dL — ABNORMAL LOW (ref 6.0–8.3)

## 2015-01-03 LAB — MAGNESIUM
Magnesium: 1.6 mg/dL (ref 1.5–2.5)
Magnesium: 2.6 mg/dL — ABNORMAL HIGH (ref 1.5–2.5)
Magnesium: 2.1 mg/dL (ref 1.5–2.5)

## 2015-01-03 LAB — APTT: APTT: 45 s — AB (ref 24–37)

## 2015-01-03 LAB — PROTIME-INR
INR: 1.62 — ABNORMAL HIGH (ref 0.00–1.49)
Prothrombin Time: 19.4 seconds — ABNORMAL HIGH (ref 11.6–15.2)

## 2015-01-03 LAB — MRSA PCR SCREENING: MRSA by PCR: NEGATIVE

## 2015-01-03 MED ORDER — DEXTROSE 5 % IV SOLN
0.0000 ug/min | INTRAVENOUS | Status: DC
  Administered 2015-01-03: 10 ug/min via INTRAVENOUS
  Filled 2015-01-03: qty 4

## 2015-01-03 MED ORDER — ALBUMIN HUMAN 5 % IV SOLN: INTRAVENOUS | Status: DC | PRN

## 2015-01-03 MED ORDER — ACETAMINOPHEN 160 MG/5ML PO SOLN
650.0000 mg | Freq: Once | ORAL | Status: AC
  Administered 2015-01-03: 650 mg

## 2015-01-03 MED ORDER — ACETAMINOPHEN 650 MG RE SUPP: 650.0000 mg | Freq: Once | RECTAL | Status: AC

## 2015-01-03 MED ORDER — BISACODYL 5 MG PO TBEC
10.0000 mg | DELAYED_RELEASE_TABLET | Freq: Every day | ORAL | Status: DC
  Administered 2015-01-05: 10 mg via ORAL
  Filled 2015-01-03: qty 2

## 2015-01-03 MED ORDER — POTASSIUM CHLORIDE 10 MEQ/50ML IV SOLN
10.0000 meq | INTRAVENOUS | Status: AC
  Administered 2015-01-03 (×3): 10 meq via INTRAVENOUS

## 2015-01-03 MED ORDER — METOCLOPRAMIDE HCL 5 MG/ML IJ SOLN
10.0000 mg | Freq: Four times a day (QID) | INTRAMUSCULAR | Status: AC
  Administered 2015-01-03 (×4): 10 mg via INTRAVENOUS
  Filled 2015-01-03 (×4): qty 2

## 2015-01-03 MED ORDER — BISACODYL 10 MG RE SUPP: 10.0000 mg | Freq: Every day | RECTAL | Status: DC

## 2015-01-03 MED ORDER — PROTAMINE SULFATE 10 MG/ML IV SOLN
INTRAVENOUS | Status: DC | PRN
  Administered 2015-01-03: 240 mg via INTRAVENOUS

## 2015-01-03 MED ORDER — SODIUM CHLORIDE 0.9 % IJ SOLN
3.0000 mL | Freq: Two times a day (BID) | INTRAMUSCULAR | Status: DC
  Administered 2015-01-04 – 2015-01-05 (×3): 3 mL via INTRAVENOUS

## 2015-01-03 MED ORDER — FAMOTIDINE IN NACL 20-0.9 MG/50ML-% IV SOLN
20.0000 mg | Freq: Two times a day (BID) | INTRAVENOUS | Status: DC
  Administered 2015-01-03: 20 mg via INTRAVENOUS

## 2015-01-03 MED ORDER — MIDAZOLAM HCL 2 MG/2ML IJ SOLN
INTRAMUSCULAR | Status: AC
  Administered 2015-01-03: 2 mg via INTRAVENOUS
  Filled 2015-01-03: qty 2

## 2015-01-03 MED ORDER — AMIODARONE HCL IN DEXTROSE 360-4.14 MG/200ML-% IV SOLN
30.0000 mg/h | INTRAVENOUS | Status: DC
  Filled 2015-01-03 (×6): qty 200

## 2015-01-03 MED ORDER — HEMOSTATIC AGENTS (NO CHARGE) OPTIME
TOPICAL | Status: DC | PRN
  Administered 2015-01-03: 1 via TOPICAL

## 2015-01-03 MED ORDER — DEXMEDETOMIDINE HCL IN NACL 200 MCG/50ML IV SOLN
0.0000 ug/kg/h | INTRAVENOUS | Status: DC
  Administered 2015-01-03: 0.7 ug/kg/h via INTRAVENOUS
  Administered 2015-01-03: 0.5 ug/kg/h via INTRAVENOUS
  Administered 2015-01-03: 0.7 ug/kg/h via INTRAVENOUS
  Filled 2015-01-03 (×2): qty 50

## 2015-01-03 MED ORDER — CETYLPYRIDINIUM CHLORIDE 0.05 % MT LIQD
7.0000 mL | Freq: Two times a day (BID) | OROMUCOSAL | Status: DC
  Administered 2015-01-03 – 2015-01-08 (×9): 7 mL via OROMUCOSAL

## 2015-01-03 MED ORDER — DOPAMINE-DEXTROSE 3.2-5 MG/ML-% IV SOLN
INTRAVENOUS | Status: DC | PRN
Start: 2015-01-03 — End: 2015-01-03
  Administered 2015-01-03: 3 ug/kg/min via INTRAVENOUS

## 2015-01-03 MED ORDER — AMIODARONE HCL IN DEXTROSE 360-4.14 MG/200ML-% IV SOLN
60.0000 mg/h | INTRAVENOUS | Status: AC
  Administered 2015-01-03 (×2): 60 mg/h via INTRAVENOUS
  Filled 2015-01-03 (×2): qty 200

## 2015-01-03 MED ORDER — SODIUM CHLORIDE 0.9 % IV SOLN
INTRAVENOUS | Status: DC | PRN
  Administered 2015-01-03: 01:00:00 via INTRAVENOUS

## 2015-01-03 MED ORDER — METOPROLOL TARTRATE 25 MG/10 ML ORAL SUSPENSION
12.5000 mg | Freq: Two times a day (BID) | ORAL | Status: DC
  Filled 2015-01-03 (×6): qty 5

## 2015-01-03 MED ORDER — SODIUM CHLORIDE 0.9 % IV SOLN: 250.0000 mL | INTRAVENOUS | Status: DC | PRN

## 2015-01-03 MED ORDER — LEVOTHYROXINE SODIUM 137 MCG PO TABS
137.0000 ug | ORAL_TABLET | Freq: Every day | ORAL | Status: DC
  Administered 2015-01-04 – 2015-01-09 (×6): 137 ug via ORAL
  Filled 2015-01-03 (×7): qty 1

## 2015-01-03 MED ORDER — SODIUM CHLORIDE 0.9 % IV SOLN
INTRAVENOUS | Status: DC
  Administered 2015-01-03: 2.6 [IU]/h via INTRAVENOUS
  Filled 2015-01-03: qty 2.5

## 2015-01-03 MED ORDER — SUCCINYLCHOLINE CHLORIDE 20 MG/ML IJ SOLN
INTRAMUSCULAR | Status: AC
  Filled 2015-01-03: qty 1

## 2015-01-03 MED ORDER — AMIODARONE HCL 200 MG PO TABS: 200.0000 mg | ORAL_TABLET | Freq: Every day | ORAL | Status: DC

## 2015-01-03 MED ORDER — SODIUM CHLORIDE 0.9 % IJ SOLN: 3.0000 mL | INTRAMUSCULAR | Status: DC | PRN

## 2015-01-03 MED ORDER — MAGNESIUM SULFATE 4 GM/100ML IV SOLN
4.0000 g | Freq: Once | INTRAVENOUS | Status: AC
  Administered 2015-01-03: 4 g via INTRAVENOUS
  Filled 2015-01-03: qty 100

## 2015-01-03 MED ORDER — LACTATED RINGERS IV SOLN
INTRAVENOUS | Status: DC
  Administered 2015-01-03: 20 mL/h via INTRAVENOUS

## 2015-01-03 MED ORDER — OXYCODONE HCL 5 MG PO TABS
5.0000 mg | ORAL_TABLET | ORAL | Status: DC | PRN
  Administered 2015-01-03 – 2015-01-05 (×12): 10 mg via ORAL
  Filled 2015-01-03 (×12): qty 2

## 2015-01-03 MED ORDER — PANTOPRAZOLE SODIUM 40 MG PO TBEC
40.0000 mg | DELAYED_RELEASE_TABLET | Freq: Every day | ORAL | Status: DC
  Administered 2015-01-05: 40 mg via ORAL
  Filled 2015-01-03: qty 1

## 2015-01-03 MED ORDER — METOPROLOL TARTRATE 1 MG/ML IV SOLN
2.5000 mg | INTRAVENOUS | Status: DC | PRN
  Administered 2015-01-04: 5 mg via INTRAVENOUS

## 2015-01-03 MED ORDER — LACTATED RINGERS IV SOLN
500.0000 mL | Freq: Once | INTRAVENOUS | Status: AC | PRN
  Administered 2015-01-03: 500 mL via INTRAVENOUS

## 2015-01-03 MED ORDER — TRAMADOL HCL 50 MG PO TABS
50.0000 mg | ORAL_TABLET | ORAL | Status: DC | PRN
  Administered 2015-01-04 – 2015-01-05 (×2): 100 mg via ORAL
  Filled 2015-01-03 (×2): qty 2

## 2015-01-03 MED ORDER — CETYLPYRIDINIUM CHLORIDE 0.05 % MT LIQD
7.0000 mL | Freq: Four times a day (QID) | OROMUCOSAL | Status: DC
  Administered 2015-01-03 (×2): 7 mL via OROMUCOSAL

## 2015-01-03 MED ORDER — DEXAMETHASONE SODIUM PHOSPHATE 4 MG/ML IJ SOLN
INTRAMUSCULAR | Status: AC
  Filled 2015-01-03: qty 2

## 2015-01-03 MED ORDER — PROTAMINE SULFATE 10 MG/ML IV SOLN
INTRAVENOUS | Status: AC
  Filled 2015-01-03: qty 10

## 2015-01-03 MED ORDER — NITROGLYCERIN IN D5W 200-5 MCG/ML-% IV SOLN
0.0000 ug/min | INTRAVENOUS | Status: DC
Start: 2015-01-03 — End: 2015-01-05
  Administered 2015-01-03: 5 ug/min via INTRAVENOUS

## 2015-01-03 MED ORDER — DOCUSATE SODIUM 100 MG PO CAPS
200.0000 mg | ORAL_CAPSULE | Freq: Every day | ORAL | Status: DC
  Administered 2015-01-04 – 2015-01-05 (×2): 200 mg via ORAL
  Filled 2015-01-03 (×2): qty 2

## 2015-01-03 MED ORDER — ACETAMINOPHEN 500 MG PO TABS
1000.0000 mg | ORAL_TABLET | Freq: Four times a day (QID) | ORAL | Status: DC
  Administered 2015-01-04 – 2015-01-05 (×3): 1000 mg via ORAL
  Filled 2015-01-03 (×12): qty 2

## 2015-01-03 MED ORDER — SODIUM CHLORIDE 0.45 % IV SOLN
INTRAVENOUS | Status: DC | PRN
  Administered 2015-01-03: 20 mL/h via INTRAVENOUS

## 2015-01-03 MED ORDER — MIDAZOLAM HCL 5 MG/5ML IJ SOLN
INTRAMUSCULAR | Status: DC | PRN
  Administered 2015-01-02: 3 mg via INTRAVENOUS
  Administered 2015-01-02 (×2): 2 mg via INTRAVENOUS
  Administered 2015-01-03: 3 mg via INTRAVENOUS

## 2015-01-03 MED ORDER — ETOMIDATE 2 MG/ML IV SOLN
INTRAVENOUS | Status: AC
  Filled 2015-01-03: qty 10

## 2015-01-03 MED ORDER — AMIODARONE LOAD VIA INFUSION
150.0000 mg | Freq: Once | INTRAVENOUS | Status: AC
  Administered 2015-01-03: 150 mg via INTRAVENOUS
  Filled 2015-01-03: qty 83.34

## 2015-01-03 MED ORDER — PHENYLEPHRINE HCL 10 MG/ML IJ SOLN
0.0000 ug/min | INTRAVENOUS | Status: DC
  Administered 2015-01-03: 20 ug/min via INTRAVENOUS
  Filled 2015-01-03: qty 2

## 2015-01-03 MED ORDER — FENTANYL CITRATE (PF) 250 MCG/5ML IJ SOLN
INTRAMUSCULAR | Status: AC
  Filled 2015-01-03: qty 5

## 2015-01-03 MED ORDER — ASPIRIN EC 325 MG PO TBEC
325.0000 mg | DELAYED_RELEASE_TABLET | Freq: Every day | ORAL | Status: DC
Start: 2015-01-04 — End: 2015-01-05
  Administered 2015-01-04 – 2015-01-05 (×2): 325 mg via ORAL
  Filled 2015-01-03 (×2): qty 1

## 2015-01-03 MED ORDER — AMIODARONE HCL 200 MG PO TABS
200.0000 mg | ORAL_TABLET | Freq: Two times a day (BID) | ORAL | Status: DC
  Administered 2015-01-03 – 2015-01-09 (×13): 200 mg via ORAL
  Filled 2015-01-03 (×14): qty 1

## 2015-01-03 MED ORDER — ROCURONIUM BROMIDE 50 MG/5ML IV SOLN
INTRAVENOUS | Status: AC
  Filled 2015-01-03: qty 2

## 2015-01-03 MED ORDER — ONDANSETRON HCL 4 MG/2ML IJ SOLN
4.0000 mg | Freq: Four times a day (QID) | INTRAMUSCULAR | Status: DC | PRN
  Administered 2015-01-03: 4 mg via INTRAVENOUS
  Filled 2015-01-03: qty 2

## 2015-01-03 MED ORDER — MORPHINE SULFATE 2 MG/ML IJ SOLN
INTRAMUSCULAR | Status: AC
  Administered 2015-01-03: 2 mg via INTRAVENOUS
  Filled 2015-01-03: qty 2

## 2015-01-03 MED ORDER — DEXTROSE 5 % IV SOLN
1.5000 g | Freq: Two times a day (BID) | INTRAVENOUS | Status: AC
  Administered 2015-01-03 – 2015-01-04 (×4): 1.5 g via INTRAVENOUS
  Filled 2015-01-03 (×4): qty 1.5

## 2015-01-03 MED ORDER — METOPROLOL TARTRATE 12.5 MG HALF TABLET
12.5000 mg | ORAL_TABLET | Freq: Two times a day (BID) | ORAL | Status: DC
  Administered 2015-01-04 – 2015-01-05 (×3): 12.5 mg via ORAL
  Filled 2015-01-03 (×6): qty 1

## 2015-01-03 MED ORDER — PHENYLEPHRINE 40 MCG/ML (10ML) SYRINGE FOR IV PUSH (FOR BLOOD PRESSURE SUPPORT)
PREFILLED_SYRINGE | INTRAVENOUS | Status: AC
  Filled 2015-01-03: qty 40

## 2015-01-03 MED ORDER — ACETAMINOPHEN 160 MG/5ML PO SOLN
1000.0000 mg | Freq: Four times a day (QID) | ORAL | Status: DC
  Administered 2015-01-03 – 2015-01-04 (×4): 1000 mg
  Filled 2015-01-03 (×4): qty 40.6

## 2015-01-03 MED ORDER — CHLORHEXIDINE GLUCONATE 0.12 % MT SOLN
15.0000 mL | Freq: Two times a day (BID) | OROMUCOSAL | Status: DC
  Administered 2015-01-03: 15 mL via OROMUCOSAL
  Filled 2015-01-03: qty 15

## 2015-01-03 MED ORDER — SODIUM CHLORIDE 0.9 % IV SOLN: 250.0000 mL | INTRAVENOUS | Status: DC

## 2015-01-03 MED ORDER — ALBUMIN HUMAN 5 % IV SOLN
250.0000 mL | INTRAVENOUS | Status: AC | PRN
  Administered 2015-01-03 (×3): 250 mL via INTRAVENOUS
  Filled 2015-01-03 (×2): qty 250

## 2015-01-03 MED ORDER — MORPHINE SULFATE 2 MG/ML IJ SOLN
2.0000 mg | INTRAMUSCULAR | Status: DC | PRN
  Administered 2015-01-03: 4 mg via INTRAVENOUS
  Administered 2015-01-03: 2 mg via INTRAVENOUS
  Administered 2015-01-03: 4 mg via INTRAVENOUS
  Administered 2015-01-03 (×4): 2 mg via INTRAVENOUS
  Administered 2015-01-04 (×3): 4 mg via INTRAVENOUS
  Filled 2015-01-03 (×2): qty 2
  Filled 2015-01-03: qty 1
  Filled 2015-01-03 (×8): qty 2

## 2015-01-03 MED ORDER — ATORVASTATIN CALCIUM 80 MG PO TABS
80.0000 mg | ORAL_TABLET | Freq: Every day | ORAL | Status: DC
  Administered 2015-01-04 – 2015-01-08 (×5): 80 mg via ORAL
  Filled 2015-01-03 (×7): qty 1

## 2015-01-03 MED ORDER — LIDOCAINE HCL (CARDIAC) 20 MG/ML IV SOLN
INTRAVENOUS | Status: AC
  Filled 2015-01-03: qty 10

## 2015-01-03 MED ORDER — DOPAMINE-DEXTROSE 3.2-5 MG/ML-% IV SOLN
0.0000 ug/kg/min | INTRAVENOUS | Status: DC
  Administered 2015-01-03: 2 ug/kg/min via INTRAVENOUS

## 2015-01-03 MED ORDER — ALBUMIN HUMAN 5 % IV SOLN
INTRAVENOUS | Status: DC | PRN
  Administered 2015-01-03 (×2): via INTRAVENOUS

## 2015-01-03 MED ORDER — ASPIRIN 81 MG PO CHEW: 324.0000 mg | CHEWABLE_TABLET | Freq: Every day | ORAL | Status: DC

## 2015-01-03 MED ORDER — VANCOMYCIN HCL IN DEXTROSE 1-5 GM/200ML-% IV SOLN
1000.0000 mg | Freq: Once | INTRAVENOUS | Status: AC
  Administered 2015-01-03: 1000 mg via INTRAVENOUS
  Filled 2015-01-03: qty 200

## 2015-01-03 MED ORDER — MORPHINE SULFATE 2 MG/ML IJ SOLN
1.0000 mg | INTRAMUSCULAR | Status: AC | PRN
  Administered 2015-01-03 (×3): 2 mg via INTRAVENOUS
  Administered 2015-01-03: 4 mg via INTRAVENOUS
  Administered 2015-01-03 (×2): 2 mg via INTRAVENOUS
  Filled 2015-01-03: qty 1

## 2015-01-03 MED ORDER — SODIUM CHLORIDE 0.9 % IV SOLN
INTRAVENOUS | Status: DC
  Administered 2015-01-03: 10 mL/h via INTRAVENOUS
  Administered 2015-01-04: 11:00:00 via INTRAVENOUS

## 2015-01-03 MED ORDER — MIDAZOLAM HCL 2 MG/2ML IJ SOLN
2.0000 mg | INTRAMUSCULAR | Status: DC | PRN
  Administered 2015-01-03 (×5): 2 mg via INTRAVENOUS
  Filled 2015-01-03 (×5): qty 2

## 2015-01-03 MED ORDER — INSULIN REGULAR BOLUS VIA INFUSION
0.0000 [IU] | Freq: Three times a day (TID) | INTRAVENOUS | Status: DC
  Filled 2015-01-03: qty 10

## 2015-01-03 MED ORDER — GLYCOPYRROLATE 0.2 MG/ML IJ SOLN
INTRAMUSCULAR | Status: AC
  Filled 2015-01-03: qty 3

## 2015-01-03 MED ORDER — PROTAMINE SULFATE 10 MG/ML IV SOLN
INTRAVENOUS | Status: AC
  Filled 2015-01-03: qty 25

## 2015-01-03 NOTE — Progress Notes (Signed)
Patient ID: Sheri Hernandez, female   DOB: 1956/08/30, 59 y.o.   MRN: [id number] TCTS DAILY ICU PROGRESS NOTE                   [address]            [place] [address]          [phone]   1 Day Post-Op Procedure(s) (LRB): CORONARY ARTERY BYPASS GRAFTING (CABG)TIMES TWO USING RIGHT GREATER SAPHENOUS LEG VEIN HARVESTED ENDOSCOPICALLY (N/A)  Total Length of Stay:  LOS: 1 day   Subjective: Remains sedated on vent, neuro intact and follows commands   Objective: Vital signs in last 24 hours: Temp:  [96.4 F (35.8 C)-100.2 F (37.9 C)] 100.2 F (37.9 C) (04/30 1000) Pulse Rate:  [94-126] 110 (04/30 0600) Cardiac Rhythm:  [-] Sinus tachycardia (04/30 0800) Resp:  [0-33] 5 (04/30 1000) BP: (80-160)/(50-93) 80/51 mmHg (04/30 1000) SpO2:  [20 %-100 %] 96 % (04/30 1000) Arterial Line BP: (93-160)/(53-83) 95/54 mmHg (04/30 1000) FiO2 (%):  [50 %] 50 % (04/30 0800) Weight:  [180 lb (81.647 kg)-191 lb 9.3 oz (86.9 kg)] 191 lb 9.3 oz (86.9 kg) (04/30 0230)  Filed Weights   01/02/15 1134 01/03/15 0230  Weight: 180 lb (81.647 kg) 191 lb 9.3 oz (86.9 kg)    Weight change:    Hemodynamic parameters for last 24 hours: PAP: (19-37)/(13-29) 25/16 mmHg CO:  [3.1 L/min-4.5 L/min] 3.1 L/min CI:  [1.6 L/min/m2-2.3 L/min/m2] 1.6 L/min/m2  Intake/Output from previous day: 04/29 0701 - 04/30 0700 In: 2993 [I.V.:4287.2; Blood:390; NG/GT:30; IV Piggyback:1289.8] Out: 2129 [Urine:1234; Emesis/NG output:200; Drains:20; Blood:600; Chest Tube:75]  Intake/Output this shift: Total I/O In: 638.6 [I.V.:358.6; NG/GT:30; IV Piggyback:250] Out: 365 [Urine:210; Emesis/NG output:100; Chest Tube:55]  Current Meds: Scheduled Meds: . acetaminophen  1,000 mg Oral 4 times per day   Or  . acetaminophen (TYLENOL) oral liquid 160 mg/5 mL  1,000 mg Per Tube 4 times per day  . amiodarone  200 mg Oral Q12H   Followed by  . [START ON 01/10/2015] amiodarone  200 mg Oral Daily  . antiseptic  oral rinse  7 mL Mouth Rinse QID  . [START ON 01/04/2015] aspirin EC  325 mg Oral Daily   Or  . [START ON 01/04/2015] aspirin  324 mg Per Tube Daily  . [START ON 01/04/2015] atorvastatin  80 mg Oral q1800  . [START ON 01/04/2015] bisacodyl  10 mg Oral Daily   Or  . [START ON 01/04/2015] bisacodyl  10 mg Rectal Daily  . cefUROXime (ZINACEF)  IV  1.5 g Intravenous Q12H  . chlorhexidine  15 mL Mouth Rinse BID  . [START ON 01/04/2015] docusate sodium  200 mg Oral Daily  . famotidine (PEPCID) IV  20 mg Intravenous Q12H  . insulin regular  0-10 Units Intravenous TID WC  . [START ON 01/04/2015] levothyroxine  137 mcg Oral QAC breakfast  . metoCLOPramide (REGLAN) injection  10 mg Intravenous 4 times per day  . metoprolol tartrate  12.5 mg Oral BID   Or  . metoprolol tartrate  12.5 mg Per Tube BID  . [START ON 01/05/2015] pantoprazole  40 mg Oral Daily  . [START ON 01/04/2015] sodium chloride  3 mL Intravenous Q12H   Continuous Infusions: . sodium chloride 20 mL/hr at 01/03/15 0700  . [START ON 01/04/2015] sodium chloride    . sodium chloride 10 mL/hr at 01/03/15 0700  . amiodarone 30 mg/hr (01/03/15 1000)  . dexmedetomidine  0.2 mcg/kg/hr (01/03/15 1000)  . DOPamine 2 mcg/kg/min (01/03/15 1000)  . insulin (NOVOLIN-R) infusion 3.7 Units/hr (01/03/15 1000)  . lactated ringers 20 mL/hr at 01/03/15 0700  . lactated ringers 20 mL/hr at 01/03/15 0700  . nitroGLYCERIN Stopped (01/03/15 0700)  . norepinephrine (LEVOPHED) Adult infusion Stopped (01/03/15 0415)  . phenylephrine (NEO-SYNEPHRINE) Adult infusion 25 mcg/min (01/03/15 1000)   PRN Meds:.sodium chloride, albumin human, metoprolol, midazolam, morphine injection, morphine injection, ondansetron (ZOFRAN) IV, oxyCODONE, [START ON 01/04/2015] sodium chloride, traMADol  General appearance: cooperative and no distress Neurologic: intact Heart: regular rate and rhythm, S1, S2 normal, no murmur, click, rub or gallop Lungs: diminished breath sounds  bibasilar Abdomen: soft, non-tender; bowel sounds normal; no masses,  no organomegaly Extremities: extremities normal, atraumatic, no cyanosis or edema and Homans sign is negative, no sign of DVT Wound: sternum stable  Lab Results: CBC: Recent Labs  01/03/15 0210 01/03/15 0216 01/03/15 0608  WBC 10.2  --  8.8  HGB 8.6* 9.5* 8.2*  HCT 26.4* 28.0* 25.5*  PLT 167  --  152   BMET:  Recent Labs  01/02/15 1259  01/03/15 0117 01/03/15 0216 01/03/15 0608  NA 139  < > 138 139 138  K 3.8  < > 3.8 3.6 4.0  CL 104  < > 101  --  106  CO2 26  --   --   --  22  GLUCOSE 120*  < > 163* 120* 154*  BUN 17  < > 12  --  12  CREATININE 0.84  < > 0.70  --  0.83  CALCIUM 10.0  --   --   --  8.4  < > = values in this interval not displayed.  PT/INR:  Recent Labs  01/03/15 0210  LABPROT 19.4*  INR 1.62*   Radiology:   Dg Chest Port 1 View  01/03/2015   CLINICAL DATA:  Status post CABG. Postoperative chest radiograph. Initial encounter.  EXAM: PORTABLE CHEST - 1 VIEW  COMPARISON:  Chest radiograph performed 01/02/2015  FINDINGS: The patient's endotracheal tube is seen ending 5-6 cm above the carina.  The enteric tube is noted ending overlying the body of the stomach. A right IJ Swan-Ganz catheter is seen ending at the right main pulmonary artery. A mediastinal drain is noted. Pacing wires are seen.  Mild bilateral atelectasis is noted. This is slightly more prominent at the left lung base. No pleural effusion or pneumothorax is seen.  The cardiomediastinal silhouette is borderline enlarged. The patient is status post median sternotomy, with evidence of prior CABG. No acute osseous abnormalities are seen.  IMPRESSION: 1. Endotracheal tube seen ending 5-6 cm above the carina. 2. Tubes and lines as described above. 3. Mild bilateral atelectasis, slightly more prominent at the left lung base. 4. Borderline cardiomegaly.   Electronically Signed   By: Garald Balding M.D.   On: 01/03/2015 03:29    Assessment/Plan: S/P Procedure(s) (LRB): CORONARY ARTERY BYPASS GRAFTING (CABG)TIMES TWO USING RIGHT GREATER SAPHENOUS LEG VEIN HARVESTED ENDOSCOPICALLY (N/A) Mobilize Diuresis Continue foley due to acute urinary retention, strict I&O, patient critically ill, patient in ICU and urinary output monitoring decrease sedation and try to wean ventilator Expected Acute  Blood - loss Anemia    [name] 01/03/2015 10:15 AM

## 2015-01-03 NOTE — Procedures (Signed)
Arterial Catheter Insertion Procedure Note ANALLELI Hernandez [id number] [birth year]  Procedure: Insertion of Arterial Catheter  Indications: Blood pressure monitoring  Procedure Details Consent: Unable to obtain consent because of altered level of consciousness. Time Out: Verified patient identification, verified procedure, site/side was marked, verified correct patient position, special equipment/implants available, medications/allergies/relevent history reviewed, required imaging and test results available.  Performed  Maximum sterile technique was used including antiseptics, cap, gloves, gown, hand hygiene, mask and sheet. Skin prep: Chlorhexidine; local anesthetic administered 20 gauge catheter was inserted into left radial artery using the Seldinger technique.  Evaluation Blood flow good; BP tracing good. Complications: No apparent complications.  Left radial Arterial Line placed per MD Gerhardt order, Cath lab to take out sheath after Arterial line placed.  Sheri Hernandez 01/03/2015

## 2015-01-03 NOTE — Transfer of Care (Signed)
Immediate Anesthesia Transfer of Care Note  Patient: Sheri Hernandez  Procedure(s) Performed: Procedure(s): CORONARY ARTERY BYPASS GRAFTING (CABG)TIMES TWO USING RIGHT GREATER SAPHENOUS LEG VEIN HARVESTED ENDOSCOPICALLY (N/A)  Patient Location: SICU  Anesthesia Type:General  Level of Consciousness: sedated, unresponsive and Patient remains intubated per anesthesia plan  Airway & Oxygen Therapy: Patient remains intubated per anesthesia plan and Patient placed on Ventilator (see vital sign flow sheet for setting)  Post-op Assessment: Report given to RN and Post -op Vital signs reviewed and stable  Post vital signs: Reviewed and stable  Last Vitals:  Filed Vitals:   01/02/15 2150  BP: 137/81  Pulse: 101  Temp:   Resp: 13    Complications: No apparent anesthesia complications

## 2015-01-03 NOTE — Brief Op Note (Addendum)
      [address]       [place] [address]             [phone]      01/02/2015  12:44 AM  PATIENT:  Sheri Hernandez  59 y.o. female  PRE-OPERATIVE DIAGNOSIS:  CAD  POST-OPERATIVE DIAGNOSIS:  CAD  PROCEDURE:  Procedure(s):  EMERGENT CORONARY ARTERY BYPASS GRAFTING x 2 -SEQUENTIAL SVG to PDA, PLVB  ENDOSCOPIC HARVEST OF THE GREATER SAPHENOUS VEIN -Right Leg  SURGEON:  Surgeon(s) and Role:    * [name], MD - Primary  PHYSICIAN ASSISTANT: Erin Barrett PA-C  ANESTHESIA:   general  EBL:  Total I/O In: 69.8 [IV Piggyback:69.8] Out: 300 [Urine:300]  BLOOD ADMINISTERED: CELLSAVER  DRAINS: JP Drain Right Leg, Mediastinal Chest Drains   LOCAL MEDICATIONS USED:  NONE  SPECIMEN:  No Specimen  DISPOSITION OF SPECIMEN:  N/A  COUNTS:  YES  TOURNIQUET:  * No tourniquets in log *  DICTATION: .Dragon Dictation  PLAN OF CARE: Admit to inpatient   PATIENT DISPOSITION:  ICU - intubated and hemodynamically stable.   Delay start of Pharmacological VTE agent (>24hrs) due to surgical blood loss or risk of bleeding: yes

## 2015-01-03 NOTE — Progress Notes (Signed)
5 French right radial sheath removed and TR band placed with 12cc of air in the band.  A left radial a-line was placed by respiratory before sheath was removed.  Site looks good with no hematoma level 0.  Instructions given to RN for band removal.  There is a faint SAT waveform from her right thumb.  RN will continue to monitor.

## 2015-01-03 NOTE — OR Nursing (Signed)
0117 first call made to SICU, 0137 second call made to SICU

## 2015-01-03 NOTE — Procedures (Signed)
Extubation Procedure Note  Patient Details:   Name: Sheri Hernandez DOB: 1956-08-21 MRN: [id number]   Airway Documentation:     Evaluation  O2 sats: stable throughout Complications: No apparent complications Patient did tolerate procedure well. Bilateral Breath Sounds: Rhonchi, Diminished Suctioning: Airway Yes   Pt tolerated SICU rapid wean, positive for cuff leak, extubated to 6L [place]. No stridor or dyspnea noted after extubation. Very poor effort on IS, will attempt again later. RT will continue to monitor.   [name] 01/03/2015, 11:45 AM

## 2015-01-04 ENCOUNTER — Inpatient Hospital Stay (HOSPITAL_COMMUNITY): Payer: 59

## 2015-01-04 DIAGNOSIS — I252 Old myocardial infarction: Secondary | ICD-10-CM | POA: Insufficient documentation

## 2015-01-04 DIAGNOSIS — Z951 Presence of aortocoronary bypass graft: Secondary | ICD-10-CM

## 2015-01-04 LAB — GLUCOSE, CAPILLARY
GLUCOSE-CAPILLARY: 107 mg/dL — AB (ref 70–99)
GLUCOSE-CAPILLARY: 121 mg/dL — AB (ref 70–99)
GLUCOSE-CAPILLARY: 122 mg/dL — AB (ref 70–99)
GLUCOSE-CAPILLARY: 127 mg/dL — AB (ref 70–99)
Glucose-Capillary: 110 mg/dL — ABNORMAL HIGH (ref 70–99)
Glucose-Capillary: 113 mg/dL — ABNORMAL HIGH (ref 70–99)
Glucose-Capillary: 116 mg/dL — ABNORMAL HIGH (ref 70–99)
Glucose-Capillary: 123 mg/dL — ABNORMAL HIGH (ref 70–99)
Glucose-Capillary: 125 mg/dL — ABNORMAL HIGH (ref 70–99)
Glucose-Capillary: 129 mg/dL — ABNORMAL HIGH (ref 70–99)
Glucose-Capillary: 127 mg/dL — ABNORMAL HIGH (ref 70–99)
Glucose-Capillary: 93 mg/dL (ref 70–99)

## 2015-01-04 LAB — BASIC METABOLIC PANEL
Anion gap: 7 (ref 5–15)
BUN: 11 mg/dL (ref 6–20)
CO2: 23 mmol/L (ref 22–32)
Calcium: 8.8 mg/dL — ABNORMAL LOW (ref 8.9–10.3)
Chloride: 106 mmol/L (ref 101–111)
Creatinine, Ser: 0.9 mg/dL (ref 0.44–1.00)
GFR calc Af Amer: >60 mL/min (ref >60)
GFR calc non Af Amer: >60 mL/min (ref >60)
Glucose, Bld: 125 mg/dL — ABNORMAL HIGH (ref 70–99)
Potassium: 4.1 mmol/L (ref 3.5–5.1)
Sodium: 136 mmol/L (ref 135–145)

## 2015-01-04 LAB — CBC
HCT: 25.4 % — ABNORMAL LOW (ref 36.0–46.0)
HCT: 26 % — ABNORMAL LOW (ref 36.0–46.0)
HEMOGLOBIN: 8.2 g/dL — AB (ref 12.0–15.0)
Hemoglobin: 8.1 g/dL — ABNORMAL LOW (ref 12.0–15.0)
MCH: 25.6 pg — ABNORMAL LOW (ref 26.0–34.0)
MCH: 25.9 pg — ABNORMAL LOW (ref 26.0–34.0)
MCHC: 31.9 g/dL (ref 30.0–36.0)
MCHC: 31.5 g/dL (ref 30.0–36.0)
MCV: 80.4 fL (ref 78.0–100.0)
MCV: 82.3 fL (ref 78.0–100.0)
Platelets: 151 10*3/uL (ref 150–400)
Platelets: 176 10*3/uL (ref 150–400)
RBC: 3.16 MIL/uL — ABNORMAL LOW (ref 3.87–5.11)
RBC: 3.16 MIL/uL — AB (ref 3.87–5.11)
RDW: 19.2 % — ABNORMAL HIGH (ref 11.5–15.5)
RDW: 19.1 % — ABNORMAL HIGH (ref 11.5–15.5)
WBC: 10 10*3/uL (ref 4.0–10.5)
WBC: 13.5 10*3/uL — ABNORMAL HIGH (ref 4.0–10.5)

## 2015-01-04 LAB — POCT I-STAT, CHEM 8
BUN: 16 mg/dL (ref 6–20)
CHLORIDE: 100 mmol/L — AB (ref 101–111)
Calcium, Ion: 1.3 mmol/L — ABNORMAL HIGH (ref 1.12–1.23)
Creatinine, Ser: 1.1 mg/dL — ABNORMAL HIGH (ref 0.44–1.00)
GLUCOSE: 115 mg/dL — AB (ref 70–99)
HEMATOCRIT: 27 % — AB (ref 36.0–46.0)
Hemoglobin: 9.2 g/dL — ABNORMAL LOW (ref 12.0–15.0)
POTASSIUM: 4 mmol/L (ref 3.5–5.1)
Sodium: 138 mmol/L (ref 135–145)
TCO2: 22 mmol/L (ref 0–100)

## 2015-01-04 LAB — CREATININE, SERUM
CREATININE: 1.2 mg/dL — AB (ref 0.44–1.00)
GFR calc non Af Amer: 50 mL/min — ABNORMAL LOW (ref >60)
GFR, EST AFRICAN AMERICAN: 57 mL/min — AB (ref >60)

## 2015-01-04 LAB — MAGNESIUM: Magnesium: 2 mg/dL (ref 1.7–2.4)

## 2015-01-04 MED ORDER — INSULIN ASPART 100 UNIT/ML ~~LOC~~ SOLN
0.0000 [IU] | SUBCUTANEOUS | Status: DC
  Administered 2015-01-04 – 2015-01-05 (×5): 2 [IU] via SUBCUTANEOUS

## 2015-01-04 MED ORDER — ENOXAPARIN SODIUM 30 MG/0.3ML ~~LOC~~ SOLN
30.0000 mg | SUBCUTANEOUS | Status: DC
  Administered 2015-01-04 – 2015-01-05 (×2): 30 mg via SUBCUTANEOUS
  Filled 2015-01-04 (×2): qty 0.3

## 2015-01-04 MED ORDER — INSULIN ASPART 100 UNIT/ML ~~LOC~~ SOLN: 0.0000 [IU] | SUBCUTANEOUS | Status: DC

## 2015-01-04 MED ORDER — FUROSEMIDE 10 MG/ML IJ SOLN
40.0000 mg | Freq: Once | INTRAMUSCULAR | Status: AC
  Administered 2015-01-04: 40 mg via INTRAVENOUS
  Filled 2015-01-04: qty 4

## 2015-01-04 NOTE — Progress Notes (Signed)
Patient ID: Sheri Hernandez, female   DOB: May 26, 1956, 59 y.o.   MRN: [id number] TCTS DAILY ICU PROGRESS NOTE                   [address]            [place] [address]          [phone]   2 Days Post-Op Procedure(s) (LRB): CORONARY ARTERY BYPASS GRAFTING (CABG)TIMES TWO USING RIGHT GREATER SAPHENOUS LEG VEIN HARVESTED ENDOSCOPICALLY (N/A)  Total Length of Stay:  LOS: 2 days   Subjective: Much more comfortable this am  Objective: Vital signs in last 24 hours: Temp:  [97.5 F (36.4 C)-102 F (38.9 C)] 97.5 F (36.4 C) (05/01 0754) Pulse Rate:  [88-128] 106 (05/01 0900) Cardiac Rhythm:  [-] Sinus tachycardia (05/01 0800) Resp:  [0-34] 23 (05/01 0900) BP: (84-136)/(55-81) 104/63 mmHg (05/01 0800) SpO2:  [76 %-100 %] 95 % (05/01 0900) Arterial Line BP: (84-163)/(45-91) 163/91 mmHg (05/01 0345) FiO2 (%):  [40 %] 40 % (04/30 1040) Weight:  [192 lb 14.4 oz (87.5 kg)] 192 lb 14.4 oz (87.5 kg) (05/01 0430)  Filed Weights   01/02/15 1134 01/03/15 0230 01/04/15 0430  Weight: 180 lb (81.647 kg) 191 lb 9.3 oz (86.9 kg) 192 lb 14.4 oz (87.5 kg)    Weight change: 12 lb 14.4 oz (5.853 kg)   Hemodynamic parameters for last 24 hours: PAP: (21-28)/(9-16) 24/11 mmHg CO:  [3.2 L/min-6.2 L/min] 3.2 L/min CI:  [1.7 L/min/m2-3.1 L/min/m2] 2 L/min/m2  Intake/Output from previous day: 04/30 0701 - 05/01 0700 In: 2798.8 [P.O.:720; I.V.:1598.8; NG/GT:30; IV Piggyback:450] Out: 1870 [JJKKX:3818; Emesis/NG output:200; Drains:5; Chest Tube:270]  Intake/Output this shift: Total I/O In: 60 [I.V.:60] Out: 120 [Urine:120]  Current Meds: Scheduled Meds: . acetaminophen  1,000 mg Oral 4 times per day   Or  . acetaminophen (TYLENOL) oral liquid 160 mg/5 mL  1,000 mg Per Tube 4 times per day  . amiodarone  200 mg Oral Q12H   Followed by  . [START ON 01/10/2015] amiodarone  200 mg Oral Daily  . antiseptic oral rinse  7 mL Mouth Rinse BID  . aspirin EC  325 mg Oral Daily   Or  . aspirin  324 mg Per Tube Daily  . atorvastatin  80 mg Oral q1800  . bisacodyl  10 mg Oral Daily   Or  . bisacodyl  10 mg Rectal Daily  . cefUROXime (ZINACEF)  IV  1.5 g Intravenous Q12H  . docusate sodium  200 mg Oral Daily  . insulin aspart  0-24 Units Subcutaneous 6 times per day  . levothyroxine  137 mcg Oral QAC breakfast  . metoprolol tartrate  12.5 mg Oral BID   Or  . metoprolol tartrate  12.5 mg Per Tube BID  . [START ON 01/05/2015] pantoprazole  40 mg Oral Daily  . sodium chloride  3 mL Intravenous Q12H   Continuous Infusions: . sodium chloride Stopped (01/03/15 1600)  . sodium chloride    . sodium chloride Stopped (01/03/15 1600)  . amiodarone Stopped (01/03/15 1600)  . dexmedetomidine Stopped (01/03/15 1130)  . DOPamine Stopped (01/03/15 1830)  . lactated ringers Stopped (01/04/15 0800)  . lactated ringers Stopped (01/03/15 1800)  . nitroGLYCERIN Stopped (01/03/15 0700)  . norepinephrine (LEVOPHED) Adult infusion Stopped (01/03/15 0415)  . phenylephrine (NEO-SYNEPHRINE) Adult infusion Stopped (01/04/15 0200)   PRN Meds:.sodium chloride, albumin human, metoprolol, midazolam, morphine injection, ondansetron (ZOFRAN) IV, oxyCODONE, sodium chloride, traMADol  General appearance: alert, cooperative and no distress Neurologic: intact Heart: regular rate and rhythm, S1, S2 normal, no murmur, click, rub or gallop Lungs: diminished breath sounds bibasilar Abdomen: soft, non-tender; bowel sounds normal; no masses,  no organomegaly Extremities: extremities normal, atraumatic, no cyanosis or edema and Homans sign is negative, no sign of DVT Wound: sternum stable  Lab Results: CBC: Recent Labs  01/03/15 1545 01/04/15 0440  WBC 12.4* 10.0  HGB 8.3* 8.1*  HCT 25.8* 25.4*  PLT 179 151   BMET:  Recent Labs  01/03/15 1545 01/04/15 0439  NA 137 136  K 3.7 4.1  CL 105 106  CO2 23 23  GLUCOSE 124* 125*  BUN 12 11  CREATININE 0.94 0.90  CALCIUM 8.4 8.8*      PT/INR:  Recent Labs  01/03/15 0210  LABPROT 19.4*  INR 1.62*   Radiology:  Dg Chest Port 1 View  01/04/2015   CLINICAL DATA:  Postop from CABG.  Recent extubation.  EXAM: PORTABLE CHEST - 1 VIEW  COMPARISON:  01/03/2015  FINDINGS: Endotracheal tube, nasogastric tube, and Swan-Ganz catheter have been removed. Right jugular Cordis and mediastinal drain remain in place. No pneumothorax visualized.  Increased bibasilar atelectasis seen as well as small left pleural effusion. Cardiomegaly remains stable.  IMPRESSION: Increased bibasilar atelectasis and small left pleural effusion following extubation. No pneumothorax visualized.   Electronically Signed   By: Earle Gell M.D.   On: 01/04/2015 09:21   Assessment/Plan: S/P Procedure(s) (LRB): CORONARY ARTERY BYPASS GRAFTING (CABG)TIMES TWO USING RIGHT GREATER SAPHENOUS LEG VEIN HARVESTED ENDOSCOPICALLY (N/A) Mobilize Diuresis See progression orders Expected Acute  Blood - loss Anemia Renal function stable D/c chest tube and foley    Sheri Hernandez 01/04/2015 10:14 AM

## 2015-01-04 NOTE — Anesthesia Postprocedure Evaluation (Signed)
  Anesthesia Post-op Note  Patient: Sheri Hernandez  Procedure(s) Performed: Procedure(s): CORONARY ARTERY BYPASS GRAFTING (CABG)TIMES TWO USING RIGHT GREATER SAPHENOUS LEG VEIN HARVESTED ENDOSCOPICALLY (N/A)  Patient Location: SICU  Anesthesia Type:General  Level of Consciousness: awake, alert , oriented and patient cooperative  Airway and Oxygen Therapy: Patient Spontanous Breathing and Patient connected to nasal cannula oxygen  Post-op Pain: mild  Post-op Assessment: Post-op Vital signs reviewed, Patient's Cardiovascular Status Stable, Respiratory Function Stable, Patent Airway, No signs of Nausea or vomiting and Pain level controlled  Post-op Vital Signs: Reviewed and stable  Last Vitals:  Filed Vitals:   01/04/15 1136  BP:   Pulse:   Temp: 36.7 C  Resp:     Complications: No apparent anesthesia complications, pt very grateful for care

## 2015-01-05 ENCOUNTER — Inpatient Hospital Stay (HOSPITAL_COMMUNITY): Payer: 59

## 2015-01-05 LAB — CBC
HCT: 25.4 % — ABNORMAL LOW (ref 36.0–46.0)
Hemoglobin: 8.1 g/dL — ABNORMAL LOW (ref 12.0–15.0)
MCH: 26 pg (ref 26.0–34.0)
MCHC: 31.9 g/dL (ref 30.0–36.0)
MCV: 81.7 fL (ref 78.0–100.0)
Platelets: 176 10*3/uL (ref 150–400)
RBC: 3.11 MIL/uL — ABNORMAL LOW (ref 3.87–5.11)
RDW: 19 % — ABNORMAL HIGH (ref 11.5–15.5)
WBC: 12.3 10*3/uL — ABNORMAL HIGH (ref 4.0–10.5)

## 2015-01-05 LAB — GLUCOSE, CAPILLARY
GLUCOSE-CAPILLARY: 104 mg/dL — AB (ref 70–99)
GLUCOSE-CAPILLARY: 106 mg/dL — AB (ref 70–99)
GLUCOSE-CAPILLARY: 116 mg/dL — AB (ref 70–99)
GLUCOSE-CAPILLARY: 118 mg/dL — AB (ref 70–99)
GLUCOSE-CAPILLARY: 130 mg/dL — AB (ref 70–99)
Glucose-Capillary: 115 mg/dL — ABNORMAL HIGH (ref 70–99)

## 2015-01-05 LAB — BASIC METABOLIC PANEL
Anion gap: 8 (ref 5–15)
BUN: 15 mg/dL (ref 6–20)
CO2: 25 mmol/L (ref 22–32)
Calcium: 8.7 mg/dL — ABNORMAL LOW (ref 8.9–10.3)
Chloride: 99 mmol/L — ABNORMAL LOW (ref 101–111)
Creatinine, Ser: 0.98 mg/dL (ref 0.44–1.00)
GFR calc Af Amer: >60 mL/min (ref >60)
GFR calc non Af Amer: >60 mL/min (ref >60)
Glucose, Bld: 115 mg/dL — ABNORMAL HIGH (ref 70–99)
Potassium: 4.1 mmol/L (ref 3.5–5.1)
Sodium: 132 mmol/L — ABNORMAL LOW (ref 135–145)

## 2015-01-05 LAB — HEMOGLOBIN A1C
Hgb A1c MFr Bld: 6.1 % — ABNORMAL HIGH (ref 4.8–5.6)
Mean Plasma Glucose: 128 mg/dL

## 2015-01-05 LAB — POCT ACTIVATED CLOTTING TIME: Activated Clotting Time: 429 seconds

## 2015-01-05 MED ORDER — ONDANSETRON HCL 4 MG PO TABS: 4.0000 mg | ORAL_TABLET | Freq: Four times a day (QID) | ORAL | Status: DC | PRN

## 2015-01-05 MED ORDER — LORAZEPAM 1 MG PO TABS
1.0000 mg | ORAL_TABLET | Freq: Three times a day (TID) | ORAL | Status: DC | PRN
  Administered 2015-01-05 – 2015-01-09 (×9): 1 mg via ORAL
  Filled 2015-01-05 (×11): qty 1

## 2015-01-05 MED ORDER — SODIUM CHLORIDE 0.9 % IV SOLN: 250.0000 mL | INTRAVENOUS | Status: DC | PRN

## 2015-01-05 MED ORDER — ONDANSETRON HCL 4 MG/2ML IJ SOLN: 4.0000 mg | Freq: Four times a day (QID) | INTRAMUSCULAR | Status: DC | PRN

## 2015-01-05 MED ORDER — KETOROLAC TROMETHAMINE 30 MG/ML IJ SOLN
30.0000 mg | Freq: Four times a day (QID) | INTRAMUSCULAR | Status: DC
  Administered 2015-01-05: 30 mg via INTRAVENOUS
  Filled 2015-01-05 (×4): qty 1

## 2015-01-05 MED ORDER — TRAMADOL HCL 50 MG PO TABS
50.0000 mg | ORAL_TABLET | ORAL | Status: DC | PRN
  Administered 2015-01-07 – 2015-01-09 (×2): 50 mg via ORAL
  Filled 2015-01-05 (×2): qty 1

## 2015-01-05 MED ORDER — OXYCODONE HCL 5 MG PO TABS
5.0000 mg | ORAL_TABLET | ORAL | Status: DC | PRN
  Administered 2015-01-05 – 2015-01-08 (×8): 10 mg via ORAL
  Filled 2015-01-05 (×8): qty 2

## 2015-01-05 MED ORDER — SODIUM CHLORIDE 0.9 % IV SOLN
Freq: Once | INTRAVENOUS | Status: AC
  Administered 2015-01-05: 15:00:00 via INTRAVENOUS

## 2015-01-05 MED ORDER — MOVING RIGHT ALONG BOOK
Freq: Once | Status: AC
  Administered 2015-01-05: 19:00:00
  Filled 2015-01-05: qty 1

## 2015-01-05 MED ORDER — ASPIRIN EC 325 MG PO TBEC
325.0000 mg | DELAYED_RELEASE_TABLET | Freq: Every day | ORAL | Status: DC
  Administered 2015-01-06 – 2015-01-09 (×4): 325 mg via ORAL
  Filled 2015-01-05 (×4): qty 1

## 2015-01-05 MED ORDER — SODIUM CHLORIDE 0.9 % IJ SOLN: 3.0000 mL | INTRAMUSCULAR | Status: DC | PRN

## 2015-01-05 MED ORDER — MAGNESIUM HYDROXIDE 400 MG/5ML PO SUSP: 30.0000 mL | Freq: Every day | ORAL | Status: DC | PRN

## 2015-01-05 MED ORDER — GUAIFENESIN ER 600 MG PO TB12
600.0000 mg | ORAL_TABLET | Freq: Two times a day (BID) | ORAL | Status: DC | PRN
  Filled 2015-01-05: qty 1

## 2015-01-05 MED ORDER — INSULIN ASPART 100 UNIT/ML ~~LOC~~ SOLN
0.0000 [IU] | Freq: Three times a day (TID) | SUBCUTANEOUS | Status: DC
  Administered 2015-01-06 – 2015-01-07 (×2): 2 [IU] via SUBCUTANEOUS

## 2015-01-05 MED ORDER — METOPROLOL TARTRATE 12.5 MG HALF TABLET
12.5000 mg | ORAL_TABLET | Freq: Two times a day (BID) | ORAL | Status: DC
  Administered 2015-01-06 – 2015-01-09 (×7): 12.5 mg via ORAL
  Filled 2015-01-05 (×10): qty 1

## 2015-01-05 MED ORDER — BISACODYL 5 MG PO TBEC
10.0000 mg | DELAYED_RELEASE_TABLET | Freq: Every day | ORAL | Status: DC | PRN
  Filled 2015-01-05: qty 2

## 2015-01-05 MED ORDER — SODIUM CHLORIDE 0.9 % IJ SOLN
3.0000 mL | Freq: Two times a day (BID) | INTRAMUSCULAR | Status: DC
  Administered 2015-01-05 – 2015-01-08 (×7): 3 mL via INTRAVENOUS

## 2015-01-05 MED ORDER — BISACODYL 10 MG RE SUPP
10.0000 mg | Freq: Every day | RECTAL | Status: DC | PRN
Start: 2015-01-05 — End: 2015-01-09

## 2015-01-05 MED ORDER — DOCUSATE SODIUM 100 MG PO CAPS
200.0000 mg | ORAL_CAPSULE | Freq: Every day | ORAL | Status: DC
  Administered 2015-01-07 – 2015-01-08 (×2): 200 mg via ORAL
  Filled 2015-01-05 (×5): qty 2

## 2015-01-05 MED ORDER — HYDROCHLOROTHIAZIDE 12.5 MG PO CAPS
12.5000 mg | ORAL_CAPSULE | Freq: Every day | ORAL | Status: DC
  Administered 2015-01-06: 12.5 mg via ORAL
  Filled 2015-01-05 (×2): qty 1

## 2015-01-05 MED FILL — Lidocaine HCl IV Inj 20 MG/ML: INTRAVENOUS | Qty: 5 | Status: AC

## 2015-01-05 MED FILL — Electrolyte-R (PH 7.4) Solution: INTRAVENOUS | Qty: 3000 | Status: AC

## 2015-01-05 MED FILL — Sodium Chloride IV Soln 0.9%: INTRAVENOUS | Qty: 2000 | Status: AC

## 2015-01-05 MED FILL — Sodium Chloride IV Soln 0.9%: INTRAVENOUS | Qty: 50 | Status: AC

## 2015-01-05 MED FILL — Mannitol IV Soln 20%: INTRAVENOUS | Qty: 500 | Status: AC

## 2015-01-05 MED FILL — Sodium Bicarbonate IV Soln 8.4%: INTRAVENOUS | Qty: 50 | Status: AC

## 2015-01-05 MED FILL — Heparin Sodium (Porcine) Inj 1000 Unit/ML: INTRAMUSCULAR | Qty: 20 | Status: AC

## 2015-01-05 NOTE — Progress Notes (Addendum)
TCTS DAILY ICU PROGRESS NOTE                   [address]            [place], [address]          [phone]   3 Days Post-Op Procedure(s) (LRB): CORONARY ARTERY BYPASS GRAFTING (CABG)TIMES TWO USING RIGHT GREATER SAPHENOUS LEG VEIN HARVESTED ENDOSCOPICALLY (N/A)  Total Length of Stay:  LOS: 3 days   Subjective: OOB in chair. C/o sore throat when she swallows and a lot of incisional pain. Feels the pain meds are giving her visual hallucinations.    Objective: Vital signs in last 24 hours: Temp:  [97.7 F (36.5 C)-99 F (37.2 C)] 99 F (37.2 C) (05/02 0816) Pulse Rate:  [100-130] 105 (05/02 0800) Cardiac Rhythm:  [-] Sinus tachycardia (05/02 0400) Resp:  [11-26] 12 (05/02 0800) BP: (95-140)/(56-90) 110/63 mmHg (05/02 0800) SpO2:  [89 %-100 %] 98 % (05/02 0800) Weight:  [192 lb 10.9 oz (87.4 kg)] 192 lb 10.9 oz (87.4 kg) (05/02 0500)  Filed Weights   01/03/15 0230 01/04/15 0430 01/05/15 0500  Weight: 191 lb 9.3 oz (86.9 kg) 192 lb 14.4 oz (87.5 kg) 192 lb 10.9 oz (87.4 kg)    Weight change: -3.5 oz (-0.1 kg)   Hemodynamic parameters for last 24 hours:    Intake/Output from previous day: 05/01 0701 - 05/02 0700 In: 1870 [P.O.:1320; I.V.:500; IV Piggyback:50] Out: 2580 [Urine:2570; Chest Tube:10]  Intake/Output this shift:    Current Meds: Scheduled Meds: . acetaminophen  1,000 mg Oral 4 times per day   Or  . acetaminophen (TYLENOL) oral liquid 160 mg/5 mL  1,000 mg Per Tube 4 times per day  . amiodarone  200 mg Oral Q12H   Followed by  . [START ON 01/10/2015] amiodarone  200 mg Oral Daily  . antiseptic oral rinse  7 mL Mouth Rinse BID  . aspirin EC  325 mg Oral Daily   Or  . aspirin  324 mg Per Tube Daily  . atorvastatin  80 mg Oral q1800  . bisacodyl  10 mg Oral Daily   Or  . bisacodyl  10 mg Rectal Daily  . docusate sodium  200 mg Oral Daily  . enoxaparin (LOVENOX) injection  30 mg Subcutaneous Q24H  . insulin aspart  0-24 Units  Subcutaneous 6 times per day  . levothyroxine  137 mcg Oral QAC breakfast  . metoprolol tartrate  12.5 mg Oral BID   Or  . metoprolol tartrate  12.5 mg Per Tube BID  . pantoprazole  40 mg Oral Daily  . sodium chloride  3 mL Intravenous Q12H   Continuous Infusions: . sodium chloride 250 mL (01/05/15 0700)  . amiodarone Stopped (01/03/15 1600)  . dexmedetomidine Stopped (01/03/15 1130)  . DOPamine Stopped (01/03/15 1830)  . lactated ringers Stopped (01/03/15 1800)  . nitroGLYCERIN Stopped (01/03/15 0700)  . norepinephrine (LEVOPHED) Adult infusion Stopped (01/03/15 0415)  . phenylephrine (NEO-SYNEPHRINE) Adult infusion Stopped (01/04/15 0200)   PRN Meds:.metoprolol, morphine injection, ondansetron (ZOFRAN) IV, oxyCODONE, sodium chloride, traMADol  Physical Exam: General appearance: alert, cooperative and no distress Heart: Tachy 110s Lungs: Poor inspiratory effort, diminished BS in bases but no rales or rhonchi Extremities: Mild LE edema bilaterally Wound: Dressed and dry   Lab Results: CBC: Recent Labs  01/04/15 1710 01/04/15 1714 01/05/15 0410  WBC 13.5*  --  12.3*  HGB 8.2* 9.2* 8.1*  HCT 26.0* 27.0* 25.4*  PLT 176  --  176   BMET:  Recent Labs  01/04/15 0439  01/04/15 1714 01/05/15 0410  NA 136  --  138 132*  K 4.1  --  4.0 4.1  CL 106  --  100* 99*  CO2 23  --   --  25  GLUCOSE 125*  --  115* 115*  BUN 11  --  16 15  CREATININE 0.90  < > 1.10* 0.98  CALCIUM 8.8*  --   --  8.7*  < > = values in this interval not displayed.  PT/INR:  Recent Labs  01/03/15 0210  LABPROT 19.4*  INR 1.62*   Radiology: Dg Chest Port 1 View  01/05/2015   CLINICAL DATA:  Postop from CABG.  EXAM:  FINDINGS: Right IJ sheath in stable position. Mediastinum and hilar structures are stable. Prior CABG. Stable cardiomegaly. Persistent bibasilar infiltrates. Bibasilar infiltrates could be related to pneumonia and/or pulmonary edema. Persistent bibasilar atelectasis.  Persistent small pleural effusions.  IMPRESSION: 1. Right IJ sheath in stable position. 2. Prior CABG. Stable cardiomegaly. 3. Persistent bibasilar infiltrates with persistent small pleural effusions. These changes could be related to pneumonia and/or pulmonary edema. 4. Persistent bibasilar atelectasis .  Assessment/Plan: S/P Procedure(s) (LRB): CORONARY ARTERY BYPASS GRAFTING (CABG)TIMES TWO USING RIGHT GREATER SAPHENOUS LEG VEIN HARVESTED ENDOSCOPICALLY (N/A)  CV- Tachy with HRs 110s, but SBPs low normal.  Will leave Lopressor at 12.'5mg'$  bid for now and watch. May be able to titrate once BPs improve.  Expected post op blood loss anemia- H/H generally stable.  Anxiety/pain control- will resume prn Ativan, add Toradol for a few doses to try to get a handle on her pain and minimize narcotics.  Pulm- Atelectasis/edema on CXR.  Encouraged increased pulm toilet. Will add flutter valve.  Needs to mobilize- discussed with pt as she has been refusing.  Mild volume overload- will watch and diurese when able.  Possibly ready for transfer to stepdown later today.  COLLINS,GINA H 01/05/2015 9:01 AM  I have seen and examined Sheri Hernandez and agree with the above assessment  and plan.  [name] MD Beeper [phone] Office [phone] 01/05/2015 9:56 AM

## 2015-01-05 NOTE — Progress Notes (Signed)
BP low on arrival to 2W23, 83/44 via dynamap, 82/54 manual. Pt states that she is lightheaded and feels "funny". Suzzanne Cloud, PA for Dr. Servando Snare notified. 500 ml NS bolus given per order. BP after bolus 98/47. Pt states that she is feeling "a little better". Will hold evening dose of metoprolol per order.

## 2015-01-05 NOTE — Progress Notes (Signed)
Pt refused to ambulate in hallway, states "legs are too stiff today". PRN pain med given, see MAR. Pt education provided about the importance of ambulation and progression of care.

## 2015-01-05 NOTE — Evaluation (Signed)
Physical Therapy Evaluation Patient Details Name: Sheri Hernandez MRN: [id number] DOB: [birth year] Today's Date: 01/05/2015   History of Present Illness  Adm 4/29 with chest pain; +NSTEMI; 4/30 CABG x 2 PMHx- metatstatic renal Ca with nephrectomy, anxiety  Clinical Impression  Patient is s/p above surgery resulting in functional limitations due to the deficits listed below (see PT Problem List). Pt de-saturated on 3-4L of O2 while walking and required incr time and incr to 6L by RN to recover. Anxious re: pain and needs much encouragement to move. Patient will benefit from skilled PT to increase their independence and safety with mobility to allow discharge to the venue listed below.       Follow Up Recommendations Home health PT;Supervision for mobility/OOB    Equipment Recommendations  Rolling walker with 5" wheels;3in1 (PT) (or toilet riser (TBA))    Recommendations for Other Services OT consult     Precautions / Restrictions Precautions Precautions: Sternal;Fall Precaution Comments: educated on sternal precautions; required vc to adhere       Mobility  Bed Mobility Overal bed mobility: Needs Assistance Bed Mobility: Sit to Sidelying;Rolling Rolling: Supervision       Sit to sidelying: Min assist General bed mobility comments: educated on technique 2/2 sternal precuations; rolled side to supine  Transfers Overall transfer level: Needs assistance Equipment used: Pushed w/c Transfers: Sit to/from Stand Sit to Stand: Mod assist         General transfer comment: x 2 from recliner; vc for set-up and technique  Ambulation/Gait Ambulation/Gait assistance: Min assist;+2 safety/equipment Ambulation Distance (Feet): 40 Feet (seated rest; 40 ft) Assistive device:  (push w/c ) Gait Pattern/deviations: Step-through pattern;Decreased stride length;Drifts right/left;Trunk flexed Gait velocity: decr   General Gait Details: flexed upper trunk due to pain; able to reverse with  max cues  Stairs            Wheelchair Mobility    Modified Rankin (Stroke Patients Only)       Balance Overall balance assessment: Needs assistance   Sitting balance-Leahy Scale: Fair       Standing balance-Leahy Scale: Poor Standing balance comment: minguard primarily due to woozy from pain meds                             Pertinent Vitals/Pain HR 100-122 SaO2 100% 2L at rest   Walking 2L 85% (once waveform recovered)   Seated rest up to 4L and gradual incr to 97%   Walking 4L 90%; RN returned to 2L   Supine 2L 85%, incr to 4L and RN in to assess as not incr >86%; RN incr to 6L with recovery to 99%     Pain Assessment: 0-10 Pain Score: 5  Pain Location: chest Pain Intervention(s): Limited activity within patient's tolerance;Monitored during session;Premedicated before session;Repositioned    Home Living Family/patient expects to be discharged to:: Private residence Living Arrangements: Spouse/significant other;Children (5 yo son (unemployed)) Available Help at Discharge: Family;Available 24 hours/day Type of Home: House Home Access: Stairs to enter Entrance Stairs-Rails: None Entrance Stairs-Number of Steps: 3 Home Layout: Two level;1/2 bath on main level;Able to live on main level with bedroom/bathroom (has been sleeping on couch) Home Equipment: Cane - quad;Tub bench (tub/shower with curtain (upstairs--pt has been using)) Additional Comments: sleeps downstairs due to husband's sleep schedule/work schedule    Prior Function Level of Independence: Independent  Hand Dominance        Extremity/Trunk Assessment   Upper Extremity Assessment: Overall WFL for tasks assessed (cues to maintain sternal precautions)           Lower Extremity Assessment: Overall WFL for tasks assessed (+edema; painful ROM RLE due to incision)      Cervical / Trunk Assessment: Kyphotic  Communication   Communication: No difficulties   Cognition Arousal/Alertness: Lethargic;Suspect due to medications Behavior During Therapy: Anxious Overall Cognitive Status: Within Functional Limits for tasks assessed                      General Comments      Exercises        Assessment/Plan    PT Assessment Patient needs continued PT services  PT Diagnosis Difficulty walking;Acute pain   PT Problem List Decreased activity tolerance;Decreased balance;Decreased mobility;Decreased knowledge of use of DME;Decreased safety awareness;Decreased knowledge of precautions;Cardiopulmonary status limiting activity;Pain  PT Treatment Interventions DME instruction;Gait training;Stair training;Functional mobility training;Therapeutic activities;Balance training;Cognitive remediation;Patient/family education   PT Goals (Current goals can be found in the Care Plan section) Acute Rehab PT Goals Patient Stated Goal: return to volunteering at [employer] PT Goal Formulation: With patient Time For Goal Achievement: 01/12/15 Potential to Achieve Goals: Good    Frequency Min 3X/week   Barriers to discharge        Co-evaluation               End of Session Equipment Utilized During Treatment: Oxygen Activity Tolerance: Patient limited by lethargy;Treatment limited secondary to medical complications (Comment) (decr SaO2) Patient left: in bed;with call bell/phone within reach;with nursing/sitter in room Nurse Communication: Mobility status;Other (comment) (SaO2 low despite incr to 4L)         Time: 2119-4174 PT Time Calculation (min) (ACUTE ONLY): 52 min   Charges:   PT Evaluation $Initial PT Evaluation Tier I: 1 Procedure PT Treatments $Gait Training: 23-37 mins $Therapeutic Activity: 8-22 mins   PT G Codes:        Sheri Hernandez 2015/01/26, 12:03 PM Pager [phone]

## 2015-01-06 ENCOUNTER — Inpatient Hospital Stay (HOSPITAL_COMMUNITY): Payer: 59

## 2015-01-06 ENCOUNTER — Encounter (HOSPITAL_COMMUNITY): Payer: Self-pay | Admitting: Cardiothoracic Surgery

## 2015-01-06 LAB — CBC
HCT: 25.1 % — ABNORMAL LOW (ref 36.0–46.0)
Hemoglobin: 8 g/dL — ABNORMAL LOW (ref 12.0–15.0)
MCH: 26.4 pg (ref 26.0–34.0)
MCHC: 31.9 g/dL (ref 30.0–36.0)
MCV: 82.8 fL (ref 78.0–100.0)
Platelets: 176 10*3/uL (ref 150–400)
RBC: 3.03 MIL/uL — ABNORMAL LOW (ref 3.87–5.11)
RDW: 18.6 % — ABNORMAL HIGH (ref 11.5–15.5)
WBC: 8.1 10*3/uL (ref 4.0–10.5)

## 2015-01-06 LAB — GLUCOSE, CAPILLARY
Glucose-Capillary: 93 mg/dL (ref 70–99)
Glucose-Capillary: 86 mg/dL (ref 70–99)
Glucose-Capillary: 91 mg/dL (ref 70–99)
Glucose-Capillary: 145 mg/dL — ABNORMAL HIGH (ref 70–99)

## 2015-01-06 LAB — BASIC METABOLIC PANEL
Anion gap: 11 (ref 5–15)
BUN: 21 mg/dL — ABNORMAL HIGH (ref 6–20)
CO2: 24 mmol/L (ref 22–32)
Calcium: 9 mg/dL (ref 8.9–10.3)
Chloride: 100 mmol/L — ABNORMAL LOW (ref 101–111)
Creatinine, Ser: 1.05 mg/dL — ABNORMAL HIGH (ref 0.44–1.00)
GFR calc Af Amer: >60 mL/min (ref >60)
GFR calc non Af Amer: 58 mL/min — ABNORMAL LOW (ref >60)
Glucose, Bld: 103 mg/dL — ABNORMAL HIGH (ref 70–99)
Potassium: 3.8 mmol/L (ref 3.5–5.1)
Sodium: 135 mmol/L (ref 135–145)

## 2015-01-06 LAB — TYPE AND SCREEN
ABO/RH(D): O POS
Antibody Screen: NEGATIVE
UNIT DIVISION: 0
UNIT DIVISION: 0
Unit division: 0
Unit division: 0
Unit division: 0
Unit division: 0

## 2015-01-06 LAB — BLOOD PRODUCT ORDER (VERBAL) VERIFICATION

## 2015-01-06 MED FILL — Heparin Sodium (Porcine) Inj 1000 Unit/ML: INTRAMUSCULAR | Qty: 30 | Status: AC

## 2015-01-06 MED FILL — Magnesium Sulfate Inj 50%: INTRAMUSCULAR | Qty: 2 | Status: AC

## 2015-01-06 MED FILL — Potassium Chloride Inj 2 mEq/ML: INTRAVENOUS | Qty: 40 | Status: AC

## 2015-01-06 NOTE — Progress Notes (Signed)
Pt c/o SOB when moving from bed to chair. Pt 02 sat 90% on 2L. RN increased 02 to 3L and pt began to breath better and stated SOB decrease. Pt resting in chair. RN educated on taking slow deep breaths through the nose.   Will continue to monitor.  Sheri Hernandez

## 2015-01-06 NOTE — Clinical Social Work Note (Signed)
Clinical Social Worker received referral for possible ST-SNF placement.  Chart reviewed.  PT/OT recommending home with home health.  Spoke with RN Case Manager who will follow up with patient to discuss home health needs.    CSW signing off - please re consult if social work needs arise.  Jesse Jonea Bukowski, LCSW [phone] 

## 2015-01-06 NOTE — Progress Notes (Addendum)
      [address]       [place] [address]             [phone]      4 Days Post-Op Procedure(s) (LRB): CORONARY ARTERY BYPASS GRAFTING (CABG)TIMES TWO USING RIGHT GREATER SAPHENOUS LEG VEIN HARVESTED ENDOSCOPICALLY (N/A)   Subjective:  Ms. Weldin states she had difficulty with shortness of breath last night.  However, she states that her oxygen was off due to being moved from bed to chair.  She states she is breathing better this morning.  She is ambulating with some difficulty and would like to go to SNF at discharge.  + BM  Objective: Vital signs in last 24 hours: Temp:  [97.8 F (36.6 C)-99 F (37.2 C)] 98.2 F (36.8 C) (05/03 0428) Pulse Rate:  [96-117] 109 (05/03 0428) Cardiac Rhythm:  [-] Sinus tachycardia (05/02 2015) Resp:  [13-26] 16 (05/03 0428) BP: (79-135)/(44-90) 135/66 mmHg (05/03 0428) SpO2:  [88 %-100 %] 96 % (05/03 0536) Weight:  [191 lb 5.8 oz (86.8 kg)] 191 lb 5.8 oz (86.8 kg) (05/03 0428)  Intake/Output from previous day: 05/02 0701 - 05/03 0700 In: 700 [P.O.:600; I.V.:100] Out: 1200 [Urine:1200]   General appearance: alert, cooperative and no distress Heart: regular rate and rhythm Lungs: clear to auscultation bilaterally Abdomen: soft, non-tender; bowel sounds normal; no masses,  no organomegaly Extremities: edema trace Wound: clean and dry  Lab Results:  Recent Labs  01/05/15 0410 01/06/15 0524  WBC 12.3* 8.1  HGB 8.1* 8.0*  HCT 25.4* 25.1*  PLT 176 176   BMET:  Recent Labs  01/05/15 0410 01/06/15 0524  NA 132* 135  K 4.1 3.8  CL 99* 100*  CO2 25 24  GLUCOSE 115* 103*  BUN 15 21*  CREATININE 0.98 1.05*  CALCIUM 8.7* 9.0    PT/INR: No results for input(s): LABPROT, INR in the last 72 hours. ABG    Component Value Date/Time   PHART 7.312* 01/03/2015 1554   HCO3 21.4 01/03/2015 1554   TCO2 22 01/04/2015 1714   ACIDBASEDEF 4.0* 01/03/2015 1554   O2SAT 96.0 01/03/2015 1554   CBG (last 3)   Recent  Labs  01/05/15 1705 01/05/15 2107 01/06/15 0553  GLUCAP 104* 118* 93    Assessment/Plan: S/P Procedure(s) (LRB): CORONARY ARTERY BYPASS GRAFTING (CABG)TIMES TWO USING RIGHT GREATER SAPHENOUS LEG VEIN HARVESTED ENDOSCOPICALLY (N/A)  1. CV- Sinus Tach, mild hypertension this morning- will increase Lopressor, continue Amiodarone, HCTZ  2. Pulm- wean oxygen as tolerated, continue IS 3. Renal- creatinine stable, remains hypervolemic on home HCTZ for diuresis 4. Deconditioning- patient ambulating much wants to go to SNF for more rehab at discharge 5. Dispo- patient stable, will place PT/OT consult, d/c EPW, continue current care  LOS: 4 days    BARRETT, ERIN 01/06/2015  Will d/c Maxicide due to low bp continue  Low dose beta blocker I have seen and examined Sherrilee Gilles and agree with the above assessment  and plan.  [name] MD Beeper [phone] Office [phone] 01/06/2015 5:18 PM

## 2015-01-06 NOTE — Progress Notes (Signed)
CARDIAC REHAB PHASE I   PRE:  Rate/Rhythm:   BP:  Supine: 107/59  Sitting:   Standing:    SaO2: 96% 3L  MODE:  Ambulation: 150 ft   POST:  Rate/Rhythm: 128 ST  BP:  Supine:   Sitting: 128/75  Standing:    SaO2: 95% 4L after 1 minute 0949-1025 Pt walked 150 ft on 4L with gait belt use, rolling walker and asst x2 with slow gait. Stopped twice to rest. C/o legs feeling weak during walk. Did not need to sit to rest. To [facility] after walk. To recliner with call bell. Keep as asst x 2.   Graylon Good, RN BSN  01/06/2015 10:22 AM   [address]

## 2015-01-06 NOTE — Progress Notes (Signed)
Physical Therapy Treatment Patient Details Name: Sheri Hernandez MRN: [id number] DOB: 21-Mar-1956 Today's Date: 01/06/2015    History of Present Illness Adm 4/29 with chest pain; +NSTEMI; 4/30 CABG x 2 PMHx- metatstatic renal Ca with nephrectomy, anxiety    PT Comments    Pt tolerating activity better today (and at times moving too quickly with cues to slow down!). SaO2 98% on 4L with activity; HR 125; BP 113/63. Continues to require max cues to adhere to sternal precautions.   Follow Up Recommendations  Home health PT;Supervision for mobility/OOB     Equipment Recommendations  Rolling walker with 5" wheels;3in1 (PT) (or toilet riser (TBA))    Recommendations for Other Services OT consult     Precautions / Restrictions Precautions Precautions: Sternal;Fall Precaution Comments: educated on sternal precautions; required vc to adhere     Mobility  Bed Mobility                  Transfers Overall transfer level: Needs assistance Equipment used: Rolling walker (2 wheeled);None Transfers: Sit to/from [facility] Sit to Stand: Mod assist;+2 physical assistance Stand pivot transfers: Mod assist;+2 physical assistance       General transfer comment: from recliner +2 mod assist with poor anterior wt-shift or initial hip/knee extension; from [facility] min assist  Ambulation/Gait Ambulation/Gait assistance: Min assist;+2 safety/equipment Ambulation Distance (Feet): 150 Feet Assistive device: Rolling walker (2 wheeled) (push w/c ) Gait Pattern/deviations: Step-through pattern;Decreased stride length;Trunk flexed     General Gait Details: vc for upright posture and safe use of RW; one standing rest break; moving a bit too quickly/impulsively towards end of walk and as entering room   Stairs            Wheelchair Mobility    Modified Rankin (Stroke Patients Only)       Balance     Sitting balance-Leahy Scale: Fair       Standing balance-Leahy  Scale: Poor                      Cognition Arousal/Alertness: Awake/alert Behavior During Therapy: Anxious;Impulsive Overall Cognitive Status: Impaired/Different from baseline Area of Impairment: Memory;Safety/judgement     Memory: Decreased recall of precautions;Decreased short-term memory   Safety/Judgement: Decreased awareness of safety     General Comments: required frequent vc for sternal precautions; moves quickly/impulsively    Exercises      General Comments        Pertinent Vitals/Pain Pain Assessment: Faces Faces Pain Scale: Hurts even more Pain Location: chest Pain Intervention(s): Limited activity within patient's tolerance;Monitored during session;Repositioned    Home Living                      Prior Function            PT Goals (current goals can now be found in the care plan section) Acute Rehab PT Goals Patient Stated Goal: return to volunteering at [employer] Time For Goal Achievement: 01/12/15 Progress towards PT goals: Progressing toward goals    Frequency  Min 3X/week    PT Plan Current plan remains appropriate    Co-evaluation             End of Session Equipment Utilized During Treatment: Oxygen;Gait belt Activity Tolerance: Patient tolerated treatment well Patient left: in chair;with call bell/phone within reach     Time: 8299-3716 PT Time Calculation (min) (ACUTE ONLY): 24 min  Charges:  $Gait Training: 8-22 mins $Therapeutic Activity:  8-22 mins                    G Codes:      Kaleea Penner 2015-01-21, 3:50 PM Pager [phone]

## 2015-01-07 ENCOUNTER — Encounter (HOSPITAL_COMMUNITY): Payer: Self-pay | Admitting: General Practice

## 2015-01-07 LAB — GLUCOSE, CAPILLARY
GLUCOSE-CAPILLARY: 126 mg/dL — AB (ref 70–99)
GLUCOSE-CAPILLARY: 103 mg/dL — AB (ref 70–99)
GLUCOSE-CAPILLARY: 101 mg/dL — AB (ref 70–99)
Glucose-Capillary: 89 mg/dL (ref 70–99)

## 2015-01-07 MED ORDER — FUROSEMIDE 40 MG PO TABS
40.0000 mg | ORAL_TABLET | Freq: Every day | ORAL | Status: DC
Start: 2015-01-07 — End: 2015-01-09
  Administered 2015-01-07 – 2015-01-09 (×3): 40 mg via ORAL
  Filled 2015-01-07 (×4): qty 1

## 2015-01-07 NOTE — Progress Notes (Signed)
CARDIAC REHAB PHASE I   PRE:  Rate/Rhythm: 103 ST    BP: sitting 113/70    SaO2: 100 3L  MODE:  Ambulation: 150 ft   POST:  Rate/Rhythm: 128 ST    BP: sitting 135/84     SaO2: 94-95 3L  Pt anxious and tearful on arrival. Able to stand and walk with assist x2, gait belt, RW and 3L O2. Pt groggy, closing eyes at times. Rest x1 due to dizziness and SOB. SaO2 would not register (on 3L). Able to return to room without having to sit, just very anxious, still tearful. To BSC with VSS although HR 128 ST while walking. Pt seemed to calm once sitting. Left on BSC. Pt could have walked farther today if it wasn't for her labile emotions. Will f/u tomorrow. [phone]   Josephina Shih [place] CES, ACSM 01/07/2015 11:08 AM

## 2015-01-07 NOTE — Progress Notes (Signed)
Pt ambulated 150 feet with rolling walker, RN, and NT; pt c/o feeling weak in legs and slightly dizzy during walk; pt sat in wheelchair after 75 feet to rest; pt rested for few minutes then walked remaining 75 feet back to room to chair; HR remained 100 before, during, and after ambulation; ambulated on 3L O2 [place]; call bell w/i reach; will cont. To monitor.

## 2015-01-07 NOTE — Discharge Instructions (Signed)
Activity: 1.May walk up steps °               2.No lifting more than ten pounds for four weeks.  °               3.No driving for four weeks. °               4.Stop any activity that causes chest pain, shortness of breath, dizziness, sweating or excessive weakness. °               5.Avoid straining. °               6.Continue with your breathing exercises daily. ° °Diet: Diabetic diet and Low fat, Low salt diet ° °Wound Care: May shower.  Clean wounds with mild soap and water daily. Contact the office at [phone] if any problems arise. ° °Coronary Artery Bypass Grafting, Care After °Refer to this sheet in the next few weeks. These instructions provide you with information on caring for yourself after your procedure. Your health care provider may also give you more specific instructions. Your treatment has been planned according to current medical practices, but problems sometimes occur. Call your health care provider if you have any problems or questions after your procedure. °WHAT TO EXPECT AFTER THE PROCEDURE °Recovery from surgery will be different for everyone. Some people feel well after 3 or 4 weeks, while for others it takes longer. After your procedure, it is typical to have the following: °· Nausea and a lack of appetite.   °· Constipation. °· Weakness and fatigue.   °· Depression or irritability.   °· Pain or discomfort at your incision site. °HOME CARE INSTRUCTIONS °· Take medicines only as directed by your health care provider. Do not stop taking medicines or start any new medicines without first checking with your health care provider. °· Take your pulse as directed by your health care provider. °· Perform deep breathing as directed by your health care provider. If you were given a device called an incentive spirometer, use it to practice deep breathing several times a day. Support your chest with a pillow or your arms when you take deep breaths or cough. °· Keep incision areas clean, dry, and  protected. Remove or change any bandages (dressings) only as directed by your health care provider. You may have skin adhesive strips over the incision areas. Do not take the strips off. They will fall off on their own. °· Check incision areas daily for any swelling, redness, or drainage. °· If incisions were made in your legs, do the following: °¨ Avoid crossing your legs.   °¨ Avoid sitting for long periods of time. Change positions every 30 minutes.   °¨ Elevate your legs when you are sitting. °· Wear compression stockings as directed by your health care provider. These stockings help keep blood clots from forming in your legs. °· Take showers once your health care provider approves. Until then, only take sponge baths. Pat incisions dry. Do not rub incisions with a washcloth or towel. Do not take baths, swim, or use a hot tub until your health care provider approves. °· Eat foods that are high in fiber, such as raw fruits and vegetables, whole grains, beans, and nuts. Meats should be lean cut. Avoid canned, processed, and fried foods. °· Drink enough fluid to keep your urine clear or pale yellow. °· Weigh yourself every day. This helps identify if you are retaining fluid that may make your heart and lungs   work harder. °· Rest and limit activity as directed by your health care provider. You may be instructed to: °¨ Stop any activity at once if you have chest pain, shortness of breath, irregular heartbeats, or dizziness. Get help right away if you have any of these symptoms. °¨ Move around frequently for short periods or take short walks as directed by your health care provider. Increase your activities gradually. You may need physical therapy or cardiac rehabilitation to help strengthen your muscles and build your endurance. °¨ Avoid lifting, pushing, or pulling anything heavier than 10 lb (4.5 kg) for at least 6 weeks after surgery. °· Do not drive until your health care provider approves.  °· Ask your health  care provider when you may return to work. °· Ask your health care provider when you may resume sexual activity. °· Keep all follow-up visits as directed by your health care provider. This is important. °SEEK MEDICAL CARE IF: °· You have swelling, redness, increasing pain, or drainage at the site of an incision. °· You have a fever. °· You have swelling in your ankles or legs. °· You have pain in your legs.   °· You gain 2 or more pounds (0.9 kg) a day. °· You are nauseous or vomit. °· You have diarrhea.  °SEEK IMMEDIATE MEDICAL CARE IF: °· You have chest pain that goes to your jaw or arms. °· You have shortness of breath.   °· You have a fast or irregular heartbeat.   °· You notice a "clicking" in your breastbone (sternum) when you move.   °· You have numbness or weakness in your arms or legs. °· You feel dizzy or light-headed.   °MAKE SURE YOU: °· Understand these instructions. °· Will watch your condition. °· Will get help right away if you are not doing well or get worse. °Document Released: 03/11/2005 Document Revised: 01/06/2014 Document Reviewed: 01/29/2013 °Exit[employer]® Patient Information ©2015 [employer]. This information is not intended to replace advice given to you by your health care provider. Make sure you discuss any questions you have with your health care provider. ° ° ° °

## 2015-01-07 NOTE — Progress Notes (Signed)
EPW d/c at this time per MD order; bedrest untilo 1230; pt tolerated well; will cont. To monitor.

## 2015-01-07 NOTE — Progress Notes (Signed)
Pt very tearful at this time; pt given '1mg'$  PO Ativan; cardiac rehab at bedside; will cont. To monitor.

## 2015-01-07 NOTE — Progress Notes (Addendum)
      [address]       [place] [address]             [phone]      5 Days Post-Op Procedure(s) (LRB): CORONARY ARTERY BYPASS GRAFTING (CABG)TIMES TWO USING RIGHT GREATER SAPHENOUS LEG VEIN HARVESTED ENDOSCOPICALLY (N/A)   Subjective:  Sheri Hernandez states she is doing okay.  She continues to wish to go to SNF stating her husband works full time and her son is not reliable to provide daily care.    Objective: Vital signs in last 24 hours: Temp:  [97.4 F (36.3 C)-98.7 F (37.1 C)] 98.7 F (37.1 C) (05/04 0514) Pulse Rate:  [102-113] 102 (05/04 0514) Cardiac Rhythm:  [-] Sinus tachycardia (05/04 0833) Resp:  [18-20] 18 (05/04 0514) BP: (96-128)/(59-73) 110/73 mmHg (05/04 0514) SpO2:  [97 %-100 %] 97 % (05/04 0514) Weight:  [190 lb 7.6 oz (86.4 kg)] 190 lb 7.6 oz (86.4 kg) (05/04 0514)  Intake/Output from previous day: 05/03 0701 - 05/04 0700 In: 480 [P.O.:480] Out: 2000 [Urine:2000]  General appearance: alert, cooperative and no distress Heart: regular rate and rhythm Lungs: clear to auscultation bilaterally Abdomen: soft, non-tender; bowel sounds normal; no masses,  no organomegaly Extremities: edema trace Wound: clean and dry  Lab Results:  Recent Labs  01/05/15 0410 01/06/15 0524  WBC 12.3* 8.1  HGB 8.1* 8.0*  HCT 25.4* 25.1*  PLT 176 176   BMET:  Recent Labs  01/05/15 0410 01/06/15 0524  NA 132* 135  K 4.1 3.8  CL 99* 100*  CO2 25 24  GLUCOSE 115* 103*  BUN 15 21*  CREATININE 0.98 1.05*  CALCIUM 8.7* 9.0    PT/INR: No results for input(s): LABPROT, INR in the last 72 hours. ABG    Component Value Date/Time   PHART 7.312* 01/03/2015 1554   HCO3 21.4 01/03/2015 1554   TCO2 22 01/04/2015 1714   ACIDBASEDEF 4.0* 01/03/2015 1554   O2SAT 96.0 01/03/2015 1554   CBG (last 3)   Recent Labs  01/06/15 1621 01/06/15 2132 01/07/15 0614  GLUCAP 91 145* 89    Assessment/Plan: S/P Procedure(s) (LRB): CORONARY ARTERY BYPASS  GRAFTING (CABG)TIMES TWO USING RIGHT GREATER SAPHENOUS LEG VEIN HARVESTED ENDOSCOPICALLY (N/A)  1. CV- Sinus Tach- on Lopressor and Amiodarone, blood pressure remains labile  2. Pulm- wean oxygen as tolerated, continue IS 3. Renal- remains hypervolemic HCTZ d/c'd due to hypotension- will start low dose Lasix 4. Deconditioning- patient does not have help at discharge, would benefit from SNF placement 5. Dispo- patient stable, watch BP/HR will titrate meds as able, wean oxygen, continue current care   LOS: 5 days    BARRETT, ERIN 01/07/2015  I have seen and examined Sheri Hernandez and agree with the above assessment  and plan.  [name] MD Beeper [phone] Office [phone] 01/07/2015 3:54 PM

## 2015-01-07 NOTE — Discharge Summary (Signed)
Physician Discharge Summary       [address]       [place] [address]             [phone]    Patient ID: Sheri Hernandez MRN: [id number] DOB/AGE: [birth year] 59 y.o.  Admit date: 01/02/2015 Discharge date: 01/09/2015  Admission Diagnoses: 1. S/p NSTEMI 2. CAD 3. History of hypertension 4. History of RCC (s/p left nephrectomy) 5. History of papilloma of the breast (s/p left lumpectomy) 6. History of ashtma 7. History of tobacco abuse  Discharge Diagnoses:  1. S/p NSTEMI 2. CAD 3. History of hypertension 4. History of RCC (s/p left nephrectomy) 5. History of papilloma of the breast (s/p left lumpectomy) 6. History of ashtma 7. History of tobacco abuse 8. ABL anemia  Procedure (s):  1. Left Heart Cath, Selective Coronary Angiography, LV angiography, attempted unsuccessful PCI of the right coronary artery with acute vessel closure by Dr. Fletcher Anon on 01/02/2015:  Coronary angiography: Coronary dominance: Right   Left Main: Normal  Left Anterior Descending (LAD): Moderately calcified and normal in size. There is diffuse 20% disease proximally. There is diffuse 40% disease in the midsegment.  1st diagonal (D1): Small in size with minor irregularities.  2nd diagonal (D2): Large in size with 20% ostial stenosis.  3rd diagonal (D3): Very small in size.  Circumflex (LCx): Normal in size and nondominant. The vessel is moderately calcified. There is a 95% proximal stenosis and 80% mid stenosis.  1st obtuse marginal: Small in size with minor irregularities.  2nd obtuse marginal: Medium in size with minor irregularities.  3rd obtuse marginal: Medium in size with minor irregularities.   Right Coronary Artery: Large in size and dominant. The vessel is heavily calcified in the midsegment. It is aneurysmal in the mid and distal segment. There is 95% proximal stenosis followed by diffuse 80% mid stenosis. There is an 80% stenosis distally  between 2 aneurysmal segments.  Posterior descending artery: Normal in size with 95% proximal stenosis.  Posterior AV segment: Normal  Posterolateral branchs: PL 1 is small in size. PL 2 is very large in size with diffuse 50% proximal stenosis.  Left ventriculography: Left ventricular systolic function is (normal , LVEF is estimated at 55 %, there is mild mitral regurgitation  2. EMERGENT CORONARY ARTERY BYPASS GRAFTING x 2 -SEQUENTIAL SVG to PDA, PLVB by Dr. Servando Snare on 01/02/2015.  History of Presenting Illness: Dr. Servando Snare was called urgently to cath lab to see patient with severe chest pain and EKG changes of inferior MI. Multiple attempts to get wire across the right coronary unsuccessful. Patient has stage IV renal cell cancer. Discussed case with Dr Marcello Moores oncologist [facility] , patient has seen recent regression of disease on Nevibnib x 10 doses. With response seen he was encouraged short term to treat cad aggressively.  Brief Hospital Course:  The patient was extubated the morning of post operative day one without difficulty. She remained afebrile and hemodynamically stable. She was weaned off of Amiodarone, Dopamine, and Neo synephrine drips. Gordy Councilman, a line, chest tubes, and foley were removed early in the post operative course. Lopressor was started and titrated accordingly. She was volume over loaded and diuresed. She had ABL anemia. She did not require a post op transfusion. Her last H and H was 8 and 25.1. She was weaned off the insulin drip. The patient's glucose remained well controlled. The patient's HGA1C pre op was 6.1. She will need further surveillance of her HGA1C by her  PCP after discharge.  The patient was felt surgically stable for transfer from the ICU to PCTU for further convalescence on 01/05/2015. She continues to progress with cardiac rehab. She was ambulating on room air. She has been tolerating a diet and has had a bowel movement. Epicardial pacing wires and  chest tube sutures will be removed prior to discharge. The patient is felt surgically stable for discharge today.   Latest Vital Signs: Blood pressure 120/67, pulse 98, temperature 97.2 F (36.2 C), temperature source Oral, resp. rate 18, height '5\' 8"'$  (1.727 m), weight 187 lb 1.6 oz (84.868 kg), SpO2 98 %.  Physical Exam: General appearance: alert, cooperative and no distress Heart: regular rate and rhythm Lungs: clear to auscultation bilaterally Abdomen: soft, non-tender; bowel sounds normal; no masses, no organomegaly Extremities: edema trace Wound: clean and dry  Discharge Condition: Stable and discharged to home   Recent laboratory studies:  Lab Results  Component Value Date   WBC 8.1 01/06/2015   HGB 8.0* 01/06/2015   HCT 25.1* 01/06/2015   MCV 82.8 01/06/2015   PLT 176 01/06/2015   Lab Results  Component Value Date   NA 135 01/06/2015   K 3.8 01/06/2015   CL 100* 01/06/2015   CO2 24 01/06/2015   CREATININE 1.05* 01/06/2015   GLUCOSE 103* 01/06/2015     Diagnostic Studies: Dg Chest 2 View  01/06/2015   CLINICAL DATA:  CABG.  EXAM: CHEST  2 VIEW  COMPARISON:  10/2014.  FINDINGS: Interim removal of right IJ sheath. Prior CABG. Epicardial pacing wires noted. Stable cardiomegaly. No pulmonary venous congestion. Continued clearing of bibasilar infiltrates and pleural effusions. No pneumothorax.  IMPRESSION: 1. Interim removal of right IJ sheath. 2. Prior CABG. Stable cardiomegaly. No pulmonary venous congestion. 3. Interim continued clearing of bibasilar infiltrates and pleural effusions.   Electronically Signed   By: Marcello Moores  Register   On: 01/06/2015 07:07    Discharge Medications:    Medication List    STOP taking these medications        amLODipine 10 MG tablet  Commonly known as:  NORVASC     aspirin 81 MG tablet  Replaced by:  aspirin 325 MG EC tablet     hydrochlorothiazide 12.5 MG capsule  Commonly known as:  MICROZIDE     HYDROcodone-acetaminophen 5-325  MG per tablet  Commonly known as:  NORCO/VICODIN      TAKE these medications        acetaminophen 500 MG tablet  Commonly known as:  TYLENOL  Take 1,000 mg by mouth every 6 (six) hours as needed (pain).     amiodarone 200 MG tablet  Commonly known as:  PACERONE  Take 1 tablet (200 mg total) by mouth daily.  Start taking on:  01/10/2015     aspirin 325 MG EC tablet  Take 1 tablet (325 mg total) by mouth daily.     atorvastatin 80 MG tablet  Commonly known as:  LIPITOR  Take 1 tablet (80 mg total) by mouth daily at 6 PM.     levothyroxine 137 MCG tablet  Commonly known as:  SYNTHROID, LEVOTHROID  Take 1 tablet (137 mcg total) by mouth daily before breakfast.     LORazepam 1 MG tablet  Commonly known as:  ATIVAN  Take 1 mg by mouth 3 (three) times daily as needed (anxiety).     metoprolol tartrate 25 MG tablet  Commonly known as:  LOPRESSOR  Take 0.5 tablets (12.5 mg total) by mouth  2 (two) times daily.     OPDIVO IV  Inject into the vein every 14 (fourteen) days.     oxyCODONE 5 MG immediate release tablet  Commonly known as:  Oxy IR/ROXICODONE  Take 1 tablet (5 mg total) by mouth every 3 (three) hours as needed for severe pain.          The patient has been discharged on:   1.Beta Blocker:  Yes [ x  ]                              No   [   ]                              If No, reason:  2.Ace Inhibitor/ARB: Yes [   ]                                     No  [  x  ]                                     If No, reason:Labile BP, one kidney  3.Statin:   Yes [ x  ]                  No  [   ]                  If No, reason:  4.Ecasa:  Yes  [  x ]                  No   [   ]                  If No, reason:  Follow Up Appointments: Follow-up Information    Follow up with Truitt Merle, NP On 01/23/2015.   Specialties:  Nurse Practitioner, Interventional Cardiology, Cardiology, Radiology   Why:  Appointment time is at 11:30 am   Contact information:   [place] [place] [address] [phone]       Follow up with [name], MD On 02/09/2015.   Specialty:  Cardiothoracic Surgery   Why:  PA/LAT CXR to be taken (at [facility] which is in the same building as Dr. Everrett Coombe office) on at 02/09/2015 at 12:30 pm; Appointment time is at 1:30 pm with physician assistant   Contact information:   [address] [name] [address] [phone]       Follow up with Odette Fraction, MD.   Specialty:  Family Medicine   Why:  Call for a follow up appointment regarding further surveillnace of HGA1C 6.1   Contact information:   [address] [place] [address] [phone]       Signed: Cinda Quest 01/09/2015, 10:54 AM

## 2015-01-08 LAB — GLUCOSE, CAPILLARY: Glucose-Capillary: 103 mg/dL — ABNORMAL HIGH (ref 70–99)

## 2015-01-08 MED ORDER — OXYCODONE HCL 5 MG PO TABS
5.0000 mg | ORAL_TABLET | ORAL | Status: DC | PRN
  Administered 2015-01-08 – 2015-01-09 (×2): 5 mg via ORAL
  Filled 2015-01-08 (×2): qty 1

## 2015-01-08 NOTE — Progress Notes (Signed)
Pt called and upset that dietary told her that her husband could eat with her and then said they could not send a tray.  I spoke with dietary and patient and clarified that patient would get a dinner tray and that husband could purchase a tray from dietary.  I placed call and facilitated delivery.  Patient upset that some things were not brought which were ordered. Pt and husband received tray and missing items plus dessert and extra tray. Pt resting with call bell within reach.  Will continue to monitor. Payton Emerald, RN

## 2015-01-08 NOTE — Progress Notes (Signed)
Physical Therapy Treatment Patient Details Name: Sheri Hernandez MRN: [id number] DOB: Jul 30, 1956 Today's Date: 01/08/2015    History of Present Illness Adm 4/29 with chest pain; +NSTEMI; 4/30 CABG x 2 PMHx- metatstatic renal Ca with nephrectomy, anxiety    PT Comments    Pt much more confused and unsafe on her feet today. See below for specifics, however required 2 person mod assist to safely ambulate 50 ft. Discussed discharge plans and pt waffling on whether her 57 yo unemployed son is reliable to stay with her and provide appropriate care (husband works days). Currently requires O2 to ambulate, which further increases her fall risk (especially as she has poor safety awareness, including decr attention to O2 line). Pt agreeable to continued therapies at SNF to incr her safety and independence.   Follow Up Recommendations  SNF;Supervision/Assistance - 24 hour     Equipment Recommendations  Rolling walker with 5" wheels;3in1 (PT) (or toilet riser (TBA))    Recommendations for Other Services       Precautions / Restrictions Precautions Precautions: Sternal;Fall Precaution Comments: only able to I'ly state 1 precaution;educated on sternal precautions; required vc to adhere     Mobility  Bed Mobility                  Transfers Overall transfer level: Needs assistance Equipment used: Rolling walker (2 wheeled) Transfers: Sit to/from Stand Sit to Stand: Mod assist         General transfer comment: mod assist (both recliner and toilet) with poor anterior wt-shift or initial hip/knee extension  Ambulation/Gait Ambulation/Gait assistance: Min assist;Mod assist;+2 safety/equipment Ambulation Distance (Feet): 50 Feet (standing rest; x 3 reps) Assistive device: Rolling walker (2 wheeled) (push w/c ) Gait Pattern/deviations: Step-through pattern;Decreased stride length;Shuffle;Staggering left;Staggering right;Drifts right/left Gait velocity: decr   General Gait Details:  much more unsteady with decr awareness of deficits; required up to mod assist to regain balance on two occasions (staggering steps); 2nd person needed for portable O2   Stairs            Wheelchair Mobility    Modified Rankin (Stroke Patients Only)       Balance Overall balance assessment: Needs assistance   Sitting balance-Leahy Scale: Fair     Standing balance support: Bilateral upper extremity supported Standing balance-Leahy Scale: Poor Standing balance comment: static with ant-post sway; dynamic with incr lateral sway                    Cognition Arousal/Alertness: Lethargic;Suspect due to medications (frequent eye closing) Behavior During Therapy: Flat affect Overall Cognitive Status: Impaired/Different from baseline Area of Impairment: Memory;Safety/judgement;Attention;Awareness;Problem solving   Current Attention Level: Sustained Memory: Decreased recall of precautions;Decreased short-term memory   Safety/Judgement: Decreased awareness of safety Awareness: Intellectual Problem Solving: Slow processing;Requires verbal cues;Requires tactile cues General Comments: drowsy, however also confused; speaking off topic and at times non-sensical; required frequent vc for sternal precautions; unable to problem-solve how to turn to sit with O2 tubing     Exercises      General Comments        Pertinent Vitals/Pain On 2L SaO2 at rest 97%; while walking unable to get a reading and incr to 3L for safety; on return to room, immediately after sitting, SaO2 96% on 3L.  Pain Assessment: Faces Faces Pain Scale: Hurts little more Pain Location: chest Pain Intervention(s): Monitored during session;Premedicated before session    Home Living  Prior Function            PT Goals (current goals can now be found in the care plan section) Acute Rehab PT Goals Patient Stated Goal: return to volunteering at [employer] Time For Goal Achievement:  01/12/15 Progress towards PT goals: Not progressing toward goals - comment (more confused and unsteady)    Frequency  Min 3X/week    PT Plan Discharge plan needs to be updated    Co-evaluation             End of Session Equipment Utilized During Treatment: Oxygen;Gait belt Activity Tolerance: Patient limited by lethargy Patient left: in chair;with call bell/phone within reach     Time: 0931-1000 PT Time Calculation (min) (ACUTE ONLY): 29 min  Charges:  $Gait Training: 23-37 mins                    G Codes:      Teyana Pierron 27-Jan-2015, 11:27 AM Pager [phone]

## 2015-01-08 NOTE — Progress Notes (Addendum)
      [address]       [place] [address]             [phone]      6 Days Post-Op Procedure(s) (LRB): CORONARY ARTERY BYPASS GRAFTING (CABG)TIMES TWO USING RIGHT GREATER SAPHENOUS LEG VEIN HARVESTED ENDOSCOPICALLY (N/A)   Subjective:  Ms. Distler has no new complaints.  She is very tired this morning and having difficulty staying awake during exam.  She had just taken pain medication this morning.  Objective: Vital signs in last 24 hours: Temp:  [97.9 F (36.6 C)-98.7 F (37.1 C)] 98 F (36.7 C) (05/05 0321) Pulse Rate:  [90-104] 102 (05/05 0321) Cardiac Rhythm:  [-] Sinus tachycardia (05/04 1945) Resp:  [18] 18 (05/05 0321) BP: (107-129)/(61-77) 115/64 mmHg (05/05 0321) SpO2:  [97 %-100 %] 98 % (05/05 0321) Weight:  [189 lb 9.5 oz (86 kg)] 189 lb 9.5 oz (86 kg) (05/05 0103)  Intake/Output from previous day: 05/04 0701 - 05/05 0700 In: 360 [P.O.:360] Out: 2100 [Urine:2100]  General appearance: alert, cooperative and no distress Heart: regular rate and rhythm Lungs: clear to auscultation bilaterally Abdomen: soft, non-tender; bowel sounds normal; no masses,  no organomegaly Extremities: edema trace, ecchymosis RLE Wound: clean and dry  Lab Results:  Recent Labs  01/06/15 0524  WBC 8.1  HGB 8.0*  HCT 25.1*  PLT 176   BMET:  Recent Labs  01/06/15 0524  NA 135  K 3.8  CL 100*  CO2 24  GLUCOSE 103*  BUN 21*  CREATININE 1.05*  CALCIUM 9.0    PT/INR: No results for input(s): LABPROT, INR in the last 72 hours. ABG    Component Value Date/Time   PHART 7.312* 01/03/2015 1554   HCO3 21.4 01/03/2015 1554   TCO2 22 01/04/2015 1714   ACIDBASEDEF 4.0* 01/03/2015 1554   O2SAT 96.0 01/03/2015 1554   CBG (last 3)   Recent Labs  01/07/15 1656 01/07/15 2115 01/08/15 0641  GLUCAP 103* 101* 103*    Assessment/Plan: S/P Procedure(s) (LRB): CORONARY ARTERY BYPASS GRAFTING (CABG)TIMES TWO USING RIGHT GREATER SAPHENOUS LEG VEIN HARVESTED  ENDOSCOPICALLY (N/A)  1. CV- remains in Sinus Tach- will increase Lopressor to 25 mg BID if she is able to tolerate 2. Pulm- wean oxygen as tolerated, however, patient has been desating with ambulation and may require use at home 3. Renal- creatinine has been WNL, remains hypervolemic continue Lasix 4. CBGs controlled, patient not a diabetic will d/c fingersticks and insulin, change diet to heart healthy 5. Deconditioning- patient still interested in SNF vs. Go home will discuss options with CM and CSW 6. DIspo- patient stable, wean oxygen as tolerated, possibly ready for SNF in the AM    LOS: 6 days    BARRETT, ERIN 01/08/2015  I have seen and examined Sheri Hernandez and agree with the above assessment  and plan.  [name] MD Beeper [phone] Office [phone] 01/08/2015 9:16 AM

## 2015-01-08 NOTE — Progress Notes (Signed)
CARDIAC REHAB PHASE I   PRE:  Rate/Rhythm: 98 SR  BP:  Sitting: 118/78        SaO2: 99 2L, 93 RA  MODE:  Ambulation: 170 ft   POST:  Rate/Rhythm: 116 ST  BP:  Sitting: 116/74         SaO2: 90-91 RA after 1 minute  Pt tearful upon entering room, states her husband upset her, agreeable to walk. Pt ambulated 170 ft on RA, 2x assist, rolling walker, gait belt.  Unsteady gait, tolerated fair. Pt intermittently sleepy, legs bucking occasionally during ambulation, pt states she is less sleepy than this morning though. Pt O2 during ambulation 90-91% on RA. Pt returned to chair, feet elevated, requesting to be placed on oxygen. Pt placed on 2L O2. Call bell within reach.   [phone]  Lenna Sciara, RN, BSN 01/08/2015 2:06 PM

## 2015-01-08 NOTE — Progress Notes (Signed)
Called to patient room to provide personal care.  Pt upset and said she had called and she only had one kidney and we were giving her lasix, and telling her to drink water and not drink water.  Pt felt throughout the day that she did not know what to do because ten different people were telling her ten different things.  Patient calm and listening one moment and the crying or cursing the next. Patient concerned that she and her husband did not talk to Dr. Servando Snare about discharging.  I spoke with Dr. Servando Snare, patient and husband.  Pt/husband understand that CSW is working to arrange skilled placement.  Will follow up tomorrow during progression.  Pt understands that once place found she would be ready for discharge. Pt resting with call bell within reach.  Will continue to monitor. Payton Emerald, RN

## 2015-01-08 NOTE — Op Note (Signed)
NAMEELFRIEDE, BONINI              ACCOUNT NO.:  [id number]  MEDICAL RECORD NO.:  [id number]  LOCATION:  2W23C                        FACILITY:  [facility]  PHYSICIAN:  Lanelle Bal, MD    DATE OF BIRTH:  01-07-1956  DATE OF PROCEDURE:  01/02/2015 DATE OF DISCHARGE:                              OPERATIVE REPORT   PREOPERATIVE DIAGNOSIS:  Evolving inferior myocardial infarction, acute.  POSTOPERATIVE DIAGNOSIS:  Evolving inferior myocardial infarction, acute.  SURGICAL PROCEDURE:  Emergency coronary artery bypass grafting x2 with greater saphenous vein from the right thigh to the posterior descending and posterolateral branches of the right coronary artery.  SURGEON:  Lanelle Bal, MD  FIRST ASSISTANT:  Ellwood Handler, PA.  BRIEF HISTORY:  The patient is a 59 year old female with known stage IV renal cell carcinoma, currently undergoing active treatment with immunotherapy.  The most recent scans and evaluation 2 weeks prior to surgery showed a 50% response radiographically to her tumors with immunotherapy.  The patient presented to the [facility] Emergency Room with EKG changes and prolonged severe chest pain, was taken directly to the cath lab by Dr. Fletcher Anon.  Attempts by he and Dr. Burt Knack to open and dilate a highly-calcified proximal right coronary artery lesion were unsuccessful.  In addition, the patient had a high-grade stenosis of the posterior descending.  The LAD and left main were intact.  There was a very small first circumflex branch, which was diseased, but the distal vessel was not bypassable.  The patient was in extreme discomfort and was very agitated.  In discussion with her oncologist at [facility], Dr. Marcello Moores and then her husband, we decided to proceed with emergency coronary artery bypass grafting.  The risks and options were discussed in detail with the patient's husband.  The patient was unable to give informed consent.  She was taken directly to the operating room  from the cath lab, underwent general endotracheal anesthesia.  DESCRIPTION OF PROCEDURE:  After general endotracheal anesthesia, appropriate time-out was performed, the patient was intubated.  Gordy Councilman and arterial line monitors were placed.  Skin of the chest and legs was prepped with Betadine and draped in a sterile manner.  Using the [employer] endovein harvesting system, segment of vein was harvested from the right greater saphenous vein and was of good quality and caliber. Median sternotomy was performed.  Because of the distal nature of the disease involving the right coronary artery, it was not appropriate to attempt to use a mammary artery.  In addition, the patient had no LAD disease.  The patient was systemically heparinized the ascending aorta and the right atrium was cannulated.  An aortic root vent cardioplegia needle was introduced into the ascending aorta.  The patient was placed on cardiopulmonary bypass 2.4 L/min/meter squared.  Sites of anastomosis were selected.  The patient's body temperature was allowed to drift down, aortic crossclamp was applied and 500 mL of cold blood potassium cardioplegia was administered.  The heart was elevated, the posterior descending coronary artery was opened, 1.5-mm probe distally.  Using a diamond-type, side-to-side anastomosis was carried out with a running 7- 0 Prolene.  The distal extent of the same vein was then carried to the posterolateral branch  of the right coronary artery, which was slightly larger.  Using a running 7-0 Prolene, distal anastomosis was performed. Additional cold blood cardioplegia was administered down the vein grafts.  With crossclamp still in place, single punch aortotomy was performed and the proximal anastomosis was completed with running 6-0 Prolene.  Air was evacuated from the ascending aorta and grafts, and aortic cross-clamp was removed with total crossclamp time of 35 minutes. The patient spontaneously  converted to sinus rhythm.  Sites of anastomosis were inspected.  The patient's body temperature was rewarmed to 37 degrees.  She was then ventilated and weaned from cardiopulmonary bypass without difficulty.  She remained hemodynamically stable.  Atrial and ventricular pacing wires were applied.  Graft marker was applied. Two Blake mediastinal drains were left in place.  Pericardium was loosely reapproximated.  Sternum was closed with #6 stainless steel wire.  Fascia was closed with interrupted 0 Vicryl and running 3-0 Vicryl in the subcutaneous tissue, 4-0 subcuticular stitch and skin edges.  Dry dressings were applied.  Sponge and needle counts were reported as correct at completion of the procedure.  The patient tolerated the procedure without obvious complication and was transferred to the Surgical Intensive Care Unit for further postoperative care.     Lanelle Bal, MD     EG/MEDQ  D:  01/07/2015  T:  01/08/2015  Job:  [id number]

## 2015-01-08 NOTE — Clinical Social Work Note (Signed)
Clinical Social Work Assessment  Patient Details  Name: Sheri Hernandez MRN: [id number] Date of Birth: May 31, 1956  Date of referral:  01/08/15               Reason for consult:  Facility Placement                Permission sought to share information with:  Case Manager, Family Supports Permission granted to share information::  Yes, Verbal Permission Granted  Name::     Domingo Madeira:  [id number]  Agency::  none at this time.  Will be faxing out  Relationship::  Son or husband  Contact Information:  See above  Housing/Transportation Living arrangements for the past 2 months:  [place] of Information:  Patient, Case Manager Patient Interpreter Needed:  None Criminal Activity/Legal Involvement Pertinent to Current Situation/Hospitalization:  No - Comment as needed Significant Relationships:  Adult Children, Spouse, Other Family Members Lives with:  Adult Children, Spouse Do you feel safe going back to the place where you live?  Yes (patient requesting to go home at discharge) Need for family participation in patient care:  Yes (Comment) (patient very groggy and falls asleep at times. Asking to be part of decisions, but wants husband called)  Care giving concerns:  Patient at time of assessment reports she wants to go home at discharge and on the fence about whether or not she wants to go to short term rehab.  Patient very groggy, impulsive, and tired throughout assessment. Alert and oriented, however fell asleep during discussion.  Patient's husband works full time, son is at home (adult son 57 years old) who is able to provide care as she reports he took care of her mother for 8 years until she passed 2 years ago.  Has not discussed with son if he will take care of her.   Social Worker assessment / plan: LCSW received consult for SNF, reviewed chart and discussed with CM.  Treatment team recommending SNF at DC due to acute needs and medical concerns of going home. Patient  is not refusing, however not accepting services either as she reports she does not know what to do.  Patient reports she lives with son and husband and son is him home and does not work during the day unless he gets called to cut down a tree.  Patient reports she really does not want to go into a facility where everyone is dying and would prefer home.  LCSW gave patient information and education about SNF and short term rehab in effort to help alleviate anxiety about facility.  Patient called husband while LCSW in room to discuss as a team, however he was unavailable and asked to call back in 15 minutes.  LCSW will return and discuss plans of DC home with hh or DC to SNF.  Employment status:  Therapist, music:  Managed Care PT Recommendations:  [place] / Referral to community resources:  [facility]  Patient/Family's Response to care:  Undecided at this time.   Patient/Family's Understanding of and Emotional Response to Diagnosis, Current Treatment, and Prognosis:  Patient at this time does not demonstrate her understanding of treatment teams recommendations as she continues to fall asleep during assessment. She makes joke throughout assessment and then becomes tearful.  Patient very erratic with emotions at this time.  Unable to reach other family, but still working to understand their wishes and involvement.  Patient reports wanting to go home.  Emotional Assessment Appearance:  Disheveled, Appears stated age Attitude/Demeanor/Rapport:  Crying (lethargic) Affect (typically observed):  Overwhelmed, In denial, Tearful/Crying Orientation:  Oriented to Self, Oriented to Place, Oriented to Situation, Oriented to  Time Alcohol / Substance use:  Not Applicable Psych involvement (Current and /or in the community):  No (Comment)  Discharge Needs  Concerns to be addressed:  Adjustment to Illness, Discharge Planning Concerns, Care  Coordination Readmission within the last 30 days:  No Current discharge risk:  None Barriers to Discharge:  Continued Medical Work up   [place], LCSW 01/08/2015, 11:20 AM

## 2015-01-09 MED ORDER — ASPIRIN 325 MG PO TBEC: 325.0000 mg | DELAYED_RELEASE_TABLET | Freq: Every day | ORAL | Status: DC

## 2015-01-09 MED ORDER — AMIODARONE HCL 200 MG PO TABS: 200.0000 mg | ORAL_TABLET | Freq: Every day | ORAL | Status: DC

## 2015-01-09 MED ORDER — OXYCODONE HCL 5 MG PO TABS: 5.0000 mg | ORAL_TABLET | ORAL | Status: DC | PRN

## 2015-01-09 MED ORDER — METOPROLOL TARTRATE 25 MG PO TABS: 12.5000 mg | ORAL_TABLET | Freq: Two times a day (BID) | ORAL | Status: DC

## 2015-01-09 MED ORDER — ATORVASTATIN CALCIUM 80 MG PO TABS
80.0000 mg | ORAL_TABLET | Freq: Every day | ORAL | Status: DC
Start: 2015-01-09 — End: 2015-02-09

## 2015-01-09 NOTE — Progress Notes (Addendum)
      [address]       [place] [address]             [phone]      7 Days Post-Op Procedure(s) (LRB): CORONARY ARTERY BYPASS GRAFTING (CABG)TIMES TWO USING RIGHT GREATER SAPHENOUS LEG VEIN HARVESTED ENDOSCOPICALLY (N/A)   Subjective:  Sheri Hernandez is upset this morning.  States she is lonely.  Patient appears to be depressed.  +ambulation  + BM  Objective: Vital signs in last 24 hours: Temp:  [97.2 F (36.2 C)-98.4 F (36.9 C)] 97.2 F (36.2 C) (05/06 0407) Pulse Rate:  [98-108] 98 (05/06 0407) Cardiac Rhythm:  [-] Sinus tachycardia (05/06 0115) Resp:  [18] 18 (05/06 0407) BP: (112-139)/(67-79) 120/67 mmHg (05/06 0407) SpO2:  [93 %-98 %] 98 % (05/06 0800) Weight:  [187 lb 1.6 oz (84.868 kg)] 187 lb 1.6 oz (84.868 kg) (05/06 0354)  Intake/Output from previous day: 05/05 0701 - 05/06 0700 In: -  Out: 1450 [Urine:1450]  General appearance: alert, cooperative and no distress Heart: regular rate and rhythm Lungs: clear to auscultation bilaterally Abdomen: soft, non-tender; bowel sounds normal; no masses,  no organomegaly Extremities: edema trace Wound: clean and dry, ecchymosis RLE  Lab Results: No results for input(s): WBC, HGB, HCT, PLT in the last 72 hours. BMET: No results for input(s): NA, K, CL, CO2, GLUCOSE, BUN, CREATININE, CALCIUM in the last 72 hours.  PT/INR: No results for input(s): LABPROT, INR in the last 72 hours. ABG    Component Value Date/Time   PHART 7.312* 01/03/2015 1554   HCO3 21.4 01/03/2015 1554   TCO2 22 01/04/2015 1714   ACIDBASEDEF 4.0* 01/03/2015 1554   O2SAT 96.0 01/03/2015 1554   CBG (last 3)   Recent Labs  01/07/15 1656 01/07/15 2115 01/08/15 0641  GLUCAP 103* 101* 103*    Assessment/Plan: S/P Procedure(s) (LRB): CORONARY ARTERY BYPASS GRAFTING (CABG)TIMES TWO USING RIGHT GREATER SAPHENOUS LEG VEIN HARVESTED ENDOSCOPICALLY (N/A)  1. CV- NSR Tachy, BP- continue Lopressor and Amiodarone 2. Pulm- wean  oxygen as tolerated continue IS 3. Renal- creatinine okay, weight trending down will repeat Lasix today 4. Deconditioning- patient has decided on SNF for discharge 5. Dispo- patient stable, ready for d/c today if bed available   LOS: 7 days    BARRETT, Sheri Hernandez 01/09/2015  I have seen and examined the patient and agree with the assessment and plan as outlined.  Transfer to tele bed if patient doesn't get discharged today.  Rexene Alberts 01/09/2015 11:26 AM

## 2015-01-09 NOTE — Progress Notes (Signed)
CARDIAC REHAB PHASE I   Spoke with pt and husband regarding education. Discussed sternal precautions, mobility, diet, IS and CPRII. Will send referral to [facility]. Pt somewhat tangential, husband with good understanding.  [phone]  Sheri Hernandez [place] CES, ACSM 01/09/2015 2:33 PM

## 2015-01-09 NOTE — Progress Notes (Signed)
Physical Therapy Treatment Patient Details Name: Sheri Hernandez MRN: [id number] DOB: 07-25-1956 Today's Date: 01/09/2015    History of Present Illness Adm 4/29 with chest pain; +NSTEMI; 4/30 CABG x 2 PMHx- metatstatic renal Ca with nephrectomy, anxiety    PT Comments    Pt continues to be a high fall risk (staggers, decr attention to task, easily distracted, poor insight to her deficits). Cognition improved compared to 5/5 as pt more alert, however not safe to be alone. Still unable to recall sternal precautions and requires max cues to adhere to precautions with mobility.    Follow Up Recommendations  SNF;Supervision/Assistance - 24 hour     Equipment Recommendations  Rolling walker with 5" wheels;3in1 (PT) (or toilet riser (TBA))    Recommendations for Other Services       Precautions / Restrictions Precautions Precautions: Sternal;Fall Precaution Comments: only able to I'ly state 1 precaution;educated on sternal precautions; required vc to adhere     Mobility  Bed Mobility                  Transfers Overall transfer level: Needs assistance Equipment used: Rolling walker (2 wheeled) Transfers: Sit to/from Stand Sit to Stand: Mod assist         General transfer comment: mod assist (toilet) min assist from recliner; with poor anterior wt-shift or initial hip/knee extension; continues to require multiple vc for sternal precautions  Ambulation/Gait Ambulation/Gait assistance: Mod assist Ambulation Distance (Feet): 200 Feet Assistive device: Rolling walker (2 wheeled) (push w/c ) Gait Pattern/deviations: Step-through pattern;Decreased stride length;Staggering right;Staggering left Gait velocity: decr   General Gait Details: continues to be unsteady with staggering losses of balance requiring up to mod assist to recover; let's go of RW to wave at passer-bys with LOB   Stairs            Wheelchair Mobility    Modified Rankin (Stroke Patients Only)        Balance     Sitting balance-Leahy Scale: Fair       Standing balance-Leahy Scale: Poor                      Cognition Arousal/Alertness: Awake/alert Behavior During Therapy: WFL for tasks assessed/performed Overall Cognitive Status: Impaired/Different from baseline Area of Impairment: Safety/judgement;Awareness;Problem solving;Memory;Attention   Current Attention Level: Selective Memory: Decreased recall of precautions;Decreased short-term memory   Safety/Judgement: Decreased awareness of safety;Decreased awareness of deficits Awareness: Intellectual Problem Solving: Slow processing;Requires verbal cues;Requires tactile cues General Comments: reports she got up and walked into bathroom by herself (holding furniture, no RW); educated on risks related to falling and emphasized need to call for assist; easily distracted/loses focus; conversation skips beteween topics    Exercises      General Comments        Pertinent Vitals/Pain On Room air; 96% at rest; decr to 88% after walked 50 ft--standing rest with pursed lip breathing and returned to 90 within 60 seconds; up to 98% prior to resume ambulation; repeated standing rest 2 other times during 200 ft total walk.  Pain Assessment: No/denies pain    Home Living                      Prior Function            PT Goals (current goals can now be found in the care plan section) Acute Rehab PT Goals Patient Stated Goal: return to volunteering at [employer] Time For Goal  Achievement: 01/12/15 Progress towards PT goals: Progressing toward goals    Frequency  Min 2X/week    PT Plan Current plan remains appropriate    Co-evaluation             End of Session Equipment Utilized During Treatment: Gait belt Activity Tolerance: No increased pain Patient left: in chair;with call bell/phone within reach     Time: 7322-5672 PT Time Calculation (min) (ACUTE ONLY): 32 min  Charges:  $Gait Training:  23-37 mins                    G Codes:      Bernie Ransford 01-20-2015, 12:02 PM Pager [phone]

## 2015-01-09 NOTE — Clinical Social Work Placement (Signed)
   CLINICAL SOCIAL WORK PLACEMENT  NOTE  Date:  01/09/2015  Patient Details  Name: CLAIRESSA BOULET MRN: [id number] Date of Birth: 1955/10/22  Clinical Social Work is seeking post-discharge placement for this patient at the [place] level of care (*CSW will initial, date and re-position this form in  chart as items are completed):  Yes   Patient/family provided with [place] Work Department's list of facilities offering this level of care within the geographic area requested by the patient (or if unable, by the patient's family).  Yes   Patient/family informed of their freedom to choose among providers that offer the needed level of care, that participate in Medicare, Medicaid or managed care program needed by the patient, have an available bed and are willing to accept the patient.      Patient/family informed of 's ownership interest in [facility] and [facility], as well as of the fact that they are under no obligation to receive care at these facilities.  PASRR submitted to EDS on 01/08/15     PASRR number received on 01/08/15     Existing PASRR number confirmed on       FL2 transmitted to all facilities in geographic area requested by pt/family on 01/08/2015     FL2 transmitted to all facilities within larger geographic area on      Patient informed that his/her managed care company has contracts with or will negotiate with certain facilities, including the following:   ([place])     Yes   Patient/family informed of bed offers received.  Patient chooses bed at [facility] recommends and patient chooses bed at      Patient to be transferred to [facility] on 01/09/15.  Patient to be transferred to facility by Car with Husband     Patient family notified on 01/09/15 of transfer.  Name of family member notified:  [place] Please prepare priority discharge summary, including medications, Please sign FL2     Additional Comment: DC to SNF this afternoon. Patient was noted to be very anxious and nervous, but also appearing to be distressed emotionally. CSW spoke with patient and husband and discussed d/c for short term rehab. Patient agrees to rehab and hopes tor return home soon. Her husband states that he works each day and cannot insure 24 hour care at home for patient.  Facility will obtain insurance authorization with [facility].  Nursing notified to call report to SNF. CSW signing off.    _______________________________________________ Williemae Area, LCSW 01/09/2015, 11:12 PM

## 2015-01-09 NOTE — Progress Notes (Signed)
Removed CT suture and applied benzoin and 1/2 " steri strips.  Pt tolerated procedure well. Pt given signs and symptoms of infection. Payton Emerald, RN

## 2015-01-09 NOTE — Progress Notes (Signed)
   01/09/15 1000  Clinical Encounter Type  Visited With Patient  Visit Type Initial;Spiritual support;Social support  Spiritual Encounters  Spiritual Needs Emotional  Stress Factors  Patient Stress Factors Exhausted;Family relationships;Financial concerns;Lack of knowledge   Chaplain was referred to patient via spiritual care consult. Chaplain visited with patient for roughly 15 minutes today. Patient quickly became upset and teary during our conversation. Patient had a lot of anger and frustrations and expressed those to chaplain. These frustrations were primarily centered on how the patient feels she is not being treated well. Patient insisted if she had more money maybe she would be treated well. Chaplain listened to the patient's concerns and affirmed that he was interested in making the patient's experience of care better. Patient responded with hopelessness and did not expect anything to get better. Chaplain asked patient how she was doing and feeling before she came to the hospital and she said she was fine. After visit chaplain consulted with patient's nurse and the unit's director and let them know what happened during the visit. Chaplain will continue to provide emotional and spiritual care for patient as needed.  Gar Ponto, Chaplain  10:39 AM

## 2015-01-09 NOTE — Progress Notes (Signed)
Pt/family given discharge instructions, medication lists, follow up appointments, and when to call the doctor.  Pt/family verbalizes understanding. Pt given signs and symptoms of infection. Patient transported to [facility] via private automobile by husband.  Report called to RN [place] at [facility] and all questions answered. Payton Emerald, RN

## 2015-01-12 ENCOUNTER — Non-Acute Institutional Stay (SKILLED_NURSING_FACILITY): Payer: 59 | Admitting: Internal Medicine

## 2015-01-12 ENCOUNTER — Encounter: Payer: Self-pay | Admitting: Internal Medicine

## 2015-01-12 DIAGNOSIS — R739 Hyperglycemia, unspecified: Secondary | ICD-10-CM | POA: Diagnosis not present

## 2015-01-12 DIAGNOSIS — Z905 Acquired absence of kidney: Secondary | ICD-10-CM

## 2015-01-12 DIAGNOSIS — Z951 Presence of aortocoronary bypass graft: Secondary | ICD-10-CM | POA: Diagnosis not present

## 2015-01-12 DIAGNOSIS — C642 Malignant neoplasm of left kidney, except renal pelvis: Secondary | ICD-10-CM | POA: Diagnosis not present

## 2015-01-12 DIAGNOSIS — D62 Acute posthemorrhagic anemia: Secondary | ICD-10-CM | POA: Diagnosis not present

## 2015-01-12 DIAGNOSIS — I214 Non-ST elevation (NSTEMI) myocardial infarction: Secondary | ICD-10-CM | POA: Diagnosis not present

## 2015-01-12 HISTORY — DX: Acute posthemorrhagic anemia: D62

## 2015-01-12 HISTORY — DX: Hyperglycemia, unspecified: R73.9

## 2015-01-12 NOTE — Assessment & Plan Note (Signed)
Noted, 2/2 stage 4 CA clear cell

## 2015-01-12 NOTE — Assessment & Plan Note (Signed)
In hospital and was tx with insulin drip but pre-op A1c was 6.1 so will probably not require insulin or any medications further

## 2015-01-12 NOTE — Assessment & Plan Note (Signed)
Pt presented with CP and acute inf MI, taken to cath lab, unable to get a wire to RCA, so pt was taken emergently to CABG X2, which was successful and from which pt is recovering

## 2015-01-12 NOTE — Progress Notes (Signed)
MRN: [id number] Name: Sheri Hernandez  Sex: female Age: 59 y.o. DOB: 01-Jul-1956  [place] #: Sheri Hernandez Facility/Room:112 Level Of Care: SNF Provider: Inocencio Homes Hernandez Emergency Contacts: Extended Emergency Contact Information Primary Emergency Contact: Rinker,Kenneth Address: [address]          [facility] [place], [place] [address] Johnnette Litter of [place] Phone: [phone] Mobile Phone: [phone] Relation: Spouse  Code Status: FULL  Allergies: Review of patient's allergies indicates no known allergies.  Chief Complaint  Patient presents with  . New Admit To SNF  . Discharge Note    HPI: Patient is 59 y.o. female who has stage 4 renal cell CA who underwent urgent CABG for acute inf MI, admitted to SNF for OT/PT. However pt is having a very difficult time being in SNF because her mother died in a SNF and she wants to go home today.  Past Medical History  Diagnosis Date  . Papilloma of breast 2012    left. lumpectomy  . Anxiety   . Complication of anesthesia     hard time waking up 20 yrs. ago when having wisdon teeth removed  . Hypertension   . Shortness of breath     WITH EXERTION  . 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  . Lung mass     LUNG MASSES--COUGH-BIOPSY NON-DIAGNOSTIC.  HX OF MEDIASTINAL MASS AND LUNG MASSES AGE 58 -NEGATIVE BIOSPY-BUT GIVEN DIAGNOSIS OF SARCOIDOSIS.  WORK UP CONTINUING ON DIAGNOSIS FOR PT'S LUNG MASSES--SHE HAS BIOPSY PROVEN KIDNEY CANCER  . S/p nephrectomy     left, due to metastatic RCC, 3/14  . Cancer   . Asthma     Past Surgical History  Procedure Laterality Date  . Tonsillectomy  1967  . Bronchial brush biopsy    . Wids    . Wisdom tooth extraction    . Neck lesion biopsy    . Breast biopsy  08/18/2011    Procedure:  BREAST BIOPSY WITH NEEDLE LOCALIZATION;  Surgeon: Merrie Roof, MD;  Location: [facility];  Service: General;  Laterality: Left;  left breast biopsy with needle localization  . Video bronchoscopy  10/11/2012    Procedure: VIDEO BRONCHOSCOPY WITH FLUORO;  Surgeon: Kathee Delton, MD;  Location: [facility];  Service: Cardiopulmonary;  Laterality: Bilateral;  . Laparoscopic nephrectomy Left 11/22/2012    Procedure: LAPAROSCOPIC NEPHRECTOMY;  Surgeon: Dutch Gray, MD;  Location: [facility];  Service: Urology;  Laterality: Left;  . Left heart catheterization with coronary angiogram N/A 01/02/2015    Procedure: LEFT HEART CATHETERIZATION WITH CORONARY ANGIOGRAM;  Surgeon: Leonie Man, MD;  Location: [facility];  Service: Cardiovascular;  Laterality: N/A;  . Coronary artery bypass graft N/A 01/02/2015    Procedure: CORONARY ARTERY BYPASS GRAFTING (CABG)TIMES TWO USING RIGHT GREATER SAPHENOUS LEG VEIN HARVESTED ENDOSCOPICALLY;  Surgeon: [name], MD;  Location: [facility];  Service: Open Heart Surgery;  Laterality: N/A;      Medication List       This list is accurate as of: 01/12/15  1:40 PM.  Always use your most recent med list.               acetaminophen 500 MG tablet  Commonly known as:  TYLENOL  Take  1,000 mg by mouth every 6 (six) hours as needed (pain).     amiodarone 200 MG tablet  Commonly known as:  PACERONE  Take 1 tablet (200 mg total) by mouth daily.     aspirin 325 MG EC tablet  Take 1 tablet (325 mg total) by mouth daily.     atorvastatin 80 MG tablet  Commonly known as:  LIPITOR  Take 1 tablet (80 mg total) by mouth daily at 6 PM.     levothyroxine 137 MCG tablet  Commonly known as:  SYNTHROID, LEVOTHROID  Take 1 tablet (137 mcg total) by mouth daily before breakfast.     LORazepam 1 MG tablet  Commonly known as:  ATIVAN  Take 1 mg by mouth 3 (three) times daily as needed (anxiety).     metoprolol tartrate 25 MG tablet  Commonly known as:  LOPRESSOR  Take 0.5  tablets (12.5 mg total) by mouth 2 (two) times daily.     OPDIVO IV  Inject into the vein every 14 (fourteen) days.     oxyCODONE 5 MG immediate release tablet  Commonly known as:  Oxy IR/ROXICODONE  Take 1 tablet (5 mg total) by mouth every 3 (three) hours as needed for severe pain.        No orders of the defined types were placed in this encounter.    There is no immunization history for the selected administration types on file for this patient.  History  Substance Use Topics  . Smoking status: Former Smoker -- 1.50 packs/day for 40 years    Types: Cigarettes    Quit date: 09/18/2012  . Smokeless tobacco: Never Used     Comment: PT STATES THAT SHE QUIT SMOKING 09/27/11. PATIENT STATES THAT SHE HAS NOT "INHALED" IN LAST 4 YEARS.   Marland Kitchen Alcohol Use: No     Comment: Pt reports "it's rare"    Family history is noncontributory    Review of Systems  DATA OBTAINED: from patient, nurse, PT GENERAL:  no fevers, SKIN: No itching, rash; sternal incision- there is concern by PT that pt is not using sternal precautions EYES: No eye pain, redness, discharge EARS: No earache, tinnitus, change in hearing NOSE: No congestion, drainage or bleeding  MOUTH/THROAT: No mouth or tooth pain, No sore throat RESPIRATORY: No cough, wheezing, SOB CARDIAC: No chest pain, palpitations, lower extremity edema  GI: No abdominal pain, No N/V/Hernandez or constipation, No heartburn or reflux  GU: No dysuria, frequency or urgency, or incontinence  MUSCULOSKELETAL: No unrelieved bone/joint pain NEUROLOGIC: No headache, dizziness or focal weakness PSYCHIATRIC: pt is very sad, still grieving for mother who dies 1 1/2 yrs ago and dealing with stage 4 kidney cancer  Filed Vitals:   01/12/15 1331  BP: 133/84  Pulse: 68  Temp: 97.4 F (36.3 C)  Resp: 18    Physical Exam  GENERAL APPEARANCE: Alert, conversant,  No acute distress.  SKIN: incision looks clean on chest and R leg HEAD: Normocephalic, atraumatic   EYES: Conjunctiva/lids clear. Pupils round, reactive. EOMs intact.  EARS: External exam WNL, canals clear. Hearing grossly normal.  NOSE: No deformity or discharge.  MOUTH/THROAT: Lips w/o lesions  RESPIRATORY: Breathing is even, unlabored. Lung sounds are clear   CARDIOVASCULAR: Heart RRR no murmurs, rubs or gallops. RUE peripheral edema, improved, reace BLE   GASTROINTESTINAL: Abdomen is soft, non-tender, not distended w/ normal bowel sounds. GENITOURINARY: Bladder non tender, not distended  MUSCULOSKELETAL: No abnormal joints or musculature NEUROLOGIC:  Cranial nerves  2-12 grossly intact. Moves all extremities  PSYCHIATRIC: pt tearful during interview  Patient Active Problem List   Diagnosis Date Noted  . Acute blood loss anemia 01/12/2015  . Hyperglycemia 01/12/2015  . NSTEMI (non-ST elevated myocardial infarction)   . S/P CABG x 2 01/03/2015  . ACS (acute coronary syndrome) 01/02/2015  . Drug-induced hypothyroidism 03/13/2014  . S/p nephrectomy   . Adenocarcinoma, renal cell 11/12/2012  . Lung mass 10/01/2012  . Renal mass 10/01/2012  . Mass of left breast 08/01/2011    CBC    Component Value Date/Time   WBC 8.1 01/06/2015 0524   WBC 8.3 10/30/2012 1145   RBC 3.03* 01/06/2015 0524   RBC 4.56 10/30/2012 1145   HGB 8.0* 01/06/2015 0524   HGB 12.4 10/30/2012 1145   HCT 25.1* 01/06/2015 0524   HCT 36.3 10/30/2012 1145   PLT 176 01/06/2015 0524   PLT 384 10/30/2012 1145   MCV 82.8 01/06/2015 0524   MCV 79.6 10/30/2012 1145   LYMPHSABS 2.6 08/04/2014 1238   LYMPHSABS 1.9 10/30/2012 1145   MONOABS 0.7 08/04/2014 1238   MONOABS 0.7 10/30/2012 1145   EOSABS 0.1 08/04/2014 1238   EOSABS 0.3 10/30/2012 1145   BASOSABS 0.1 08/04/2014 1238   BASOSABS 0.2* 10/30/2012 1145    CMP     Component Value Date/Time   NA 135 01/06/2015 0524   NA 140 10/30/2012 1145   K 3.8 01/06/2015 0524   K 3.7 10/30/2012 1145   CL 100* 01/06/2015 0524   CL 102 10/30/2012 1145   CO2 24  01/06/2015 0524   CO2 28 10/30/2012 1145   GLUCOSE 103* 01/06/2015 0524   GLUCOSE 103* 10/30/2012 1145   BUN 21* 01/06/2015 0524   BUN 15.8 10/30/2012 1145   CREATININE 1.05* 01/06/2015 0524   CREATININE 0.89 08/04/2014 1238   CREATININE 0.8 10/30/2012 1145   CALCIUM 9.0 01/06/2015 0524   CALCIUM 10.1 10/30/2012 1145   PROT 4.3* 01/03/2015 0210   PROT 8.0 10/30/2012 1145   ALBUMIN 2.5* 01/03/2015 0210   ALBUMIN 3.2* 10/30/2012 1145   AST 59* 01/03/2015 0210   AST 11 10/30/2012 1145   ALT 23 01/03/2015 0210   ALT 13 10/30/2012 1145   ALKPHOS 34* 01/03/2015 0210   ALKPHOS 85 10/30/2012 1145   BILITOT 0.7 01/03/2015 0210   BILITOT 0.44 10/30/2012 1145   GFRNONAA 58* 01/06/2015 0524   GFRNONAA 73 08/04/2014 1238   GFRAA >60 01/06/2015 0524   GFRAA 84 08/04/2014 1238    Assessment and Plan  NSTEMI (non-ST elevated myocardial infarction) Pt presented with CP and acute inf MI, taken to cath lab, unable to get a wire to RCA, so pt was taken emergently to CABG X2, which was successful and from which pt is recovering   S/P CABG x 2 For acte inf MI for which cath and attempted PTCA were not possible;pt reported to be doing well post op    Adenocarcinoma, renal cell Stage 4 clear cell kidney CA;pt has been treated at [facility] by Dr Marcello Moores who reported good result from the last 10 rounds of chemo with regression of dx, so rec aggressive treatment CAD   S/p nephrectomy Noted, 2/2 stage 4 CA clear cell   Acute blood loss anemia Post op HB of 8.0, pt did not require a transfusion   Hyperglycemia In hospital and was tx with insulin drip but pre-op A1c was 6.1 so will probably not require insulin or any medications further   Pt is  being Hernandez/c to home with her husband, son and with HH/OT/PT. Pt and husband stated they have all DME's from caring for pt's mother for 5 years before she died.  Hennie Duos, MD

## 2015-01-12 NOTE — Assessment & Plan Note (Addendum)
For aucte inf MI for which cath and attempted PTCA were not possible;pt reported to be doing well post op; SNF rehab has concern pt is not using sternal precautions and I discussed this at length with pt and her husband l Rehab also concerned that pt is at times unsteady with gait and that she needs  assist with transitions and with ADL's and I discussed this as well with pt and husband and they understand the importance of all

## 2015-01-12 NOTE — Assessment & Plan Note (Addendum)
Stage 4 clear cell kidney CA;pt has been treated at [facility] by Dr Marcello Moores who reported good result from the  10 rounds of immunotherapy with regression of dx, so rec aggressive treatment CAD

## 2015-01-12 NOTE — Assessment & Plan Note (Signed)
Post op HB of 8.0, pt did not require a transfusion

## 2015-01-13 DIAGNOSIS — Z48812 Encounter for surgical aftercare following surgery on the circulatory system: Secondary | ICD-10-CM | POA: Diagnosis not present

## 2015-01-15 ENCOUNTER — Encounter: Payer: Self-pay | Admitting: Physician Assistant

## 2015-01-23 ENCOUNTER — Encounter: Payer: Self-pay | Admitting: Nurse Practitioner

## 2015-01-23 ENCOUNTER — Ambulatory Visit (INDEPENDENT_AMBULATORY_CARE_PROVIDER_SITE_OTHER): Payer: 59 | Admitting: Nurse Practitioner

## 2015-01-23 ENCOUNTER — Ambulatory Visit
Admission: RE | Admit: 2015-01-23 | Discharge: 2015-01-23 | Disposition: A | Discharge status: Home / Self Care | Payer: 59 | Source: Ambulatory Visit | Attending: Nurse Practitioner | Admitting: Nurse Practitioner

## 2015-01-23 VITALS — BP 100/80 | HR 91 | Ht 68.0 in | Wt 176.8 lb

## 2015-01-23 DIAGNOSIS — Z951 Presence of aortocoronary bypass graft: Secondary | ICD-10-CM

## 2015-01-23 DIAGNOSIS — R0600 Dyspnea, unspecified: Secondary | ICD-10-CM

## 2015-01-23 MED ORDER — FUROSEMIDE 20 MG PO TABS: 20.0000 mg | ORAL_TABLET | Freq: Every day | ORAL | Status: DC

## 2015-01-23 NOTE — Progress Notes (Signed)
CARDIOLOGY OFFICE NOTE  Date:  01/23/2015    Sheri Hernandez Date of Birth: Aug 30, 1956 Medical Record #[id number]  PCP:  Odette Fraction, MD  Cardiologist:  Burt Knack   Chief Complaint  Patient presents with  . FU after NSTEMI with emergent CABG    Post hospital visit - seen for Dr. Burt Knack    History of Present Illness: Sheri Hernandez is a 59 y.o. female who presents today for a post hospital visit. Seen for Dr. Burt Knack. She has a history of metastatic renal cell carcinoma - followed at [facility]. Other issues include asthma and sarcoid.  Presented earlier this month with chest pain - + troponin - was cathed urgently - Dr. Servando Snare was called urgently to cath lab to see patient with severe chest pain and EKG changes of inferior MI.  Multiple attempts to get wire across the right coronary unsuccessful. Patient has stage IV renal cell cancer. Case was discussed with Dr.  Marcello Moores oncologist [facility], and patient had seen recent regression of disease with Nevibnib x 10 doses. With response seen by oncology we were encouraged short term to treat cad aggressively.  Comes in today. Here with her husband. She is quite tearful. Crying. Feels swollen and is short of breath. Not sleeping at night but sleeps during the day. Says she is too scared to go to sleep (but can go to sleep during the day). Checked out of [employer]. That did not go well. Her sternum is sore. Not wearing her bra. Legs swollen. Not happy that she is not seeing Dr. Servando Snare on return. Was at [facility] earlier this week. Had some labs and those were reviewed. Her last TSH was in April - prior to her MI - TSh was 0.122 at that time. Now on high dose Lipitor. Pretty inactive prior to this event.    Past Medical History  Diagnosis Date  . Papilloma of breast 2012    left. lumpectomy  . Anxiety   . Complication of anesthesia     hard time waking up 20 yrs. ago when having wisdon teeth removed  . Hypertension   . Shortness of  breath     WITH EXERTION  . 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  . Lung mass     LUNG MASSES--COUGH-BIOPSY NON-DIAGNOSTIC.  HX OF MEDIASTINAL MASS AND LUNG MASSES AGE 89 -NEGATIVE BIOSPY-BUT GIVEN DIAGNOSIS OF SARCOIDOSIS.  WORK UP CONTINUING ON DIAGNOSIS FOR PT'S LUNG MASSES--SHE HAS BIOPSY PROVEN KIDNEY CANCER  . S/p nephrectomy     left, due to metastatic RCC, 3/14  . Cancer   . Asthma     Past Surgical History  Procedure Laterality Date  . Tonsillectomy  1967  . Bronchial brush biopsy    . Wids    . Wisdom tooth extraction    . Neck lesion biopsy    . Breast biopsy  08/18/2011    Procedure: BREAST BIOPSY WITH NEEDLE LOCALIZATION;  Surgeon: Merrie Roof, MD;  Location: [facility];  Service: General;  Laterality: Left;  left breast biopsy with needle localization  . Video bronchoscopy  10/11/2012    Procedure: VIDEO BRONCHOSCOPY WITH FLUORO;  Surgeon: Kathee Delton, MD;  Location: [facility];  Service: Cardiopulmonary;  Laterality:  Bilateral;  . Laparoscopic nephrectomy Left 11/22/2012    Procedure: LAPAROSCOPIC NEPHRECTOMY;  Surgeon: Dutch Gray, MD;  Location: [facility];  Service: Urology;  Laterality: Left;  . Left heart catheterization with coronary angiogram N/A 01/02/2015    Procedure: LEFT HEART CATHETERIZATION WITH CORONARY ANGIOGRAM;  Surgeon: Leonie Man, MD;  Location: [facility] CATH LAB;  Service: Cardiovascular;  Laterality: N/A;  . Coronary artery bypass graft N/A 01/02/2015    Procedure: CORONARY ARTERY BYPASS GRAFTING (CABG)TIMES TWO USING RIGHT GREATER SAPHENOUS LEG VEIN HARVESTED ENDOSCOPICALLY;  Surgeon: [name], MD;  Location: [facility];  Service: Open Heart Surgery;  Laterality: N/A;     Medications: Current Outpatient Prescriptions  Medication Sig Dispense  Refill  . amiodarone (PACERONE) 200 MG tablet Take 1 tablet (200 mg total) by mouth daily.    Marland Kitchen aspirin EC 325 MG EC tablet Take 1 tablet (325 mg total) by mouth daily. 30 tablet 0  . atorvastatin (LIPITOR) 80 MG tablet Take 1 tablet (80 mg total) by mouth daily at 6 PM.    . HYDROcodone-acetaminophen (NORCO/VICODIN) 5-325 MG per tablet Take 1 tablet by mouth every 4 (four) hours as needed for moderate pain.    Marland Kitchen levothyroxine (SYNTHROID, LEVOTHROID) 137 MCG tablet Take 1 tablet (137 mcg total) by mouth daily before breakfast. 30 tablet 5  . LORazepam (ATIVAN) 1 MG tablet Take 1 mg by mouth 3 (three) times daily as needed (anxiety).     . metoprolol tartrate (LOPRESSOR) 25 MG tablet Take 0.5 tablets (12.5 mg total) by mouth 2 (two) times daily. (Patient taking differently: Take 25 mg by mouth 2 (two) times daily. )    . nitroGLYCERIN (NITROSTAT) 0.4 MG SL tablet Place 0.4 mg under the tongue every 5 (five) minutes as needed for chest pain.    . Nivolumab (OPDIVO IV) Inject into the vein every 14 (fourteen) days.     Marland Kitchen oxyCODONE (OXY IR/ROXICODONE) 5 MG immediate release tablet Take 1 tablet (5 mg total) by mouth every 3 (three) hours as needed for severe pain. 30 tablet 0  . furosemide (LASIX) 20 MG tablet Take 1 tablet (20 mg total) by mouth daily. Daily for one week and then stop 30 tablet 3   No current facility-administered medications for this visit.    Allergies: No Known Allergies  Social History: The patient  reports that she quit smoking about 2 years ago. Her smoking use included Cigarettes. She has a 60 pack-year smoking history. She has never used smokeless tobacco. She reports that she does not drink alcohol or use illicit drugs.   Family History: The patient's family history includes Cancer in her maternal aunt, maternal grandfather, and maternal uncle; Heart disease in her brother, father, maternal grandmother, and mother; Stroke in her brother.   Review of Systems: Please  see the history of present illness.   Otherwise, the review of systems is positive for fatigue/dyspnea/swelling. No cough or fever.   All other systems are reviewed and negative.   Physical Exam: VS:  BP 100/80 mmHg  Pulse 91  Ht '5\' 8"'$  (1.727 m)  Wt 176 lb 12.8 oz (80.196 kg)  BMI 26.89 kg/m2 .  BMI Body mass index is 26.89 kg/(m^2).  Wt Readings from Last 3 Encounters:  01/23/15 176 lb 12.8 oz (80.196 kg)  01/09/15 187 lb 1.6 oz (84.868 kg)  12/29/14 175 lb (79.379 kg)    General: Tearful. She is in no acute distress. Little disheveled in appearance.   HEENT: Normal.  Neck: Supple, no JVD, carotid bruits, or masses noted.  Cardiac: Regular rate and rhythm. No murmurs, rubs, or gallops. +1 edema right > left.Marland Kitchen  Respiratory:  Lungs with decreased breath sounds but with normal work of breathing.  GI: Soft and nontender.  MS: No deformity or atrophy. Gait and ROM intact. Skin: Warm and dry. Color is normal.  Neuro:  Strength and sensation are intact and no gross focal deficits noted.  Psych: Alert, appropriate and with normal affect.   LABORATORY DATA:  EKG:  EKG is ordered today. This demonstrates NSR with T wave changes.  Lab Results  Component Value Date   WBC 8.1 01/06/2015   HGB 8.0* 01/06/2015   HCT 25.1* 01/06/2015   PLT 176 01/06/2015   GLUCOSE 103* 01/06/2015   CHOL 90 01/03/2015   TRIG 77 01/03/2015   HDL 15* 01/03/2015   LDLCALC 60 01/03/2015   ALT 23 01/03/2015   AST 59* 01/03/2015   NA 135 01/06/2015   K 3.8 01/06/2015   CL 100* 01/06/2015   CREATININE 1.05* 01/06/2015   BUN 21* 01/06/2015   CO2 24 01/06/2015   TSH 0.262* 01/03/2015   INR 1.62* 01/03/2015   HGBA1C 6.1* 01/03/2015    BNP (last 3 results) No results for input(s): BNP in the last 8760 hours.  ProBNP (last 3 results) No results for input(s): PROBNP in the last 8760 hours.   Other Studies Reviewed Today: Coronary angiography: Coronary dominance: Right   Left Main:  Normal  Left Anterior Descending (LAD): Moderately calcified and normal in size. There is diffuse 20% disease proximally. There is diffuse 40% disease in the midsegment.  1st diagonal (D1): Small in size with minor irregularities.  2nd diagonal (D2): Large in size with 20% ostial stenosis.  3rd diagonal (D3): Very small in size.  Circumflex (LCx): Normal in size and nondominant. The vessel is moderately calcified. There is a 95% proximal stenosis and 80% mid stenosis.  1st obtuse marginal: Small in size with minor irregularities.  2nd obtuse marginal: Medium in size with minor irregularities.  3rd obtuse marginal: Medium in size with minor irregularities.   Right Coronary Artery: Large in size and dominant. The vessel is heavily calcified in the midsegment. It is aneurysmal in the mid and distal segment. There is 95% proximal stenosis followed by diffuse 80% mid stenosis. There is an 80% stenosis distally between 2 aneurysmal segments.  Posterior descending artery: Normal in size with 95% proximal stenosis.  Posterior AV segment: Normal  Posterolateral branchs: PL 1 is small in size. PL 2 is very large in size with diffuse 50% proximal stenosis.  Left ventriculography: Left ventricular systolic function is (normal , LVEF is estimated at 55 %, there is mild mitral regurgitation   TEE Study Conclusions from 12/2014  - Left ventricle: Systolic function was mildly reduced. The estimated ejection fraction was in the range of 45% to 50%. Hypokinesis of the inferoseptal myocardium. Hypokinesis of the inferior myocardium. - Mitral valve: There was mild regurgitation originating from the central commissure and directed centrally. - Staged echo: Limited post CPB exam: Improved LV function, EF 50-55%. There is only mild inferior wall hypokinesis. No change post bypass in aortic valve function. No significant change in mitral valve function post bypass. The MR  is only trivial.  Impressions:  - LV function improvement from pre-bypass images. No other significant change from pre-bypass images.  EMERGENT CORONARY ARTERY BYPASS GRAFTING x 2 -SEQUENTIAL SVG to PDA, PLVB by Dr. Servando Snare on 01/02/2015.  Assessment/Plan: 1. Inferior MI with failed attempt at PCI - s/p emergent CABG x 2 - very slow progress - was not very active prior to this event. Will needs lots of rehab. Doubt she will get to cardiac rehab but will discuss on return. She has had recent labs - I have not repeated. HCT 24 with HGB 9.4. BUN 18. CREAT .8. She does have swelling. Decreased breath sounds - will give her one week of Lasix. Send for her CXR today.   2. Metastatic renal cell carcinoma - followed by [facility]  3. Depression  4. PAF - on amiodarone - down to 200 mg a day - would stop on return. Need to remain mindful about her thyroid studies from April.   Current medicines are reviewed with the patient today.  The patient does not have concerns regarding medicines other than what has been noted above.  The following changes have been made:  See above.  Labs/ tests ordered today include:    Orders Placed This Encounter  Procedures  . DG Chest 2 View  . EKG 12-Lead     Disposition:   FU with Dr. Burt Knack in 1 month with fasting labs. Will try to get her a follow up with EBG as well.   Patient is agreeable to this plan and will call if any problems develop in the interim.   Signed: Burtis Junes, RN, ANP-C 01/23/2015 12:24 PM  [facility] [address] [place], [place]  [address] Phone: [phone] Fax: [phone]

## 2015-01-23 NOTE — Patient Instructions (Addendum)
We will be checking the following labs today - NONE  Please go to [facility] to [facility] on the first floor for a chest Xray - you may walk in.   Medication Instructions:    Continue with your current medicines.   I am adding Lasix 20 mg a day - take for one week only    Testing/Procedures To Be Arranged:  N/A  Follow-Up:   See me or Dr. Burt Knack in one month with fasting labs     Other Special Instructions:   Start wearing your bra  Keep working with PT  Call the [facility] office at [phone] if you have any questions, problems or concerns.

## 2015-02-03 ENCOUNTER — Telehealth: Payer: Self-pay | Admitting: Cardiovascular Disease

## 2015-02-03 NOTE — Telephone Encounter (Signed)
Left message on machine for pt to contact the office.   

## 2015-02-03 NOTE — Telephone Encounter (Signed)
I spoke with the pt and she continues to have swelling in her right foot and has not had any improvement while taking Lasix. The pt denies SOB at this time. The pt has elevated her legs and watchs the salt in her diet.  I made the pt aware that her right leg was a harvest site for CABG and swelling can occur following surgery. The pt would like to know is she should continue lasix.  She would also like to know if there is anything else she can do for swelling (i.e. Compression stockings).  The pt has a pending appointment with [facility] on 02/09/15.  I will forward this message to Dr Burt Knack to review and make further recommendations.

## 2015-02-03 NOTE — Telephone Encounter (Signed)
New Message  Pt wants to clarify rx because swqelling has not changed. Please call back and discuss.   Pt c/o swelling: STAT is pt has developed SOB within 24 hours  1. How long have you been experiencing swelling? 10 days  2. Where is the swelling located? Right foot   3.  Are you currently taking a "fluid pill"? Yes (took for 10 days)  4.  Are you currently SOB? no  5.  Have you traveled recently? no

## 2015-02-04 NOTE — Telephone Encounter (Signed)
I spoke with the pt and made her aware of Dr Antionette Char recommendation.  At this time the pt is going to stop taking Lasix '20mg'$  daily.  She is scheduled to follow-up with [facility] on 02/09/15.

## 2015-02-04 NOTE — Telephone Encounter (Signed)
Would continue with leg elevation and could try light compression stockings. If unable to wear or if not effective would try compression device. If no change with lasix would recommend stopping it.

## 2015-02-06 ENCOUNTER — Other Ambulatory Visit: Payer: Self-pay | Admitting: Cardiothoracic Surgery

## 2015-02-06 DIAGNOSIS — Z951 Presence of aortocoronary bypass graft: Secondary | ICD-10-CM

## 2015-02-09 ENCOUNTER — Ambulatory Visit (INDEPENDENT_AMBULATORY_CARE_PROVIDER_SITE_OTHER): Payer: Self-pay | Admitting: Physician Assistant

## 2015-02-09 ENCOUNTER — Ambulatory Visit
Admission: RE | Admit: 2015-02-09 | Discharge: 2015-02-09 | Disposition: A | Discharge status: Home / Self Care | Payer: 59 | Source: Ambulatory Visit | Attending: Cardiothoracic Surgery | Admitting: Cardiothoracic Surgery

## 2015-02-09 VITALS — BP 130/80 | HR 82 | Resp 20 | Ht 68.0 in | Wt 176.0 lb

## 2015-02-09 DIAGNOSIS — Z951 Presence of aortocoronary bypass graft: Secondary | ICD-10-CM

## 2015-02-09 NOTE — Progress Notes (Addendum)
[address]       [place] [address]             [phone]     01/02/2015 OPERATIVE REPORT PREOPERATIVE DIAGNOSIS: Evolving inferior myocardial infarction, acute. POSTOPERATIVE DIAGNOSIS: Evolving inferior myocardial infarction, acute. SURGICAL PROCEDURE: Emergency coronary artery bypass grafting x2 with greater saphenous vein from the right thigh to the posterior descending and posterolateral branches of the right coronary artery.   HPI:  Patient returns for routine postoperative follow-up having undergone Emergent CABG x 2 on 4/292016. The patient's early postoperative recovery while in the hospital was notable for Atrial Fibrillation.  She was discharged to a SNF per patient request.  Since hospital discharge the patient reports she is doing okay.  She does complain of swelling in her right leg and foot.  She states this has not gotten any better.  She received a trial of Lasix prescribed by Sheri Merle NP which did not provide much relief.  She has not yet utilized compression stockings stating she has not been able to find any.  She states that she has had ultrasound studies of that leg order by her Oncologist, all of which have been negative for blood clot.  She continues to have some episodes of chest discomfort.  She also has some sensitivity across her chest to fabrics and the seat belt in the car.  Finally she states that she does not breath?.  She denies shortness of breath, but states she will just stop breathing periodically.  She is ambulating without difficulty.  Her appetite is good and bowel function has returned to normal.   Current Outpatient Prescriptions  Medication Sig Dispense Refill  . amiodarone (PACERONE) 200 MG tablet Take 1 tablet (200 mg total) by mouth daily.    Marland Kitchen aspirin EC 325 MG EC tablet Take 1 tablet (325 mg total) by mouth daily. 30 tablet 0  . atorvastatin (LIPITOR) 80 MG tablet Take 1 tablet (80 mg total) by mouth daily  at 6 PM.    . HYDROcodone-acetaminophen (NORCO/VICODIN) 5-325 MG per tablet Take 1 tablet by mouth every 4 (four) hours as needed for moderate pain.    Marland Kitchen levothyroxine (SYNTHROID, LEVOTHROID) 137 MCG tablet Take 1 tablet (137 mcg total) by mouth daily before breakfast. 30 tablet 5  . LORazepam (ATIVAN) 1 MG tablet Take 1 mg by mouth 3 (three) times daily as needed (anxiety).     . metoprolol tartrate (LOPRESSOR) 25 MG tablet Take 0.5 tablets (12.5 mg total) by mouth 2 (two) times daily. (Patient taking differently: Take 25 mg by mouth 2 (two) times daily. )    . nitroGLYCERIN (NITROSTAT) 0.4 MG SL tablet Place 0.4 mg under the tongue every 5 (five) minutes as needed for chest pain.    . Nivolumab (OPDIVO IV) Inject into the vein every 14 (fourteen) days.     Marland Kitchen oxyCODONE (OXY IR/ROXICODONE) 5 MG immediate release tablet Take 1 tablet (5 mg total) by mouth every 3 (three) hours as needed for severe pain. 30 tablet 0   No current facility-administered medications for this visit.   Physical Exam:  BP 130/80 mmHg  Pulse 82  Resp 20  Ht '5\' 8"'$  (1.727 m)  Wt 176 lb (79.833 kg)  BMI 26.77 kg/m2  SpO2 96%  Gen: looks great, no apparent distress Heart: RRR Lungs: CTA Bilaterally Abd: soft non-tender, +umbilical/incisional hernia present Skin: incisions well healed, some residual glue present on EVH incisions, small hematoma along  right thigh  Diagnostic Tests:  CXR: post operative changes,  Near complete resolution of left sided pleural effusion  A/P:  1. S/P Emergent CABG- doing very well patient looks great 2. RLE swelling- does not appear to be due to hypervolemia, has been R/O for DVT by Oncologist- recommend patient purchase compression stockings via health supply store, but reminded patient can take some time for swelling to decrease after EVH 3. Stage IV Renal Cell CA- per [facility] 4. Dispo- patient is doing very well overall.  She is experiencing expected chest discomfort associated  with surgery.  Her symptoms should improve once nerve has fully recovered.  Her RLE edema is not related to hypervolemia, but likely due to [facility].  This will take some time to improve.  Encouraged patient to continue ambulating and follow up with Cardiology as instructed.  RTC prn  Ellwood Handler, PA-C [facility] [phone]  I have seen and examined Sheri Hernandez and agree with the above assessment  and plan.  [name] MD [facility] [phone] Office [phone] 02/09/2015 3:00 PM

## 2015-02-10 ENCOUNTER — Telehealth: Payer: Self-pay | Admitting: Family Medicine

## 2015-02-10 ENCOUNTER — Other Ambulatory Visit: Payer: Self-pay | Admitting: *Deleted

## 2015-02-10 DIAGNOSIS — I4891 Unspecified atrial fibrillation: Secondary | ICD-10-CM

## 2015-02-10 MED ORDER — AMIODARONE HCL 200 MG PO TABS: 200.0000 mg | ORAL_TABLET | Freq: Every day | ORAL | Status: DC

## 2015-02-10 MED ORDER — ATORVASTATIN CALCIUM 80 MG PO TABS: 80.0000 mg | ORAL_TABLET | Freq: Every day | ORAL | Status: DC

## 2015-02-10 NOTE — Telephone Encounter (Signed)
Melissa an occupational therapist calling to speak about this patient and some blood pressure and stress issues she is having  [phone]

## 2015-02-11 ENCOUNTER — Telehealth: Payer: Self-pay | Admitting: Cardiovascular Disease

## 2015-02-11 NOTE — Telephone Encounter (Signed)
lmtrc

## 2015-02-11 NOTE — Telephone Encounter (Signed)
New Prob    Pt has labs drawn every 2 weeks and does not wish to have labs dawn for Mondays appointment. States results can be obtained in EPIC.

## 2015-02-11 NOTE — Telephone Encounter (Signed)
I spoke with the pt and she has labs drawn every two weeks at [facility].  The pt said she is going to have her lipid/liver checked at [facility] tomorrow so that she does not have to get stuck multiple times.

## 2015-02-12 ENCOUNTER — Encounter: Payer: Self-pay | Admitting: *Deleted

## 2015-02-13 NOTE — Telephone Encounter (Signed)
Melissa from occupational therapist called back and stated that pt was having elevated BP and some stress issues and wanted to let us know she also stated that she would not be working with pt this week and wanted me to call pt and let her know what action needed to be done. I called pt and she stated that she did have elevated BP that day but everything calmed down the next day and went to [facility] oncology yesterday and everything looked good. I advised pt to call us if her BP was high again.

## 2015-02-16 ENCOUNTER — Encounter: Payer: Self-pay | Admitting: Cardiovascular Disease

## 2015-02-16 ENCOUNTER — Ambulatory Visit (INDEPENDENT_AMBULATORY_CARE_PROVIDER_SITE_OTHER): Payer: 59 | Admitting: Cardiovascular Disease

## 2015-02-16 VITALS — BP 140/94 | HR 81 | Ht 68.0 in | Wt 175.0 lb

## 2015-02-16 DIAGNOSIS — R002 Palpitations: Secondary | ICD-10-CM

## 2015-02-16 DIAGNOSIS — R609 Edema, unspecified: Secondary | ICD-10-CM | POA: Diagnosis not present

## 2015-02-16 DIAGNOSIS — I2510 Atherosclerotic heart disease of native coronary artery without angina pectoris: Secondary | ICD-10-CM | POA: Diagnosis not present

## 2015-02-16 MED ORDER — METOPROLOL TARTRATE 25 MG PO TABS: 12.5000 mg | ORAL_TABLET | Freq: Two times a day (BID) | ORAL | Status: DC

## 2015-02-16 MED ORDER — ATORVASTATIN CALCIUM 40 MG PO TABS: 40.0000 mg | ORAL_TABLET | Freq: Every day | ORAL | Status: DC

## 2015-02-16 NOTE — Patient Instructions (Addendum)
Medication Instructions:  Your physician has recommended you make the following change in your medication:  1. STOP Amiodarnone 2. DECREASE Atorvastatin to '40mg'$  take one by mouth daily  Labwork: No new orders.   Testing/Procedures: No new orders.   Follow-Up: Your physician recommends that you schedule a follow-up appointment in: 62 WEEKS with PA/NP  Your physician wants you to follow-up in: 6 MONTHS with Dr Burt Knack.  You will receive a reminder letter in the mail two months in advance. If you don't receive a letter, please call our office to schedule the follow-up appointment.   Any Other Special Instructions Will Be Listed Below (If Applicable).  Order given to the patient for compression stockings.

## 2015-02-16 NOTE — Progress Notes (Signed)
Cardiology Office Note   Date:  02/16/2015   ID:  Sheri Hernandez, DOB August 30, 1956, MRN [id number]  PCP:  Odette Fraction, MD  Cardiologist:  Sherren Mocha, MD    Chief Complaint  Patient presents with  . Edema    History of Present Illness: Sheri Hernandez is a 59 y.o. female who presents for follow-up of coronary artery disease. The patient has a history of metastatic renal cell carcinoma. She presented in May 2016 with acute coronary syndrome. She was found to have non-ST elevation infarction and classic symptoms of crescendo angina. Cardiac catheterization demonstrated a critical RCA stenosis. An attempted PCI was made but there was a large dissection and subsequent acute inferior STEMI. The patient was taken emergently for CABG by Dr. Servando Snare. She underwent saphenous vein bypass to the right PDA and PLA branches.  She is participating in physical and occupational therapy at home. She reports that she's doing well. She is to be discharged and plans on enrolling in Phase 2 Cardiac Rehab.   The patient complains of leg swelling, right greater than left, since the time of surgery. She has had 2 separate venous duplex studies performed, both of which are negative. Impression hose have been recommended that she has not been to the medical supply store yet. She complains of occasional heart palpitations but denies any sustained tachycardia palpitations. She also has pain in the left back and flank area with plans for repeat imaging her her oncologist.  The patient was seen June 9 by Dr. Marcello Moores at [facility] for follow-up of her metastatic renal cell carcinoma. I have reviewed those records, including lab work. Her cholesterol values are very low with a total cholesterol of 90.  Past Medical History  Diagnosis Date  . Papilloma of breast 2012    left. lumpectomy  . Anxiety   . Complication of anesthesia     hard time waking up 20 yrs. ago when having wisdon teeth removed    . Hypertension   . Shortness of breath     WITH EXERTION  . 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  . Lung mass     LUNG MASSES--COUGH-BIOPSY NON-DIAGNOSTIC.  HX OF MEDIASTINAL MASS AND LUNG MASSES AGE 22 -NEGATIVE BIOSPY-BUT GIVEN DIAGNOSIS OF SARCOIDOSIS.  WORK UP CONTINUING ON DIAGNOSIS FOR PT'S LUNG MASSES--SHE HAS BIOPSY PROVEN KIDNEY CANCER  . S/p nephrectomy     left, due to metastatic RCC, 3/14  . Cancer   . Asthma     Past Surgical History  Procedure Laterality Date  . Tonsillectomy  1967  . Bronchial brush biopsy    . Wids    . Wisdom tooth extraction    . Neck lesion biopsy    . Breast biopsy  08/18/2011    Procedure: BREAST BIOPSY WITH NEEDLE LOCALIZATION;  Surgeon: Merrie Roof, MD;  Location: [facility];  Service: General;  Laterality: Left;  left breast biopsy with needle localization  . Video bronchoscopy  10/11/2012    Procedure: VIDEO BRONCHOSCOPY WITH FLUORO;  Surgeon: Kathee Delton, MD;  Location: [facility];  Service: Cardiopulmonary;  Laterality: Bilateral;  . Laparoscopic nephrectomy Left 11/22/2012    Procedure: LAPAROSCOPIC NEPHRECTOMY;  Surgeon: Dutch Gray, MD;  Location: [facility];  Service: Urology;  Laterality: Left;  . Left heart catheterization with coronary angiogram N/A 01/02/2015    Procedure: LEFT HEART CATHETERIZATION WITH CORONARY ANGIOGRAM;  Surgeon: Leonie Man, MD;  Location: [facility] CATH LAB;  Service: Cardiovascular;  Laterality: N/A;  . Coronary artery bypass graft N/A 01/02/2015    Procedure: CORONARY ARTERY BYPASS GRAFTING (CABG)TIMES TWO USING RIGHT GREATER SAPHENOUS LEG VEIN HARVESTED ENDOSCOPICALLY;  Surgeon: [name], MD;  Location: [facility];  Service: Open Heart Surgery;  Laterality: N/A;    Current Outpatient Prescriptions   Medication Sig Dispense Refill  . amiodarone (PACERONE) 200 MG tablet Take 1 tablet (200 mg total) by mouth daily. 30 tablet 0  . aspirin EC 325 MG EC tablet Take 1 tablet (325 mg total) by mouth daily. 30 tablet 0  . atorvastatin (LIPITOR) 80 MG tablet Take 1 tablet (80 mg total) by mouth daily. 30 tablet 0  . HYDROcodone-acetaminophen (NORCO/VICODIN) 5-325 MG per tablet Take 1 tablet by mouth every 4 (four) hours as needed for moderate pain.    Marland Kitchen levothyroxine (SYNTHROID, LEVOTHROID) 137 MCG tablet Take 1 tablet (137 mcg total) by mouth daily before breakfast. 30 tablet 5  . LORazepam (ATIVAN) 1 MG tablet Take 1 mg by mouth every 6 (six) hours as needed for anxiety.    . metoprolol tartrate (LOPRESSOR) 25 MG tablet Take 0.5 tablets (12.5 mg total) by mouth 2 (two) times daily.    . nitroGLYCERIN (NITROSTAT) 0.4 MG SL tablet Place 0.4 mg under the tongue every 5 (five) minutes as needed for chest pain.    . Nivolumab (OPDIVO IV) Inject into the vein every 14 (fourteen) days.     Marland Kitchen oxyCODONE (OXY IR/ROXICODONE) 5 MG immediate release tablet Take 1 tablet (5 mg total) by mouth every 3 (three) hours as needed for severe pain. 30 tablet 0  . furosemide (LASIX) 20 MG tablet Take 20 mg by mouth daily. Take one 20 mg tablet by mouth daily for one week then stop.     No current facility-administered medications for this visit.    Allergies:   Review of patient's allergies indicates no known allergies.   Social History:  The patient  reports that she quit smoking about 2 years ago. Her smoking use included Cigarettes. She has a 60 pack-year smoking history. She has never used smokeless tobacco. She reports that she does not drink alcohol or use illicit drugs.   Family History:  The patient's  family history includes Breast cancer in her maternal aunt; Cancer in her maternal grandfather; Colon cancer in her maternal aunt; Heart attack in her father; Heart disease in her brother, father, maternal  grandmother, and mother; Skin cancer in her maternal uncle; Stomach cancer in her maternal uncle; Stroke in her brother.    ROS:  Please see the history of present illness.  Otherwise, review of systems is positive for leg swelling, cough, depression, mild wheezing, anxiety.  All other systems are reviewed and negative.    PHYSICAL EXAM: VS:  BP 140/94 mmHg  Pulse 81  Ht '5\' 8"'$  (1.727 m)  Wt 175 lb (79.379 kg)  BMI 26.61 kg/m2 , BMI Body mass index is 26.61 kg/(m^2). GEN: Well nourished, well developed, in no acute distress HEENT: normal Neck: no JVD, no masses. No carotid bruits Cardiac: RRR without murmur or gallop                Respiratory:  clear to auscultation bilaterally, normal work of breathing GI:  soft, nontender, nondistended, + BS MS: no deformity or atrophy Ext: 2+ pretibial edema into the foot on the right, trace on the left, pedal pulses 2+= bilaterally Skin: warm and dry, no rash Neuro:  Strength and sensation are intact Psych: euthymic mood, full affect  EKG:  EKG is ordered today. The ekg ordered today shows normal sinus rhythm, prolonged QT with QTC 485 ms, nonspecific T wave changes  Recent Labs: 01/03/2015: ALT 23; TSH 0.262* 01/04/2015: Magnesium 2.0 01/06/2015: BUN 21*; Creatinine, Ser 1.05*; Hemoglobin 8.0*; Platelets 176; Potassium 3.8; Sodium 135   Lipid Panel     Component Value Date/Time   CHOL 90 01/03/2015 0210   TRIG 77 01/03/2015 0210   HDL 15* 01/03/2015 0210   CHOLHDL 6.0 01/03/2015 0210   VLDL 15 01/03/2015 0210   LDLCALC 60 01/03/2015 0210      Wt Readings from Last 3 Encounters:  02/16/15 175 lb (79.379 kg)  02/09/15 176 lb (79.833 kg)  01/23/15 176 lb 12.8 oz (80.196 kg)    ASSESSMENT AND PLAN: 1.  CAD, native vessel: The patient is recovering from emergency CABG, now 6 weeks out from surgery. Overall she appears stable with an expected slow recovery due to baseline sedentary lifestyle and metastatic renal cell carcinoma. I reviewed  her medications today. I think we can stop amiodarone now that she is in sinus rhythm 6 weeks out from surgery. Will also reduce atorvastatin to 40 mg daily in the setting of her low cholesterol. No other medication changes were made. Reviewed expected recovery from CABG and she understands that it may take 6 months for her to feel good again. I think cardiac rehabilitation will be very helpful and she plans on enrolling soon.  2. Hypertension: Home blood pressures have been in range. She will be monitored a cardiac rehabilitation. Will titrate medications as needed.  3. Hyperlipidemia: Recent lipid panel reviewed through care everywhere. Reduce atorvastatin dose by 50%.  4. Postoperative atrial fibrillation: Maintaining sinus rhythm 6 weeks out from surgery. Discontinue amiodarone.  5. Lower extremity edema, suspect related to vein harvesting. Prescription for compression stockings written.   Current medicines are reviewed with the patient today.  The patient does not have concerns regarding medicines.  Labs/ tests ordered today include:  No orders of the defined types were placed in this encounter.    Disposition:   FU PA/NP 6 weeks  Signed, Sherren Mocha, MD  02/16/2015 9:15 AM    [address], [place], [place]  [address] Phone: [phone]; Fax: [phone]

## 2015-02-20 ENCOUNTER — Telehealth: Payer: Self-pay | Admitting: Cardiovascular Disease

## 2015-02-20 NOTE — Telephone Encounter (Signed)
New message      Pt has a question regarding presc for compression stocking

## 2015-02-20 NOTE — Telephone Encounter (Signed)
Wanted to know what length hose she needed, knee or thigh high.  Advised thigh high and explained problems associated with knee high compression stockings.  Concerned that she would have to where them life long and wants to know what Dr. Burt Knack means.  Explained to her that he would like to reduce the swelling in her leg and that sometimes swelling will be there for a short time and sometimes and can be a very long period.  Understands.

## 2015-02-23 ENCOUNTER — Telehealth: Payer: Self-pay | Admitting: Cardiovascular Disease

## 2015-02-23 NOTE — Telephone Encounter (Signed)
New Message      Pt calling stating that she can't get into the compression stockings that Dr. Burt Knack told her to get. She states that she can get into the 12/18 ones. Please call back and advise.

## 2015-02-24 NOTE — Telephone Encounter (Signed)
I spoke with the pt and she was unable to get the light compression stocking on that Dr Burt Knack had prescribed.  The medical supply store did have a lower strength of 12-85mHg and the pt was able to tolerate this compression stocking.  The pt wanted to make sure that this was okay.  I made her aware that this was fine and we are happy that she found a compression stocking that would work.

## 2015-03-03 ENCOUNTER — Ambulatory Visit (HOSPITAL_COMMUNITY): Payer: 59

## 2015-03-03 ENCOUNTER — Encounter (HOSPITAL_COMMUNITY)
Admission: RE | Admit: 2015-03-03 | Discharge: 2015-03-03 | Disposition: A | Discharge status: Home / Self Care | Payer: 59 | Source: Ambulatory Visit | Attending: Cardiovascular Disease | Admitting: Cardiovascular Disease

## 2015-03-03 NOTE — Progress Notes (Signed)
Pt came in today for orientation.  Upon arriving, she was distressed as this was the first time, other than her heart event, that she had been in the hospital since her mother passed away here.  She mentioned that walking the hallways brought back some strong emotions.  Pt was given her informed consent, fall survey, and nutrition screening for review.  She completed fall survey and screening, but had a few concerns with our consent.  She was not pleased about wearing a name tag but was willing to wear her ID sticker per hospital policy.  The risks of exercise presented to the pt scared her.  She admitted to not being an exercise person, including getting out of gym class as a child.  She called her husband for advice, and then insurance came into question as she felt that she could not afford the weekly copays as compared to going to the [facility].  I assured the pt that we only bill for the visits that she attends and that she has up to a year to start the program.  After, discussing her fears and copays with her husband, she decided that Cardiac Rehab was just not for her at this time.    Alberteen Sam, MA, ACSM RCEP

## 2015-03-05 ENCOUNTER — Ambulatory Visit (HOSPITAL_COMMUNITY): Payer: 59

## 2015-03-11 ENCOUNTER — Encounter (HOSPITAL_COMMUNITY): Payer: 59

## 2015-03-13 ENCOUNTER — Encounter (HOSPITAL_COMMUNITY): Payer: 59

## 2015-03-16 ENCOUNTER — Encounter (HOSPITAL_COMMUNITY): Payer: 59

## 2015-03-18 ENCOUNTER — Encounter: Payer: Self-pay | Admitting: Physician Assistant

## 2015-03-18 ENCOUNTER — Ambulatory Visit (INDEPENDENT_AMBULATORY_CARE_PROVIDER_SITE_OTHER): Payer: 59 | Admitting: Physician Assistant

## 2015-03-18 ENCOUNTER — Encounter (HOSPITAL_COMMUNITY): Payer: 59

## 2015-03-18 VITALS — BP 130/80 | HR 82 | Temp 97.5°F | Resp 18 | Wt 177.0 lb

## 2015-03-18 DIAGNOSIS — K219 Gastro-esophageal reflux disease without esophagitis: Secondary | ICD-10-CM | POA: Diagnosis not present

## 2015-03-18 DIAGNOSIS — M542 Cervicalgia: Secondary | ICD-10-CM | POA: Diagnosis not present

## 2015-03-18 HISTORY — DX: Gastro-esophageal reflux disease without esophagitis: K21.9

## 2015-03-18 MED ORDER — CYCLOBENZAPRINE HCL 10 MG PO TABS: 10.0000 mg | ORAL_TABLET | Freq: Three times a day (TID) | ORAL | Status: DC | PRN

## 2015-03-18 MED ORDER — OMEPRAZOLE 20 MG PO CPDR: 20.0000 mg | DELAYED_RELEASE_CAPSULE | Freq: Every day | ORAL | Status: DC

## 2015-03-18 NOTE — Progress Notes (Signed)
Patient ID: Sheri Hernandez MRN: [id number], DOB: 08-25-1956, 59 y.o. Date of Encounter: 03/18/2015, 11:29 AM    Chief Complaint:  Chief Complaint  Patient presents with  . Shoulder Pain    x last night around 10:30, states radiating down to her left side breast, now is c/o numbness but comes and goes     HPI: 59 y.o. year old female presents with above. Says that she had bypass surgery on 01/02/15. Was discharged home on 01/13/15. Says ever since that time that she was discharged home on 01/13/15 she has been having achiness in her left neck and left shoulder area. Last night the pain got much worse so she scheduled this appointment. Also is having a lot of burping and acid reflux.     Home Meds:   Outpatient Prescriptions Prior to Visit  Medication Sig Dispense Refill  . aspirin EC 325 MG EC tablet Take 1 tablet (325 mg total) by mouth daily. 30 tablet 0  . atorvastatin (LIPITOR) 40 MG tablet Take 1 tablet (40 mg total) by mouth daily. 90 tablet 3  . furosemide (LASIX) 20 MG tablet Take 20 mg by mouth daily. Take one 20 mg tablet by mouth daily for one week then stop.    Marland Kitchen HYDROcodone-acetaminophen (NORCO/VICODIN) 5-325 MG per tablet Take 1 tablet by mouth every 4 (four) hours as needed for moderate pain.    Marland Kitchen levothyroxine (SYNTHROID, LEVOTHROID) 137 MCG tablet Take 1 tablet (137 mcg total) by mouth daily before breakfast. 30 tablet 5  . LORazepam (ATIVAN) 1 MG tablet Take 1 mg by mouth every 6 (six) hours as needed for anxiety.    . metoprolol tartrate (LOPRESSOR) 25 MG tablet Take 0.5 tablets (12.5 mg total) by mouth 2 (two) times daily. 90 tablet 3  . nitroGLYCERIN (NITROSTAT) 0.4 MG SL tablet Place 0.4 mg under the tongue every 5 (five) minutes as needed for chest pain.    . Nivolumab (OPDIVO IV) Inject into the vein every 14 (fourteen) days.     Marland Kitchen oxyCODONE (OXY IR/ROXICODONE) 5 MG immediate release tablet Take 1 tablet (5 mg total) by mouth every 3 (three) hours as needed for  severe pain. 30 tablet 0   No facility-administered medications prior to visit.    Allergies: No Known Allergies    Review of Systems: See HPI for pertinent ROS. All other ROS negative.    Physical Exam: Blood pressure 130/80, pulse 82, temperature 97.5 F (36.4 C), temperature source Oral, resp. rate 18, weight 177 lb (80.287 kg)., Body mass index is 26.92 kg/(m^2). General:  WF. Appears in no acute distress. Lungs: Clear bilaterally to auscultation without wheezes, rales, or rhonchi. Breathing is unlabored. Heart: Regular rhythm. No murmurs, rubs, or gallops. Msk:  Severe tenderness with palpation of left side of neck. Also severe tenderness with palpation of the left scapula area. She can abduct the left shoulder almost completely. Has full range of motion of left shoulder. Extremities/Skin: Warm and dry.  Neuro: Alert and oriented X 3. Moves all extremities spontaneously. Gait is normal. CNII-XII grossly in tact. Psych:  Responds to questions appropriately with a normal affect.     ASSESSMENT AND PLAN:  59 y.o. year old female with  1. Neck pain on left side Apply heat with warm water in the shower and heating pad. Range of motion/ stretches of the neck and shoulder throughout the day. - cyclobenzaprine (FLEXERIL) 10 MG tablet; Take 1 tablet (10 mg total) by mouth 3 (three)  times daily as needed for muscle spasms.  Dispense: 60 tablet; Refill: 0  2. Gastroesophageal reflux disease, esophagitis presence not specified - omeprazole (PRILOSEC) 20 MG capsule; Take 1 capsule (20 mg total) by mouth daily.  Dispense: 30 capsule; Refill: 3   Signed, [address] [place], [place], [facility] 03/18/2015 11:29 AM

## 2015-03-20 ENCOUNTER — Encounter (HOSPITAL_COMMUNITY): Payer: 59

## 2015-03-23 ENCOUNTER — Encounter (HOSPITAL_COMMUNITY): Payer: 59

## 2015-03-25 ENCOUNTER — Encounter (HOSPITAL_COMMUNITY): Payer: 59

## 2015-03-27 ENCOUNTER — Encounter (HOSPITAL_COMMUNITY): Payer: 59

## 2015-03-30 ENCOUNTER — Encounter (HOSPITAL_COMMUNITY): Payer: 59

## 2015-04-01 ENCOUNTER — Encounter (HOSPITAL_COMMUNITY): Payer: 59

## 2015-04-03 ENCOUNTER — Encounter (HOSPITAL_COMMUNITY): Payer: 59

## 2015-04-06 ENCOUNTER — Encounter (HOSPITAL_COMMUNITY): Payer: 59

## 2015-04-08 ENCOUNTER — Encounter: Payer: Self-pay | Admitting: Nurse Practitioner

## 2015-04-08 ENCOUNTER — Encounter (HOSPITAL_COMMUNITY): Payer: 59

## 2015-04-08 ENCOUNTER — Ambulatory Visit (INDEPENDENT_AMBULATORY_CARE_PROVIDER_SITE_OTHER): Payer: 59 | Admitting: Nurse Practitioner

## 2015-04-08 VITALS — BP 134/100 | HR 83 | Ht 68.5 in | Wt 172.1 lb

## 2015-04-08 DIAGNOSIS — E785 Hyperlipidemia, unspecified: Secondary | ICD-10-CM

## 2015-04-08 DIAGNOSIS — I480 Paroxysmal atrial fibrillation: Secondary | ICD-10-CM | POA: Diagnosis not present

## 2015-04-08 DIAGNOSIS — Z09 Encounter for follow-up examination after completed treatment for conditions other than malignant neoplasm: Secondary | ICD-10-CM

## 2015-04-08 DIAGNOSIS — I2510 Atherosclerotic heart disease of native coronary artery without angina pectoris: Secondary | ICD-10-CM

## 2015-04-08 NOTE — Progress Notes (Signed)
CARDIOLOGY OFFICE NOTE  Date:  04/08/2015    Sheri Hernandez Date of Birth: 1956-03-28 Medical Record #[id number]  PCP:  Odette Fraction, MD  Cardiologist:  Burt Knack    Chief Complaint  Patient presents with  . Coronary Artery Disease    2 month check - seen for Dr. Burt Knack    History of Present Illness: Sheri Hernandez is a 59 y.o. female who presents today for an approximate 8 week check. Seen for Dr. Burt Knack. She has a history of metastatic renal cell carcinoma. She is followed at [facility]. Other issues include asthma and sarcoid.  She presented in May 2016 with acute coronary syndrome. She was found to have non-ST elevation infarction and classic symptoms of crescendo angina. Cardiac catheterization demonstrated a critical RCA stenosis. An attempted PCI was made but there was a large dissection and subsequent acute inferior STEMI. The patient was taken emergently for CABG by Dr. Servando Snare. She underwent saphenous vein bypass to the right PDA and PLA branches.  Last seen by Dr. Burt Knack in June and other than some leg swelling she was doing ok. Had seen her oncologist back in June at [place] as well.  Comes back today. Here with her step son. She was brought in in a wheelchair - says it is "out of convenience" but wanting to "lose weight". Not going to cardiac rehab because she was told "it could cause a heart attack". Then tells me she has joined the [employer] - but has not gone yet. Back to [facility] tomorrow. BP better at home. Was out of town over the weekend - got too much salt. Some swelling. No longer wearing the support stockings. Can see the relationship from salt and swelling. She was in the pool this past weekend - now with a pain in the left ear. Continues with depression/anxiety. Has had some twinges of chest pain - fleeting and with movement. Has had some difficulty with movement of the left arm since her surgery - this seems to be improving.    Past Medical History    Diagnosis Date  . Papilloma of breast 2012    left. lumpectomy  . Anxiety   . Complication of anesthesia     hard time waking up 20 yrs. ago when having wisdon teeth removed  . Hypertension   . Shortness of breath     WITH EXERTION  . 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  . Lung mass     LUNG MASSES--COUGH-BIOPSY NON-DIAGNOSTIC.  HX OF MEDIASTINAL MASS AND LUNG MASSES AGE 59 -NEGATIVE BIOSPY-BUT GIVEN DIAGNOSIS OF SARCOIDOSIS.  WORK UP CONTINUING ON DIAGNOSIS FOR PT'S LUNG MASSES--SHE HAS BIOPSY PROVEN KIDNEY CANCER  . S/p nephrectomy     left, due to metastatic RCC, 3/14  . Cancer   . Asthma     Past Surgical History  Procedure Laterality Date  . Tonsillectomy  1967  . Bronchial brush biopsy    . Wids    . Wisdom tooth extraction    . Neck lesion biopsy    . Breast biopsy  08/18/2011    Procedure: BREAST BIOPSY WITH NEEDLE LOCALIZATION;  Surgeon: Merrie Roof, MD;  Location: [place];  Service: General;  Laterality: Left;  left  breast biopsy with needle localization  . Video bronchoscopy  10/11/2012    Procedure: VIDEO BRONCHOSCOPY WITH FLUORO;  Surgeon: Kathee Delton, MD;  Location: [facility];  Service: Cardiopulmonary;  Laterality: Bilateral;  . Laparoscopic nephrectomy Left 11/22/2012    Procedure: LAPAROSCOPIC NEPHRECTOMY;  Surgeon: Dutch Gray, MD;  Location: [facility];  Service: Urology;  Laterality: Left;  . Left heart catheterization with coronary angiogram N/A 01/02/2015    Procedure: LEFT HEART CATHETERIZATION WITH CORONARY ANGIOGRAM;  Surgeon: Leonie Man, MD;  Location: [facility] CATH LAB;  Service: Cardiovascular;  Laterality: N/A;  . Coronary artery bypass graft N/A 01/02/2015    Procedure: CORONARY ARTERY BYPASS GRAFTING (CABG)TIMES TWO USING RIGHT GREATER SAPHENOUS  LEG VEIN HARVESTED ENDOSCOPICALLY;  Surgeon: [name], MD;  Location: [place];  Service: Open Heart Surgery;  Laterality: N/A;     Medications: Current Outpatient Prescriptions  Medication Sig Dispense Refill  . acetaminophen (TYLENOL) 325 MG tablet Take 650 mg by mouth every 6 (six) hours as needed for mild pain.    Marland Kitchen aspirin EC 325 MG EC tablet Take 1 tablet (325 mg total) by mouth daily. 30 tablet 0  . atorvastatin (LIPITOR) 80 MG tablet Take 80 mg by mouth daily.    Marland Kitchen HYDROcodone-acetaminophen (NORCO/VICODIN) 5-325 MG per tablet Take 1 tablet by mouth every 4 (four) hours as needed for moderate pain.    Marland Kitchen levothyroxine (SYNTHROID, LEVOTHROID) 137 MCG tablet Take 1 tablet (137 mcg total) by mouth daily before breakfast. 30 tablet 5  . LORazepam (ATIVAN) 1 MG tablet Take 1 mg by mouth every 6 (six) hours as needed for anxiety.    . metoprolol tartrate (LOPRESSOR) 25 MG tablet Take 0.5 tablets (12.5 mg total) by mouth 2 (two) times daily. 90 tablet 3  . oxyCODONE (OXY IR/ROXICODONE) 5 MG immediate release tablet Take 1 tablet (5 mg total) by mouth every 3 (three) hours as needed for severe pain. 30 tablet 0   No current facility-administered medications for this visit.    Allergies: No Known Allergies  Social History: The patient  reports that she quit smoking about 2 years ago. Her smoking use included Cigarettes. She has a 60 pack-year smoking history. She has never used smokeless tobacco. She reports that she does not drink alcohol or use illicit drugs.   Family History: The patient's family history includes Breast cancer in her maternal aunt; Cancer in her maternal grandfather; Colon cancer in her maternal aunt; Heart attack in her father; Heart disease in her brother, father, maternal grandmother, and mother; Skin cancer in her maternal uncle; Stomach cancer in her maternal uncle; Stroke in her brother.   Review of Systems: Please see the history of present illness.    Otherwise, the review of systems is positive for none.   All other systems are reviewed and negative.   Physical Exam: VS:  BP 134/100 mmHg  Pulse 83  Ht 5' 8.5" (1.74 m)  Wt 172 lb 1.9 oz (78.073 kg)  BMI 25.79 kg/m2  SpO2 99% .  BMI Body mass index is 25.79 kg/(m^2).  Wt Readings from Last 3 Encounters:  04/08/15 172 lb 1.9 oz (78.073 kg)  03/18/15 177 lb (80.287 kg)  02/16/15 175 lb (79.379 kg)   BP is 140/100 in the right and 160/100 in the left.   General: Alert. A little argumentative at first but in no acute distress.  HEENT: Normal. Neck: Supple, no JVD, carotid bruits, or masses noted.  Cardiac: Regular rate and  rhythm. No murmurs, rubs, or gallops. No edema.  Respiratory:  Lungs are clear to auscultation bilaterally with normal work of breathing.  GI: Soft and nontender.  MS: No deformity or atrophy. Gait and ROM intact. Skin: Warm and dry. Color is normal.  Neuro:  Strength and sensation are intact and no gross focal deficits noted.  Psych: Alert, appropriate and with normal affect.   LABORATORY DATA:  EKG:  EKG is not ordered today.   Lab Results  Component Value Date   WBC 8.1 01/06/2015   HGB 8.0* 01/06/2015   HCT 25.1* 01/06/2015   PLT 176 01/06/2015   GLUCOSE 103* 01/06/2015   CHOL 90 01/03/2015   TRIG 77 01/03/2015   HDL 15* 01/03/2015   LDLCALC 60 01/03/2015   ALT 23 01/03/2015   AST 59* 01/03/2015   NA 135 01/06/2015   K 3.8 01/06/2015   CL 100* 01/06/2015   CREATININE 1.05* 01/06/2015   BUN 21* 01/06/2015   CO2 24 01/06/2015   TSH 0.262* 01/03/2015   INR 1.62* 01/03/2015   HGBA1C 6.1* 01/03/2015    BNP (last 3 results) No results for input(s): BNP in the last 8760 hours.  ProBNP (last 3 results) No results for input(s): PROBNP in the last 8760 hours.   Other Studies Reviewed Today:  Coronary angiography: Coronary dominance: Right   Left Main: Normal  Left Anterior Descending (LAD): Moderately calcified and normal in size.  There is diffuse 20% disease proximally. There is diffuse 40% disease in the midsegment.  1st diagonal (D1): Small in size with minor irregularities.  2nd diagonal (D2): Large in size with 20% ostial stenosis.  3rd diagonal (D3): Very small in size.  Circumflex (LCx): Normal in size and nondominant. The vessel is moderately calcified. There is a 95% proximal stenosis and 80% mid stenosis.  1st obtuse marginal: Small in size with minor irregularities.  2nd obtuse marginal: Medium in size with minor irregularities.  3rd obtuse marginal: Medium in size with minor irregularities.   Right Coronary Artery: Large in size and dominant. The vessel is heavily calcified in the midsegment. It is aneurysmal in the mid and distal segment. There is 95% proximal stenosis followed by diffuse 80% mid stenosis. There is an 80% stenosis distally between 2 aneurysmal segments.  Posterior descending artery: Normal in size with 95% proximal stenosis.  Posterior AV segment: Normal  Posterolateral branchs: PL 1 is small in size. PL 2 is very large in size with diffuse 50% proximal stenosis.  Left ventriculography: Left ventricular systolic function is (normal , LVEF is estimated at 55 %, there is mild mitral regurgitation   TEE Study Conclusions from 12/2014  - Left ventricle: Systolic function was mildly reduced. The estimated ejection fraction was in the range of 45% to 50%. Hypokinesis of the inferoseptal myocardium. Hypokinesis of the inferior myocardium. - Mitral valve: There was mild regurgitation originating from the central commissure and directed centrally. - Staged echo: Limited post CPB exam: Improved LV function, EF 50-55%. There is only mild inferior wall hypokinesis. No change post bypass in aortic valve function. No significant change in mitral valve function post bypass. The MR is only trivial.  Impressions:  - LV function improvement from pre-bypass  images. No other significant change from pre-bypass images.  EMERGENT CORONARY ARTERY BYPASS GRAFTING x 2 -SEQUENTIAL SVG to PDA, PLVB by Dr. Servando Snare on 01/02/2015.    Assessment/Plan:  1. Inferior MI with failed attempt at PCI - s/p emergent CABG x 2 - stable  progress. She is not interested in cardiac rehab and has joined the [employer] - just needs to start going.   2. Metastatic renal cell carcinoma - followed by [facility] - to be seen there tomorrow.  3. Depression  4. PAF - post op - no longer on amiodarone. In sinus by exam.   5. HTN - BP up here - she is adamant that she has good control at home. She is to continue to monitor.   6. HLD - remains on statin therapy.   7. Lower extremity edema, suspect related to vein harvesting. Understands the need to restrict salt. Would still recommend support stockings.   Current medicines are reviewed with the patient today.  The patient does not have concerns regarding medicines other than what has been noted above.  The following changes have been made:  See above.  Labs/ tests ordered today include:   No orders of the defined types were placed in this encounter.     Disposition:   FU with Dr. Burt Knack in 4  months.   Patient is agreeable to this plan and will call if any problems develop in the interim.   Signed: Burtis Junes, RN, ANP-C 04/08/2015 11:42 AM  [facility] [address] [place], [place]  [address] Phone: [phone] Fax: [phone]

## 2015-04-08 NOTE — Patient Instructions (Addendum)
We will be checking the following labs today - NONE  Medication Instructions:    Continue with your current medicines.     Testing/Procedures To Be Arranged:  N/A  Follow-Up:   See Dr. Burt Knack in 4 months    Other Special Instructions:   Think about what we talked about today  Call the [place] office at [phone] if you have any questions, problems or concerns.

## 2015-04-10 ENCOUNTER — Encounter: Payer: Self-pay | Admitting: Family Medicine

## 2015-04-10 ENCOUNTER — Encounter (HOSPITAL_COMMUNITY): Payer: 59

## 2015-04-10 ENCOUNTER — Ambulatory Visit (INDEPENDENT_AMBULATORY_CARE_PROVIDER_SITE_OTHER): Payer: 59 | Admitting: Family Medicine

## 2015-04-10 VITALS — BP 122/80 | HR 76 | Temp 98.0°F | Resp 14 | Ht 68.5 in | Wt 175.0 lb

## 2015-04-10 DIAGNOSIS — H6092 Unspecified otitis externa, left ear: Secondary | ICD-10-CM | POA: Diagnosis not present

## 2015-04-10 MED ORDER — CIPROFLOXACIN-DEXAMETHASONE 0.3-0.1 % OT SUSP: 4.0000 [drp] | Freq: Two times a day (BID) | OTIC | Status: DC

## 2015-04-10 NOTE — Patient Instructions (Signed)
Use drops twice a day for 1 week  Use tripe antibiotic ointment  F/U as needed

## 2015-04-10 NOTE — Progress Notes (Signed)
Patient ID: Sheri Hernandez, female   DOB: March 10, 1956, 59 y.o.   MRN: [id number]   Subjective:    Patient ID: Sheri Hernandez, female    DOB: 03/23/1956, 59 y.o.   MRN: [id number]  Patient presents for L Ear Pain  patient here with left ear pain. She was swimming last week and got water in her ear she felt some bubbling noticed 2 drops of a pink discharge. She also feels like her hearing is muffled. She's not had a fever no sinus drainage or sore throat.    Review Of Systems: per above   GEN- denies fatigue, fever, weight loss,weakness, recent illness HEENT- denies eye drainage, change in vision, nasal discharge, CVS- denies chest pain, palpitations RESP- denies SOB, cough, wheeze Neuro- denies headache, dizziness, syncope, seizure activity       Objective:    BP 122/80 mmHg  Pulse 76  Temp(Src) 98 F (36.7 C) (Oral)  Resp 14  Ht 5' 8.5" (1.74 m)  Wt 175 lb (79.379 kg)  BMI 26.22 kg/m2 GEN- NAD, alert and oriented x3 HEENT- PERRL, EOMI, non injected sclera, pink conjunctiva, MMM, oropharynx clear, poor dentition, Right TM partially obscurred by wax, Left TM clear no effusion obscurred by pus and swelling of canal  No maxillary sinus tenderness, nares clear Neck- Supple, no LAD         Assessment & Plan:      Problem List Items Addressed This Visit    None    Visit Diagnoses    Otitis externa, left    -  Primary    Ciprodex given, she has some cerumen in ear but does not tolerate [place] cleaning out ears in the office, will use OTC ear wash for righ ear only, also advised no swimming until clear       Note: This dictation was prepared with Dragon dictation along with smaller phrase technology. Any transcriptional errors that result from this process are unintentional.

## 2015-04-13 ENCOUNTER — Encounter (HOSPITAL_COMMUNITY): Payer: 59

## 2015-04-13 ENCOUNTER — Other Ambulatory Visit: Payer: Self-pay | Admitting: *Deleted

## 2015-04-13 ENCOUNTER — Telehealth: Payer: Self-pay | Admitting: *Deleted

## 2015-04-13 ENCOUNTER — Telehealth: Payer: Self-pay

## 2015-04-13 ENCOUNTER — Telehealth: Payer: Self-pay | Admitting: Family Medicine

## 2015-04-13 MED ORDER — NEOMYCIN-POLYMYXIN-HC 3.5-10000-1 OT SOLN: 3.0000 [drp] | Freq: Four times a day (QID) | OTIC | Status: DC

## 2015-04-13 MED ORDER — ATORVASTATIN CALCIUM 80 MG PO TABS: 80.0000 mg | ORAL_TABLET | Freq: Every day | ORAL | Status: DC

## 2015-04-13 NOTE — Telephone Encounter (Signed)
To MD

## 2015-04-13 NOTE — Telephone Encounter (Signed)
ciprofloxacin-dexamethasone (CIPRODEX) otic suspension 7.5 mL  That was prescribed for her last week are too expensive would like to know if something else can be prescribed

## 2015-04-13 NOTE — Telephone Encounter (Signed)
This is ok to refill

## 2015-04-13 NOTE — Telephone Encounter (Signed)
Cortisporin sent in

## 2015-04-13 NOTE — Telephone Encounter (Signed)
Received a call from patient's Cardiology Surgeon's office stating that patient needs Atorvastatin refilled.  They were asked to refill, however they are her surgeon not a regular provider.  They asked if we will okay the refill.  Please advise.

## 2015-04-13 NOTE — Telephone Encounter (Signed)
Patient requests refill of atorvastatin '40mg'$ , which was prescribed to he by Dr Servando Snare. Her chart has '80mg'$ , but she stated that the dose was reduced to '40mg'$ . Ok to fill? Please advise. Thanks, MI

## 2015-04-13 NOTE — Telephone Encounter (Signed)
Call placed to patient and patient made aware.  

## 2015-04-15 ENCOUNTER — Encounter (HOSPITAL_COMMUNITY): Payer: 59

## 2015-04-15 NOTE — Telephone Encounter (Signed)
Rx already sent in

## 2015-04-17 ENCOUNTER — Encounter (HOSPITAL_COMMUNITY): Payer: 59

## 2015-04-20 ENCOUNTER — Encounter (HOSPITAL_COMMUNITY): Payer: 59

## 2015-04-22 ENCOUNTER — Encounter (HOSPITAL_COMMUNITY): Payer: 59

## 2015-04-22 NOTE — Telephone Encounter (Signed)
I spoke with the pt and wanted to clarify that she is taking her Atorvastatin correctly.  The pt said she was given Atorvastatin '40mg'$  once a day.  I made her aware that she should continue this dosage.

## 2015-04-24 ENCOUNTER — Encounter (HOSPITAL_COMMUNITY): Payer: 59

## 2015-04-27 ENCOUNTER — Encounter (HOSPITAL_COMMUNITY): Payer: 59

## 2015-04-29 ENCOUNTER — Encounter (HOSPITAL_COMMUNITY): Payer: 59

## 2015-05-01 ENCOUNTER — Encounter (HOSPITAL_COMMUNITY): Payer: 59

## 2015-05-04 ENCOUNTER — Encounter (HOSPITAL_COMMUNITY): Payer: 59

## 2015-05-06 ENCOUNTER — Encounter (HOSPITAL_COMMUNITY): Payer: 59

## 2015-05-08 ENCOUNTER — Encounter (HOSPITAL_COMMUNITY): Payer: 59

## 2015-05-13 ENCOUNTER — Encounter (HOSPITAL_COMMUNITY): Payer: 59

## 2015-05-15 ENCOUNTER — Encounter (HOSPITAL_COMMUNITY): Payer: 59

## 2015-05-18 ENCOUNTER — Encounter (HOSPITAL_COMMUNITY): Payer: 59

## 2015-05-20 ENCOUNTER — Encounter (HOSPITAL_COMMUNITY): Payer: 59

## 2015-05-22 ENCOUNTER — Encounter (HOSPITAL_COMMUNITY): Payer: 59

## 2015-05-25 ENCOUNTER — Encounter (HOSPITAL_COMMUNITY): Payer: 59

## 2015-05-27 ENCOUNTER — Encounter (HOSPITAL_COMMUNITY): Payer: 59

## 2015-05-29 ENCOUNTER — Encounter (HOSPITAL_COMMUNITY): Payer: 59

## 2015-05-30 ENCOUNTER — Other Ambulatory Visit: Payer: Self-pay | Admitting: Family Medicine

## 2015-06-01 ENCOUNTER — Encounter (HOSPITAL_COMMUNITY): Payer: 59

## 2015-06-03 ENCOUNTER — Encounter (HOSPITAL_COMMUNITY): Payer: 59

## 2015-06-05 ENCOUNTER — Encounter (HOSPITAL_COMMUNITY): Payer: 59

## 2015-06-08 ENCOUNTER — Encounter (HOSPITAL_COMMUNITY): Payer: 59

## 2015-06-10 ENCOUNTER — Encounter (HOSPITAL_COMMUNITY): Payer: 59

## 2015-06-12 ENCOUNTER — Encounter (HOSPITAL_COMMUNITY): Payer: 59

## 2015-06-15 ENCOUNTER — Encounter (HOSPITAL_COMMUNITY): Payer: 59

## 2015-07-29 ENCOUNTER — Other Ambulatory Visit: Payer: Self-pay | Admitting: Hematology and Oncology

## 2015-07-29 DIAGNOSIS — C642 Malignant neoplasm of left kidney, except renal pelvis: Secondary | ICD-10-CM

## 2015-08-14 ENCOUNTER — Other Ambulatory Visit: Payer: Self-pay | Admitting: Hematology and Oncology

## 2015-08-14 ENCOUNTER — Ambulatory Visit
Admission: RE | Admit: 2015-08-14 | Discharge: 2015-08-14 | Disposition: A | Discharge status: Home / Self Care | Payer: 59 | Source: Ambulatory Visit | Attending: Hematology and Oncology | Admitting: Hematology and Oncology

## 2015-08-14 DIAGNOSIS — C642 Malignant neoplasm of left kidney, except renal pelvis: Secondary | ICD-10-CM

## 2015-08-21 ENCOUNTER — Ambulatory Visit (INDEPENDENT_AMBULATORY_CARE_PROVIDER_SITE_OTHER): Payer: 59 | Admitting: Cardiovascular Disease

## 2015-08-21 ENCOUNTER — Encounter: Payer: Self-pay | Admitting: Cardiovascular Disease

## 2015-08-21 VITALS — BP 144/84 | HR 90 | Ht 68.0 in | Wt 181.2 lb

## 2015-08-21 DIAGNOSIS — Z951 Presence of aortocoronary bypass graft: Secondary | ICD-10-CM

## 2015-08-21 NOTE — Patient Instructions (Signed)
Medication Instructions:  Your physician recommends that you continue on your current medications as directed. Please refer to the Current Medication list given to you today.  Labwork: Your physician recommends that you have a FASTING LIPID and LIVER checked in 3 MONTHS--order given to the patient to have this drawn and then faxed to our office at [phone]  Testing/Procedures: No new orders.   Follow-Up: Your physician recommends that you schedule a follow-up appointment in: 3 MONTHS with Truitt Merle NP  Your physician wants you to follow-up in: 6 MONTHS with Dr Burt Knack.  You will receive a reminder letter in the mail two months in advance. If you don't receive a letter, please call our office to schedule the follow-up appointment.  Any Other Special Instructions Will Be Listed Below (If Applicable).     If you need a refill on your cardiac medications before your next appointment, please call your pharmacy.

## 2015-08-21 NOTE — Progress Notes (Signed)
Cardiology Office Note Date:  08/21/2015   ID:  Sheri Hernandez, DOB May 10, 1956, MRN [id number]  PCP:  Sheri Fraction, MD  Cardiologist:  Sherren Mocha, MD    Chief Complaint  Patient presents with  . Coronary Artery Disease    History of Present Illness: Sheri Hernandez is a 59 y.o. female who presents for  Follow-up evaluation. The patient has a history of metastatic renal cell carcinoma. She presented in May 2016 with acute coronary syndrome and was found to have critical stenosis of a large, dominant right coronary artery. PCI was attempted but there was a large dissection and inferior wall STEMI. She was taken emergently for coronary bypass surgery by Dr. Servando Snare and underwent grafting with a saphenous vein bypass to the right PDA and PLA. She had a relatively uneventful postoperative course and has been seen back in follow-up without any further cardiac problems.   the patient has been doing okay from a cardiac perspective. She denies exertional chest pain or pressure. She does feel a pressure in her chest when she lies back at night. States this is slowly improving. She thinks it's related to her sternotomy. She denies heart palpitations, leg swelling, or PND. She had a port placed this week for ongoing chemotherapy. She complains of some bruising in the right chest and breast since this was done. She's been examined by the interventional radiologist and told everything is okay.  Past Medical History  Diagnosis Date  . Papilloma of breast 2012    left. lumpectomy  . Anxiety   . Complication of anesthesia     hard time waking up 20 yrs. ago when having wisdon teeth removed  . Hypertension   . Shortness of breath     WITH EXERTION  . Pain     HX OF EPISODES OF BREIF PAIN BACK OF HEAD-RIGHT SIDE- OCCURS WHEN PT TURNS HER HEAD TO RIGHT--BUT DOESN'T HAPPEN EVERY TIME SHE TURNS HER HEAD TO RIGHT--SHE HAS HAD ALL HER LIFE AND STATES PAST WORK UPS- NEGATIVE FOR ANY BRAIN  ISSUES.  . Lung mass     LUNG MASSES--COUGH-BIOPSY NON-DIAGNOSTIC.  HX OF MEDIASTINAL MASS AND LUNG MASSES AGE 69 -NEGATIVE BIOSPY-BUT GIVEN DIAGNOSIS OF SARCOIDOSIS.  WORK UP CONTINUING ON DIAGNOSIS FOR PT'S LUNG MASSES--SHE HAS BIOPSY PROVEN KIDNEY CANCER  . S/p nephrectomy     left, due to metastatic RCC, 3/14  . Cancer ([place])   . Asthma     Past Surgical History  Procedure Laterality Date  . Tonsillectomy  1967  . Bronchial brush biopsy    . Wids    . Wisdom tooth extraction    . Neck lesion biopsy    . Breast biopsy  08/18/2011    Procedure: BREAST BIOPSY WITH NEEDLE LOCALIZATION;  Surgeon: Merrie Roof, MD;  Location: [place];  Service: General;  Laterality: Left;  left breast biopsy with needle localization  . Video bronchoscopy  10/11/2012    Procedure: VIDEO BRONCHOSCOPY WITH FLUORO;  Surgeon: Kathee Delton, MD;  Location: [facility];  Service: Cardiopulmonary;  Laterality: Bilateral;  . Laparoscopic nephrectomy Left 11/22/2012    Procedure: LAPAROSCOPIC NEPHRECTOMY;  Surgeon: Dutch Gray, MD;  Location: [facility];  Service: Urology;  Laterality: Left;  . Left heart catheterization with coronary angiogram N/A 01/02/2015    Procedure: LEFT HEART CATHETERIZATION WITH CORONARY ANGIOGRAM;  Surgeon: Leonie Man, MD;  [facility]: [facility];  Service: Cardiovascular;  Laterality: N/A;  . Coronary artery bypass graft N/A  01/02/2015    Procedure: CORONARY ARTERY BYPASS GRAFTING (CABG)TIMES TWO USING RIGHT GREATER SAPHENOUS LEG VEIN HARVESTED ENDOSCOPICALLY;  Surgeon: [name], MD;  Location: [place];  Service: Open Heart Surgery;  Laterality: N/A;    Current Outpatient Prescriptions  Medication Sig Dispense Refill  . aspirin EC 325 MG EC tablet Take 1 tablet (325 mg total) by mouth daily. 30 tablet 0  . atorvastatin (LIPITOR) 80 MG tablet Take 1 tablet (80 mg total) by mouth daily. 30 tablet 9  . HYDROcodone-acetaminophen (NORCO/VICODIN) 5-325 MG per tablet Take 1 tablet by mouth  every 4 (four) hours as needed for moderate pain.    Marland Kitchen levothyroxine (SYNTHROID, LEVOTHROID) 137 MCG tablet TAKE 1 TABLET (137 MCG TOTAL) BY MOUTH DAILY BEFORE BREAKFAST. 30 tablet 11  . metoprolol tartrate (LOPRESSOR) 25 MG tablet Take 0.5 tablets (12.5 mg total) by mouth 2 (two) times daily. 90 tablet 3  . oxyCODONE (OXY IR/ROXICODONE) 5 MG immediate release tablet Take 1 tablet (5 mg total) by mouth every 3 (three) hours as needed for severe pain. 30 tablet 0   No current facility-administered medications for this visit.    Allergies:   Review of patient's allergies indicates no known allergies.   Social History:  The patient  reports that she quit smoking about 2 years ago. Her smoking use included Cigarettes. She has a 60 pack-year smoking history. She has never used smokeless tobacco. She reports that she does not drink alcohol or use illicit drugs.   Family History:  The patient's  family history includes Breast cancer in her maternal aunt; Cancer in her maternal grandfather; Colon cancer in her maternal aunt; Heart attack in her father; Heart disease in her brother, father, maternal grandmother, and mother; Skin cancer in her maternal uncle; Stomach cancer in her maternal uncle; Stroke in her brother.    ROS:  Please see the history of present illness.  Otherwise, review of systems is positive for  Muscle pain, left shoulder pain and limited range of motion, anxiety, easy bruising.  All other systems are reviewed and negative.    PHYSICAL EXAM: VS:  BP 144/84 mmHg  Pulse 90  Ht '5\' 8"'$  (1.727 m)  Wt 181 lb 3.2 oz (82.192 kg)  BMI 27.56 kg/m2 , BMI Body mass index is 27.56 kg/(m^2). GEN: Well nourished, well developed, in no acute distress HEENT: normal Neck: no JVD, no masses. No carotid bruits Cardiac: RRR without murmur or gallop  Chest: bruising right chest/breast               Respiratory:  clear to auscultation bilaterally, normal work of breathing GI: soft, nontender,  nondistended, + BS MS: no deformity or atrophy Ext: no pretibial edema, pedal pulses 2+= bilaterally Skin: warm and dry, no rash Neuro:  Strength and sensation are intact Psych: euthymic mood, full affect  EKG:  EKG is ordered today. The ekg ordered today shows  Normal sinus rhythm 90 bpm nonspecific ST abnormality, prolonged QT with QTC 474 ms  Recent Labs: 01/03/2015: ALT 23; TSH 0.262* 01/04/2015: Magnesium 2.0 01/06/2015: BUN 21*; Creatinine, Ser 1.05*; Hemoglobin 8.0*; Platelets 176; Potassium 3.8; Sodium 135   Lipid Panel     Component Value Date/Time   CHOL 90 01/03/2015 0210   TRIG 77 01/03/2015 0210   HDL 15* 01/03/2015 0210   CHOLHDL 6.0 01/03/2015 0210   VLDL 15 01/03/2015 0210   LDLCALC 60 01/03/2015 0210      Wt Readings from Last 3 Encounters:  08/21/15 181 lb 3.2 oz (82.192 kg)  04/10/15 175 lb (79.379 kg)  04/08/15 172 lb 1.9 oz (78.073 kg)     Cardiac Studies Reviewed: Coronary angiography: Coronary dominance: Right   Left Main: Normal  Left Anterior Descending (LAD): Moderately calcified and normal in size. There is diffuse 20% disease proximally. There is diffuse 40% disease in the midsegment.  1st diagonal (D1): Small in size with minor irregularities.  2nd diagonal (D2): Large in size with 20% ostial stenosis.  3rd diagonal (D3): Very small in size.  Circumflex (LCx): Normal in size and nondominant. The vessel is moderately calcified. There is a 95% proximal stenosis and 80% mid stenosis.  1st obtuse marginal: Small in size with minor irregularities.  2nd obtuse marginal: Medium in size with minor irregularities.  3rd obtuse marginal: Medium in size with minor irregularities.   Right Coronary Artery: Large in size and dominant. The vessel is heavily calcified in the midsegment. It is aneurysmal in the mid and distal segment. There is 95% proximal stenosis followed by diffuse 80% mid stenosis. There is an 80% stenosis distally  between 2 aneurysmal segments.  Posterior descending artery: Normal in size with 95% proximal stenosis.  Posterior AV segment: Normal  Posterolateral branchs: PL 1 is small in size. PL 2 is very large in size with diffuse 50% proximal stenosis.  Left ventriculography: Left ventricular systolic function is (normal , LVEF is estimated at 55 %, there is mild mitral regurgitation   TEE Study Conclusions from 12/2014  - Left ventricle: Systolic function was mildly reduced. The estimated ejection Hernandez was in the range of 45% to 50%. Hypokinesis of the inferoseptal myocardium. Hypokinesis of the inferior myocardium. - Mitral valve: There was mild regurgitation originating from the central commissure and directed centrally. - Staged echo: Limited post CPB exam: Improved LV function, EF 50-55%. There is only mild inferior wall hypokinesis. No change post bypass in aortic valve function. No significant change in mitral valve function post bypass. The MR is only trivial.  Impressions:  - LV function improvement from pre-bypass images. No other significant change from pre-bypass images.  EMERGENT CORONARY ARTERY BYPASS GRAFTING x 2 -SEQUENTIAL SVG to PDA, PLVB by Dr. Servando Snare on 01/02/2015.  ASSESSMENT AND PLAN: 1.   CAD, native vessel, without angina: Medications reviewed and continued. Today she will continue on aspirin, atorvastatin, and metoprolol.  2. Hyperlipidemia: continue high-dose atorvastatin, follow lipids and LFTs prior to return visit in 3 months.   3. Essential hypertension with pressure under reasonable control on metoprolol. There is a component of white-coat HTN as well.  Current medicines are reviewed with the patient today.  The patient does not have concerns regarding medicines.  Labs/ tests ordered today include:   Orders Placed This Encounter  Procedures  . EKG 12-Lead    Disposition:   FU 3 months with Truitt Merle, NP.    Deatra James, MD  08/21/2015 2:39 PM    [address], [place], [place]  [address] Phone: [phone]; Fax: [phone]

## 2015-11-24 ENCOUNTER — Ambulatory Visit (INDEPENDENT_AMBULATORY_CARE_PROVIDER_SITE_OTHER): Payer: 59 | Admitting: Nurse Practitioner

## 2015-11-24 ENCOUNTER — Encounter: Payer: Self-pay | Admitting: Nurse Practitioner

## 2015-11-24 VITALS — BP 160/96 | HR 68 | Ht 69.0 in | Wt 185.0 lb

## 2015-11-24 DIAGNOSIS — E785 Hyperlipidemia, unspecified: Secondary | ICD-10-CM

## 2015-11-24 DIAGNOSIS — I2510 Atherosclerotic heart disease of native coronary artery without angina pectoris: Secondary | ICD-10-CM | POA: Diagnosis not present

## 2015-11-24 DIAGNOSIS — I480 Paroxysmal atrial fibrillation: Secondary | ICD-10-CM

## 2015-11-24 NOTE — Progress Notes (Signed)
CARDIOLOGY OFFICE NOTE  Date:  11/24/2015    Sheri Hernandez Date of Birth: 1955/11/14 Medical Record #[id number]  PCP:  Odette Fraction, MD  Cardiologist:  Burt Knack    Chief Complaint  Patient presents with  . Coronary Artery Disease    3 month check - seen for Dr. Burt Knack    History of Present Illness: Sheri Hernandez is a 60 y.o. female who presents today for a 3 month check. Seen for Dr. Burt Knack. She has a history of metastatic renal cell carcinoma. She is followed at [facility]. Other issues include asthma and sarcoid.  She presented in May 2016 with acute coronary syndrome. She was found to have non-ST elevation infarction and classic symptoms of crescendo angina. Cardiac catheterization demonstrated a critical RCA stenosis. An attempted PCI was made but there was a large dissection and subsequent acute inferior STEMI. The patient was taken emergently for CABG by Dr. Servando Snare. She underwent saphenous vein bypass to the right PDA and PLA branches.  I saw her back in August - she was felt to be stable - having more issues with her depression/anxiety related to her cancer.   Saw Dr. Burt Knack in December. Cardiac status felt to be stable.   Comes back today. Here alone today. Her brother died of presumed aortic dissection at age 60 3 weeks ago. She is pretty upset. She has had recent scan - her cancer seems stable. Lipids from [facility] look great. She remains anxious and depressed. She is basically paralyzed from fear and does not exercise due to "what might happen". Upset about her weight. Not dizzy or lightheaded.    Past Medical History  Diagnosis Date  . Papilloma of breast 2012    left. lumpectomy  . Anxiety   . Complication of anesthesia     hard time waking up 20 yrs. ago when having wisdon teeth removed  . Hypertension   . Shortness of breath     WITH EXERTION  . 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  . Lung mass     LUNG MASSES--COUGH-BIOPSY NON-DIAGNOSTIC.  HX OF MEDIASTINAL MASS AND LUNG MASSES AGE 30 -NEGATIVE BIOSPY-BUT GIVEN DIAGNOSIS OF SARCOIDOSIS.  WORK UP CONTINUING ON DIAGNOSIS FOR PT'S LUNG MASSES--SHE HAS BIOPSY PROVEN KIDNEY CANCER  . S/p nephrectomy     left, due to metastatic RCC, 3/14  . Cancer ([place])   . Asthma     Past Surgical History  Procedure Laterality Date  . Tonsillectomy  1967  . Bronchial brush biopsy    . Wids    . Wisdom tooth extraction    . Neck lesion biopsy    . Breast biopsy  08/18/2011    Procedure: BREAST BIOPSY WITH NEEDLE LOCALIZATION;  Surgeon: Merrie Roof, MD;  Location: [place];  Service: General;  Laterality: Left;  left breast biopsy with needle localization  . Video bronchoscopy  10/11/2012    Procedure: VIDEO BRONCHOSCOPY WITH FLUORO;  Surgeon: Kathee Delton, MD;  Location: [facility];  Service: Cardiopulmonary;  Laterality: Bilateral;  . Laparoscopic nephrectomy Left 11/22/2012    Procedure: LAPAROSCOPIC NEPHRECTOMY;  Surgeon: Dutch Gray, MD;  Location: [facility];  Service: Urology;  Laterality: Left;  . Left heart catheterization with coronary  angiogram N/A 01/02/2015    Procedure: LEFT HEART CATHETERIZATION WITH CORONARY ANGIOGRAM;  Surgeon: Leonie Man, MD;  Location: [facility] CATH LAB;  Service: Cardiovascular;  Laterality: N/A;  . Coronary artery bypass graft N/A 01/02/2015    Procedure: CORONARY ARTERY BYPASS GRAFTING (CABG)TIMES TWO USING RIGHT GREATER SAPHENOUS LEG VEIN HARVESTED ENDOSCOPICALLY;  Surgeon: [name], MD;  Location: [place];  Service: Open Heart Surgery;  Laterality: N/A;     Medications: Current Outpatient Prescriptions  Medication Sig Dispense Refill  . aspirin EC 325 MG EC tablet Take 1 tablet (325 mg total) by mouth daily. 30 tablet 0  . atorvastatin (LIPITOR)  80 MG tablet Take 1 tablet (80 mg total) by mouth daily. 30 tablet 9  . HYDROcodone-acetaminophen (NORCO/VICODIN) 5-325 MG per tablet Take 1 tablet by mouth every 4 (four) hours as needed for moderate pain.    Marland Kitchen levothyroxine (SYNTHROID, LEVOTHROID) 137 MCG tablet TAKE 1 TABLET (137 MCG TOTAL) BY MOUTH DAILY BEFORE BREAKFAST. 30 tablet 11  . metoprolol tartrate (LOPRESSOR) 25 MG tablet Take 0.5 tablets (12.5 mg total) by mouth 2 (two) times daily. 90 tablet 3  . oxyCODONE (OXY IR/ROXICODONE) 5 MG immediate release tablet Take 1 tablet (5 mg total) by mouth every 3 (three) hours as needed for severe pain. 30 tablet 0   No current facility-administered medications for this visit.    Allergies: No Known Allergies  Social History: The patient  reports that she quit smoking about 3 years ago. Her smoking use included Cigarettes. She has a 60 pack-year smoking history. She has never used smokeless tobacco. She reports that she does not drink alcohol or use illicit drugs.   Family History: The patient's family history includes Breast cancer in her maternal aunt; Cancer in her maternal grandfather; Colon cancer in her maternal aunt; Heart attack in her father; Heart disease in her brother, father, maternal grandmother, and mother; Skin cancer in her maternal uncle; Stomach cancer in her maternal uncle; Stroke in her brother.   Review of Systems: Please see the history of present illness.   Otherwise, the review of systems is positive for none.   All other systems are reviewed and negative.   Physical Exam: VS:  BP 160/96 mmHg  Pulse 68  Ht '5\' 9"'$  (1.753 m)  Wt 185 lb (83.915 kg)  BMI 27.31 kg/m2 .  BMI Body mass index is 27.31 kg/(m^2).  Wt Readings from Last 3 Encounters:  11/24/15 185 lb (83.915 kg)  08/21/15 181 lb 3.2 oz (82.192 kg)  04/10/15 175 lb (79.379 kg)    General: Pleasant. Well developed, well nourished and in no acute distress.  HEENT: Normal. Neck: Supple, no JVD, carotid  bruits, or masses noted.  Cardiac: Regular rate and rhythm. No murmurs, rubs, or gallops. No edema.  Respiratory:  Lungs are clear to auscultation bilaterally with normal work of breathing.  GI: Soft and nontender.  MS: No deformity or atrophy. Gait and ROM intact. Skin: Warm and dry. Color is normal.  Neuro:  Strength and sensation are intact and no gross focal deficits noted.  Psych: Alert, appropriate and with normal affect.   LABORATORY DATA:  EKG:  EKG is ordered today. This shows NSR.   Lab Results  Component Value Date   WBC 8.1 01/06/2015   HGB 8.0* 01/06/2015   HCT 25.1* 01/06/2015   PLT 176 01/06/2015   GLUCOSE 103* 01/06/2015   CHOL 90 01/03/2015   TRIG 77 01/03/2015   HDL 15* 01/03/2015  LDLCALC 60 01/03/2015   ALT 23 01/03/2015   AST 59* 01/03/2015   NA 135 01/06/2015   K 3.8 01/06/2015   CL 100* 01/06/2015   CREATININE 1.05* 01/06/2015   BUN 21* 01/06/2015   CO2 24 01/06/2015   TSH 0.262* 01/03/2015   INR 1.62* 01/03/2015   HGBA1C 6.1* 01/03/2015    BNP (last 3 results) No results for input(s): BNP in the last 8760 hours.  ProBNP (last 3 results) No results for input(s): PROBNP in the last 8760 hours.  LDL Direct 62 <150 mg/dL Total Cholesterol 116 25 - 199 MG/DL Triglycerides 100 10 - 150 MG/DL HDL Cholesterol 33 (L) 35 - 135 MG/DL Total Chol / HDL Cholesterol 3.5 <4.5 Non-HDL Cholesterol 83 Comment: TARGET: <(LDL-C TARGET + 30)MG/DL MG/DL   Other Studies Reviewed Today:  Coronary angiography: Coronary dominance: Right   Left Main: Normal  Left Anterior Descending (LAD): Moderately calcified and normal in size. There is diffuse 20% disease proximally. There is diffuse 40% disease in the midsegment.  1st diagonal (D1): Small in size with minor irregularities.  2nd diagonal (D2): Large in size with 20% ostial stenosis.  3rd diagonal (D3): Very small in size.  Circumflex (LCx): Normal in size and nondominant. The vessel is moderately  calcified. There is a 95% proximal stenosis and 80% mid stenosis.  1st obtuse marginal: Small in size with minor irregularities.  2nd obtuse marginal: Medium in size with minor irregularities.  3rd obtuse marginal: Medium in size with minor irregularities.   Right Coronary Artery: Large in size and dominant. The vessel is heavily calcified in the midsegment. It is aneurysmal in the mid and distal segment. There is 95% proximal stenosis followed by diffuse 80% mid stenosis. There is an 80% stenosis distally between 2 aneurysmal segments.  Posterior descending artery: Normal in size with 95% proximal stenosis.  Posterior AV segment: Normal  Posterolateral branchs: PL 1 is small in size. PL 2 is very large in size with diffuse 50% proximal stenosis.  Left ventriculography: Left ventricular systolic function is (normal , LVEF is estimated at 55 %, there is mild mitral regurgitation   TEE Study Conclusions from 12/2014  - Left ventricle: Systolic function was mildly reduced. The estimated ejection fraction was in the range of 45% to 50%. Hypokinesis of the inferoseptal myocardium. Hypokinesis of the inferior myocardium. - Mitral valve: There was mild regurgitation originating from the central commissure and directed centrally. - Staged echo: Limited post CPB exam: Improved LV function, EF 50-55%. There is only mild inferior wall hypokinesis. No change post bypass in aortic valve function. No significant change in mitral valve function post bypass. The MR is only trivial.  Impressions:  - LV function improvement from pre-bypass images. No other significant change from pre-bypass images.  EMERGENT CORONARY ARTERY BYPASS GRAFTING x 2 -SEQUENTIAL SVG to PDA, PLVB by Dr. Servando Snare on 01/02/2015.  CT CHEST ABDOMEN PELVIS WO CONTRAST (ROUTINE), 11/19/2015 12:46 PM    INDICATION: C64.2 Renal cell cancer, left (HCC)   COMPARISON: 08/20/2015    TECHNIQUE:  Multislice axial images were obtained through the chest, abdomen, and pelvis without administration of iodinated intravenous contrast material. Multi-planar reformatted images were generated for additional analysis. Nongated technique limits cardiac detail.    [facility] and its affiliates are committed to minimizing radiation dose to patients while maintaining necessary diagnostic image quality. All CT scans are therefore performed using "As Low As Reasonably Achievable (ALARA)" protocols with either manual or automated exposure  controls calibrated to the age and size of each patient.    FINDINGS:     CHEST: Median sternotomy.  .Thoracic inlet/axilla: Within normal limits.  .Central airways: Within normal limits.  .Mediastinum/hila: Within normal limits.   .Heart/vessel: Normal heart size. No pericardial effusion. Aorta normal in caliber and appearance. Pulmonary artery 3.7 cm. Atherosclerosis coronary arteries.  .Lungs: Centrilobular emphysema. Poorly defined nodular densities left upper lobe 4 and 8 mm no significant change. 11 mm oval-shaped density left base no significant change. 166 series 4. 10 mm nodular density left base anterior no significant change. Image 181 series 4.  .Pleura: Within normal limits.     ABDOMEN:  .Liver: Millimeter low-density lesion segment 6 no change. Image 127 series 2.  .Gallbladder/biliary: Within normal limits.  .Spleen: Within normal limits.  .Pancreas: Within normal limits.  .Adrenals: Within normal limits.  .Kidneys: Left nephrectomy. Grossly normal right kidney.  .Peritoneum: Within normal limits.  .Mesentery: Within normal limits.  .Extraperitoneum: Left side nodular densities adjacent to the aorta and superior mesenteric artery. Largest measures 17 x 15 mm previous 20 x 15 mm. Image 128 series 2. Subjacent nodules no change. Lateral left upper abdomen 13 x 13 mm  nodular density previous 18 x 18 mm. Image 132 series 2. Nodule slight increase in density. No change.  .GI tract: Within normal limits.  .Vascular: Atherosclerosis abdominal aorta no aneurysm.    PELVIS:  .Peritoneum: Within normal limits.  .Extraperitoneum: Within normal limits.  .Ureters: Within normal limits.  .Bladder: Within normal limits.  .Reproductive system: Within normal limits.  .Vascular: Atherosclerosis iliac arteries.    MSK:  Midline ventral hernia containing transverse colon and greater omentum no change. No obstruction or incarceration. Millimeters sclerotic lesion iliac bone change. Image 181 series 2.   Assessment/Plan:  1. Inferior MI with failed attempt at PCI - s/p emergent CABG x 2 in 12/2014 - stable progress. Almost one year out from her surgery. Really needs to work on CV risk factors - discussed at length and she may be more receptive today.   2. Metastatic renal cell carcinoma - followed by [facility]  3. Depression  4. PAF - post op - no longer on amiodarone. In sinus by exam and EKG.   5. HTN - BP up here - she is adamant that she has good control at home. She is to continue to monitor. Has felt to have some degree of white coat syndrome  6. HLD - remains on statin therapy. Recent labs look good.  Current medicines are reviewed with the patient today.  The patient does not have concerns regarding medicines other than what has been noted above.  The following changes have been made:  See above.  Labs/ tests ordered today include:    Orders Placed This Encounter  Procedures  . EKG 12-Lead     Disposition:   FU with Dr. Burt Knack in 3 months.   Patient is agreeable to this plan and will call if any problems develop in the interim.   Signed: Burtis Junes, RN, ANP-C 11/24/2015 11:23 AM  [place] [address] [place], [place]  [address] Phone: [phone] Fax: [phone]

## 2015-11-24 NOTE — Patient Instructions (Addendum)
We will be checking the following labs today - NONE   Medication Instructions:    Continue with your current medicines.     Testing/Procedures To Be Arranged:  N/A  Follow-Up:   See Dr. Burt Knack in 3 months    Other Special Instructions:   Think about what we talked about today - regular exercise is important  Let us know if this chest discomfort is any worse/progressive/more frequent, etc.     If you need a refill on your cardiac medications before your next appointment, please call your pharmacy.   Call the [place] office at [phone] if you have any questions, problems or concerns.

## 2015-12-29 ENCOUNTER — Other Ambulatory Visit: Payer: Self-pay | Admitting: Family Medicine

## 2015-12-29 NOTE — Telephone Encounter (Signed)
Refill appropriate and filled per protocol. 

## 2015-12-30 DIAGNOSIS — Z736 Limitation of activities due to disability: Secondary | ICD-10-CM

## 2016-02-09 ENCOUNTER — Ambulatory Visit (INDEPENDENT_AMBULATORY_CARE_PROVIDER_SITE_OTHER): Payer: 59 | Admitting: Cardiovascular Disease

## 2016-02-09 ENCOUNTER — Encounter: Payer: Self-pay | Admitting: Cardiovascular Disease

## 2016-02-09 VITALS — BP 120/80 | HR 80 | Ht 68.0 in | Wt 190.6 lb

## 2016-02-09 DIAGNOSIS — R0602 Shortness of breath: Secondary | ICD-10-CM | POA: Diagnosis not present

## 2016-02-09 DIAGNOSIS — I25708 Atherosclerosis of coronary artery bypass graft(s), unspecified, with other forms of angina pectoris: Secondary | ICD-10-CM

## 2016-02-09 MED ORDER — ASPIRIN 81 MG PO TBEC: 81.0000 mg | DELAYED_RELEASE_TABLET | Freq: Every day | ORAL | Status: AC

## 2016-02-09 MED ORDER — NITROGLYCERIN 0.4 MG SL SUBL: 0.4000 mg | SUBLINGUAL_TABLET | SUBLINGUAL | Status: DC | PRN

## 2016-02-09 NOTE — Patient Instructions (Addendum)
Medication Instructions:  Your physician has recommended you make the following change in your medication:  1. DECREASE Aspirin to '81mg'$  take one tablet by mouth daily  Labwork: No new orders.   Testing/Procedures: Your physician has requested that you have an echocardiogram. Echocardiography is a painless test that uses sound waves to create images of your heart. It provides your doctor with information about the size and shape of your heart and how well your heart's chambers and valves are working. This procedure takes approximately one hour. There are no restrictions for this procedure.  Follow-Up: Your physician recommends that you schedule a follow-up appointment in: 6 WEEKS with Truitt Merle NP   Any Other Special Instructions Will Be Listed Below (If Applicable).  Dr Burt Knack recommends that you keep a diary of symptoms and bring this to your follow-up appointment.  Today we discussed either a trial of Isosorbide Mononitrate to treat angina or proceed with a pharmacologic stress test to evaluate for ischemia.      If you need a refill on your cardiac medications before your next appointment, please call your pharmacy.

## 2016-02-09 NOTE — Progress Notes (Signed)
Cardiology Office Note Date:  02/09/2016   ID:  Sheri Hernandez, DOB Oct 29, 1955, MRN [id number]  PCP:  Odette Fraction, MD  Cardiologist:  Sherren Mocha, MD    Chief Complaint  Patient presents with  . acute coronary syndrome    c/o chest pain,shortness of breath. Denies LEE or claudication     History of Present Illness: Sheri Hernandez is a 60 y.o. female who presents for follow-up evaluation. The patient is followed for coronary artery disease. She presented with non-ST elevation infarction one year ago and underwent complex PCI complicated by coronary dissection any acute inferior STEMI. She was taken emergently for CABG and underwent saphenous vein graft bypass to the right PDA and PLA branches.  The patient is here alone today. She reports brief episodes of a tightness and burning pain in neck. These have some characteristics of her previous angina, but they only last a few minutes. She has not required nitroglycerin. Symptoms are not brought on by physical exertion and they are somewhat unpredictable. They do not consistently occur after eating. She also complains of shortness of breath, sometimes with exertion and sometimes at rest. She has chronic orthopnea since she was a child and there is no change in this. She denies leg swelling or PND. She's had no heart palpitations.  Past Medical History  Diagnosis Date  . Papilloma of breast 2012    left. lumpectomy  . Anxiety   . Complication of anesthesia     hard time waking up 20 yrs. ago when having wisdon teeth removed  . Hypertension   . Shortness of breath     WITH EXERTION  . 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  . Lung mass     LUNG MASSES--COUGH-BIOPSY NON-DIAGNOSTIC.  HX OF MEDIASTINAL MASS AND LUNG MASSES AGE  11 -NEGATIVE BIOSPY-BUT GIVEN DIAGNOSIS OF SARCOIDOSIS.  WORK UP CONTINUING ON DIAGNOSIS FOR PT'S LUNG MASSES--SHE HAS BIOPSY PROVEN KIDNEY CANCER  . S/p nephrectomy     left, due to metastatic RCC, 3/14  . Cancer ([place])   . Asthma     Past Surgical History  Procedure Laterality Date  . Tonsillectomy  1967  . Bronchial brush biopsy    . Wids    . Wisdom tooth extraction    . Neck lesion biopsy    . Breast biopsy  08/18/2011    Procedure: BREAST BIOPSY WITH NEEDLE LOCALIZATION;  Surgeon: Merrie Roof, MD;  Location: [facility];  Service: General;  Laterality: Left;  left breast biopsy with needle localization  . Video bronchoscopy  10/11/2012    Procedure: VIDEO BRONCHOSCOPY WITH FLUORO;  Surgeon: Kathee Delton, MD;  Location: [facility];  Service: Cardiopulmonary;  Laterality: Bilateral;  . Laparoscopic nephrectomy Left 11/22/2012    Procedure: LAPAROSCOPIC NEPHRECTOMY;  Surgeon: Dutch Gray, MD;  Location: [facility];  Service: Urology;  Laterality: Left;  . Left heart catheterization with coronary angiogram N/A 01/02/2015    Procedure: LEFT HEART CATHETERIZATION WITH CORONARY ANGIOGRAM;  Surgeon: Leonie Man, MD;  Location: [facility];  Service: Cardiovascular;  Laterality: N/A;  . Coronary artery bypass graft N/A 01/02/2015    Procedure: CORONARY ARTERY BYPASS GRAFTING (CABG)TIMES TWO USING RIGHT GREATER SAPHENOUS LEG VEIN HARVESTED ENDOSCOPICALLY;  Surgeon: [name], MD;  Location: [facility];  Service: Open Heart Surgery;  Laterality: N/A;    Current Outpatient Prescriptions  Medication Sig Dispense Refill  . aspirin 81 MG EC tablet Take 1 tablet (81 mg total) by mouth daily.    Marland Kitchen atorvastatin (LIPITOR) 80 MG tablet Take 1 tablet (80 mg total) by mouth daily. 30 tablet 9  . HYDROcodone-acetaminophen (NORCO/VICODIN) 5-325 MG per tablet Take 1 tablet by mouth every 4 (four) hours as needed for moderate pain.    Marland Kitchen levothyroxine (SYNTHROID, LEVOTHROID) 137 MCG tablet TAKE 1 TABLET (137  MCG TOTAL) BY MOUTH DAILY BEFORE BREAKFAST. 30 tablet 11  . metoprolol tartrate (LOPRESSOR) 25 MG tablet Take 0.5 tablets (12.5 mg total) by mouth 2 (two) times daily. 90 tablet 3  . oxyCODONE (OXY IR/ROXICODONE) 5 MG immediate release tablet Take 1 tablet (5 mg total) by mouth every 3 (three) hours as needed for severe pain. 30 tablet 0  . nitroGLYCERIN (NITROSTAT) 0.4 MG SL tablet Place 1 tablet (0.4 mg total) under the tongue every 5 (five) minutes as needed for chest pain. 25 tablet 1   No current facility-administered medications for this visit.    Allergies:   Review of patient's allergies indicates no known allergies.   Social History:  The patient  reports that she quit smoking about 3 years ago. Her smoking use included Cigarettes. She has a 60 pack-year smoking history. She has never used smokeless tobacco. She reports that she does not drink alcohol or use illicit drugs.   Family History:  The patient's  family history includes Breast cancer in her maternal aunt; Cancer in her maternal grandfather; Colon cancer in her maternal aunt; Heart attack in her father; Heart disease in her brother, father, maternal grandmother, and mother; Skin cancer in her maternal uncle; Stomach cancer in her maternal uncle; Stroke in her brother.    ROS:  Please see the history of present illness.  Otherwise, review of systems is positive for nausea, anxiety, muscle pain, back pain, dizziness, cough.  All other systems are reviewed and negative.    PHYSICAL EXAM: VS:  BP 120/80 mmHg  Pulse 80  Ht '5\' 8"'$  (1.727 m)  Wt 190 lb 9.6 oz (86.456 kg)  BMI 28.99 kg/m2 , BMI Body mass index is 28.99 kg/(m^2). GEN: Well nourished, well developed, in no acute distress HEENT: normal Neck: no JVD, no masses. No carotid bruits Cardiac: RRR without murmur or gallop                Respiratory:  clear to auscultation bilaterally, normal work of breathing GI: soft, nontender, nondistended, + BS MS: no deformity or  atrophy Ext: trace bilateral pretibial edema, pedal pulses 2+= bilaterally, both legs are tender to palpation Skin: warm and dry, no rash Neuro:  Strength and sensation are intact Psych: euthymic mood, full affect  EKG:  EKG is ordered today. The ekg ordered today shows normal sinus rhythm 68 bpm, within normal limits.  Recent Labs: No results found for requested labs within last 365 days.   Lipid Panel     Component Value Date/Time   CHOL 90 01/03/2015 0210   TRIG 77 01/03/2015 0210   HDL 15* 01/03/2015 0210   CHOLHDL 6.0 01/03/2015 0210   VLDL 15 01/03/2015 0210   LDLCALC 60 01/03/2015 0210     Wt Readings from Last 3 Encounters:  02/09/16 190 lb 9.6 oz (86.456 kg)  11/24/15 185 lb (83.915 kg)  08/21/15 181  lb 3.2 oz (82.192 kg)     Cardiac Studies Reviewed: 2D Echo 01-02-2015: Study Conclusions  - Left ventricle: Systolic function was mildly reduced. The  estimated ejection fraction was in the range of 45% to 50%.  Hypokinesis of the inferoseptal myocardium. Hypokinesis of the  inferior myocardium. - Mitral valve: There was mild regurgitation originating from the  central commissure and directed centrally. - Staged echo: Limited post CPB exam: Improved LV function, EF  50-55%. There is only mild inferior wall hypokinesis. No change  post bypass in aortic valve function. No significant change in  mitral valve function post bypass. The MR is only trivial.  Impressions:  - LV function improvement from pre-bypass images. No other  significant change from pre-bypass images.  ASSESSMENT AND PLAN: 1.  CAD, native vessel, with angina: symptoms with typical and atypical features. She is s/p CABG just one year ago. I have recommended a pharmacologic stress test but she does not want to pursue this. She is very fearful of stress testing. Would like to discuss with Dr Marcello Moores and her husband. I also offered her a trial of low-dose isosorbide but she declined. Will  continue with observation. She will keep a diary of her symptoms. I asked her to FU with Truitt Merle, NP in 6 weeks.   2. Shortness of breath: unclear etiology. No obvious volume overload on exam. She has had mild LV systolic function last echo one year ago. Lung exam is clear. BP seems well-controlled. Will repeat an echocardiogram. Continue current Rx for now.   3. HTN: controlled  4. Hyperlipidemia: continue atorvastatin 80 mg. Most recent lipids reviewed. Due for repeat labs - will arrange. LFt's recently checked and reviewed through [facility].   Current medicines are reviewed with the patient today.  The patient does not have concerns regarding medicines.  Labs/ tests ordered today include:   Orders Placed This Encounter  Procedures  . EKG 12-Lead  . ECHOCARDIOGRAM COMPLETE    Disposition:   FU 6 weeks with Truitt Merle, NP  Signed, Sherren Mocha, MD  02/09/2016 1:47 PM    [facility] [place], [place],   [address] Phone: [phone]; Fax: [phone]

## 2016-02-26 ENCOUNTER — Other Ambulatory Visit: Payer: Self-pay

## 2016-02-26 ENCOUNTER — Ambulatory Visit (HOSPITAL_COMMUNITY): Payer: 59 | Attending: Cardiovascular Disease

## 2016-02-26 DIAGNOSIS — I25708 Atherosclerosis of coronary artery bypass graft(s), unspecified, with other forms of angina pectoris: Secondary | ICD-10-CM | POA: Insufficient documentation

## 2016-02-26 DIAGNOSIS — I10 Essential (primary) hypertension: Secondary | ICD-10-CM | POA: Diagnosis not present

## 2016-02-26 DIAGNOSIS — R0600 Dyspnea, unspecified: Secondary | ICD-10-CM | POA: Diagnosis present

## 2016-02-26 DIAGNOSIS — R0602 Shortness of breath: Secondary | ICD-10-CM

## 2016-02-26 LAB — ECHOCARDIOGRAM COMPLETE
Ao-asc: 35 cm
CHL CUP MV DEC (S): 204
EERAT: 6.13
EWDT: 204 ms
FS: 28 % (ref 28–44)
IV/PV OW: 0.91
LA ID, A-P, ES: 32 mm
LA diam end sys: 32 mm
LA diam index: 1.55 cm/m2
LA vol A4C: 24 ml
LA vol index: 18.5 mL/m2
LAVOL: 38 mL
LDCA: 2.84 cm2
LV PW d: 11 mm — AB (ref 0.6–1.1)
LV TDI E'LATERAL: 9.43
LV TDI E'MEDIAL: 6.25
LV e' LATERAL: 9.43 cm/s
LVEEAVG: 6.13
LVEEMED: 6.13
LVOT VTI: 16.5 cm
LVOT diameter: 19 mm
LVOT peak grad rest: 3 mmHg
LVOTPV: 89.3 cm/s
LVOTSV: 47 mL
MV pk A vel: 77.9 m/s
MVPKEVEL: 57.8 m/s
Reg peak vel: 238 cm/s
TR max vel: 238 cm/s

## 2016-03-08 ENCOUNTER — Other Ambulatory Visit: Payer: Self-pay | Admitting: Cardiovascular Disease

## 2016-03-22 ENCOUNTER — Encounter: Payer: Self-pay | Admitting: Nurse Practitioner

## 2016-03-22 ENCOUNTER — Ambulatory Visit (INDEPENDENT_AMBULATORY_CARE_PROVIDER_SITE_OTHER): Payer: 59 | Admitting: Nurse Practitioner

## 2016-03-22 VITALS — BP 140/100 | HR 80 | Ht 68.0 in | Wt 194.0 lb

## 2016-03-22 DIAGNOSIS — E785 Hyperlipidemia, unspecified: Secondary | ICD-10-CM | POA: Diagnosis not present

## 2016-03-22 DIAGNOSIS — I25708 Atherosclerosis of coronary artery bypass graft(s), unspecified, with other forms of angina pectoris: Secondary | ICD-10-CM

## 2016-03-22 DIAGNOSIS — Z951 Presence of aortocoronary bypass graft: Secondary | ICD-10-CM | POA: Diagnosis not present

## 2016-03-22 MED ORDER — METOPROLOL TARTRATE 25 MG PO TABS: ORAL_TABLET | ORAL | Status: DC

## 2016-03-22 MED ORDER — ISOSORBIDE MONONITRATE ER 30 MG PO TB24: 15.0000 mg | ORAL_TABLET | Freq: Every day | ORAL | Status: DC

## 2016-03-22 NOTE — Progress Notes (Signed)
CARDIOLOGY OFFICE NOTE  Date:  03/22/2016    Sheri Hernandez Date of Birth: 15-Jan-1956 Medical Record #[id number]  PCP:  Odette Fraction, MD  Cardiologist:  Jerel Shepherd    Chief Complaint  Patient presents with  . Coronary Artery Disease  . Chest Pain    6 week check - seen for Dr. Burt Knack    History of Present Illness: Sheri Hernandez is a 60 y.o. female who presents today for a 6 week check. Seen for Dr. Burt Knack.   She has a history of metastatic renal cell carcinoma. She is followed at [facility]. Other issues include asthma and sarcoid.  She presented in May 2016 with acute coronary syndrome. She was found to have non-ST elevation infarction and classic symptoms of crescendo angina. Cardiac catheterization demonstrated a critical RCA stenosis. An attempted PCI was made but there was a large dissection and subsequent acute inferior STEMI. The patient was taken emergently for CABG by Dr. Servando Snare. She underwent saphenous vein bypass to the right PDA and PLA branches.  She has been very resistant to CV risk factor modification. I have felt that she has basically become paralyzed by her cardiac diagnosis and has remained very inactive as a result.   Seen last month by Dr. Burt Knack - endorsed some chest pain and shortness of breath.  Somewhat characteristic of her prior chest pain syndrome.  He wanted to proceed with further evaluation - she declined until she was able to speak to her physician at [facility]. Did get her echo updated and her EF has improved.   Comes back today. Here alone today. Seems pretty nonchalant about her overall situation. Believes she "is better" than before but can't tell me why. More dramatic description about her chest pain (how it radiated thru out the chest and to the shoulders and jaw) that she was having. Says it is still there but "not as bad" - still with eating and with exertion. Says she is maybe 60 to 65% improved. Does some  yoga occasionally - no chest pain with this. Missed an appointment with Dr. Marcello Moores - says he is now out of the country. Continues to refuse to have stress test. Does not remember Dr. Burt Knack recommending stress testing or Imdur. She has used one NTG - right before July 4th - for a spell of chest pain - had very prompt relief.    Past Medical History  Diagnosis Date  . Papilloma of breast 2012    left. lumpectomy  . Anxiety   . Complication of anesthesia     hard time waking up 20 yrs. ago when having wisdon teeth removed  . Hypertension   . Shortness of breath     WITH EXERTION  . 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  . Lung mass     LUNG MASSES--COUGH-BIOPSY NON-DIAGNOSTIC.  HX OF MEDIASTINAL MASS AND LUNG MASSES AGE 25 -NEGATIVE BIOSPY-BUT GIVEN DIAGNOSIS OF SARCOIDOSIS.  WORK UP CONTINUING ON DIAGNOSIS FOR PT'S LUNG MASSES--SHE HAS BIOPSY PROVEN KIDNEY CANCER  . S/p nephrectomy     left, due to metastatic RCC, 3/14  . Cancer ([place])   . Asthma     Past Surgical History  Procedure Laterality Date  .  Tonsillectomy  1967  . Bronchial brush biopsy    . Wids    . Wisdom tooth extraction    . Neck lesion biopsy    . Breast biopsy  08/18/2011    Procedure: BREAST BIOPSY WITH NEEDLE LOCALIZATION;  Surgeon: Merrie Roof, MD;  Location: [facility];  Service: General;  Laterality: Left;  left breast biopsy with needle localization  . Video bronchoscopy  10/11/2012    Procedure: VIDEO BRONCHOSCOPY WITH FLUORO;  Surgeon: Kathee Delton, MD;  Location: [facility];  Service: Cardiopulmonary;  Laterality: Bilateral;  . Laparoscopic nephrectomy Left 11/22/2012    Procedure: LAPAROSCOPIC NEPHRECTOMY;  Surgeon: Dutch Gray, MD;  Location: [facility];  Service: Urology;  Laterality: Left;  . Left heart  catheterization with coronary angiogram N/A 01/02/2015    Procedure: LEFT HEART CATHETERIZATION WITH CORONARY ANGIOGRAM;  Surgeon: Leonie Man, MD;  Location: [facility] CATH LAB;  Service: Cardiovascular;  Laterality: N/A;  . Coronary artery bypass graft N/A 01/02/2015    Procedure: CORONARY ARTERY BYPASS GRAFTING (CABG)TIMES TWO USING RIGHT GREATER SAPHENOUS LEG VEIN HARVESTED ENDOSCOPICALLY;  Surgeon: [name], MD;  Location: [facility];  Service: Open Heart Surgery;  Laterality: N/A;     Medications: Current Outpatient Prescriptions  Medication Sig Dispense Refill  . aspirin 81 MG EC tablet Take 1 tablet (81 mg total) by mouth daily.    Marland Kitchen atorvastatin (LIPITOR) 80 MG tablet Take 1 tablet (80 mg total) by mouth daily. 30 tablet 9  . HYDROcodone-acetaminophen (NORCO/VICODIN) 5-325 MG per tablet Take 1 tablet by mouth every 4 (four) hours as needed for moderate pain.    Marland Kitchen levothyroxine (SYNTHROID, LEVOTHROID) 137 MCG tablet TAKE 1 TABLET (137 MCG TOTAL) BY MOUTH DAILY BEFORE BREAKFAST. 30 tablet 11  . metoprolol tartrate (LOPRESSOR) 25 MG tablet TAKE 1/2 TABLET (12.5 MG TOTAL) BY MOUTH 2 (TWO) TIMES DAILY. 30 tablet 11  . nitroGLYCERIN (NITROSTAT) 0.4 MG SL tablet Place 1 tablet (0.4 mg total) under the tongue every 5 (five) minutes as needed for chest pain. 25 tablet 1  . oxyCODONE (OXY IR/ROXICODONE) 5 MG immediate release tablet Take 1 tablet (5 mg total) by mouth every 3 (three) hours as needed for severe pain. 30 tablet 0  . isosorbide mononitrate (IMDUR) 30 MG 24 hr tablet Take 0.5 tablets (15 mg total) by mouth daily. 15 tablet 11   No current facility-administered medications for this visit.    Allergies: No Known Allergies  Social History: The patient  reports that she quit smoking about 3 years ago. Her smoking use included Cigarettes. She has a 60 pack-year smoking history. She has never used smokeless tobacco. She reports that she does not drink alcohol or use illicit drugs.     Family History: The patient's family history includes Breast cancer in her maternal aunt; Cancer in her maternal grandfather; Colon cancer in her maternal aunt; Heart attack in her father; Heart disease in her brother, father, maternal grandmother, and mother; Skin cancer in her maternal uncle; Stomach cancer in her maternal uncle; Stroke in her brother.   Review of Systems: Please see the history of present illness.   Otherwise, the review of systems is positive for none.   All other systems are reviewed and negative.   Physical Exam: VS:  BP 140/100 mmHg  Pulse 80  Ht '5\' 8"'$  (1.727 m)  Wt 194 lb (87.998 kg)  BMI 29.50 kg/m2 .  BMI Body mass index is 29.5 kg/(m^2).  Wt Readings from  Last 3 Encounters:  03/22/16 194 lb (87.998 kg)  02/09/16 190 lb 9.6 oz (86.456 kg)  11/24/15 185 lb (83.915 kg)    General: Alert. She is in no acute distress. Very matter of fact in her demeanor. HEENT: Normal. Neck: Supple, no JVD, carotid bruits, or masses noted.  Cardiac: Regular rate and rhythm. No murmurs, rubs, or gallops. No edema.  Respiratory:  Lungs are clear to auscultation bilaterally with normal work of breathing.  GI: Soft and nontender.  MS: No deformity or atrophy. Gait and ROM intact. Skin: Warm and dry. Color is normal.  Neuro:  Strength and sensation are intact and no gross focal deficits noted.  Psych: Alert, appropriate and with normal affect.   LABORATORY DATA:  EKG:  EKG is not ordered today.  Lab Results  Component Value Date   WBC 8.1 01/06/2015   HGB 8.0* 01/06/2015   HCT 25.1* 01/06/2015   PLT 176 01/06/2015   GLUCOSE 103* 01/06/2015   CHOL 90 01/03/2015   TRIG 77 01/03/2015   HDL 15* 01/03/2015   LDLCALC 60 01/03/2015   ALT 23 01/03/2015   AST 59* 01/03/2015   NA 135 01/06/2015   K 3.8 01/06/2015   CL 100* 01/06/2015   CREATININE 1.05* 01/06/2015   BUN 21* 01/06/2015   CO2 24 01/06/2015   TSH 0.262* 01/03/2015   INR 1.62* 01/03/2015   HGBA1C 6.1*  01/03/2015    BNP (last 3 results) No results for input(s): BNP in the last 8760 hours.  ProBNP (last 3 results) No results for input(s): PROBNP in the last 8760 hours.   Other Studies Reviewed Today:  Coronary angiography: Coronary dominance: Right   Left Main: Normal  Left Anterior Descending (LAD): Moderately calcified and normal in size. There is diffuse 20% disease proximally. There is diffuse 40% disease in the midsegment.  1st diagonal (D1): Small in size with minor irregularities.  2nd diagonal (D2): Large in size with 20% ostial stenosis.  3rd diagonal (D3): Very small in size.  Circumflex (LCx): Normal in size and nondominant. The vessel is moderately calcified. There is a 95% proximal stenosis and 80% mid stenosis.  1st obtuse marginal: Small in size with minor irregularities.  2nd obtuse marginal: Medium in size with minor irregularities.  3rd obtuse marginal: Medium in size with minor irregularities.   Right Coronary Artery: Large in size and dominant. The vessel is heavily calcified in the midsegment. It is aneurysmal in the mid and distal segment. There is 95% proximal stenosis followed by diffuse 80% mid stenosis. There is an 80% stenosis distally between 2 aneurysmal segments.  Posterior descending artery: Normal in size with 95% proximal stenosis.  Posterior AV segment: Normal  Posterolateral branchs: PL 1 is small in size. PL 2 is very large in size with diffuse 50% proximal stenosis.  Left ventriculography: Left ventricular systolic function is (normal , LVEF is estimated at 55 %, there is mild mitral regurgitation   Echo Study Conclusions from 02/2016  - Left ventricle: The cavity size was normal. Systolic function was  normal. The estimated ejection fraction was in the range of 55%  to 60%. Wall motion was normal; there were no regional wall  motion abnormalities. Doppler parameters are consistent with  abnormal left  ventricular relaxation (grade 1 diastolic  dysfunction). - Atrial septum: No defect or patent foramen ovale was identified.  Assessment/Plan:  1. Inferior MI with failed attempt at PCI - s/p emergent CABG x 2 in 12/2014 - stable progress.  One year out from her surgery. She has had symptoms typical and atypical for angina - Myoview recommended but she is very fearful - has declined Imdur - she is now agreeable to trying low dose Imdur - will try her on 15 mg - I tried to focus more on how she would be able to do more if we could get her symptoms under better control. See back in about 6 weeks. Was cautioned about headache.   2. Shortness of breath - see above.   2. Metastatic renal cell carcinoma - followed by [facility] but missed last appointment.  3. Depression  4. PAF - post op - no longer on amiodarone. In sinus by exam.   5. HTN - BP with just fair control. She is very resistant to taking any more medicine.   6. HLD - remains on statin therapy. Labs done at [facility]  Current medicines are reviewed with the patient today.  The patient does not have concerns regarding medicines other than what has been noted above.  The following changes have been made:  See above.  Labs/ tests ordered today include:   No orders of the defined types were placed in this encounter.     Disposition:   FU with me in 6 weeks.   Patient is agreeable to this plan and will call if any problems develop in the interim.   Signed: Burtis Junes, RN, ANP-C 03/22/2016 10:50 AM  [facility] [address] [place], [place]  [address] Phone: [phone] Fax: [phone]

## 2016-03-22 NOTE — Patient Instructions (Addendum)
We will be checking the following labs today - NONE   Medication Instructions:    Continue with your current medicines.   I sent in your refill for Metoprolol  I am adding Imdur 30 mg to take just 1/2 a tablet daily    Testing/Procedures To Be Arranged:  N/A  Follow-Up:   See me in 6 weeks try to do on a day that Dr. Burt Knack is here if possible    Other Special Instructions:   N/A    If you need a refill on your cardiac medications before your next appointment, please call your pharmacy.   Call the [place] office at [phone] if you have any questions, problems or concerns.

## 2016-04-10 ENCOUNTER — Other Ambulatory Visit: Payer: Self-pay | Admitting: Cardiovascular Disease

## 2016-04-10 ENCOUNTER — Other Ambulatory Visit: Payer: Self-pay | Admitting: Family Medicine

## 2016-04-10 DIAGNOSIS — R609 Edema, unspecified: Secondary | ICD-10-CM

## 2016-04-10 DIAGNOSIS — R002 Palpitations: Secondary | ICD-10-CM

## 2016-04-10 DIAGNOSIS — I2510 Atherosclerotic heart disease of native coronary artery without angina pectoris: Secondary | ICD-10-CM

## 2016-05-02 ENCOUNTER — Encounter: Payer: Self-pay | Admitting: Nurse Practitioner

## 2016-05-02 ENCOUNTER — Ambulatory Visit (INDEPENDENT_AMBULATORY_CARE_PROVIDER_SITE_OTHER): Payer: 59 | Admitting: Nurse Practitioner

## 2016-05-02 VITALS — BP 140/90 | HR 89 | Ht 68.0 in | Wt 198.0 lb

## 2016-05-02 DIAGNOSIS — E785 Hyperlipidemia, unspecified: Secondary | ICD-10-CM

## 2016-05-02 DIAGNOSIS — I25708 Atherosclerosis of coronary artery bypass graft(s), unspecified, with other forms of angina pectoris: Secondary | ICD-10-CM

## 2016-05-02 NOTE — Patient Instructions (Addendum)
We will be checking the following labs today - NONE   Medication Instructions:    Continue with your current medicines.     Testing/Procedures To Be Arranged:  N/A  Follow-Up:   See Dr. Burt Knack in 3 to 4 months.     Other Special Instructions:   Ok to try some over the counter Prilosec or Nexium    If you need a refill on your cardiac medications before your next appointment, please call your pharmacy.   Call the [place] office at [phone] if you have any questions, problems or concerns.

## 2016-05-02 NOTE — Progress Notes (Signed)
CARDIOLOGY OFFICE NOTE  Date:  05/02/2016    Sheri Hernandez Date of Birth: Dec 12, 1955 Medical Record #[id number]  PCP:  Odette Fraction, MD  Cardiologist:  Jerel Shepherd    Chief Complaint  Patient presents with  . Coronary Artery Disease    Follow up visit - seen for Dr. Burt Knack    History of Present Illness: Sheri Hernandez is a 60 y.o. female who presents today for a follow up visit. Seen for Dr. Burt Knack.   She has a history of metastatic renal cell carcinoma. She is followed at [facility]. Other issues include asthma and sarcoid.  She presented in May 2016 with acute coronary syndrome. She was found to have non-ST elevation infarction and classic symptoms of crescendo angina. Cardiac catheterization demonstrated a critical RCA stenosis. An attempted PCI was made but there was a large dissection and subsequent acute inferior STEMI. The patient was taken emergently for CABG by Dr. Servando Snare. She underwent saphenous vein bypass to the right PDA and PLA branches.  She has been very resistant to CV risk factor modification. I have felt that she has basically become paralyzed by her cardiac diagnosis and has remained very inactive as a result.   Seen back in June by Dr. Burt Knack - endorsed some chest pain and shortness of breath.  Somewhat characteristic of her prior chest pain syndrome.  He wanted to proceed with further evaluation - she declined until she was able to speak to her physician at [place]. Did get her echo updated and her EF has improved.   I saw her then in July - still with lots of issues - she did not remember Dr. Burt Knack recommending stress testing or Imdur - refused stress test for me but was agreeable to letting me add some low dose Imdur.   Comes back today. Here alone today. She says she is "better but not better". Very hard to follow. Says "life gets in the way". She did not tolerate Imdur - not really clear to me as to why - says she  called here - no phone note noted. She tells me her chest pain is better off the Imdur. She notes that she seems to have more issues about 30 minutes after eating. Has been to the beach recently - admits to getting too much salt. Weight is going up. She remains very sedentary.  Admits she is depressed. Does not want to see counseling - went once and it did not go well. She does walk her dog some - this will make her somewhat breathless.   Past Medical History:  Diagnosis Date  . Anxiety   . Asthma   . Cancer ([place])   . Complication of anesthesia    hard time waking up 20 yrs. ago when having wisdon teeth removed  . Hypertension   . Lung mass    LUNG MASSES--COUGH-BIOPSY NON-DIAGNOSTIC.  HX OF MEDIASTINAL MASS AND LUNG MASSES AGE 45 -NEGATIVE BIOSPY-BUT GIVEN DIAGNOSIS OF SARCOIDOSIS.  WORK UP CONTINUING ON DIAGNOSIS FOR PT'S LUNG MASSES--SHE HAS BIOPSY PROVEN KIDNEY CANCER  . 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  . Papilloma of breast 2012   left. lumpectomy  . S/p nephrectomy    left, due to metastatic  RCC, 3/14  . Shortness of breath    WITH EXERTION    Past Surgical History:  Procedure Laterality Date  . BREAST BIOPSY  08/18/2011   Procedure: BREAST BIOPSY WITH NEEDLE LOCALIZATION;  Surgeon: Merrie Roof, MD;  Location: [facility];  Service: General;  Laterality: Left;  left breast biopsy with needle localization  . BRONCHIAL BRUSH BIOPSY    . CORONARY ARTERY BYPASS GRAFT N/A 01/02/2015   Procedure: CORONARY ARTERY BYPASS GRAFTING (CABG)TIMES TWO USING RIGHT GREATER SAPHENOUS LEG VEIN HARVESTED ENDOSCOPICALLY;  Surgeon: [name], MD;  Location: [facility];  Service: Open Heart Surgery;  Laterality: N/A;  . LAPAROSCOPIC NEPHRECTOMY Left 11/22/2012   Procedure: LAPAROSCOPIC  NEPHRECTOMY;  Surgeon: Dutch Gray, MD;  Location: [facility];  Service: Urology;  Laterality: Left;  . LEFT HEART CATHETERIZATION WITH CORONARY ANGIOGRAM N/A 01/02/2015   Procedure: LEFT HEART CATHETERIZATION WITH CORONARY ANGIOGRAM;  Surgeon: Leonie Man, MD;  Location: [facility] C[facility];  Service: Cardiovascular;  Laterality: N/A;  . NECK LESION BIOPSY    . TONSILLECTOMY  1967  . VIDEO BRONCHOSCOPY  10/11/2012   Procedure: VIDEO BRONCHOSCOPY WITH FLUORO;  Surgeon: Kathee Delton, MD;  Location: [facility];  Service: Cardiopulmonary;  Laterality: Bilateral;  . WIDS    . WISDOM TOOTH EXTRACTION       Medications: Current Outpatient Prescriptions  Medication Sig Dispense Refill  . aspirin 81 MG EC tablet Take 1 tablet (81 mg total) by mouth daily.    Marland Kitchen atorvastatin (LIPITOR) 80 MG tablet Take 1 tablet (80 mg total) by mouth daily. 30 tablet 9  . furosemide (LASIX) 20 MG tablet Take 20 mg by mouth daily.     Marland Kitchen HYDROcodone-acetaminophen (NORCO/VICODIN) 5-325 MG per tablet Take 1 tablet by mouth every 4 (four) hours as needed for moderate pain.    Marland Kitchen levothyroxine (SYNTHROID, LEVOTHROID) 137 MCG tablet TAKE 1 TABLET (137 MCG TOTAL) BY MOUTH DAILY BEFORE BREAKFAST. 30 tablet 5  . LORazepam (ATIVAN) 1 MG tablet Take 1 mg by mouth every 8 (eight) hours as needed for anxiety.     . metoprolol tartrate (LOPRESSOR) 25 MG tablet TAKE 1/2 TABLET (12.5 MG TOTAL) BY MOUTH 2 (TWO) TIMES DAILY. 30 tablet 11  . nitroGLYCERIN (NITROSTAT) 0.4 MG SL tablet Place 1 tablet (0.4 mg total) under the tongue every 5 (five) minutes as needed for chest pain. 25 tablet 1  . oxyCODONE (OXY IR/ROXICODONE) 5 MG immediate release tablet Take 1 tablet (5 mg total) by mouth every 3 (three) hours as needed for severe pain. 30 tablet 0   No current facility-administered medications for this visit.     Allergies: No Known Allergies  Social History: The patient  reports that she quit smoking about 3 years ago. Her smoking use  included Cigarettes. She has a 60.00 pack-year smoking history. She has never used smokeless tobacco. She reports that she does not drink alcohol or use drugs.   Family History: The patient's family history includes Breast cancer in her maternal aunt; Cancer in her maternal grandfather; Colon cancer in her maternal aunt; Heart attack in her father; Heart disease in her brother, father, maternal grandmother, and mother; Skin cancer in her maternal uncle; Stomach cancer in her maternal uncle; Stroke in her brother.   Review of Systems: Please see the history of present illness.   Otherwise, the review of systems is positive for none.   All other systems are reviewed and negative.   Physical Exam: VS:  BP 140/90   Pulse 89   Ht '5\' 8"'$  (1.727 m)   Wt 198 lb (89.8 kg)   SpO2 96% Comment: at rest  BMI 30.11 kg/m  .  BMI Body mass index is 30.11 kg/m.  Wt Readings from Last 3 Encounters:  05/02/16 198 lb (89.8 kg)  03/22/16 194 lb (88 kg)  02/09/16 190 lb 9.6 oz (86.5 kg)    General: Affect is quite flat. She is alert and in no acute distress. Her weight is up.    HEENT: Normal.  Neck: Supple, no JVD, carotid bruits, or masses noted.  Cardiac: Regular rate and rhythm. No murmurs, rubs, or gallops. No edema.  Respiratory:  Lungs are clear to auscultation bilaterally with normal work of breathing.  GI: Soft and nontender.  MS: No deformity or atrophy. Gait and ROM intact.  Skin: Warm and dry. Color is normal.  Neuro:  Strength and sensation are intact and no gross focal deficits noted.  Psych: Alert, appropriate and with normal affect.   LABORATORY DATA:  EKG:  EKG is not ordered today.  Lab Results  Component Value Date   WBC 8.1 01/06/2015   HGB 8.0 (L) 01/06/2015   HCT 25.1 (L) 01/06/2015   PLT 176 01/06/2015   GLUCOSE 103 (H) 01/06/2015   CHOL 90 01/03/2015   TRIG 77 01/03/2015   HDL 15 (L) 01/03/2015   LDLCALC 60 01/03/2015   ALT 23 01/03/2015   AST 59 (H) 01/03/2015    NA 135 01/06/2015   K 3.8 01/06/2015   CL 100 (L) 01/06/2015   CREATININE 1.05 (H) 01/06/2015   BUN 21 (H) 01/06/2015   CO2 24 01/06/2015   TSH 0.262 (L) 01/03/2015   INR 1.62 (H) 01/03/2015   HGBA1C 6.1 (H) 01/03/2015    BNP (last 3 results) No results for input(s): BNP in the last 8760 hours.  ProBNP (last 3 results) No results for input(s): PROBNP in the last 8760 hours.   Other Studies Reviewed Today:  Coronary angiography: Coronary dominance: Right   Left Main: Normal  Left Anterior Descending (LAD): Moderately calcified and normal in size. There is diffuse 20% disease proximally. There is diffuse 40% disease in the midsegment.  1st diagonal (D1): Small in size with minor irregularities.  2nd diagonal (D2): Large in size with 20% ostial stenosis.  3rd diagonal (D3): Very small in size.  Circumflex (LCx): Normal in size and nondominant. The vessel is moderately calcified. There is a 95% proximal stenosis and 80% mid stenosis.  1st obtuse marginal: Small in size with minor irregularities.  2nd obtuse marginal: Medium in size with minor irregularities.  3rd obtuse marginal: Medium in size with minor irregularities.   Right Coronary Artery: Large in size and dominant. The vessel is heavily calcified in the midsegment. It is aneurysmal in the mid and distal segment. There is 95% proximal stenosis followed by diffuse 80% mid stenosis. There is an 80% stenosis distally between 2 aneurysmal segments.  Posterior descending artery: Normal in size with 95% proximal stenosis.  Posterior AV segment: Normal  Posterolateral branchs: PL 1 is small in size. PL 2 is very large in size with diffuse 50% proximal stenosis.  Left ventriculography: Left ventricular systolic function is (normal , LVEF is estimated at 55 %, there is mild mitral regurgitation   Echo Study Conclusions from 02/2016  - Left ventricle: The cavity size was normal. Systolic function  was  normal. The estimated ejection fraction was in the range of 55%  to 60%. Wall motion was normal; there were no regional wall  motion abnormalities. Doppler parameters are consistent with  abnormal left ventricular relaxation (grade 1 diastolic  dysfunction). - Atrial septum: No defect or patent foramen ovale was identified.  Assessment/Plan:  1. Inferior MI with failed attempt at PCI - s/p emergent CABG x 2 in 12/2014 - stable progress. Over one year out from her surgery. She has had symptoms typical and atypical for angina - Myoview recommended but she is very fearful - sounds like she did not tolerate her Imdur and does not wish to try again. Continues to refuse further testing. Would recommend OTC PPI if she likes. Really needs to embrace CV risk factor modification and unfortunately, I do not see this improving.   2. Shortness of breath - most likely multifactorial - she is quite sedentary.   2. Metastatic renal cell carcinoma - followed by [facility] but missed last appointment.  3. Depression - I think this is her biggest issue and unfortunately, not addressed.   4. PAF - post op - no longer on amiodarone. In sinus by exam.   5. HTN - BP up today - recheck by me is 140/100 - suspect elevated due to recent increase in salt. She is very resistant to taking any more medicine.   6. HLD - remains on statin therapy. Labs done at [facility].  Current medicines are reviewed with the patient today.  The patient does not have concerns regarding medicines other than what has been noted above.  The following changes have been made:  See above.  Labs/ tests ordered today include:   No orders of the defined types were placed in this encounter.    Disposition:   FU with Dr. Burt Knack next in 3 to 4 months.   Patient is agreeable to this plan and will call if any problems develop in the interim.   Signed: Burtis Junes, RN, ANP-C 05/02/2016 2:17 PM  [facility] [address] [place], [place]  [address] Phone: [phone] Fax: [phone]

## 2016-06-01 ENCOUNTER — Ambulatory Visit (INDEPENDENT_AMBULATORY_CARE_PROVIDER_SITE_OTHER): Payer: 59 | Admitting: Physician Assistant

## 2016-06-01 DIAGNOSIS — I10 Essential (primary) hypertension: Secondary | ICD-10-CM

## 2016-06-01 MED ORDER — FUROSEMIDE 20 MG PO TABS: 20.0000 mg | ORAL_TABLET | Freq: Every day | ORAL | 3 refills | Status: DC

## 2016-06-01 MED ORDER — AMLODIPINE BESYLATE 5 MG PO TABS: 5.0000 mg | ORAL_TABLET | Freq: Every day | ORAL | 3 refills | Status: DC

## 2016-06-01 NOTE — Progress Notes (Signed)
Patient ID: Sheri Hernandez MRN: [id number], DOB: [birth year], 60 y.o. Date of Encounter: 06/01/2016, 3:56 PM    Chief Complaint: Hypertension   HPI: 60 y.o. year old female came in for blood pressure check and the nurse got elevated readings that she was added to my schedule for visit.  She brings in some blood pressure readings that she has been getting over the last 7 days. He has a digital meter that reads her blood pressure and heart rate. Heart rate is consistently between 76-94 beats a minute. Blood pressure readings include the following-- 151/86, 159/82, 161/91, 127/63, 138/81, 144/87, 163/91, 167/90, 142/77, 146/95, 135/78, 167/88, 170/91, today she has gotten 172/97.  He sees oncology at [place] so.I do not have their notes to review but she states that her blood pressure has been running good there.  Did review her last office note at cardiology 05/02/16. At visit the nurse had documented 140/90. Remer Macho nurse practitioner noted that she recheck blood pressure herself and got 140/100 and that if it was running any higher she would need to add medications.  Pt states her follow-up appointment at oncology is November 10 and at cardiology isn't until December.  No other complaints or concerns today.     Home Meds:   Outpatient Medications Prior to Visit  Medication Sig Dispense Refill  . aspirin 81 MG EC tablet Take 1 tablet (81 mg total) by mouth daily.    Marland Kitchen atorvastatin (LIPITOR) 80 MG tablet Take 1 tablet (80 mg total) by mouth daily. 30 tablet 9  . HYDROcodone-acetaminophen (NORCO/VICODIN) 5-325 MG per tablet Take 1 tablet by mouth every 4 (four) hours as needed for moderate pain.    Marland Kitchen levothyroxine (SYNTHROID, LEVOTHROID) 137 MCG tablet TAKE 1 TABLET (137 MCG TOTAL) BY MOUTH DAILY BEFORE BREAKFAST. 30 tablet 5  . LORazepam (ATIVAN) 1 MG tablet Take 1 mg by mouth every 8 (eight) hours as needed for anxiety.     . metoprolol tartrate (LOPRESSOR) 25 MG tablet  TAKE 1/2 TABLET (12.5 MG TOTAL) BY MOUTH 2 (TWO) TIMES DAILY. 30 tablet 11  . nitroGLYCERIN (NITROSTAT) 0.4 MG SL tablet Place 1 tablet (0.4 mg total) under the tongue every 5 (five) minutes as needed for chest pain. 25 tablet 1  . oxyCODONE (OXY IR/ROXICODONE) 5 MG immediate release tablet Take 1 tablet (5 mg total) by mouth every 3 (three) hours as needed for severe pain. 30 tablet 0  . furosemide (LASIX) 20 MG tablet Take 20 mg by mouth daily.      No facility-administered medications prior to visit.     Allergies: No Known Allergies    Review of Systems: See HPI for pertinent ROS. All other ROS negative.    Physical Exam: BP readings from the nurse: 160/102 General:  Appears in no acute distress. Neck: Supple. No thyromegaly. No lymphadenopathy. Lungs: Clear bilaterally to auscultation without wheezes, rales, or rhonchi. Breathing is unlabored. Heart: Regular rhythm. No murmurs, rubs, or gallops. Msk:  Strength and tone normal for age. Extremities/Skin: She does have some LE edema, right> left Neuro: Alert and oriented X 3. Moves all extremities spontaneously. Gait is normal. CNII-XII grossly in tact. Psych:  Responds to questions appropriately with a normal affect.     ASSESSMENT AND PLAN:  60 y.o. year old female with  1. Essential hypertension She is on metoprolol. Given her renal carcinoma-- I do not want to use medications that affect the kidney. Therefore will add Norvasc 5 mg  daily. I am concerned because she has some lower extremity edema on exam, but she states that she is due for refill on her furosemide.  She states that she has been taking it daily up until today. She is to continue to take furosemide. Have her schedule follow-up office visit here in 2 weeks to follow-up her blood pressure. - amLODipine (NORVASC) 5 MG tablet; Take 1 tablet (5 mg total) by mouth daily.  Dispense: 30 tablet; Refill: 3   Signed, [address] [place], [place], [facility] 06/01/2016 3:56 PM

## 2016-06-14 ENCOUNTER — Telehealth: Payer: Self-pay | Admitting: Cardiovascular Disease

## 2016-06-14 NOTE — Telephone Encounter (Signed)
Follow up   Sheri Hernandez , is wanting you to call her husband  number [phone] to reach her

## 2016-06-14 NOTE — Telephone Encounter (Signed)
This is not my patient.  Please redirect to pcp.

## 2016-06-14 NOTE — Telephone Encounter (Signed)
Pt c/o BP issue: STAT if pt c/o blurred vision, one-sided weakness or slurred speech  1. What are your last 5 BP readings? 99/54 , 133/66   2. Are you having any other symptoms (ex. Dizziness, headache, blurred vision, passed out)?Dizziness , fingers were numb and tingly   3. What is your BP issue? She is on Amolodipine and is wanting to know should she take medication this evening or tomorrow or should her dosage be lowered .  Please call

## 2016-06-14 NOTE — Telephone Encounter (Signed)
I spoke with the pt and she called our office because she has attempted to reach her PCP multiple times with out a successful return phone call.  The pt was recently started on Amlodipine by PCP and her BP today at 2:00 was 99/54 and 4 PM 133/66. The pt was concerned of her BP being so much lower than her normal.  The pt would like to know if she should take amlodipine tomorrow morning.  I advised her to check her BP in the morning and if her SBP is greater than 100 then she can take half of amlodipine and continue to monitor BP. She will attempt to reach her PCP tomorrow for further instructions. Pt agreed with plan.

## 2016-06-15 ENCOUNTER — Telehealth: Payer: Self-pay

## 2016-06-15 NOTE — Telephone Encounter (Signed)
PT husband called and stated his wife was at [facility] on 10-10 and her blood pressure had dropped to 99/54 and she has tingling in her fingers and toes.   Mr. Montijo is wanting to know if they should do in reagrds to the B/P dose.

## 2016-06-15 NOTE — Telephone Encounter (Signed)
Spoke with pt informed her to stop medication  Amlodipine (Norvasc)

## 2016-06-15 NOTE — Telephone Encounter (Signed)
She recently came in for office visit because her blood pressure was running high. She saw me for OV 06/01/2016---Added Norvasc '5mg'$ .  ---Stop this medication.------

## 2016-06-16 ENCOUNTER — Ambulatory Visit: Payer: Self-pay | Admitting: Physician Assistant

## 2016-06-16 NOTE — Telephone Encounter (Signed)
Patient aware of providers recommendations.   I did speak to pt about her trying to call our office and she was calling the correct number with the options and she choose [facility] nurse and option 0. She did not receive call back so she contacted other office and apparently called our office again yesterday and talked to Manuela Schwartz and an appt was made for her today with [facility] - I cxd that. The other office was contacted 2 days ago and if she did not choose our option for triage it is possible that we have not yet gotten her message.

## 2016-06-16 NOTE — Telephone Encounter (Signed)
Can we look into phone situation, how many messages did she leave and what time.

## 2016-06-16 NOTE — Telephone Encounter (Signed)
Pt will decrease Amlodipine to 2.'5mg'$  not '5mg'$  as she was already taking '5mg'$ .

## 2016-06-16 NOTE — Telephone Encounter (Signed)
Not sure why no one is returning her phone call but cut amlodipine in half to 5 mg a day and report BP to [place] in 1 week.

## 2016-06-23 ENCOUNTER — Other Ambulatory Visit: Payer: Self-pay | Admitting: Cardiovascular Disease

## 2016-06-23 DIAGNOSIS — R002 Palpitations: Secondary | ICD-10-CM

## 2016-06-23 DIAGNOSIS — R609 Edema, unspecified: Secondary | ICD-10-CM

## 2016-06-23 DIAGNOSIS — I2510 Atherosclerotic heart disease of native coronary artery without angina pectoris: Secondary | ICD-10-CM

## 2016-07-06 ENCOUNTER — Ambulatory Visit (INDEPENDENT_AMBULATORY_CARE_PROVIDER_SITE_OTHER): Payer: 59 | Admitting: Physician Assistant

## 2016-07-06 ENCOUNTER — Encounter: Payer: Self-pay | Admitting: Physician Assistant

## 2016-07-06 VITALS — BP 118/92 | HR 107 | Temp 98.0°F | Resp 18 | Ht 68.0 in

## 2016-07-06 DIAGNOSIS — M542 Cervicalgia: Secondary | ICD-10-CM

## 2016-07-06 MED ORDER — CYCLOBENZAPRINE HCL 10 MG PO TABS: 10.0000 mg | ORAL_TABLET | Freq: Three times a day (TID) | ORAL | 1 refills | Status: DC | PRN

## 2016-07-06 MED ORDER — HYDROCODONE-ACETAMINOPHEN 5-325 MG PO TABS: 1.0000 | ORAL_TABLET | ORAL | 0 refills | Status: DC | PRN

## 2016-07-06 NOTE — Progress Notes (Addendum)
Patient ID: Sheri Hernandez MRN: [id number], DOB: [birth year], 58 y.o. Date of Encounter: 07/06/2016, 3:54 PM    Chief Complaint:  Chief Complaint  Patient presents with  . neck arm pain     HPI: 60 y.o. year old female presents with above.   She says that this past Friday night she went to a party. Says that it was [employer] to [employer]. Says that was what she listened to when she was young. Says that she "wanted to party like she did back then." Says that she was putting her arms in the air, was "head-banging" etc. Also says that husband told her that when he saw her sleeping after that, that she "was contorted like a pretzel"--was sleeping with her arm and neck in unusual positions. Says that since Saturday she has been having this severe pain on both sides of her neck and has been having muscles spasms in both sides of her neck.  Says that the past 3 days she has been sitting with her chin down on her chest because it hurts to try to hold her head up. Says that her rib cage feels like it has been beat up and she is sore all over.      Home Meds:   Outpatient Medications Prior to Visit  Medication Sig Dispense Refill  . amLODipine (NORVASC) 5 MG tablet Take 1 tablet (5 mg total) by mouth daily. 30 tablet 3  . aspirin 81 MG EC tablet Take 1 tablet (81 mg total) by mouth daily.    Marland Kitchen atorvastatin (LIPITOR) 40 MG tablet TAKE 1 TABLET (40 MG TOTAL) BY MOUTH DAILY. 90 tablet 0  . furosemide (LASIX) 20 MG tablet Take 1 tablet (20 mg total) by mouth daily. 30 tablet 3  . HYDROcodone-acetaminophen (NORCO/VICODIN) 5-325 MG per tablet Take 1 tablet by mouth every 4 (four) hours as needed for moderate pain.    Marland Kitchen levothyroxine (SYNTHROID, LEVOTHROID) 137 MCG tablet TAKE 1 TABLET (137 MCG TOTAL) BY MOUTH DAILY BEFORE BREAKFAST. 30 tablet 5  . LORazepam (ATIVAN) 1 MG tablet Take 1 mg by mouth every 8 (eight) hours as needed for anxiety.     . metoprolol tartrate (LOPRESSOR) 25 MG tablet  TAKE 1/2 TABLET (12.5 MG TOTAL) BY MOUTH 2 (TWO) TIMES DAILY. 30 tablet 11  . nitroGLYCERIN (NITROSTAT) 0.4 MG SL tablet Place 1 tablet (0.4 mg total) under the tongue every 5 (five) minutes as needed for chest pain. 25 tablet 1  . oxyCODONE (OXY IR/ROXICODONE) 5 MG immediate release tablet Take 1 tablet (5 mg total) by mouth every 3 (three) hours as needed for severe pain. (Patient not taking: Reported on 07/06/2016) 30 tablet 0   No facility-administered medications prior to visit.     Allergies: No Known Allergies    Review of Systems: See HPI for pertinent ROS. All other ROS negative.    Physical Exam: 118/84--Blood pressure , 87 pulse , temperature 98 F (36.7 C), temperature source Oral, resp. rate 18, height '5\' 8"'$  (1.727 m), SpO2 98 %., There is no height or weight on file to calculate BMI. General:  WF. Moaning and groaning periodically secondary to pain, muscle spasms. Neck: Supple. No thyromegaly. No lymphadenopathy. Lungs: Clear bilaterally to auscultation without wheezes, rales, or rhonchi. Breathing is unlabored. Heart: Regular rhythm. No murmurs, rubs, or gallops. Msk:  Severe tenderness with palpation to both sides of the neck. Has very limited range of motion of the neck secondary to pain and  muscle spasm. Extremities/Skin: Warm and dry. Neuro: Alert and oriented X 3. Moves all extremities spontaneously. Gait is normal. CNII-XII grossly in tact. Psych:  Responds to questions appropriately with a normal affect.     ASSESSMENT AND PLAN:  60 y.o. year old female with  1. Bilateral neck pain First--- discussed whether she feels that she needs to go to the emergency room where she can get IV medication to give her more immediate relief and she says absolutely not. Says that she has already been dealing with this for 5 days. Then--- noted that her son came with her and drove her to the appointment and offered to give IM medication that would have more immediate onset of  relief. She defers this as well. Ultimately, Rxed; - HYDROcodone-acetaminophen (NORCO/VICODIN) 5-325 MG tablet; Take 1 tablet by mouth every 4 (four) hours as needed for moderate pain.  Dispense: 20 tablet; Refill: 0 - cyclobenzaprine (FLEXERIL) 10 MG tablet; Take 1 tablet (10 mg total) by mouth 3 (three) times daily as needed for muscle spasms.  Dispense: 30 tablet; Refill: 1 She can use these medications as needed for relief. Also recommended applying heat to each side of the neck for relief as well. --NOTE--- drug registry was printed and reviewed. There is only one prescription showing up on there at all and that is from 03/27/16 for hydrocodone No. 90. Also note that today I am giving her a five-day supply for acute pain.  [address] [place], [place], [facility] 07/06/2016 3:54 PM

## 2016-07-20 ENCOUNTER — Encounter: Payer: Self-pay | Admitting: Physician Assistant

## 2016-07-20 ENCOUNTER — Ambulatory Visit (INDEPENDENT_AMBULATORY_CARE_PROVIDER_SITE_OTHER): Payer: 59 | Admitting: Physician Assistant

## 2016-07-20 VITALS — BP 130/90 | HR 108 | Temp 97.5°F | Resp 18 | Wt 194.0 lb

## 2016-07-20 DIAGNOSIS — R079 Chest pain, unspecified: Secondary | ICD-10-CM

## 2016-07-20 DIAGNOSIS — M542 Cervicalgia: Secondary | ICD-10-CM

## 2016-07-20 DIAGNOSIS — M79601 Pain in right arm: Secondary | ICD-10-CM

## 2016-07-20 DIAGNOSIS — C649 Malignant neoplasm of unspecified kidney, except renal pelvis: Secondary | ICD-10-CM | POA: Diagnosis not present

## 2016-07-20 DIAGNOSIS — M79602 Pain in left arm: Secondary | ICD-10-CM

## 2016-07-20 NOTE — Progress Notes (Signed)
Patient ID: Sheri Hernandez MRN: [id number], DOB: 1956-05-18, 60 y.o. Date of Encounter: 07/20/2016, 2:39 PM    Chief Complaint:  Chief Complaint  Patient presents with  . pain in chest and back     HPI: 60 y.o. year old female presents with above.   07/06/2016: She says that this past Friday night she went to a party. Says that it was [employer] to [employer]. Says that was what she listened to when she was young. Says that she "wanted to party like she did back then." Says that she was putting her arms in the air, was "head-banging" etc. Also says that husband told her that when he saw her sleeping after that, that she "was contorted like a pretzel"--was sleeping with her arm and neck in unusual positions. Says that since Saturday she has been having this severe pain on both sides of her neck and has been having muscles spasms in both sides of her neck.  Says that the past 3 days she has been sitting with her chin down on her chest because it hurts to try to hold her head up. Says that her rib cage feels like it has been beat up and she is sore all over.  1. Bilateral neck pain First--- discussed whether she feels that she needs to go to the emergency room where she can get IV medication to give her more immediate relief and she says absolutely not. Says that she has already been dealing with this for 5 days. Then--- noted that her son came with her and drove her to the appointment and offered to give IM medication that would have more immediate onset of relief. She defers this as well. Ultimately, Rxed; - HYDROcodone-acetaminophen (NORCO/VICODIN) 5-325 MG tablet; Take 1 tablet by mouth every 4 (four) hours as needed for moderate pain.  Dispense: 20 tablet; Refill: 0 - cyclobenzaprine (FLEXERIL) 10 MG tablet; Take 1 tablet (10 mg total) by mouth 3 (three) times daily as needed for muscle spasms.  Dispense: 30 tablet; Refill: 1 She can use these medications as needed for relief. Also  recommended applying heat to each side of the neck for relief as well. --NOTE--- drug registry was printed and reviewed. There is only one prescription showing up on there at all and that is from 03/27/16 for hydrocodone No. 90. Also note that today I am giving her a five-day supply for acute pain.  07/20/2016:  Today she reports that she still has pain in both sides of her neck and can barely turn her neck each direction secondary to this pain and stiffness. Says that she is still hurting down towards her shoulders and upper arms bilaterally and across her chest. Says that she wants to x-ray all of these areas. Concerned that she has cancer in these areas causing this pain to persist. She says that the hydrocodone makes her sleep and does knock out the pain temporarily but then the pain is back. Says that she wants to know why she is still hurting and wants to make sure she doesn't have cancer in these areas. She does not need refill on any of the medicine (muscle relaxer or pain med ) at this time.  Home Meds:   Outpatient Medications Prior to Visit  Medication Sig Dispense Refill  . amLODipine (NORVASC) 5 MG tablet Take 1 tablet (5 mg total) by mouth daily. 30 tablet 3  . aspirin 81 MG EC tablet Take 1 tablet (81 mg total)  by mouth daily.    Marland Kitchen atorvastatin (LIPITOR) 40 MG tablet TAKE 1 TABLET (40 MG TOTAL) BY MOUTH DAILY. 90 tablet 0  . cyclobenzaprine (FLEXERIL) 10 MG tablet Take 1 tablet (10 mg total) by mouth 3 (three) times daily as needed for muscle spasms. 30 tablet 1  . furosemide (LASIX) 20 MG tablet Take 1 tablet (20 mg total) by mouth daily. 30 tablet 3  . HYDROcodone-acetaminophen (NORCO/VICODIN) 5-325 MG tablet Take 1 tablet by mouth every 4 (four) hours as needed for moderate pain. 20 tablet 0  . levothyroxine (SYNTHROID, LEVOTHROID) 137 MCG tablet TAKE 1 TABLET (137 MCG TOTAL) BY MOUTH DAILY BEFORE BREAKFAST. 30 tablet 5  . LORazepam (ATIVAN) 1 MG tablet Take 1 mg by mouth every 8  (eight) hours as needed for anxiety.     . metoprolol tartrate (LOPRESSOR) 25 MG tablet TAKE 1/2 TABLET (12.5 MG TOTAL) BY MOUTH 2 (TWO) TIMES DAILY. 30 tablet 11  . nitroGLYCERIN (NITROSTAT) 0.4 MG SL tablet Place 1 tablet (0.4 mg total) under the tongue every 5 (five) minutes as needed for chest pain. 25 tablet 1   No facility-administered medications prior to visit.     Allergies: No Known Allergies    Review of Systems: See HPI for pertinent ROS. All other ROS negative.    Physical Exam: 118/84--Blood pressure , 87 pulse , temperature 98 F (36.7 C), temperature source Oral, resp. rate 18, height '5\' 8"'$  (1.727 m), SpO2 98 %., Body mass index is 29.5 kg/m. General:  WF. Moaning and groaning periodically secondary to pain, muscle spasms. Neck: Supple. No thyromegaly. No lymphadenopathy. Lungs: Clear bilaterally to auscultation without wheezes, rales, or rhonchi. Breathing is unlabored. Heart: Regular rhythm. No murmurs, rubs, or gallops. Msk:  Severe tenderness with palpation to both sides of the neck. Has very limited range of motion of the neck secondary to pain and muscle spasm. Extremities/Skin: Warm and dry. Neuro: Alert and oriented X 3. Moves all extremities spontaneously. Gait is normal. CNII-XII grossly in tact. Psych:  Responds to questions appropriately with a normal affect.     ASSESSMENT AND PLAN:  60 y.o. year old female with   1. Renal cell carcinoma, unspecified laterality ([employer]) - DG Chest 2 View; Future - DG Cervical Spine Complete; Future - DG Shoulder Left; Future - DG Shoulder Right; Future  2. Neck pain - DG Cervical Spine Complete; Future  3. Chest pain, unspecified type - DG Chest 2 View; Future  4. Right arm pain - DG Shoulder Right; Future  5. Left arm pain - DG Shoulder Left; Future  Will obtain x-rays and then will follow-up with patient once I get these results. In the interim she can continue to apply heat, gently stretch, and use her  Flexeril and hydrocodone as needed.   [address] [place], [place], [employer] 07/20/2016 2:39 PM

## 2016-07-22 ENCOUNTER — Ambulatory Visit
Admission: RE | Admit: 2016-07-22 | Discharge: 2016-07-22 | Disposition: A | Discharge status: Home / Self Care | Payer: 59 | Source: Ambulatory Visit | Attending: Physician Assistant | Admitting: Physician Assistant

## 2016-07-22 DIAGNOSIS — R079 Chest pain, unspecified: Secondary | ICD-10-CM

## 2016-07-22 DIAGNOSIS — M79601 Pain in right arm: Secondary | ICD-10-CM

## 2016-07-22 DIAGNOSIS — M79602 Pain in left arm: Secondary | ICD-10-CM

## 2016-07-22 DIAGNOSIS — C649 Malignant neoplasm of unspecified kidney, except renal pelvis: Secondary | ICD-10-CM

## 2016-07-22 DIAGNOSIS — M542 Cervicalgia: Secondary | ICD-10-CM

## 2016-08-05 HISTORY — PX: NECK LESION BIOPSY: SHX2078

## 2016-08-09 ENCOUNTER — Other Ambulatory Visit: Payer: Self-pay | Admitting: Family Medicine

## 2016-08-09 DIAGNOSIS — Z1231 Encounter for screening mammogram for malignant neoplasm of breast: Secondary | ICD-10-CM

## 2016-08-22 ENCOUNTER — Ambulatory Visit (INDEPENDENT_AMBULATORY_CARE_PROVIDER_SITE_OTHER): Payer: 59 | Admitting: Cardiovascular Disease

## 2016-08-22 ENCOUNTER — Encounter: Payer: Self-pay | Admitting: Cardiovascular Disease

## 2016-08-22 VITALS — BP 144/80 | HR 104 | Ht 68.0 in | Wt 191.4 lb

## 2016-08-22 DIAGNOSIS — I25118 Atherosclerotic heart disease of native coronary artery with other forms of angina pectoris: Secondary | ICD-10-CM | POA: Diagnosis not present

## 2016-08-22 MED ORDER — METOPROLOL TARTRATE 25 MG PO TABS: 25.0000 mg | ORAL_TABLET | Freq: Two times a day (BID) | ORAL | 11 refills | Status: DC

## 2016-08-22 NOTE — Progress Notes (Signed)
Cardiology Office Note Date:  08/22/2016   ID:  Sheri Hernandez, DOB 1956-08-08, MRN [id number]  PCP:  Odette Fraction, MD  Cardiologist:  Sherren Mocha, MD    Chief Complaint  Patient presents with  . Essential Hypertension   History of Present Illness: Sheri Hernandez is a 60 y.o. female who presents for follow-up evaluation. The patient is followed for coronary artery disease. She presented with non-ST elevation infarction in 2016 and underwent complex PCI complicated by coronary dissection any acute inferior STEMI. She was taken emergently for CABG and underwent saphenous vein graft bypass to the right PDA and PLA branches.  She injured her neck and shoulder in October when she was dancing to [employer]. She's had a lot of problems related to this and underwent an MRI scan recently. She's been stable from a cardiac perspective and has not had any recent chest pain or shortness of breath. She does complain of chronic leg swelling worse on the right leg. She denies orthopnea or PND. She has struggled with depression related to her medical problems.  Past Medical History:  Diagnosis Date  . Anxiety   . Asthma   . Cancer ([place])   . Complication of anesthesia    hard time waking up 20 yrs. ago when having wisdon teeth removed  . Hypertension   . Lung mass    LUNG MASSES--COUGH-BIOPSY NON-DIAGNOSTIC.  HX OF MEDIASTINAL MASS AND LUNG MASSES AGE 23 -NEGATIVE BIOSPY-BUT GIVEN DIAGNOSIS OF SARCOIDOSIS.  WORK UP CONTINUING ON DIAGNOSIS FOR PT'S LUNG MASSES--SHE HAS BIOPSY PROVEN KIDNEY CANCER  . 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  . Papilloma of breast 2012   left. lumpectomy  . S/p nephrectomy    left, due to metastatic RCC, 3/14  . Shortness of breath    WITH EXERTION    Past  Surgical History:  Procedure Laterality Date  . BREAST BIOPSY  08/18/2011   Procedure: BREAST BIOPSY WITH NEEDLE LOCALIZATION;  Surgeon: Merrie Roof, MD;  Location: [facility];  Service: General;  Laterality: Left;  left breast biopsy with needle localization  . BRONCHIAL BRUSH BIOPSY    . CORONARY ARTERY BYPASS GRAFT N/A 01/02/2015   Procedure: CORONARY ARTERY BYPASS GRAFTING (CABG)TIMES TWO USING RIGHT GREATER SAPHENOUS LEG VEIN HARVESTED ENDOSCOPICALLY;  Surgeon: [name], MD;  Location: [facility];  Service: Open Heart Surgery;  Laterality: N/A;  . LAPAROSCOPIC NEPHRECTOMY Left 11/22/2012   Procedure: LAPAROSCOPIC NEPHRECTOMY;  Surgeon: Dutch Gray, MD;  Location: [facility];  Service: Urology;  Laterality: Left;  . LEFT HEART CATHETERIZATION WITH CORONARY ANGIOGRAM N/A 01/02/2015   Procedure: LEFT HEART CATHETERIZATION WITH CORONARY ANGIOGRAM;  Surgeon: Leonie Man, MD;  Location: [facility];  Service: Cardiovascular;  Laterality: N/A;  . NECK LESION BIOPSY    . TONSILLECTOMY  1967  . VIDEO BRONCHOSCOPY  10/11/2012   Procedure: VIDEO BRONCHOSCOPY WITH FLUORO;  Surgeon: Kathee Delton, MD;  Location: [facility];  Service: Cardiopulmonary;  Laterality: Bilateral;  . WIDS    . WISDOM TOOTH EXTRACTION      Current Outpatient Prescriptions  Medication Sig Dispense Refill  . amLODipine (NORVASC) 5 MG tablet Take 2.5 mg by mouth daily.    Marland Kitchen aspirin 81 MG EC tablet Take  1 tablet (81 mg total) by mouth daily.    Marland Kitchen atorvastatin (LIPITOR) 40 MG tablet TAKE 1 TABLET (40 MG TOTAL) BY MOUTH DAILY. 90 tablet 0  . cyclobenzaprine (FLEXERIL) 10 MG tablet Take 1 tablet (10 mg total) by mouth 3 (three) times daily as needed for muscle spasms. 30 tablet 1  . furosemide (LASIX) 20 MG tablet Take 1 tablet (20 mg total) by mouth daily. 30 tablet 3  . HYDROcodone-acetaminophen (NORCO/VICODIN) 5-325 MG tablet Take 1 tablet by mouth every 4 (four) hours as needed for moderate pain. 20 tablet 0  . levothyroxine  (SYNTHROID, LEVOTHROID) 137 MCG tablet TAKE 1 TABLET (137 MCG TOTAL) BY MOUTH DAILY BEFORE BREAKFAST. 30 tablet 5  . LORazepam (ATIVAN) 1 MG tablet Take 1 mg by mouth every 8 (eight) hours as needed for anxiety.     . metoprolol tartrate (LOPRESSOR) 25 MG tablet TAKE 1/2 TABLET (12.5 MG TOTAL) BY MOUTH 2 (TWO) TIMES DAILY. 30 tablet 11  . nitroGLYCERIN (NITROSTAT) 0.4 MG SL tablet Place 1 tablet (0.4 mg total) under the tongue every 5 (five) minutes as needed for chest pain. 25 tablet 1   No current facility-administered medications for this visit.     Allergies:   Patient has no known allergies.   Social History:  The patient  reports that she quit smoking about 3 years ago. Her smoking use included Cigarettes. She has a 60.00 pack-year smoking history. She has never used smokeless tobacco. She reports that she does not drink alcohol or use drugs.   Family History:  The patient's  family history includes Breast cancer in her maternal aunt; Cancer in her maternal grandfather; Colon cancer in her maternal aunt; Heart attack in her father; Heart disease in her brother, father, maternal grandmother, and mother; Skin cancer in her maternal uncle; Stomach cancer in her maternal uncle; Stroke in her brother.    ROS:  Please see the history of present illness.  Otherwise, review of systems is positive for Shoulder and neck pain, shortness of breath with exertion, depression.  All other systems are reviewed and negative.    PHYSICAL EXAM: VS:  BP (!) 144/80   Pulse (!) 104   Ht '5\' 8"'$  (1.727 m)   Wt 191 lb 6.4 oz (86.8 kg)   BMI 29.10 kg/m  , BMI Body mass index is 29.1 kg/m. GEN: Well nourished, well developed, in no acute distress  HEENT: normal  Neck: no JVD, no masses. No carotid bruits Cardiac: RRR without murmur or gallop                Respiratory:  clear to auscultation bilaterally, normal work of breathing GI: soft, nontender, nondistended, + BS MS: no deformity or atrophy  Ext: 2+  edema on the right and trace pretibial edema on the left, pedal pulses 2+= bilaterally Skin: warm and dry, no rash Neuro:  Strength and sensation are intact Psych: euthymic mood, full affect  EKG:  EKG is ordered today. The ekg ordered today shows Sinus tachycardia 104 bpm, age indeterminate inferior infarct, otherwise within normal limits.  Recent Labs: No results found for requested labs within last 8760 hours.   Lipid Panel     Component Value Date/Time   CHOL 90 01/03/2015 0210   TRIG 77 01/03/2015 0210   HDL 15 (L) 01/03/2015 0210   CHOLHDL 6.0 01/03/2015 0210   VLDL 15 01/03/2015 0210   LDLCALC 60 01/03/2015 0210      Wt Readings from Last  3 Encounters:  08/22/16 191 lb 6.4 oz (86.8 kg)  07/20/16 194 lb (88 kg)  05/02/16 198 lb (89.8 kg)     Cardiac Studies Reviewed: Echocardiogram 2016/03/07: Study Conclusions  - Left ventricle: The cavity size was normal. Systolic function was   normal. The estimated ejection fraction was in the range of 55%   to 60%. Wall motion was normal; there were no regional wall   motion abnormalities. Doppler parameters are consistent with   abnormal left ventricular relaxation (grade 1 diastolic   dysfunction). - Atrial septum: No defect or patent foramen ovale was identified.   ASSESSMENT AND PLAN: 1.  CAD, native vessel, with angina: Essentially symptom-free on a combination of metoprolol and amlodipine. She will continue low-dose aspirin and atorvastatin. I will see her back in 6 months.  2. Essential hypertension: Blood pressure above goal. I think the patient will do better on the little higher dose of metoprolol 25 mg twice daily.  3. Hyperlipidemia: Treated with atorvastatin 40 mg. Most recent lipids reviewed as above  Current medicines are reviewed with the patient today.  The patient does not have concerns regarding medicines.  Labs/ tests ordered today include:  No orders of the defined types were placed in this  encounter.  Disposition:   FU 6 months  Signed, Sherren Mocha, MD  08/22/2016 1:42 PM    [facility] [place], [place], [place]  [address] Phone: [phone]; Fax: [phone]

## 2016-08-22 NOTE — Patient Instructions (Signed)
Medication Instructions:  Your physician has recommended you make the following change in your medication:  1. INCREASE Metoprolol Tartrate to '25mg'$  take one tablet by mouth twice a day  Labwork: No new orders.   Testing/Procedures: No new orders.   Follow-Up: Your physician wants you to follow-up in: 6 MONTHS with Dr Burt Knack.  You will receive a reminder letter in the mail two months in advance. If you don't receive a letter, please call our office to schedule the follow-up appointment.   Any Other Special Instructions Will Be Listed Below (If Applicable).  Order given for knee high compression stockings, 20-56mHg.     If you need a refill on your cardiac medications before your next appointment, please call your pharmacy.

## 2016-08-30 ENCOUNTER — Ambulatory Visit: Payer: 59

## 2016-09-05 DIAGNOSIS — R7881 Bacteremia: Secondary | ICD-10-CM | POA: Diagnosis not present

## 2016-09-06 DIAGNOSIS — D098 Carcinoma in situ of other specified sites: Secondary | ICD-10-CM | POA: Diagnosis not present

## 2016-09-06 DIAGNOSIS — R7881 Bacteremia: Secondary | ICD-10-CM | POA: Diagnosis not present

## 2016-09-08 DIAGNOSIS — K760 Fatty (change of) liver, not elsewhere classified: Secondary | ICD-10-CM | POA: Diagnosis not present

## 2016-09-08 DIAGNOSIS — C641 Malignant neoplasm of right kidney, except renal pelvis: Secondary | ICD-10-CM | POA: Diagnosis not present

## 2016-09-08 DIAGNOSIS — Z905 Acquired absence of kidney: Secondary | ICD-10-CM | POA: Diagnosis not present

## 2016-09-08 DIAGNOSIS — R7881 Bacteremia: Secondary | ICD-10-CM | POA: Diagnosis not present

## 2016-09-08 DIAGNOSIS — R591 Generalized enlarged lymph nodes: Secondary | ICD-10-CM | POA: Diagnosis not present

## 2016-09-08 DIAGNOSIS — R918 Other nonspecific abnormal finding of lung field: Secondary | ICD-10-CM | POA: Diagnosis not present

## 2016-09-08 DIAGNOSIS — C642 Malignant neoplasm of left kidney, except renal pelvis: Secondary | ICD-10-CM | POA: Diagnosis not present

## 2016-09-09 DIAGNOSIS — R7881 Bacteremia: Secondary | ICD-10-CM | POA: Diagnosis not present

## 2016-09-10 DIAGNOSIS — R7881 Bacteremia: Secondary | ICD-10-CM | POA: Diagnosis not present

## 2016-09-12 DIAGNOSIS — R7881 Bacteremia: Secondary | ICD-10-CM | POA: Diagnosis not present

## 2016-09-19 ENCOUNTER — Encounter: Payer: Self-pay | Admitting: Family Medicine

## 2016-09-27 DIAGNOSIS — M4642 Discitis, unspecified, cervical region: Secondary | ICD-10-CM | POA: Insufficient documentation

## 2016-09-27 DIAGNOSIS — Z5112 Encounter for antineoplastic immunotherapy: Secondary | ICD-10-CM | POA: Diagnosis not present

## 2016-09-27 DIAGNOSIS — C642 Malignant neoplasm of left kidney, except renal pelvis: Secondary | ICD-10-CM | POA: Diagnosis not present

## 2016-09-28 ENCOUNTER — Telehealth: Payer: Self-pay | Admitting: Cardiovascular Disease

## 2016-09-28 NOTE — Telephone Encounter (Signed)
New Message   Pt c/o of Chest Pain: STAT if CP now or developed within 24 hours  1. Are you having CP right now? No  2. Are you experiencing any other symptoms (ex. SOB, nausea, vomiting, sweating)? So diarrhea  3. How long have you been experiencing CP? About a week  4. Is your CP continuous or coming and going? Coming and going. Experienced after walking dog  5. Have you taken Nitroglycerin? No ? Visit scheduled for 2/2 with Orbie Pyo a call back

## 2016-09-28 NOTE — Telephone Encounter (Signed)
Discussed with Dr Burt Knack and the pt will be added to clinic schedule tomorrow. I contacted the pt's husband and made him aware of appointment. I advised that the pt should proceed to the ER if she has any additional symptoms prior to appointment. Pt's spouse agreed with plan.

## 2016-09-28 NOTE — Telephone Encounter (Signed)
I spoke with the pt and she complains of diarrhea 4-5 times per day for the past week.  The pt spoke with provider at [facility] yesterday and they recommended she take a probiotic. The pt also complains of episodes of CP that started 2 weeks ago but over the past week they have worsened in intensity and frequency. The pt took NTG 2 weeks ago with CP and had relief.  Today the pt experienced CP that radiated into her throat and neck that lasted 1-2 minutes after walking her dog.  The pt took a hydrocodone and her pain resolved.  The pt denied nausea with CP but did have dizziness. The pt was also awakened from her sleep this morning with CP after having a "crazy dream". The pt states her symptoms are similar to what she felt with her MI. I advised the pt that I will speak with Dr Burt Knack and call her back. She currently has an appointment scheduled 10/07/2016. (additional contact [phone], pt's spouse)

## 2016-09-28 NOTE — Telephone Encounter (Signed)
Left message on machine for pt to contact the office to discuss her symptoms.

## 2016-09-29 ENCOUNTER — Encounter: Payer: Self-pay | Admitting: Cardiovascular Disease

## 2016-09-29 ENCOUNTER — Ambulatory Visit (INDEPENDENT_AMBULATORY_CARE_PROVIDER_SITE_OTHER): Payer: 59 | Admitting: Cardiovascular Disease

## 2016-09-29 VITALS — BP 110/70 | HR 64 | Ht 67.0 in | Wt 189.8 lb

## 2016-09-29 DIAGNOSIS — E785 Hyperlipidemia, unspecified: Secondary | ICD-10-CM

## 2016-09-29 DIAGNOSIS — I25708 Atherosclerosis of coronary artery bypass graft(s), unspecified, with other forms of angina pectoris: Secondary | ICD-10-CM

## 2016-09-29 MED ORDER — ISOSORBIDE MONONITRATE ER 30 MG PO TB24: 30.0000 mg | ORAL_TABLET | Freq: Every day | ORAL | 3 refills | Status: DC

## 2016-09-29 MED ORDER — NITROGLYCERIN 0.4 MG SL SUBL: 0.4000 mg | SUBLINGUAL_TABLET | SUBLINGUAL | 1 refills | Status: DC | PRN

## 2016-09-29 MED ORDER — FUROSEMIDE 20 MG PO TABS: 20.0000 mg | ORAL_TABLET | Freq: Every day | ORAL | 11 refills | Status: DC

## 2016-09-29 NOTE — Patient Instructions (Signed)
Medication Instructions:  Your physician has recommended you make the following change in your medication:  1. START Isosorbide MN '30mg'$  take one-half tablet by mouth daily, if you tolerate this dosage then you can increase to '30mg'$  daily after 1-2 WEEKS 2. RESTART Aspirin '81mg'$  take one tablet by mouth daily  Labwork: No new orders.   Testing/Procedures: No new orders.   Follow-Up: Your physician recommends that you schedule a follow-up appointment in: 4-6 WEEKS with Richardson Dopp PA-C   Any Other Special Instructions Will Be Listed Below (If Applicable).     If you need a refill on your cardiac medications before your next appointment, please call your pharmacy.

## 2016-09-29 NOTE — Progress Notes (Signed)
Cardiology Office Note Date:  10/03/2016   ID:  Sheri Hernandez, DOB December 26, 1955, MRN [id number]  PCP:  Odette Fraction, MD  Cardiologist:  Sherren Mocha, MD    Chief Complaint  Patient presents with  . Chest Pain  . Dizziness     History of Present Illness: Sheri Hernandez is a 61 y.o. female who presents for evaluation of chest pain. The patient is followed for coronary artery disease. She presented with non-ST elevation infarction in 2016 and underwent complex PCI complicated by coronary dissection any acute inferior STEMI. She was taken emergently for CABG and underwent saphenous vein graft bypass to the right PDA and PLA branches.  The patient was just seen last month for routine follow-up.She called in with chest pain and is added onto our schedule today. The patient called in with her husband today.  She describes a burning sensation and pressure from the center of her chest up to both shoulders. There are some characteristics that remind her of her chest pain leading up to PCI and bypass surgery. However, her symptoms now are not particularly related to physical activity. She is still able to walk up a flight of stairs, clean dishes, and do other light activities without associated angina. Her symptoms have occurred at rest. They last 1-2 minutes and resolve on their own. She had an episode the other day began immediately as her dog pulled away from her on his leash. Leg swelling is unchanged. There is no shortness of breath. She denies any other associated symptoms.   Past Medical History:  Diagnosis Date  . Anxiety   . Asthma   . Cancer [facility])    metastatic renal cell carcinoma (biopsy of left renal mass 10/2012) On nivolumab at [facility]  . Complication of anesthesia    hard time waking up 20 yrs. ago when having wisdon teeth removed  . Hypertension   . Lung mass    LUNG MASSES--COUGH-BIOPSY NON-DIAGNOSTIC.  HX OF MEDIASTINAL MASS AND LUNG MASSES AGE 45 -NEGATIVE  BIOSPY-BUT GIVEN DIAGNOSIS OF SARCOIDOSIS.  WORK UP CONTINUING ON DIAGNOSIS FOR PT'S LUNG MASSES--SHE HAS BIOPSY PROVEN KIDNEY CANCER  . 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  . Papilloma of breast 2012   left. lumpectomy  . S/p nephrectomy    left, due to metastatic RCC, 3/14  . Shortness of breath    WITH EXERTION    Past Surgical History:  Procedure Laterality Date  . BREAST BIOPSY  08/18/2011   Procedure: BREAST BIOPSY WITH NEEDLE LOCALIZATION;  Surgeon: Merrie Roof, MD;  Location: [facility];  Service: General;  Laterality: Left;  left breast biopsy with needle localization  . BRONCHIAL BRUSH BIOPSY    . CORONARY ARTERY BYPASS GRAFT N/A 01/02/2015   Procedure: CORONARY ARTERY BYPASS GRAFTING (CABG)TIMES TWO USING RIGHT GREATER SAPHENOUS LEG VEIN HARVESTED ENDOSCOPICALLY;  Surgeon: [name], MD;  Location: [facility];  Service: Open Heart Surgery;  Laterality: N/A;  . LAPAROSCOPIC NEPHRECTOMY Left 11/22/2012   Procedure: LAPAROSCOPIC NEPHRECTOMY;  Surgeon: Dutch Gray, MD;  Location: [facility];  Service: Urology;  Laterality: Left;  . LEFT HEART CATHETERIZATION WITH CORONARY ANGIOGRAM N/A 01/02/2015   Procedure: LEFT HEART CATHETERIZATION WITH CORONARY ANGIOGRAM;  Surgeon: Leonie Man, MD;  Location: [facility];  Service: Cardiovascular;  Laterality: N/A;  . NECK LESION BIOPSY    . TONSILLECTOMY  1967  . VIDEO BRONCHOSCOPY  10/11/2012   Procedure: VIDEO BRONCHOSCOPY WITH FLUORO;  Surgeon: Kathee Delton, MD;  Location: [facility];  Service: Cardiopulmonary;  Laterality: Bilateral;  . WIDS    . WISDOM TOOTH EXTRACTION      Current Outpatient Prescriptions  Medication Sig Dispense Refill  . amLODipine (NORVASC) 5 MG tablet Take 2.5 mg by mouth daily.    Marland Kitchen aspirin 81 MG EC tablet  Take 1 tablet (81 mg total) by mouth daily.    Marland Kitchen atorvastatin (LIPITOR) 40 MG tablet TAKE 1 TABLET (40 MG TOTAL) BY MOUTH DAILY. 90 tablet 0  . cyclobenzaprine (FLEXERIL) 10 MG tablet Take 1 tablet (10 mg total) by mouth 3 (three) times daily as needed for muscle spasms. 30 tablet 1  . docusate sodium (COLACE) 100 MG capsule Take 100 mg by mouth 2 (two) times daily as needed (constipation).     . furosemide (LASIX) 20 MG tablet Take 1 tablet (20 mg total) by mouth daily. 30 tablet 11  . HYDROcodone-acetaminophen (NORCO/VICODIN) 5-325 MG tablet Take 1 tablet by mouth every 4 (four) hours as needed for moderate pain. 20 tablet 0  . levothyroxine (SYNTHROID, LEVOTHROID) 137 MCG tablet TAKE 1 TABLET (137 MCG TOTAL) BY MOUTH DAILY BEFORE BREAKFAST. 30 tablet 5  . lidocaine-prilocaine (EMLA) cream Apply 1 application topically as directed. Apply cream to portacath site approximately 1 hour before access and cover  3  . LORazepam (ATIVAN) 1 MG tablet Take 1 mg by mouth every 8 (eight) hours as needed for anxiety.     . metoprolol tartrate (LOPRESSOR) 25 MG tablet Take 1 tablet (25 mg total) by mouth 2 (two) times daily. 60 tablet 11  . nitroGLYCERIN (NITROSTAT) 0.4 MG SL tablet Place 1 tablet (0.4 mg total) under the tongue every 5 (five) minutes as needed for chest pain. 25 tablet 1  . oxycodone (OXY-IR) 5 MG capsule Take 5 mg by mouth every 6 (six) hours as needed. pain    . prochlorperazine (COMPAZINE) 10 MG tablet Take 10 mg by mouth every 6 (six) hours as needed. for nausea  3  . senna-docusate (SENOKOT-S) 8.6-50 MG tablet Take 1 tablet by mouth 2 (two) times daily as needed. Constipation    . isosorbide mononitrate (IMDUR) 30 MG 24 hr tablet Take 1 tablet (30 mg total) by mouth daily. 30 tablet 3   No current facility-administered medications for this visit.     Allergies:   Silver   Social History:  The patient  reports that she quit smoking about 4 years ago. Her smoking use included  Cigarettes. She has a 60.00 pack-year smoking history. She has never used smokeless tobacco. She reports that she does not drink alcohol or use drugs.   Family History:  The patient's family history includes Breast cancer in her maternal aunt; Cancer in her maternal grandfather; Colon cancer in her maternal aunt; Heart attack in her father; Heart disease in her brother, father, maternal grandmother, and mother; Skin cancer in her maternal uncle; Stomach cancer in her maternal uncle; Stroke in her brother.    ROS:  Please see the history of present illness.  Otherwise, review of systems is positive for chest pain, leg swelling, diarrhea, back pain, muscle pain, dizziness, anxiety.  All other systems are reviewed and negative.    PHYSICAL EXAM: VS:  BP 110/70  Pulse 64   Ht '5\' 7"'$  (1.702 m)   Wt 189 lb 12.8 oz (86.1 kg)   BMI 29.73 kg/m  , BMI Body mass index is 29.73 kg/m. GEN: Well nourished, well developed, in no acute distress  HEENT: normal  Neck: no JVD, no masses. No carotid bruits Cardiac: RRR without murmur or gallop                Respiratory:  clear to auscultation bilaterally, normal work of breathing GI: soft, nontender, nondistended, + BS MS: no deformity or atrophy  Ext: 1+ bilateral pretibial edema, pedal pulses 2+= bilaterally Skin: warm and dry, no rash Neuro:  Strength and sensation are intact Psych: euthymic mood, full affect  EKG:  EKG is ordered today. The ekg ordered today shows normal sinus rhythm 65 bpm, possible age-indeterminate inferior infarct, no significant change from previous tracings.  Recent Labs: No results found for requested labs within last 8760 hours.   Lipid Panel     Component Value Date/Time   CHOL 90 01/03/2015 0210   TRIG 77 01/03/2015 0210   HDL 15 (L) 01/03/2015 0210   CHOLHDL 6.0 01/03/2015 0210   VLDL 15 01/03/2015 0210   LDLCALC 60 01/03/2015 0210      Wt Readings from Last 3 Encounters:  09/29/16 189 lb 12.8 oz (86.1 kg)    08/22/16 191 lb 6.4 oz (86.8 kg)  07/20/16 194 lb (88 kg)    ASSESSMENT AND PLAN: Coronary artery disease, native vessel, with symptoms of angina: The patient has both typical and atypical features of her angina. We discussed potential diagnostic options at length. We reviewed options of medical therapy, noninvasive stress testing, and definitive evaluation with cardiac catheterization and possible PCI. She is very clear that she does not wish to proceed with stress testing or cardiac catheterization at this time. I think it is reasonable to add isosorbide 15 mg titrated up to 30 mg as tolerated. She will continue on amlodipine and metoprolol. I asked her to resume aspirin 81 mg daily which she has not been taking regularly. We will call her next week to touch base and see how her symptoms have responded. I would like her to return in 4-6 weeks for follow-up unless symptoms progress. She is counseled about seeking immediate medical attention if resting chest pain arises that does not respond to nitroglycerin or last more than a few minutes.  Current medicines are reviewed with the patient today.  The patient does not have concerns regarding medicines.  Labs/ tests ordered today include:   Orders Placed This Encounter  Procedures  . EKG 12-Lead    Disposition:   FU 4-6 weeks with APP  Signed, Sherren Mocha, MD  10/03/2016 5:52 PM    [facility] [place], [place], [place]  [address] Phone: [phone]; Fax: [phone]

## 2016-09-30 ENCOUNTER — Telehealth: Payer: Self-pay | Admitting: Cardiovascular Disease

## 2016-09-30 NOTE — Telephone Encounter (Signed)
° °  Pt has a question regarding her isosorbide mononitrate (IMDUR) 30 MG 24 hr tablet medication. Please call.

## 2016-09-30 NOTE — Telephone Encounter (Signed)
Called, unable to reach pt. Left voice message. Requested call back.

## 2016-09-30 NOTE — Telephone Encounter (Signed)
Received incoming call. Pt stated she had a headache for several hours in the early AM, but it resolved on its own. Pt is feeling better now. Pt stated her head is not hurting now, and she denies CP. Informed pt to continue on Imdur 15 mg (1/2 tab) as long as pt can tolerate med. I will forward to Dr. Burt Knack and his nurse Theodosia Quay, RN.

## 2016-09-30 NOTE — Telephone Encounter (Signed)
New Message   Pt c/o medication issue:  1. Name of Medication:   isosorbide mononitrate (IMDUR) 30 MG 24 hr tablet    2. How are you currently taking this medication (dosage and times per day)? Pt took half yesterday  3. Are you having a reaction (difficulty breathing--STAT)? Headache started at 4am  4. What is your medication issue? Pt was told she could potentially have headaches, and she is. Wants to know if she should continue or stop medication. Requesting a call back

## 2016-10-03 NOTE — Telephone Encounter (Signed)
Agree with plan. thanks

## 2016-10-04 ENCOUNTER — Other Ambulatory Visit: Payer: Self-pay | Admitting: Cardiovascular Disease

## 2016-10-04 ENCOUNTER — Telehealth: Payer: Self-pay | Admitting: Cardiovascular Disease

## 2016-10-04 DIAGNOSIS — R002 Palpitations: Secondary | ICD-10-CM

## 2016-10-04 DIAGNOSIS — R609 Edema, unspecified: Secondary | ICD-10-CM

## 2016-10-04 DIAGNOSIS — I2510 Atherosclerotic heart disease of native coronary artery without angina pectoris: Secondary | ICD-10-CM

## 2016-10-04 NOTE — Telephone Encounter (Signed)
I spoke with the pt and she is currently having pain in her left arm and arm pit while talking on the phone but it is subsiding. She has been taking the Isosorbide MN '15mg'$  daily and noticed a big improvement in her symptoms.  Previously she was having multiple episodes per day but she is now having either one or no episodes per day.  The pt has noticed a headache around 5 hours after taking Isosorbide MN but she takes Hydrocodone and her HA resolves. I advised the pt that she can increase to '30mg'$  daily if she would like to try this dosage but she wants to remain on '15mg'$  for the next few days.  The pt will touch base with the office again Friday to give an update.

## 2016-10-04 NOTE — Telephone Encounter (Signed)
Ok agree with plan

## 2016-10-04 NOTE — Telephone Encounter (Signed)
Follow Up:    Pt said she was supposed to give you a follow up call on how she was doing with her medicine.

## 2016-10-05 NOTE — Telephone Encounter (Signed)
Rx refill sent to pharmacy. 

## 2016-10-07 ENCOUNTER — Ambulatory Visit: Payer: 59 | Admitting: Physician Assistant

## 2016-10-08 ENCOUNTER — Encounter (HOSPITAL_COMMUNITY): Payer: Self-pay | Admitting: *Deleted

## 2016-10-08 ENCOUNTER — Emergency Department (HOSPITAL_COMMUNITY): Payer: 59

## 2016-10-08 ENCOUNTER — Observation Stay (HOSPITAL_COMMUNITY): Payer: 59

## 2016-10-08 ENCOUNTER — Observation Stay (HOSPITAL_COMMUNITY)
Admission: EM | Admit: 2016-10-08 | Discharge: 2016-10-12 | Disposition: A | Discharge status: Home / Self Care | Payer: 59 | Attending: Internal Medicine | Admitting: Internal Medicine

## 2016-10-08 DIAGNOSIS — I517 Cardiomegaly: Secondary | ICD-10-CM | POA: Diagnosis not present

## 2016-10-08 DIAGNOSIS — I252 Old myocardial infarction: Secondary | ICD-10-CM | POA: Insufficient documentation

## 2016-10-08 DIAGNOSIS — K5900 Constipation, unspecified: Secondary | ICD-10-CM | POA: Diagnosis not present

## 2016-10-08 DIAGNOSIS — I25118 Atherosclerotic heart disease of native coronary artery with other forms of angina pectoris: Secondary | ICD-10-CM

## 2016-10-08 DIAGNOSIS — J45909 Unspecified asthma, uncomplicated: Secondary | ICD-10-CM | POA: Diagnosis not present

## 2016-10-08 DIAGNOSIS — I2510 Atherosclerotic heart disease of native coronary artery without angina pectoris: Secondary | ICD-10-CM

## 2016-10-08 DIAGNOSIS — Z808 Family history of malignant neoplasm of other organs or systems: Secondary | ICD-10-CM | POA: Insufficient documentation

## 2016-10-08 DIAGNOSIS — I10 Essential (primary) hypertension: Secondary | ICD-10-CM | POA: Diagnosis present

## 2016-10-08 DIAGNOSIS — R0789 Other chest pain: Secondary | ICD-10-CM | POA: Diagnosis not present

## 2016-10-08 DIAGNOSIS — I340 Nonrheumatic mitral (valve) insufficiency: Secondary | ICD-10-CM | POA: Insufficient documentation

## 2016-10-08 DIAGNOSIS — Z87891 Personal history of nicotine dependence: Secondary | ICD-10-CM | POA: Insufficient documentation

## 2016-10-08 DIAGNOSIS — Z8249 Family history of ischemic heart disease and other diseases of the circulatory system: Secondary | ICD-10-CM | POA: Diagnosis not present

## 2016-10-08 DIAGNOSIS — I700 Atherosclerosis of aorta: Secondary | ICD-10-CM | POA: Diagnosis not present

## 2016-10-08 DIAGNOSIS — I493 Ventricular premature depolarization: Secondary | ICD-10-CM | POA: Diagnosis not present

## 2016-10-08 DIAGNOSIS — I25700 Atherosclerosis of coronary artery bypass graft(s), unspecified, with unstable angina pectoris: Secondary | ICD-10-CM | POA: Diagnosis not present

## 2016-10-08 DIAGNOSIS — Z91048 Other nonmedicinal substance allergy status: Secondary | ICD-10-CM | POA: Diagnosis not present

## 2016-10-08 DIAGNOSIS — Z79899 Other long term (current) drug therapy: Secondary | ICD-10-CM | POA: Diagnosis not present

## 2016-10-08 DIAGNOSIS — Z7982 Long term (current) use of aspirin: Secondary | ICD-10-CM | POA: Insufficient documentation

## 2016-10-08 DIAGNOSIS — Z905 Acquired absence of kidney: Secondary | ICD-10-CM | POA: Insufficient documentation

## 2016-10-08 DIAGNOSIS — C649 Malignant neoplasm of unspecified kidney, except renal pelvis: Secondary | ICD-10-CM | POA: Diagnosis not present

## 2016-10-08 DIAGNOSIS — K219 Gastro-esophageal reflux disease without esophagitis: Secondary | ICD-10-CM | POA: Insufficient documentation

## 2016-10-08 DIAGNOSIS — I25110 Atherosclerotic heart disease of native coronary artery with unstable angina pectoris: Principal | ICD-10-CM | POA: Insufficient documentation

## 2016-10-08 DIAGNOSIS — E032 Hypothyroidism due to medicaments and other exogenous substances: Secondary | ICD-10-CM | POA: Diagnosis not present

## 2016-10-08 DIAGNOSIS — I200 Unstable angina: Secondary | ICD-10-CM

## 2016-10-08 DIAGNOSIS — D869 Sarcoidosis, unspecified: Secondary | ICD-10-CM | POA: Diagnosis not present

## 2016-10-08 DIAGNOSIS — Z7902 Long term (current) use of antithrombotics/antiplatelets: Secondary | ICD-10-CM | POA: Insufficient documentation

## 2016-10-08 DIAGNOSIS — Z951 Presence of aortocoronary bypass graft: Secondary | ICD-10-CM

## 2016-10-08 DIAGNOSIS — Z800 Family history of malignant neoplasm of digestive organs: Secondary | ICD-10-CM | POA: Insufficient documentation

## 2016-10-08 DIAGNOSIS — K760 Fatty (change of) liver, not elsewhere classified: Secondary | ICD-10-CM | POA: Insufficient documentation

## 2016-10-08 DIAGNOSIS — Z823 Family history of stroke: Secondary | ICD-10-CM | POA: Diagnosis not present

## 2016-10-08 DIAGNOSIS — R55 Syncope and collapse: Secondary | ICD-10-CM

## 2016-10-08 DIAGNOSIS — R079 Chest pain, unspecified: Secondary | ICD-10-CM | POA: Diagnosis present

## 2016-10-08 DIAGNOSIS — I208 Other forms of angina pectoris: Secondary | ICD-10-CM

## 2016-10-08 DIAGNOSIS — F419 Anxiety disorder, unspecified: Secondary | ICD-10-CM

## 2016-10-08 HISTORY — DX: Acute myocardial infarction, unspecified: I21.9

## 2016-10-08 HISTORY — DX: Atherosclerotic heart disease of native coronary artery without angina pectoris: I25.10

## 2016-10-08 LAB — CBC
HCT: 37.9 % (ref 36.0–46.0)
Hemoglobin: 12.2 g/dL (ref 12.0–15.0)
MCH: 27.5 pg (ref 26.0–34.0)
MCHC: 32.2 g/dL (ref 30.0–36.0)
MCV: 85.4 fL (ref 78.0–100.0)
Platelets: 222 10*3/uL (ref 150–400)
RBC: 4.44 MIL/uL (ref 3.87–5.11)
RDW: 15 % (ref 11.5–15.5)
WBC: 7.5 10*3/uL (ref 4.0–10.5)

## 2016-10-08 LAB — BASIC METABOLIC PANEL
Anion gap: 15 (ref 5–15)
BUN: 12 mg/dL (ref 6–20)
CHLORIDE: 101 mmol/L (ref 101–111)
CO2: 24 mmol/L (ref 22–32)
CREATININE: 0.86 mg/dL (ref 0.44–1.00)
Calcium: 9.7 mg/dL (ref 8.9–10.3)
GFR calc non Af Amer: >60 mL/min (ref >60)
Glucose, Bld: 125 mg/dL — ABNORMAL HIGH (ref 65–99)
POTASSIUM: 3.6 mmol/L (ref 3.5–5.1)
SODIUM: 140 mmol/L (ref 135–145)

## 2016-10-08 LAB — TROPONIN I: Troponin I: <0.03 ng/mL

## 2016-10-08 MED ORDER — IOPAMIDOL (ISOVUE-370) INJECTION 76%
100.0000 mL | Freq: Once | INTRAVENOUS | Status: AC | PRN
  Administered 2016-10-08: 100 mL via INTRAVENOUS

## 2016-10-08 NOTE — ED Notes (Signed)
Pt in xray

## 2016-10-08 NOTE — ED Notes (Signed)
Report called to  rn on 2

## 2016-10-08 NOTE — ED Provider Notes (Signed)
[facility] DEPT Provider Note   CSN: [id number] Arrival date & time: 10/08/16  1804     History   Chief Complaint Chief Complaint  Patient presents with  . Chest Pain    HPI Sheri Hernandez is a 61 y.o. female.  Patient is a 61 year old female with multiple medical problems including metastatic renal cancer currently getting intermittent chemotherapy infusions, coronary artery disease status post double bypass less than 1 year ago who gets intermittent chest pain currently on Imdur presenting today with 2 episodes of near syncope and chest pain. Patient does admit to being hospitalized early this seizure had [facility] for a line infection requiring IV vancomycin but she has finished that. She has no prior history of blood clots and is currently not anticoagulated. Since states today she was fine this morning and then when she was out shopping she had a strange sensation. She states she did not feel herself she felt slightly confused and then felt very weak causing her knees to buckle. She did not lose consciousness but felt lightheaded. She also had some mild discomfort in her chest. This resolved after 5 or 10 minutes and she went home. She took hydrocodone and oxycodone and her pain resolved. She then was sitting on the couch and she began to have the strange sensation a second time. She got up to walk to the kitchen and started feeling a week again with return of pain in her chest. She then took a nitroglycerin with complete resolution of the pain. She states the pain radiated into her neck and shoulders today but she denied shortness of breath but felt clammy. She denies any recent cough or congestion. No fevers, nausea or vomiting. She denies any abdominal pain. She does note some left arm pain yesterday which she has not had today. The left arm pain occurred while she was walking the dog. She states she was having pain like that more frequently but it has improved since she started  this 15 mg of Imdur   The history is provided by the patient and the spouse.  Chest Pain      Past Medical History:  Diagnosis Date  . Anxiety   . Asthma   . Cancer [facility])    metastatic renal cell carcinoma (biopsy of left renal mass 10/2012) On nivolumab at [facility]  . Complication of anesthesia    hard time waking up 20 yrs. ago when having [place] teeth removed  . Hypertension   . Lung mass    LUNG MASSES--COUGH-BIOPSY NON-DIAGNOSTIC.  HX OF MEDIASTINAL MASS AND LUNG MASSES AGE 35 -NEGATIVE BIOSPY-BUT GIVEN DIAGNOSIS OF SARCOIDOSIS.  WORK UP CONTINUING ON DIAGNOSIS FOR PT'S LUNG MASSES--SHE HAS BIOPSY PROVEN KIDNEY CANCER  . 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  . Papilloma of breast 2012   left. lumpectomy  . S/p nephrectomy    left, due to metastatic RCC, 3/14  . Shortness of breath    WITH EXERTION    Patient Active Problem List   Diagnosis Date Noted  . Essential hypertension 06/01/2016  . GERD (gastroesophageal reflux disease) 03/18/2015  . Acute blood loss anemia 01/12/2015  . Hyperglycemia 01/12/2015  . NSTEMI (non-ST elevated myocardial infarction) ([place])   . S/P CABG x 2 01/03/2015  .  ACS (acute coronary syndrome) ([place]) 01/02/2015  . Drug-induced hypothyroidism 03/13/2014  . S/p nephrectomy   . Adenocarcinoma, renal cell ([place]) 11/12/2012  . Lung mass 10/01/2012  . Renal mass 10/01/2012  . Mass of left breast 08/01/2011    Past Surgical History:  Procedure Laterality Date  . BREAST BIOPSY  08/18/2011   Procedure: BREAST BIOPSY WITH NEEDLE LOCALIZATION;  Surgeon: Merrie Roof, MD;  Location: [facility];  Service: General;  Laterality: Left;  left breast biopsy with needle localization  . BRONCHIAL BRUSH BIOPSY    . CORONARY ARTERY BYPASS GRAFT N/A 01/02/2015    Procedure: CORONARY ARTERY BYPASS GRAFTING (CABG)TIMES TWO USING RIGHT GREATER SAPHENOUS LEG VEIN HARVESTED ENDOSCOPICALLY;  Surgeon: [name], MD;  Location: [place];  Service: Open Heart Surgery;  Laterality: N/A;  . LAPAROSCOPIC NEPHRECTOMY Left 11/22/2012   Procedure: LAPAROSCOPIC NEPHRECTOMY;  Surgeon: Dutch Gray, MD;  Location: [facility];  Service: Urology;  Laterality: Left;  . LEFT HEART CATHETERIZATION WITH CORONARY ANGIOGRAM N/A 01/02/2015   Procedure: LEFT HEART CATHETERIZATION WITH CORONARY ANGIOGRAM;  Surgeon: Leonie Man, MD;  Location: [facility] CATH LAB;  Service: Cardiovascular;  Laterality: N/A;  . NECK LESION BIOPSY    . TONSILLECTOMY  1967  . VIDEO BRONCHOSCOPY  10/11/2012   Procedure: VIDEO BRONCHOSCOPY WITH FLUORO;  Surgeon: Kathee Delton, MD;  Location: [facility];  Service: Cardiopulmonary;  Laterality: Bilateral;  . WIDS    . WISDOM TOOTH EXTRACTION      OB History    Gravida Para Term Preterm AB Living   0 0 0 0 0 0   SAB TAB Ectopic Multiple Live Births   0 0 0 0         Home Medications    Prior to Admission medications   Medication Sig Start Date End Date Taking? Authorizing Provider  amLODipine (NORVASC) 5 MG tablet Take 2.5 mg by mouth daily.   Yes Historical Provider, MD  aspirin 81 MG EC tablet Take 1 tablet (81 mg total) by mouth daily. 02/09/16  Yes Sherren Mocha, MD  atorvastatin (LIPITOR) 40 MG tablet TAKE 1 TABLET (40 MG TOTAL) BY MOUTH DAILY. 10/05/16  Yes Sherren Mocha, MD  cyclobenzaprine (FLEXERIL) 10 MG tablet Take 1 tablet (10 mg total) by mouth 3 (three) times daily as needed for muscle spasms. 07/06/16  Yes Mary B Dixon, PA-C  docusate sodium (COLACE) 100 MG capsule Take 100 mg by mouth 2 (two) times daily as needed (constipation).  08/30/16  Yes Historical Provider, MD  furosemide (LASIX) 20 MG tablet Take 1 tablet (20 mg total) by mouth daily. 09/29/16  Yes Sherren Mocha, MD  HYDROcodone-acetaminophen (NORCO/VICODIN) 5-325 MG tablet Take 1  tablet by mouth every 4 (four) hours as needed for moderate pain. 07/06/16  Yes Mary B Dixon, PA-C  isosorbide mononitrate (IMDUR) 30 MG 24 hr tablet Take 1 tablet (30 mg total) by mouth daily. 09/29/16 12/28/16 Yes Sherren Mocha, MD  levothyroxine (SYNTHROID, LEVOTHROID) 137 MCG tablet TAKE 1 TABLET (137 MCG TOTAL) BY MOUTH DAILY BEFORE BREAKFAST. 04/11/16  Yes Susy Frizzle, MD  lidocaine-prilocaine (EMLA) cream Apply 1 application topically as directed. Apply cream to portacath site approximately 1 hour before access and cover 09/24/16  Yes Historical Provider, MD  LORazepam (ATIVAN) 1 MG tablet Take 1 mg by mouth every 8 (eight) hours as needed for anxiety.  03/31/16  Yes Historical Provider, MD  metoprolol tartrate (LOPRESSOR) 25 MG tablet Take 1 tablet (25 mg total) by  mouth 2 (two) times daily. 08/22/16  Yes Sherren Mocha, MD  nitroGLYCERIN (NITROSTAT) 0.4 MG SL tablet Place 1 tablet (0.4 mg total) under the tongue every 5 (five) minutes as needed for chest pain. 09/29/16  Yes Sherren Mocha, MD  oxycodone (OXY-IR) 5 MG capsule Take 5 mg by mouth every 6 (six) hours as needed. pain 08/30/16  Yes Historical Provider, MD  prochlorperazine (COMPAZINE) 10 MG tablet Take 10 mg by mouth every 6 (six) hours as needed. for nausea 09/09/16  Yes Historical Provider, MD  senna-docusate (SENOKOT-S) 8.6-50 MG tablet Take 1 tablet by mouth 2 (two) times daily as needed. Constipation 08/30/16  Yes Historical Provider, MD    Family History Family History  Problem Relation Age of Onset  . Heart disease Mother   . Heart disease Father   . Heart attack Father   . Cancer Maternal Grandfather   . Heart disease Brother   . Stroke Brother   . Colon cancer Maternal Aunt   . Skin cancer Maternal Uncle   . Stomach cancer Maternal Uncle   . Heart disease Maternal Grandmother   . Breast cancer Maternal Aunt     Social History Social History  Substance Use Topics  . Smoking status: Former Smoker    Packs/day:  1.50    Years: 40.00    Types: Cigarettes    Quit date: 09/18/2012  . Smokeless tobacco: Never Used     Comment: PT STATES THAT SHE QUIT SMOKING 09/27/11. PATIENT STATES THAT SHE HAS NOT "INHALED" IN LAST 4 YEARS.   Marland Kitchen Alcohol use No     Comment: Pt reports "it's rare"     Allergies   Silver   Review of Systems Review of Systems  Cardiovascular: Positive for chest pain.  All other systems reviewed and are negative.    Physical Exam Updated Vital Signs BP 116/66   Pulse 82   Temp 98.2 F (36.8 C)   Resp 17   Ht '5\' 7"'$  (1.702 m)   Wt 189 lb (85.7 kg)   SpO2 97%   BMI 29.60 kg/m   Physical Exam  Constitutional: She is oriented to person, place, and time. She appears well-developed and well-nourished. No distress.  HENT:  Head: Normocephalic and atraumatic.  Mouth/Throat: Oropharynx is clear and moist.  Eyes: Conjunctivae and EOM are normal. Pupils are equal, round, and reactive to light.  Neck: Normal range of motion. Neck supple.  Cardiovascular: Normal rate, regular rhythm and intact distal pulses.   No murmur heard. Pulmonary/Chest: Effort normal and breath sounds normal. No respiratory distress. She has no wheezes. She has no rales. She exhibits no tenderness.  Port-a-cath present in the RU chest.  Well healed sternotomy scar  Abdominal: Soft. She exhibits no distension. There is no tenderness. There is no rebound and no guarding.  Musculoskeletal: Normal range of motion. She exhibits no edema or tenderness.  No calf pain or swelling  Neurological: She is alert and oriented to person, place, and time.  Skin: Skin is warm and dry. No rash noted. No erythema.  Psychiatric: She has a normal mood and affect. Her behavior is normal.  Nursing note and vitals reviewed.    ED Treatments / Results  Labs (all labs ordered are listed, but only abnormal results are displayed) Labs Reviewed  BASIC METABOLIC PANEL - Abnormal; Notable for the following:       Result Value     Glucose, Bld 125 (*)    All other components within  normal limits  CBC  TROPONIN I    EKG  EKG Interpretation  Date/Time:  Saturday October 08 2016 18:17:46 EST Ventricular Rate:  83 PR Interval:  178 QRS Duration: 94 QT Interval:  418 QTC Calculation: 491 R Axis:   35 Text Interpretation:  Sinus rhythm with occasional Premature ventricular complexes Cannot rule out Anterior infarct , age undetermined interior ST elevation resolved since prior tracing Confirmed by [facility]  MD, Iridessa Harrow ([id number]) on 10/08/2016 6:28:53 PM       Radiology Dg Chest Port 1 View  Result Date: 10/08/2016 CLINICAL DATA:  Chest pain for several days, worsened today. EXAM: PORTABLE CHEST 1 VIEW COMPARISON:  07/22/16 FINDINGS: Unchanged mild cardiomegaly. The lungs are clear. The pulmonary vasculature is normal. No pleural effusions. Hilar and mediastinal contours are unremarkable and unchanged. Right jugular port appears satisfactorily positioned with tip at the cavoatrial junction. IMPRESSION: No active disease. Electronically Signed   By: Andreas Newport M.D.   On: 10/08/2016 19:51    Procedures Procedures (including critical care time)  Medications Ordered in ED Medications - No data to display   Initial Impression / Assessment and Plan / ED Course  I have reviewed the triage vital signs and the nursing notes.  Pertinent labs & imaging results that were available during my care of the patient were reviewed by me and considered in my medical decision making (see chart for details).    Patient is a 61 year old with multiple medical problems presenting today with 2 episodes of near syncope and chest pain. Patient has known coronary artery disease. She presented with N STEMI less than 1 year ago which underwent complex PCI resulting in coronary dissection an acute inferior MI resulting in emergent CABG.  Patient saw her cardiologist Dr. Burt Knack on 09/29/2016 and at that time she was having similar  symptoms as today and it was concerning for angina. At that time they discussed stress testing or cardiac catheterization but she wanted to try medical management.  Today EKG without acute findings. CBC, BMP and troponin are within normal limits. Chest x-ray without acute findings. Low suspicion that this is a PE. She has no infectious symptoms suggesting pneumonia and low suspicion for dissection. We'll discuss with cardiology for further recommendations.  Final Clinical Impressions(s) / ED Diagnoses   Final diagnoses:  Nonspecific chest pain  Near syncope    New Prescriptions New Prescriptions   No medications on file     Blanchie Dessert, MD 10/08/16 [id number]

## 2016-10-08 NOTE — ED Triage Notes (Signed)
The pt has arrived by [facility]  Chest pain for a few days worse today  With bilateral shoulder pain up throuogh her anterior neck.    Pain 3/10 then she was  Given sl nitro that has her pain   To a 0/10

## 2016-10-08 NOTE — ED Notes (Signed)
rn on 2 w  Called me to get report  I was on another floor  When I called him back   He is in a room with a pot will call me back

## 2016-10-08 NOTE — H&P (Signed)
History and Physical  Patient Name: Sheri Hernandez     DUK:[id number]    DOB: March 07, 1956    DOA: 10/08/2016 PCP: Odette Fraction, MD   Patient coming from: Home     Chief Complaint: Chest pain  HPI: Sheri Hernandez is a 61 y.o. female with a past medical history significant for metastatic renal CA ECOG 1, CAD s/p CABG in 2016, and HTN who presents with chest discomfort and near syncope.  The patient had an episode of CoNS bacteremia within the last month that is resolving.  She was admitted to [facility] for neck pain and abnormal MRI for presumptive discitis and blood cultures grew persistent CoNS.  Intraoperatively however, it was thought she did NOT have discitis and her surgical cultures were negative, so she was put on OPAT vancomycin by [facility], and they salvaged her port with 2 weeks IV vancomycin after her last postive blood culture, finishing therapy on 09/19/16.  No fever, neck pain since then.  Then about 2 weeks ago she developed a new chest pain.  Intermittent, burning, similar to previous angina, but lasting minutes, not exertional, and resolving spontaneously.  She was seen 1/25 by her Cardiologist Dr. Burt Knack, offered medial mgmt, nuc stress or cath, and declined cath strenuously because of previous complications from her cath in 2016. Instead she started Imdur 15 mg (had some brief headaches) and tolerated it well enough to increase the dose to 30 daily about three days ago.  Today, she was at [facility] with family and felt a "twinge" or tightness in her chest, "like a discomfort" followed by a vague malaise, "seeing stars" feeling lightheaded and having some SOB.  She finished her meal, took a hydrocodone, then an oxycodone back-to-back, left and went to an [place] deli where she had "my legs go out from me" and felt again, vague malaise "weird".  She went home, and was debating about whether to cancel her trip to the beach tomorrow, when she had an episode where she stood up, felt  lightheaded and again a discomfort in her chest, and so she came tot he ER.  ED course: -The bowel, heart rate 87, respirations and pulse ox normal, blood pressure 151/77 -Initial ECG showed no ST changes and troponin was negative. -Na 140, K 3.6, Cr 0.86, WBC 7.5, Hgb 12.2 -CXR was unremarkable -The case was discussed with Cardiology by EDP who recommended serial enzymes and observation on the medicine service      Review of Systems:  Review of Systems  Constitutional: Positive for malaise/fatigue.  Eyes:       Stars  Respiratory: Positive for shortness of breath. Negative for cough, hemoptysis and sputum production.   Cardiovascular: Positive for chest pain. Negative for orthopnea and leg swelling.  Neurological: Positive for dizziness. Negative for tingling, tremors, sensory change, speech change, focal weakness, seizures, loss of consciousness and headaches.  All other systems reviewed and are negative.    Past Medical History:  Diagnosis Date  . Anxiety   . Asthma   . Cancer [facility])    metastatic renal cell carcinoma (biopsy of left renal mass 10/2012) On nivolumab at [facility]  . Complication of anesthesia    hard time waking up 20 yrs. ago when having wisdon teeth removed  . Hypertension   . Lung mass    LUNG MASSES--COUGH-BIOPSY NON-DIAGNOSTIC.  HX OF MEDIASTINAL MASS AND LUNG MASSES AGE 60 -NEGATIVE BIOSPY-BUT GIVEN DIAGNOSIS OF SARCOIDOSIS.  WORK UP CONTINUING ON DIAGNOSIS FOR PT'S LUNG MASSES--SHE  HAS BIOPSY PROVEN KIDNEY CANCER  . 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  . Papilloma of breast 2012   left. lumpectomy  . S/p nephrectomy    left, due to metastatic RCC, 3/14  . Shortness of breath    WITH EXERTION    Past Surgical History:  Procedure Laterality Date    . BREAST BIOPSY  08/18/2011   Procedure: BREAST BIOPSY WITH NEEDLE LOCALIZATION;  Surgeon: Merrie Roof, MD;  Location: [place];  Service: General;  Laterality: Left;  left breast biopsy with needle localization  . BRONCHIAL BRUSH BIOPSY    . CORONARY ARTERY BYPASS GRAFT N/A 01/02/2015   Procedure: CORONARY ARTERY BYPASS GRAFTING (CABG)TIMES TWO USING RIGHT GREATER SAPHENOUS LEG VEIN HARVESTED ENDOSCOPICALLY;  Surgeon: [name], MD;  Location: [place];  Service: Open Heart Surgery;  Laterality: N/A;  . LAPAROSCOPIC NEPHRECTOMY Left 11/22/2012   Procedure: LAPAROSCOPIC NEPHRECTOMY;  Surgeon: Dutch Gray, MD;  Location: [facility];  Service: Urology;  Laterality: Left;  . LEFT HEART CATHETERIZATION WITH CORONARY ANGIOGRAM N/A 01/02/2015   Procedure: LEFT HEART CATHETERIZATION WITH CORONARY ANGIOGRAM;  Surgeon: Leonie Man, MD;  [facility];  Service: Cardiovascular;  Laterality: N/A;  . NECK LESION BIOPSY    . TONSILLECTOMY  1967  . VIDEO BRONCHOSCOPY  10/11/2012   Procedure: VIDEO BRONCHOSCOPY WITH FLUORO;  Surgeon: Kathee Delton, MD;  Location: [facility];  Service: Cardiopulmonary;  Laterality: Bilateral;  . WIDS    . WISDOM TOOTH EXTRACTION      Social History: Patient lives with her husband.  She is from [place].  Patient walks unassited. S he si a former smoker.    Allergies  Allergen Reactions  . Silver Dermatitis    Please use mepalex    Family history: family history includes Aortic dissection in her brother; Breast cancer in her maternal aunt; Cancer in her maternal grandfather; Colon cancer in her maternal aunt; Heart attack in her father; Heart disease in her brother, father, maternal grandmother, and mother; Skin cancer in her maternal uncle; Stomach cancer in her maternal uncle; Stroke in her brother.  Prior to Admission medications   Medication Sig Start Date End Date Taking? Authorizing Provider  amLODipine (NORVASC) 5 MG tablet Take 2.5 mg by mouth  daily.   Yes Historical Provider, MD  aspirin 81 MG EC tablet Take 1 tablet (81 mg total) by mouth daily. 02/09/16  Yes Sherren Mocha, MD  atorvastatin (LIPITOR) 40 MG tablet TAKE 1 TABLET (40 MG TOTAL) BY MOUTH DAILY. 10/05/16  Yes Sherren Mocha, MD  cyclobenzaprine (FLEXERIL) 10 MG tablet Take 1 tablet (10 mg total) by mouth 3 (three) times daily as needed for muscle spasms. 07/06/16  Yes Mary B Dixon, PA-C  docusate sodium (COLACE) 100 MG capsule Take 100 mg by mouth 2 (two) times daily as needed (constipation).  08/30/16  Yes Historical Provider, MD  furosemide (LASIX) 20 MG tablet Take 1 tablet (20 mg total) by mouth daily. 09/29/16  Yes Sherren Mocha, MD  HYDROcodone-acetaminophen (NORCO/VICODIN) 5-325 MG tablet Take 1 tablet by mouth every 4 (four) hours as needed for moderate pain. 07/06/16  Yes Mary B Dixon, PA-C  isosorbide mononitrate (IMDUR) 30 MG 24 hr tablet Take 1 tablet (30  mg total) by mouth daily. 09/29/16 12/28/16 Yes Sherren Mocha, MD  levothyroxine (SYNTHROID, LEVOTHROID) 137 MCG tablet TAKE 1 TABLET (137 MCG TOTAL) BY MOUTH DAILY BEFORE BREAKFAST. 04/11/16  Yes Susy Frizzle, MD  lidocaine-prilocaine (EMLA) cream Apply 1 application topically as directed. Apply cream to portacath site approximately 1 hour before access and cover 09/24/16  Yes Historical Provider, MD  LORazepam (ATIVAN) 1 MG tablet Take 1 mg by mouth every 8 (eight) hours as needed for anxiety.  03/31/16  Yes Historical Provider, MD  metoprolol tartrate (LOPRESSOR) 25 MG tablet Take 1 tablet (25 mg total) by mouth 2 (two) times daily. 08/22/16  Yes Sherren Mocha, MD  nitroGLYCERIN (NITROSTAT) 0.4 MG SL tablet Place 1 tablet (0.4 mg total) under the tongue every 5 (five) minutes as needed for chest pain. 09/29/16  Yes Sherren Mocha, MD  oxycodone (OXY-IR) 5 MG capsule Take 5 mg by mouth every 6 (six) hours as needed. pain 08/30/16  Yes Historical Provider, MD  prochlorperazine (COMPAZINE) 10 MG tablet Take 10 mg by  mouth every 6 (six) hours as needed. for nausea 09/09/16  Yes Historical Provider, MD  senna-docusate (SENOKOT-S) 8.6-50 MG tablet Take 1 tablet by mouth 2 (two) times daily as needed. Constipation 08/30/16  Yes Historical Provider, MD       Physical Exam: BP 113/80   Pulse 93   Temp 98.2 F (36.8 C)   Resp 17   Ht '5\' 7"'$  (1.702 m)   Wt 85.7 kg (189 lb)   SpO2 96%   BMI 29.60 kg/m  General appearance: Well-developed, adult female, alert and in no acute distress.   Eyes: Anicteric, conjunctiva pink, lids and lashes normal.     ENT: No nasal deformity, discharge, or epistaxis.  OP moist without lesions.   Skin: Warm and dry.   Cardiac: RRR, nl S1-S2, no murmurs appreciated.  Capillary refill is brisk.  JVP normal.  No pitting LE edema.  Radial and DP pulses 2+ and symmetric.  No carotid bruits. Respiratory: Normal respiratory rate and rhythm.  CTAB without rales or wheezes. GI: Abdomen soft without rigidity.  No TTP. No ascites, distension.   MSK: No deformities or effusions.   Pain not reproduced with palpation of precordium.  No pain with arm movement. Neuro: Sensorium intact and responding to questions, attention normal.  Speech is fluent.  Moves all extremities equally and with normal coordination and symmetric 5/5 strength.    Cranial nerves 3-12 intact.   Psych: Behavior appropriate.  Affect normal.  No evidence of aural or visual hallucinations or delusions.       Labs on Admission:  The metabolic panel shows normal electrolytes and renal function. The complete blood count shows normal WBC, hemoglobin and platelets. The initial troponin is engative.  Radiological Exams on Admission: Personally reviewed CxR shows normal mediastinum and no airspace opacities: Dg Chest Port 1 View  Result Date: 10/08/2016 CLINICAL DATA:  Chest pain for several days, worsened today. EXAM: PORTABLE CHEST 1 VIEW COMPARISON:  07/22/16 FINDINGS: Unchanged mild cardiomegaly. The lungs are clear. The  pulmonary vasculature is normal. No pleural effusions. Hilar and mediastinal contours are unremarkable and unchanged. Right jugular port appears satisfactorily positioned with tip at the cavoatrial junction. IMPRESSION: No active disease. Electronically Signed   By: Andreas Newport M.D.   On: 10/08/2016 19:51    EKG: Independently reviewed. Rate 83, QTc 491, no ST changes.  PVC.  Echocardiogram June 2017:  EF 55-60% Grade I DD No valve  disease        Assessment/Plan  1. Chest pain: Possible angina.  Possibly her dizziness is side effect from increased dose Imdur.  Concern for PE given cancer hx.  Doubt dissection, although personal history of coronary dissection and brother with aortic dissection. -Serial troponins are ordered -Telemetry -Echocardiogram ordered -Obtain CT angiogram to rule out PE -Consult to cardiology, appreciate recommendations -Continue aspirin and statin -Hold beta blocker in case of stress testing -NPO after midnight    2. Hypothyroidism:  -Continue levothyroxine  3. Hypertension:  -Continue amlodipine, furosemide  4. Renal cell cancer:  On nivolumab at [facility] with Dr. Marcello Moores -Continue oxycodone PRN   5. Other medications:  -Continue lorazepam PRN      DVT prophylaxis: Lovenox Diet: NPO after 4am for anticipated stress testing Code Status: FULL  Family Communication: None present  Disposition Plan: Anticipate overnight observation for arrhythmia on telemetry, serial troponins and subsequent risk stratification by Cardiology.  If testing negative, home after. Consults called: Cardiology Admission status: Telemetry, OBS   Medical decision making: Patient seen at 10:18 PM on 10/08/2016.  The patient was discussed with Dr. Radford Pax and dr. Maryan Rued. What exists of the patient's chart was reviewed in depth.  Clinical condition: stable.      Edwin Dada [facility] Pager [phone]

## 2016-10-08 NOTE — ED Notes (Signed)
The pt has a rt upper chest porta cath  She has stage 4 kidney cancer and is treated at [facility]

## 2016-10-08 NOTE — ED Notes (Signed)
The pt is comfortable  Playing on her phone  She has no pain

## 2016-10-09 ENCOUNTER — Encounter (HOSPITAL_COMMUNITY): Payer: Self-pay | Admitting: Nurse Practitioner

## 2016-10-09 ENCOUNTER — Observation Stay (HOSPITAL_BASED_OUTPATIENT_CLINIC_OR_DEPARTMENT_OTHER): Payer: 59

## 2016-10-09 DIAGNOSIS — I10 Essential (primary) hypertension: Secondary | ICD-10-CM | POA: Diagnosis not present

## 2016-10-09 DIAGNOSIS — I209 Angina pectoris, unspecified: Secondary | ICD-10-CM | POA: Diagnosis not present

## 2016-10-09 DIAGNOSIS — I200 Unstable angina: Secondary | ICD-10-CM | POA: Diagnosis not present

## 2016-10-09 DIAGNOSIS — R55 Syncope and collapse: Secondary | ICD-10-CM

## 2016-10-09 DIAGNOSIS — I25110 Atherosclerotic heart disease of native coronary artery with unstable angina pectoris: Secondary | ICD-10-CM | POA: Diagnosis not present

## 2016-10-09 DIAGNOSIS — I25700 Atherosclerosis of coronary artery bypass graft(s), unspecified, with unstable angina pectoris: Secondary | ICD-10-CM | POA: Diagnosis not present

## 2016-10-09 DIAGNOSIS — Z951 Presence of aortocoronary bypass graft: Secondary | ICD-10-CM | POA: Diagnosis not present

## 2016-10-09 DIAGNOSIS — R079 Chest pain, unspecified: Secondary | ICD-10-CM | POA: Diagnosis not present

## 2016-10-09 DIAGNOSIS — E032 Hypothyroidism due to medicaments and other exogenous substances: Secondary | ICD-10-CM | POA: Diagnosis not present

## 2016-10-09 DIAGNOSIS — C649 Malignant neoplasm of unspecified kidney, except renal pelvis: Secondary | ICD-10-CM | POA: Diagnosis not present

## 2016-10-09 LAB — ECHOCARDIOGRAM COMPLETE
HEIGHTINCHES: 68 in
Weight: 3100.8 oz

## 2016-10-09 LAB — TROPONIN I
Troponin I: <0.03 ng/mL (ref <0.03)
Troponin I: <0.03 ng/mL (ref <0.03)

## 2016-10-09 MED ORDER — SACCHAROMYCES BOULARDII 250 MG PO CAPS
250.0000 mg | ORAL_CAPSULE | Freq: Two times a day (BID) | ORAL | Status: DC
  Administered 2016-10-09 – 2016-10-12 (×6): 250 mg via ORAL
  Filled 2016-10-09 (×6): qty 1

## 2016-10-09 MED ORDER — ACETAMINOPHEN 325 MG PO TABS
650.0000 mg | ORAL_TABLET | ORAL | Status: DC | PRN
  Administered 2016-10-09 – 2016-10-11 (×2): 650 mg via ORAL
  Filled 2016-10-09 (×2): qty 2

## 2016-10-09 MED ORDER — ONDANSETRON HCL 4 MG/2ML IJ SOLN: 4.0000 mg | Freq: Four times a day (QID) | INTRAMUSCULAR | Status: DC | PRN

## 2016-10-09 MED ORDER — ASPIRIN EC 81 MG PO TBEC
81.0000 mg | DELAYED_RELEASE_TABLET | Freq: Every day | ORAL | Status: DC
  Administered 2016-10-09 – 2016-10-12 (×3): 81 mg via ORAL
  Filled 2016-10-09 (×3): qty 1

## 2016-10-09 MED ORDER — GI COCKTAIL ~~LOC~~: 30.0000 mL | Freq: Four times a day (QID) | ORAL | Status: DC | PRN

## 2016-10-09 MED ORDER — LORAZEPAM 1 MG PO TABS
1.0000 mg | ORAL_TABLET | Freq: Three times a day (TID) | ORAL | Status: DC | PRN
  Administered 2016-10-09 – 2016-10-11 (×5): 1 mg via ORAL
  Filled 2016-10-09 (×5): qty 1

## 2016-10-09 MED ORDER — OXYCODONE HCL 5 MG PO TABS
5.0000 mg | ORAL_TABLET | Freq: Four times a day (QID) | ORAL | Status: DC | PRN
  Administered 2016-10-09 – 2016-10-12 (×11): 5 mg via ORAL
  Filled 2016-10-09 (×11): qty 1

## 2016-10-09 MED ORDER — LEVOTHYROXINE SODIUM 25 MCG PO TABS
137.0000 ug | ORAL_TABLET | Freq: Every day | ORAL | Status: DC
  Administered 2016-10-09 – 2016-10-12 (×4): 137 ug via ORAL
  Filled 2016-10-09 (×4): qty 1

## 2016-10-09 MED ORDER — FUROSEMIDE 20 MG PO TABS
20.0000 mg | ORAL_TABLET | Freq: Every day | ORAL | Status: DC
Start: 2016-10-09 — End: 2016-10-12
  Administered 2016-10-09 – 2016-10-12 (×4): 20 mg via ORAL
  Filled 2016-10-09 (×4): qty 1

## 2016-10-09 MED ORDER — CYCLOBENZAPRINE HCL 10 MG PO TABS: 10.0000 mg | ORAL_TABLET | Freq: Three times a day (TID) | ORAL | Status: DC | PRN

## 2016-10-09 MED ORDER — ENOXAPARIN SODIUM 40 MG/0.4ML ~~LOC~~ SOLN
40.0000 mg | SUBCUTANEOUS | Status: DC
  Filled 2016-10-09: qty 0.4

## 2016-10-09 MED ORDER — ATORVASTATIN CALCIUM 40 MG PO TABS
40.0000 mg | ORAL_TABLET | Freq: Every day | ORAL | Status: DC
  Administered 2016-10-09 – 2016-10-12 (×4): 40 mg via ORAL
  Filled 2016-10-09 (×4): qty 1

## 2016-10-09 MED ORDER — AMLODIPINE BESYLATE 5 MG PO TABS
2.5000 mg | ORAL_TABLET | Freq: Every day | ORAL | Status: DC
  Administered 2016-10-09 – 2016-10-10 (×2): 2.5 mg via ORAL
  Filled 2016-10-09 (×4): qty 1

## 2016-10-09 MED ORDER — ISOSORBIDE MONONITRATE ER 30 MG PO TB24
30.0000 mg | ORAL_TABLET | Freq: Every day | ORAL | Status: DC
  Administered 2016-10-09 – 2016-10-12 (×4): 30 mg via ORAL
  Filled 2016-10-09 (×4): qty 1

## 2016-10-09 NOTE — Consult Note (Signed)
Reason for Consult: Chest pain, history of CAD   Requesting Physician: Wyline Copas   Cardiologist: Burt Knack  HPI: This is a 61 y.o. female with a past medical history significant for coronary artery disease with bypass surgery in April 2016 following occluded right coronary artery occlusion during angioplasty, also with 95% ostial circumflex stenosis and moderate LAD disease (SVG 2 to RPDA and L PVB).  Thrive January she has had problems with possible cervical discitis and has received intravenous vancomycin for staph non-aureus bacteremia.  She has had intermittent burning retrosternal pain that is not related necessarily to exertion. This has occurred when walking her dog but also when bending over the sink to brush her teeth. It is routinely brief and remit spontaneously. Repeat cardiac catheterization has been discussed with Dr. Burt Knack, but the patient declined due to concerns about possible complications. She had little relief with indoor 50 mg daily but about 4 or 5 days ago started taking a full tablet and has noticed improvement. However she has had frequent episodes of dizziness and lightheadedness in a pattern that is suggestive of orthostatic hypotension. She has been taking the long-acting nitrate in the afternoon. Most of her episodes of chest discomfort occur in the morning.  PMHx:  Past Medical History:  Diagnosis Date  . Anxiety   . Asthma   . CAD (coronary artery disease)    a. 12/2014 Cath: LM nl, LAD 20p, 36m D2 20ost, LCX 95p, 854mRCA dominant, 95p, 802m RPDA 95, RPL2 50, attempted RCA PCI w/ acute RCA occlusion-->CABG x 2 VG->RPDA->PLVB.  [facility]  metastatic renal cell carcinoma (biopsy of left renal mass 10/2012) [facility] Complication of [facility]    hard time waking up 20 yrs. ago when having wisdon teeth removed  . Hypertension   . Lung mass    LUNG MASSES--COUGH-BIOPSY NON-DIAGNOSTIC.  HX OF MEDIASTINAL MASS AND LUNG MASSES AGE 89  -NEGATIVE BIOSPY-BUT GIVEN DIAGNOSIS OF SARCOIDOSIS.  WORK UP CONTINUING ON DIAGNOSIS FOR PT'S LUNG MASSES--SHE HAS BIOPSY PROVEN KIDNEY CANCER  . 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  . Papilloma of breast 2012   left. lumpectomy  . S/p nephrectomy    left, due to metastatic RCC, 3/14  . Shortness of breath    WITH EXERTION   Past Surgical History:  Procedure Laterality Date  . BREAST BIOPSY  08/18/2011   Procedure: BREAST BIOPSY WITH NEEDLE LOCALIZATION;  Surgeon: PauMerrie RoofD;  [facility];  Laterality: Left;  left breast biopsy with needle localization  . BRONCHIAL BRUSH BIOPSY    . CORONARY ARTERY BYPASS GRAFT N/A 01/02/2015   Procedure: CORONARY ARTERY BYPASS GRAFTING (CABG)TIMES TWO USING RIGHT GREATER SAPHENOUS LEG VEIN HARVESTED ENDOSCOPICALLY;  Surgeon: EdwGrace IsaacD;  [facility];  Laterality: N/A;  . LAPAROSCOPIC NEPHRECTOMY Left 11/22/2012   Procedure: LAPAROSCOPIC NEPHRECTOMY;  Surgeon: LesDutch GrayD;  [facility];  Service: Urology;  Laterality: Left;  . LEFT HEART CATHETERIZATION WITH CORONARY ANGIOGRAM N/A 01/02/2015   Procedure: LEFT HEART CATHETERIZATION WITH CORONARY ANGIOGRAM;  Surgeon: DavLeonie ManD;  [facility];  Service: Cardiovascular;  Laterality: N/A;  .  NECK LESION BIOPSY    . TONSILLECTOMY  1967  . VIDEO BRONCHOSCOPY  10/11/2012   Procedure: VIDEO BRONCHOSCOPY WITH FLUORO;  Surgeon: Kathee Delton, MD;  Location: [facility];  Service: Cardiopulmonary;  Laterality: Bilateral;  . WIDS    . WISDOM TOOTH EXTRACTION      FAMHx: Family History  Problem Relation Age of Onset  . Heart disease Mother   . Heart disease Father   . Heart attack Father   . Cancer Maternal  Grandfather   . Heart disease Brother   . Stroke Brother   . Aortic dissection Brother   . Colon cancer Maternal Aunt   . Skin cancer Maternal Uncle   . Stomach cancer Maternal Uncle   . Heart disease Maternal Grandmother   . Breast cancer Maternal Aunt     SOCHx:  reports that she quit smoking about 4 years ago. Her smoking use included Cigarettes. She has a 60.00 pack-year smoking history. She has never used smokeless tobacco. She reports that she does not drink alcohol or use drugs.  ALLERGIES: Allergies  Allergen Reactions  . Silver Dermatitis    Please use mepalex    ROS: Pertinent items noted in HPI and remainder of comprehensive ROS otherwise negative.  HOME MEDICATIONS: Prescriptions Prior to Admission  Medication Sig Dispense Refill Last Dose  . amLODipine (NORVASC) 5 MG tablet Take 2.5 mg by mouth daily.   Past Week at Unknown time  . aspirin 81 MG EC tablet Take 1 tablet (81 mg total) by mouth daily.   10/08/2016 at Unknown time  . atorvastatin (LIPITOR) 40 MG tablet TAKE 1 TABLET (40 MG TOTAL) BY MOUTH DAILY. 90 tablet 0 10/07/2016 at Unknown time  . cyclobenzaprine (FLEXERIL) 10 MG tablet Take 1 tablet (10 mg total) by mouth 3 (three) times daily as needed for muscle spasms. 30 tablet 1 Past Week at Unknown time  . docusate sodium (COLACE) 100 MG capsule Take 100 mg by mouth 2 (two) times daily as needed (constipation).    Past Month at Unknown time  . furosemide (LASIX) 20 MG tablet Take 1 tablet (20 mg total) by mouth daily. 30 tablet 11 10/08/2016 at Unknown time  . HYDROcodone-acetaminophen (NORCO/VICODIN) 5-325 MG tablet Take 1 tablet by mouth every 4 (four) hours as needed for moderate pain. 20 tablet 0 10/08/2016 at Unknown time  . isosorbide mononitrate (IMDUR) 30 MG 24 hr tablet Take 1 tablet (30 mg total) by mouth daily. 30 tablet 3 Past Week at Unknown time  . levothyroxine (SYNTHROID, LEVOTHROID) 137 MCG tablet TAKE 1 TABLET (137 MCG TOTAL) BY MOUTH DAILY BEFORE  BREAKFAST. 30 tablet 5 10/08/2016 at Unknown time  . lidocaine-prilocaine (EMLA) cream Apply 1 application topically as directed. Apply cream to portacath site approximately 1 hour before access and cover  3 Past Week at Unknown time  . LORazepam (ATIVAN) 1 MG tablet Take 1 mg by mouth every 8 (eight) hours as needed for anxiety.    10/08/2016 at Unknown time  . metoprolol tartrate (LOPRESSOR) 25 MG tablet Take 1 tablet (25 mg total) by mouth 2 (two) times daily. 60 tablet 11 10/08/2016 at 0800  . nitroGLYCERIN (NITROSTAT) 0.4 MG SL tablet Place 1 tablet (0.4 mg total) under the tongue every 5 (five) minutes as needed for chest pain. 25 tablet 1 10/08/2016 at Unknown time  . oxycodone (OXY-IR) 5 MG capsule Take 5 mg by mouth every 6 (six) hours as needed. pain   10/08/2016 at Unknown time  .  prochlorperazine (COMPAZINE) 10 MG tablet Take 10 mg by mouth every 6 (six) hours as needed. for nausea  3 Past Month at Unknown time  . senna-docusate (SENOKOT-S) 8.6-50 MG tablet Take 1 tablet by mouth 2 (two) times daily as needed. Constipation   Past Month at Unknown time    HOSPITAL MEDICATIONS: I have reviewed the patient's current medications.  VITALS: Blood pressure (!) 187/87, pulse 93, temperature 98.2 F (36.8 C), temperature source Oral, resp. rate 19, height '5\' 8"'$  (1.727 m), weight 87.9 kg (193 lb 12.8 oz), SpO2 96 %.  PHYSICAL EXAM:  General: Alert, oriented x3, no distress Head: no evidence of trauma, PERRL, EOMI, no exophtalmos or lid lag, no myxedema, no xanthelasma; normal ears, nose and oropharynx Neck: normal jugular venous pulsations and no hepatojugular reflux; brisk carotid pulses without delay and no carotid bruits Chest: clear to auscultation, no signs of consolidation by percussion or palpation, normal fremitus, symmetrical and full respiratory excursions. Right subclavian Port-A-Cath Cardiovascular: normal position and quality of the apical impulse, regular rhythm, normal first heart sound  and normal second heart sound, no rubs or gallops, no murmur Abdomen: no tenderness or distention, no masses by palpation, no abnormal pulsatility or arterial bruits, normal bowel sounds, no hepatosplenomegaly Extremities: no clubbing, cyanosis;  no edema; 2+ radial, ulnar and brachial pulses bilaterally; 2+ right femoral, posterior tibial and dorsalis pedis pulses; 2+ left femoral, posterior tibial and dorsalis pedis pulses; no subclavian or femoral bruits Neurological: grossly nonfocal   LABS  CBC  Recent Labs  10/08/16 1849  WBC 7.5  HGB 12.2  HCT 37.9  MCV 85.4  PLT 627   Basic Metabolic Panel  Recent Labs  10/08/16 1849  NA 140  K 3.6  CL 101  CO2 24  GLUCOSE 125*  BUN 12  CREATININE 0.86  CALCIUM 9.7   Liver Function Tests No results for input(s): AST, ALT, ALKPHOS, BILITOT, PROT, ALBUMIN in the last 72 hours. No results for input(s): LIPASE, AMYLASE in the last 72 hours. Cardiac Enzymes  Recent Labs  10/08/16 1849 10/09/16 0402 10/09/16 1031  TROPONINI <0.03 <0.03 <0.03   BNP Invalid input(s): POCBNP D-Dimer No results for input(s): DDIMER in the last 72 hours. Hemoglobin A1C No results for input(s): HGBA1C in the last 72 hours. Fasting Lipid Panel No results for input(s): CHOL, HDL, LDLCALC, TRIG, CHOLHDL, LDLDIRECT in the last 72 hours. Thyroid Function Tests No results for input(s): TSH, T4TOTAL, T3FREE, THYROIDAB in the last 72 hours.  Invalid input(s): FREET3    IMAGING: Ct Angio Chest Pe W Or Wo Contrast  Result Date: 10/08/2016 CLINICAL DATA:  Chest pain. EXAM: CT ANGIOGRAPHY CHEST WITH CONTRAST TECHNIQUE: Multidetector CT imaging of the chest was performed using the standard protocol during bolus administration of intravenous contrast. Multiplanar CT image reconstructions and MIPs were obtained to evaluate the vascular anatomy. CONTRAST:  100 cc Isovue 370 COMPARISON:  Chest CT dated 10/03/2013. FINDINGS: Cardiovascular: Some of the  peripheral segmental and subsegmental pulmonary artery branches are difficult to characterize due to patient breathing motion artifact but there is no pulmonary embolism identified within the main, lobar or central segmental pulmonary arteries bilaterally. Mild ectasia of the ascending thoracic aorta. Descending thoracic aorta is normal in caliber. Aortic atherosclerosis. Mild cardiomegaly. Status post median sternotomy for presumed CABG. Coronary artery calcifications noted. No pericardial effusion. Mediastinum/Nodes: No mass or enlarged lymph nodes within the mediastinum, perihilar or axillary regions. Esophagus appears normal. Trachea and central bronchi are unremarkable. Lungs/Pleura: Stable chronic  atelectasis/scarring at the left lung base. Mild emphysematous change at each lung apex. Mild scarring at the left lung apex. No evidence of pneumonia. No pleural effusion or pneumothorax. Upper Abdomen: Liver is diffusely low in density indicating fatty infiltration, incompletely imaged. No acute findings. Musculoskeletal: No acute or suspicious osseous finding. Review of the MIP images confirms the above findings. IMPRESSION: 1. No acute findings. No pulmonary embolism seen, with mild study limitations detailed above. 2. Mild cardiomegaly, stable. Status post median sternotomy for presumed CABG. 3. Aortic atherosclerosis. No aortic aneurysm or evidence of aortic dissection. 4. Mild emphysematous change at each lung apex. Chronic scarring/atelectasis at the left lung base, stable or perhaps slightly improved compared to the previous chest CT. No new lung findings. No evidence of pneumonia or pulmonary edema. 5. Fatty infiltration of the liver. Electronically Signed   By: Franki Cabot M.D.   On: 10/08/2016 22:53   Dg Chest Port 1 View  Result Date: 10/08/2016 CLINICAL DATA:  Chest pain for several days, worsened today. EXAM: PORTABLE CHEST 1 VIEW COMPARISON:  07/22/16 FINDINGS: Unchanged mild cardiomegaly. The  lungs are clear. The pulmonary vasculature is normal. No pleural effusions. Hilar and mediastinal contours are unremarkable and unchanged. Right jugular port appears satisfactorily positioned with tip at the cavoatrial junction. IMPRESSION: No active disease. Electronically Signed   By: Andreas Newport M.D.   On: 10/08/2016 19:51    ECHO: 10/09/2016 - Left ventricle: The cavity size was normal. Wall thickness was   increased in a pattern of mild LVH. Systolic function was normal.   The estimated ejection fraction was in the range of 55% to 60%.   Basal-mid inferior hypokinesis. Doppler parameters are consistent   with abnormal left ventricular relaxation (grade 1 diastolic   dysfunction). - Aortic valve: There was no stenosis. There was trivial   regurgitation. - Mitral valve: There was mild regurgitation. - Left atrium: The atrium was mildly dilated. - Right ventricle: The cavity size was normal. Systolic function   was normal. - Tricuspid valve: Peak RV-RA gradient (S): 19 mm Hg. - Pulmonary arteries: PA peak pressure: 22 mm Hg (S). - Inferior vena cava: The vessel was normal in size. The   respirophasic diameter changes were in the normal range (>= 50%),   consistent with normal central venous pressure.  Impressions:  - Normal LV size with EF 55-60%. Mild LV hypertrophy. Basal to mid   inferior hypokinesis. Normal RV size and systolic function. No   significant valvular abnormalities.  ECG: Sinus rhythm with a single PVC  TELEMETRY: NSR  IMPRESSION: 1. Atypical angina. Symptoms are similar in some respects to previous angina pectoris but are not exertional. Some improvement with long-acting nitrates  RECOMMENDATION: 1. She does not wish to undergo repeat cardiac catheterization at this time, unless there is more evidence for severe ischemia. 2. Reassuring low risk cardiac enzymes, low risk ECG and echo, but she has had symptoms since she was hospitalized. 3. Plan to  risk stratify with a nuclear stress test. This will be scheduled in the morning. If there is evidence of sizable areas of reversible ischemia, she will need coronary angiography. 4. Make sure that she takes the long-acting nitrate preparation first thing in the morning. Continue beta blockers and amlodipine as well. Consider Ranexa if she has documented orthostatic hypotension.  Time Spent Directly with Patient: 30 minutes  Sanda Klein, MD, [facility] [phone] office [phone] pager   10/09/2016, 2:21 PM

## 2016-10-09 NOTE — Progress Notes (Signed)
Echocardiogram 2D Echocardiogram has been performed.  Sheri Hernandez 10/09/2016, 9:58 AM

## 2016-10-09 NOTE — Progress Notes (Signed)
PROGRESS NOTE    GHADEER KASTELIC  YIF:[id number] DOB: 1956/07/20 DOA: 10/08/2016 PCP: Odette Fraction, MD    Brief Narrative:  61 y.o. female with a past medical history significant for metastatic renal CA ECOG 1, CAD s/p CABG in 2016, and HTN who presents with chest discomfort and near syncope.  The patient had an episode of CoNS bacteremia within the last month that is resolving.  She was admitted to [facility] for neck pain and abnormal MRI for presumptive discitis and blood cultures grew persistent CoNS.  Intraoperatively however, it was thought she did NOT have discitis and her surgical cultures were negative, so she was put on OPAT vancomycin by [facility], and they salvaged her port with 2 weeks IV vancomycin after her last postive blood culture, finishing therapy on 09/19/16.  No fever, neck pain since then.  Then about 2 weeks ago she developed a new chest pain.  Intermittent, burning, similar to previous angina, but lasting minutes, not exertional, and resolving spontaneously.  She was seen 1/25 by her Cardiologist Dr. Burt Knack, offered medial mgmt, nuc stress or cath, and declined cath strenuously because of previous complications from her cath in 2016. Instead she started Imdur 15 mg (had some brief headaches) and tolerated it well enough to increase the dose to 30 daily about three days ago.  Today, she was at [facility] with family and felt a "twinge" or tightness in her chest, "like a discomfort" followed by a vague malaise, "seeing stars" feeling lightheaded and having some SOB.  She finished her meal, took a hydrocodone, then an oxycodone back-to-back, left and went to an [place] deli where she had "my legs go out from me" and felt again, vague malaise "weird".  She went home, and was debating about whether to cancel her trip to the beach tomorrow, when she had an episode where she stood up, felt lightheaded and again a discomfort in her chest, and so she came tot he ER.  Assessment & Plan:     Principal Problem:   Chest pain Active Problems:   S/P CABG x 2   Drug-induced hypothyroidism   Adenocarcinoma, renal cell (HCC)   Essential hypertension  1. Chest pain: - Possible angina. - Suspect dizziness is side effect from increased dose Imdur.  - CTA chest neg for PE -Serial troponins neg x 3 -Echocardiogram reviewed with findings of basal-mid inferior hypokinesis not present on 6/17 -Obtain CT angiogram to rule out PE -Have consulted Cardiology -Will continue aspirin and statin - Cont to monitor on tele  2. Hypothyroidism:  -Continue levothyroxine - Stable at this time  3. Hypertension:  -Continue amlodipine, furosemide - Stable, albeit suboptimal - Will titrate bp meds as tolerated  4. Renal cell cancer:  - On nivolumab at [facility] with Dr. Marcello Moores -Will continue oxycodone PRN  5. Other medications:  -Will continue lorazepam PRN   DVT prophylaxis: Lovenox subQ Code Status: Full Family Communication: Pt in room, family not at bedside Disposition Plan: Uncertain at this time  Consultants:   Cardiololgy  Procedures:     Antimicrobials: Anti-infectives    None       Subjective: Denies chest pain at this time  Objective: Vitals:   10/08/16 2130 10/08/16 2200 10/08/16 2310 10/09/16 0455  BP: 145/82 155/89 (!) 157/77 (!) 187/87  Pulse: 89 89 88 93  Resp: '17 19 19 19  '$ Temp:   98.9 F (37.2 C) 98.2 F (36.8 C)  TempSrc:   Oral Oral  SpO2: 95% 98%  94% 96%  Weight:   87.9 kg (193 lb 12.8 oz)   Height:   '5\' 8"'$  (1.727 m)    No intake or output data in the 24 hours ending 10/09/16 1325 Filed Weights   10/08/16 1828 10/08/16 2310  Weight: 85.7 kg (189 lb) 87.9 kg (193 lb 12.8 oz)    Examination:  General exam: Appears calm and comfortable  Respiratory system: Clear to auscultation. Respiratory effort normal. Cardiovascular system: S1 & S2 heard, RRR Gastrointestinal system: Abdomen is nondistended, soft and nontender. No organomegaly or  masses felt. Normal bowel sounds heard. Central nervous system: Alert and oriented. No focal neurological deficits. Extremities: Symmetric 5 x 5 power. Skin: No rashes, lesions Psychiatry: Judgement and insight appear normal. Mood & affect appropriate.   Data Reviewed: I have personally reviewed following labs and imaging studies  CBC:  Recent Labs Lab 10/08/16 1849  WBC 7.5  HGB 12.2  HCT 37.9  MCV 85.4  PLT 829   Basic Metabolic Panel:  Recent Labs Lab 10/08/16 1849  NA 140  K 3.6  CL 101  CO2 24  GLUCOSE 125*  BUN 12  CREATININE 0.86  CALCIUM 9.7   GFR: Estimated Creatinine Clearance: 82.7 mL/min (by C-G formula based on SCr of 0.86 mg/dL). Liver Function Tests: No results for input(s): AST, ALT, ALKPHOS, BILITOT, PROT, ALBUMIN in the last 168 hours. No results for input(s): LIPASE, AMYLASE in the last 168 hours. No results for input(s): AMMONIA in the last 168 hours. Coagulation Profile: No results for input(s): INR, PROTIME in the last 168 hours. Cardiac Enzymes:  Recent Labs Lab 10/08/16 1849 10/09/16 0402 10/09/16 1031  TROPONINI <0.03 <0.03 <0.03   BNP (last 3 results) No results for input(s): PROBNP in the last 8760 hours. HbA1C: No results for input(s): HGBA1C in the last 72 hours. CBG: No results for input(s): GLUCAP in the last 168 hours. Lipid Profile: No results for input(s): CHOL, HDL, LDLCALC, TRIG, CHOLHDL, LDLDIRECT in the last 72 hours. Thyroid Function Tests: No results for input(s): TSH, T4TOTAL, FREET4, T3FREE, THYROIDAB in the last 72 hours. Anemia Panel: No results for input(s): VITAMINB12, FOLATE, FERRITIN, TIBC, IRON, RETICCTPCT in the last 72 hours. Sepsis Labs: No results for input(s): PROCALCITON, LATICACIDVEN in the last 168 hours.  No results found for this or any previous visit (from the past 240 hour(s)).   Radiology Studies: Ct Angio Chest Pe W Or Wo Contrast  Result Date: 10/08/2016 CLINICAL DATA:  Chest pain.  EXAM: CT ANGIOGRAPHY CHEST WITH CONTRAST TECHNIQUE: Multidetector CT imaging of the chest was performed using the standard protocol during bolus administration of intravenous contrast. Multiplanar CT image reconstructions and MIPs were obtained to evaluate the vascular anatomy. CONTRAST:  100 cc Isovue 370 COMPARISON:  Chest CT dated 10/03/2013. FINDINGS: Cardiovascular: Some of the peripheral segmental and subsegmental pulmonary artery branches are difficult to characterize due to patient breathing motion artifact but there is no pulmonary embolism identified within the main, lobar or central segmental pulmonary arteries bilaterally. Mild ectasia of the ascending thoracic aorta. Descending thoracic aorta is normal in caliber. Aortic atherosclerosis. Mild cardiomegaly. Status post median sternotomy for presumed CABG. Coronary artery calcifications noted. No pericardial effusion. Mediastinum/Nodes: No mass or enlarged lymph nodes within the mediastinum, perihilar or axillary regions. Esophagus appears normal. Trachea and central bronchi are unremarkable. Lungs/Pleura: Stable chronic atelectasis/scarring at the left lung base. Mild emphysematous change at each lung apex. Mild scarring at the left lung apex. No evidence of pneumonia. No pleural  effusion or pneumothorax. Upper Abdomen: Liver is diffusely low in density indicating fatty infiltration, incompletely imaged. No acute findings. Musculoskeletal: No acute or suspicious osseous finding. Review of the MIP images confirms the above findings. IMPRESSION: 1. No acute findings. No pulmonary embolism seen, with mild study limitations detailed above. 2. Mild cardiomegaly, stable. Status post median sternotomy for presumed CABG. 3. Aortic atherosclerosis. No aortic aneurysm or evidence of aortic dissection. 4. Mild emphysematous change at each lung apex. Chronic scarring/atelectasis at the left lung base, stable or perhaps slightly improved compared to the previous  chest CT. No new lung findings. No evidence of pneumonia or pulmonary edema. 5. Fatty infiltration of the liver. Electronically Signed   By: Franki Cabot M.D.   On: 10/08/2016 22:53   Dg Chest Port 1 View  Result Date: 10/08/2016 CLINICAL DATA:  Chest pain for several days, worsened today. EXAM: PORTABLE CHEST 1 VIEW COMPARISON:  07/22/16 FINDINGS: Unchanged mild cardiomegaly. The lungs are clear. The pulmonary vasculature is normal. No pleural effusions. Hilar and mediastinal contours are unremarkable and unchanged. Right jugular port appears satisfactorily positioned with tip at the cavoatrial junction. IMPRESSION: No active disease. Electronically Signed   By: Andreas Newport M.D.   On: 10/08/2016 19:51    Scheduled Meds: . amLODipine  2.5 mg Oral Daily  . aspirin EC  81 mg Oral Daily  . atorvastatin  40 mg Oral Daily  . enoxaparin (LOVENOX) injection  40 mg Subcutaneous Q24H  . furosemide  20 mg Oral Daily  . isosorbide mononitrate  30 mg Oral Daily  . levothyroxine  137 mcg Oral QAC breakfast   Continuous Infusions:   LOS: 0 days   CHIU, Orpah Melter, MD [facility] Pager [phone]  If 7PM-7AM, please contact night-coverage [email or web address] Password [email or web address] 10/09/2016, 1:25 PM

## 2016-10-10 ENCOUNTER — Observation Stay (HOSPITAL_BASED_OUTPATIENT_CLINIC_OR_DEPARTMENT_OTHER): Payer: 59

## 2016-10-10 ENCOUNTER — Encounter (HOSPITAL_COMMUNITY): Payer: Self-pay | Admitting: General Practice

## 2016-10-10 DIAGNOSIS — C649 Malignant neoplasm of unspecified kidney, except renal pelvis: Secondary | ICD-10-CM | POA: Diagnosis not present

## 2016-10-10 DIAGNOSIS — I25700 Atherosclerosis of coronary artery bypass graft(s), unspecified, with unstable angina pectoris: Secondary | ICD-10-CM | POA: Diagnosis not present

## 2016-10-10 DIAGNOSIS — E032 Hypothyroidism due to medicaments and other exogenous substances: Secondary | ICD-10-CM | POA: Diagnosis not present

## 2016-10-10 DIAGNOSIS — I25110 Atherosclerotic heart disease of native coronary artery with unstable angina pectoris: Secondary | ICD-10-CM | POA: Diagnosis not present

## 2016-10-10 DIAGNOSIS — R072 Precordial pain: Secondary | ICD-10-CM | POA: Diagnosis not present

## 2016-10-10 DIAGNOSIS — R079 Chest pain, unspecified: Secondary | ICD-10-CM | POA: Diagnosis not present

## 2016-10-10 DIAGNOSIS — F419 Anxiety disorder, unspecified: Secondary | ICD-10-CM

## 2016-10-10 DIAGNOSIS — I10 Essential (primary) hypertension: Secondary | ICD-10-CM | POA: Diagnosis not present

## 2016-10-10 LAB — NM MYOCAR MULTI W/SPECT W/WALL MOTION / EF
CHL CUP MPHR: 162 {beats}/min
CSEPED: 1 min
CSEPEDS: 2 s
CSEPEW: 2.9 METS
CSEPHR: 95 %
LHR: 0
Peak HR: 155 {beats}/min
Rest HR: 101 {beats}/min

## 2016-10-10 MED ORDER — TECHNETIUM TC 99M TETROFOSMIN IV KIT
10.0000 | PACK | Freq: Once | INTRAVENOUS | Status: AC | PRN
  Administered 2016-10-10: 10 via INTRAVENOUS

## 2016-10-10 MED ORDER — CLOPIDOGREL BISULFATE 75 MG PO TABS
75.0000 mg | ORAL_TABLET | Freq: Every day | ORAL | Status: DC
  Administered 2016-10-11 – 2016-10-12 (×2): 75 mg via ORAL
  Filled 2016-10-10 (×2): qty 1

## 2016-10-10 MED ORDER — LIDOCAINE-PRILOCAINE 2.5-2.5 % EX CREA
TOPICAL_CREAM | Freq: Once | CUTANEOUS | Status: AC
  Administered 2016-10-10: 12:00:00 via TOPICAL
  Filled 2016-10-10: qty 5

## 2016-10-10 MED ORDER — TECHNETIUM TC 99M TETROFOSMIN IV KIT: 30.0000 | PACK | Freq: Once | INTRAVENOUS | Status: DC | PRN

## 2016-10-10 MED ORDER — METOPROLOL TARTRATE 25 MG PO TABS
25.0000 mg | ORAL_TABLET | Freq: Two times a day (BID) | ORAL | Status: DC
  Administered 2016-10-10 – 2016-10-12 (×5): 25 mg via ORAL
  Filled 2016-10-10 (×5): qty 1

## 2016-10-10 MED ORDER — REGADENOSON 0.4 MG/5ML IV SOLN
INTRAVENOUS | Status: AC
  Filled 2016-10-10: qty 5

## 2016-10-10 MED ORDER — REGADENOSON 0.4 MG/5ML IV SOLN
0.4000 mg | Freq: Once | INTRAVENOUS | Status: DC
  Filled 2016-10-10: qty 5

## 2016-10-10 MED ORDER — CLOPIDOGREL BISULFATE 75 MG PO TABS
300.0000 mg | ORAL_TABLET | Freq: Once | ORAL | Status: AC
  Administered 2016-10-10: 300 mg via ORAL
  Filled 2016-10-10: qty 4

## 2016-10-10 MED ORDER — TECHNETIUM TC 99M TETROFOSMIN IV KIT
30.0000 | PACK | Freq: Once | INTRAVENOUS | Status: AC | PRN
  Administered 2016-10-10: 30 via INTRAVENOUS

## 2016-10-10 NOTE — Progress Notes (Signed)
PROGRESS NOTE    Sheri Hernandez  CVE:[id number] DOB: December 02, 1955 DOA: 10/08/2016 PCP: Odette Fraction, MD    Brief Narrative:  61 y.o. female with a past medical history significant for metastatic renal CA ECOG 1, CAD s/p CABG in 2016, and HTN who presents with chest discomfort and near syncope.  The patient had an episode of CoNS bacteremia within the last month that is resolving.  She was admitted to [facility] for neck pain and abnormal MRI for presumptive discitis and blood cultures grew persistent CoNS.  Intraoperatively however, it was thought she did NOT have discitis and her surgical cultures were negative, so she was put on OPAT vancomycin by [facility], and they salvaged her port with 2 weeks IV vancomycin after her last postive blood culture, finishing therapy on 09/19/16.  No fever, neck pain since then.  Then about 2 weeks ago she developed a new chest pain.  Intermittent, burning, similar to previous angina, but lasting minutes, not exertional, and resolving spontaneously.  She was seen 1/25 by her Cardiologist Dr. Burt Knack, offered medial mgmt, nuc stress or cath, and declined cath strenuously because of previous complications from her cath in 2016. Instead she started Imdur 15 mg (had some brief headaches) and tolerated it well enough to increase the dose to 30 daily about three days ago.  Today, she was at [facility] with family and felt a "twinge" or tightness in her chest, "like a discomfort" followed by a vague malaise, "seeing stars" feeling lightheaded and having some SOB.  She finished her meal, took a hydrocodone, then an oxycodone back-to-back, left and went to an [place] deli where she had "my legs go out from me" and felt again, vague malaise "weird".  She went home, and was debating about whether to cancel her trip to the beach tomorrow, when she had an episode where she stood up, felt lightheaded and again a discomfort in her chest, and so she came tot he ER.  Assessment & Plan:     Principal Problem:   Chest pain Active Problems:   S/P CABG x 2   Drug-induced hypothyroidism   Adenocarcinoma, renal cell (HCC)   Essential hypertension   Near syncope   Anxiety  1. Chest pain: - CTA chest neg for PE -Serial troponins neg x 3 -Echocardiogram reviewed with findings of basal-mid inferior hypokinesis not present on 6/17 -CTA chest neg for PE -Cardiology following - Pt is now s/p abnormal stres on 2/5 with findings of inferolateral defect concerning ofr area of infarct with peri-infarct ischemia -Will continue aspirin and statin - Plan per Cardiology  2. Hypothyroidism:  -Continue levothyroxine - Remains stable at this time  3. Hypertension:  -Continue amlodipine, furosemide - Remains stable, albeit suboptimal - Will titrate bp meds as tolerated  4. Renal cell cancer:  - On nivolumab at [place] with Dr. Marcello Moores -Will continue oxycodone PRN - Stable  5. Other medications:  -Will continue lorazepam PRN   DVT prophylaxis: Lovenox subQ Code Status: Full Family Communication: Pt in room, family not at bedside Disposition Plan: Uncertain at this time  Consultants:   Cardiololgy  Procedures:   Abnormal stress test 2/5  Antimicrobials: Anti-infectives    None      Subjective: Reporting continued chest pain this AM  Objective: Vitals:   10/10/16 0836 10/10/16 1320 10/10/16 1408 10/10/16 1641  BP: (!) 156/79 110/84  129/70  Pulse:  (!) 139 (!) 144 98  Resp:    18  Temp:  98.5 F (36.9 C)  TempSrc:    Oral  SpO2:    99%  Weight:      Height:        Intake/Output Summary (Last 24 hours) at 10/10/16 1731 Last data filed at 10/10/16 0700  Gross per 24 hour  Intake                0 ml  Output                0 ml  Net                0 ml   Filed Weights   10/08/16 1828 10/08/16 2310  Weight: 85.7 kg (189 lb) 87.9 kg (193 lb 12.8 oz)    Examination:  General exam: Sitting in chair, in nad  Respiratory system: normal chest rise,  no audible wheezing Cardiovascular system: regular rate, s1, s2 Gastrointestinal system: soft, obese, nondistened Central nervous system: cn2-12 grossly intact, strength intact Extremities: no clubbing, no cyanosis Skin: normal skin turgor, no notable skin lesions seen Psychiatry: mood normal// no visual halluciations   Data Reviewed: I have personally reviewed following labs and imaging studies  CBC:  Recent Labs Lab 10/08/16 1849  WBC 7.5  HGB 12.2  HCT 37.9  MCV 85.4  PLT 510   Basic Metabolic Panel:  Recent Labs Lab 10/08/16 1849  NA 140  K 3.6  CL 101  CO2 24  GLUCOSE 125*  BUN 12  CREATININE 0.86  CALCIUM 9.7   GFR: Estimated Creatinine Clearance: 82.7 mL/min (by C-G formula based on SCr of 0.86 mg/dL). Liver Function Tests: No results for input(s): AST, ALT, ALKPHOS, BILITOT, PROT, ALBUMIN in the last 168 hours. No results for input(s): LIPASE, AMYLASE in the last 168 hours. No results for input(s): AMMONIA in the last 168 hours. Coagulation Profile: No results for input(s): INR, PROTIME in the last 168 hours. Cardiac Enzymes:  Recent Labs Lab 10/08/16 1849 10/09/16 0402 10/09/16 1031  TROPONINI <0.03 <0.03 <0.03   BNP (last 3 results) No results for input(s): PROBNP in the last 8760 hours. HbA1C: No results for input(s): HGBA1C in the last 72 hours. CBG: No results for input(s): GLUCAP in the last 168 hours. Lipid Profile: No results for input(s): CHOL, HDL, LDLCALC, TRIG, CHOLHDL, LDLDIRECT in the last 72 hours. Thyroid Function Tests: No results for input(s): TSH, T4TOTAL, FREET4, T3FREE, THYROIDAB in the last 72 hours. Anemia Panel: No results for input(s): VITAMINB12, FOLATE, FERRITIN, TIBC, IRON, RETICCTPCT in the last 72 hours. Sepsis Labs: No results for input(s): PROCALCITON, LATICACIDVEN in the last 168 hours.  No results found for this or any previous visit (from the past 240 hour(s)).   Radiology Studies: Ct Angio Chest Pe W Or  Wo Contrast  Result Date: 10/08/2016 CLINICAL DATA:  Chest pain. EXAM: CT ANGIOGRAPHY CHEST WITH CONTRAST TECHNIQUE: Multidetector CT imaging of the chest was performed using the standard protocol during bolus administration of intravenous contrast. Multiplanar CT image reconstructions and MIPs were obtained to evaluate the vascular anatomy. CONTRAST:  100 cc Isovue 370 COMPARISON:  Chest CT dated 10/03/2013. FINDINGS: Cardiovascular: Some of the peripheral segmental and subsegmental pulmonary artery branches are difficult to characterize due to patient breathing motion artifact but there is no pulmonary embolism identified within the main, lobar or central segmental pulmonary arteries bilaterally. Mild ectasia of the ascending thoracic aorta. Descending thoracic aorta is normal in caliber. Aortic atherosclerosis. Mild cardiomegaly. Status post median sternotomy for presumed CABG.  Coronary artery calcifications noted. No pericardial effusion. Mediastinum/Nodes: No mass or enlarged lymph nodes within the mediastinum, perihilar or axillary regions. Esophagus appears normal. Trachea and central bronchi are unremarkable. Lungs/Pleura: Stable chronic atelectasis/scarring at the left lung base. Mild emphysematous change at each lung apex. Mild scarring at the left lung apex. No evidence of pneumonia. No pleural effusion or pneumothorax. Upper Abdomen: Liver is diffusely low in density indicating fatty infiltration, incompletely imaged. No acute findings. Musculoskeletal: No acute or suspicious osseous finding. Review of the MIP images confirms the above findings. IMPRESSION: 1. No acute findings. No pulmonary embolism seen, with mild study limitations detailed above. 2. Mild cardiomegaly, stable. Status post median sternotomy for presumed CABG. 3. Aortic atherosclerosis. No aortic aneurysm or evidence of aortic dissection. 4. Mild emphysematous change at each lung apex. Chronic scarring/atelectasis at the left lung base,  stable or perhaps slightly improved compared to the previous chest CT. No new lung findings. No evidence of pneumonia or pulmonary edema. 5. Fatty infiltration of the liver. Electronically Signed   By: Franki Cabot M.D.   On: 10/08/2016 22:53   Nm Myocar Multi W/spect W/wall Motion / Ef  Result Date: 10/10/2016 CLINICAL DATA:  Chest pain EXAM: MYOCARDIAL IMAGING WITH SPECT (REST AND EXERCISE) GATED LEFT VENTRICULAR WALL MOTION STUDY LEFT VENTRICULAR EJECTION FRACTION TECHNIQUE: Standard myocardial SPECT imaging was performed after resting intravenous injection of 10 mCi Tc-92mtetrofosmin. Subsequently, exercise tolerance test was performed by the patient under the supervision of the Cardiology staff. At peak-stress, 30 mCi Tc-930metrofosmin was injected intravenously and standard myocardial SPECT imaging was performed. Quantitative gated imaging was also performed to evaluate left ventricular wall motion, and estimate left ventricular ejection fraction. COMPARISON:  None. FINDINGS: Perfusion: There is decreased activity noted on rest and stress images in the inferolateral wall, worsening somewhat on stress images. This is concerning for inferolateral infarct with mild peri-infarct ischemia. Wall Motion: Decreased motion inferiorly. Left Ventricular Ejection Fraction: 53 % End diastolic volume 66 ml End systolic volume 31 ml IMPRESSION: 1. Inferolateral defect on rest and stress images, worsening on stress images concerning for area of infarct with peri-infarct ischemia. 2. Decreased inferior wall motion. 3. Left ventricular ejection fraction 53% 4. Non invasive risk stratification*: Intermediate *2012 Appropriate Use Criteria for Coronary Revascularization Focused Update: J Am Coll Cardiol. 208469;62(9):[email or web address] Electronically Signed   By: KeRolm Baptise.D.   On: 10/10/2016 16:19   Dg Chest Port 1 View  Result Date: 10/08/2016 CLINICAL DATA:  Chest  pain for several days, worsened today. EXAM: PORTABLE CHEST 1 VIEW COMPARISON:  07/22/16 FINDINGS: Unchanged mild cardiomegaly. The lungs are clear. The pulmonary vasculature is normal. No pleural effusions. Hilar and mediastinal contours are unremarkable and unchanged. Right jugular port appears satisfactorily positioned with tip at the cavoatrial junction. IMPRESSION: No active disease. Electronically Signed   By: DaAndreas Newport.D.   On: 10/08/2016 19:51    Scheduled Meds: . amLODipine  2.5 mg Oral Daily  . aspirin EC  81 mg Oral Daily  . atorvastatin  40 mg Oral Daily  . enoxaparin (LOVENOX) injection  40 mg Subcutaneous Q24H  . furosemide  20 mg Oral Daily  . isosorbide mononitrate  30 mg Oral Daily  . levothyroxine  137 mcg Oral QAC breakfast  . metoprolol tartrate  25 mg Oral BID  . saccharomyces boulardii  250 mg Oral BID   Continuous Infusions:   LOS: 0 days   CHIU, STOrpah MelterMD [facility] Pager [phone]  If 7PM-7AM, please contact night-coverage [email or web address] Password [facility] 10/10/2016, 5:31 PM

## 2016-10-10 NOTE — Progress Notes (Signed)
Progress Note  Patient Name: Sheri Hernandez Date of Encounter: 10/10/2016  Primary Cardiologist: Burt Knack  Subjective   Since January the patient states she has been having intermittent retrosternal chest pressure which is non-pleuritic in nature. There is occasional radiation to neck b/l and b/l arms stopping where a T-Shirt sleeve would end. This has occurred when walking her dog (uphill) but also while bending over the sink to brush her teeth and lasts <53mn. She rates these pains ranging from 2-10 to 9/10.  She has noticed these symptoms 1-4 times per day since the start of January. She has been taking a long acting nitrate in the afternoon with improvement of her symptoms for the last week. The patient declined repeated Cardiac Catheretization due to concerns about complications.  Today she is very emotional upon arrival to the room after discussing advance directives. She is nervous for stress test. She experienced chest pressure again today while brushing her teeth. The pressure was without radiation and rated 2/10. She believes she would be able to walk on a treadmill today for a stress test but is not sure her exercise capabilities.   Inpatient Medications    Scheduled Meds: . amLODipine  2.5 mg Oral Daily  . aspirin EC  81 mg Oral Daily  . atorvastatin  40 mg Oral Daily  . enoxaparin (LOVENOX) injection  40 mg Subcutaneous Q24H  . furosemide  20 mg Oral Daily  . isosorbide mononitrate  30 mg Oral Daily  . levothyroxine  137 mcg Oral QAC breakfast  . saccharomyces boulardii  250 mg Oral BID   Continuous Infusions:  PRN Meds: acetaminophen, cyclobenzaprine, gi cocktail, LORazepam, ondansetron (ZOFRAN) IV, oxyCODONE   Vital Signs    Vitals:   10/09/16 1556 10/09/16 1639 10/09/16 2207 10/10/16 0836  BP: 126/69 (!) 144/76 125/69 (!) 156/79  Pulse: (!) 120 (!) 110 94   Resp: 20     Temp: 97.7 F (36.5 C)  98.1 F (36.7 C)   TempSrc: Oral  Oral   SpO2: 95% 96% 99%    Weight:      Height:        Intake/Output Summary (Last 24 hours) at 10/10/16 1023 Last data filed at 10/10/16 0700  Gross per 24 hour  Intake                0 ml  Output                0 ml  Net                0 ml   Filed Weights   10/08/16 1828 10/08/16 2310  Weight: 85.7 kg (189 lb) 87.9 kg (193 lb 12.8 oz)    Telemetry    NSR/borderline sinus tach (BB has been held)  Physical Exam   GEN: No acute distress. Anxious appearing. HEENT: Normocephalic, atraumatic, sclera non-icteric. Neck: No JVD or bruits. Cardiac: RRR no murmurs, rubs, or gallops.  Radials/DP/PT 1+ and equal bilaterally.  Respiratory: Clear to auscultation bilaterally. Breathing is unlabored. GI: Soft, nontender, non-distended, BS +x 4. MS: no deformity. Extremities: No clubbing or cyanosis. Mild chronic lower extremity edema, non-pitting. No erythema or palpable cords. Distal pedal pulses are 2+ and equal bilaterally. Neuro:  AAOx3. Follows commands. Psych:  Responds to questions appropriately with an emotional affect.  Labs    Chemistry Recent Labs Lab 10/08/16 1849  NA 140  K 3.6  CL 101  CO2 24  GLUCOSE 125*  BUN 12  CREATININE 0.86  CALCIUM 9.7  GFRNONAA >60  GFRAA >60  ANIONGAP 15     Hematology Recent Labs Lab 10/08/16 1849  WBC 7.5  RBC 4.44  HGB 12.2  HCT 37.9  MCV 85.4  MCH 27.5  MCHC 32.2  RDW 15.0  PLT 222    Cardiac Enzymes Recent Labs Lab 10/08/16 1849 10/09/16 0402 10/09/16 1031  TROPONINI <0.03 <0.03 <0.03   No results for input(s): TROPIPOC in the last 168 hours.   Radiology    Ct Angio Chest Pe W Or Wo Contrast  Result Date: 10/08/2016 CLINICAL DATA:  Chest pain. EXAM: CT ANGIOGRAPHY CHEST WITH CONTRAST TECHNIQUE: Multidetector CT imaging of the chest was performed using the standard protocol during bolus administration of intravenous contrast. Multiplanar CT image reconstructions and MIPs were obtained to evaluate the vascular anatomy. CONTRAST:   100 cc Isovue 370 COMPARISON:  Chest CT dated 10/03/2013. FINDINGS: Cardiovascular: Some of the peripheral segmental and subsegmental pulmonary artery branches are difficult to characterize due to patient breathing motion artifact but there is no pulmonary embolism identified within the main, lobar or central segmental pulmonary arteries bilaterally. Mild ectasia of the ascending thoracic aorta. Descending thoracic aorta is normal in caliber. Aortic atherosclerosis. Mild cardiomegaly. Status post median sternotomy for presumed CABG. Coronary artery calcifications noted. No pericardial effusion. Mediastinum/Nodes: No mass or enlarged lymph nodes within the mediastinum, perihilar or axillary regions. Esophagus appears normal. Trachea and central bronchi are unremarkable. Lungs/Pleura: Stable chronic atelectasis/scarring at the left lung base. Mild emphysematous change at each lung apex. Mild scarring at the left lung apex. No evidence of pneumonia. No pleural effusion or pneumothorax. Upper Abdomen: Liver is diffusely low in density indicating fatty infiltration, incompletely imaged. No acute findings. Musculoskeletal: No acute or suspicious osseous finding. Review of the MIP images confirms the above findings. IMPRESSION: 1. No acute findings. No pulmonary embolism seen, with mild study limitations detailed above. 2. Mild cardiomegaly, stable. Status post median sternotomy for presumed CABG. 3. Aortic atherosclerosis. No aortic aneurysm or evidence of aortic dissection. 4. Mild emphysematous change at each lung apex. Chronic scarring/atelectasis at the left lung base, stable or perhaps slightly improved compared to the previous chest CT. No new lung findings. No evidence of pneumonia or pulmonary edema. 5. Fatty infiltration of the liver. Electronically Signed   By: Franki Cabot M.D.   On: 10/08/2016 22:53   Dg Chest Port 1 View  Result Date: 10/08/2016 CLINICAL DATA:  Chest pain for several days, worsened today.  EXAM: PORTABLE CHEST 1 VIEW COMPARISON:  07/22/16 FINDINGS: Unchanged mild cardiomegaly. The lungs are clear. The pulmonary vasculature is normal. No pleural effusions. Hilar and mediastinal contours are unremarkable and unchanged. Right jugular port appears satisfactorily positioned with tip at the cavoatrial junction. IMPRESSION: No active disease. Electronically Signed   By: Andreas Newport M.D.   On: 10/08/2016 19:51    Cardiac Studies   ECHO: 10/09/2016 - Left ventricle: The cavity size was normal. Wall thickness was increased in a pattern of mild LVH. Systolic function was normal. The estimated ejection fraction was in the range of 55% to 60%. Basal-mid inferior hypokinesis. Doppler parameters are consistent with abnormal left ventricular relaxation (grade 1 diastolic dysfunction). - Aortic valve: There was no stenosis. There was trivial regurgitation. - Mitral valve: There was mild regurgitation. - Left atrium: The atrium was mildly dilated. - Right ventricle: The cavity size was normal. Systolic function was normal. - Tricuspid valve: Peak RV-RA gradient (S): 19  mm Hg. - Pulmonary arteries: PA peak pressure: 22 mm Hg (S). - Inferior vena cava: The vessel was normal in size. The respirophasic diameter changes were in the normal range (>= 50%), consistent with normal central venous pressure.  Impressions:  - Normal LV size with EF 55-60%. Mild LV hypertrophy. Basal to mid inferior hypokinesis. Normal RV size and systolic function. No significant valvular abnormalities  Patient Profile     This is a 62 y.o. female with a past medical history significant for CAD (s/p CABGx2 in 2016), HTN, renal cell carcinoma, recent atypical chest pain who presented to [facility] with recurrent atypical chest pain, r/o for MI.  Assessment & Plan    1. Atypical chest pain. Symptoms similar to previous angina but are not exertional in nature. Improving with long acting  nitrates. She does not wish to undergo repeat cardiac cath at this time. Low risk ECG, Echo and negative cardiac enzymes. Will perform treadmill stress test today. Beta blocker has been held since admission in anticipation of stress test. Will write to resume BB after nuc today. Note the patient is extremely anxious about cardiac testing in general including stress testing given her prior experiences. As previously stated, could consider initiation of Ranexa. CTA has ruled out PE.  2. HTN - resume BB after test.  3. Anxiety - may benefit from SSRI; will defer to IM.  Dayna Dunn PA-C (patient seen/examined with PA student, note formulated per our discussion, personally addended) 10/10/2016  10:58 AM   I have personally seen and examined this patient with Melina Copa, PA-C. I agree with the assessment and plan as outlined above. She has known CAD with prior bypass of the RCA in April 2016. She has been having chest pain for several weeks. This is mostly at rest and seems atypical. She was seen in the office by Dr. Burt Knack 09/29/16 and refused to consider a stress test or a cardiac cath. Troponin is negative. Echo with normal LV systolic function. EKG unchanged. Stress test today is pending. If her stress test is abnormal, she will need a cardiac cath.   Lauree Chandler 10/10/2016 2:14 PM

## 2016-10-10 NOTE — Progress Notes (Addendum)
Stress test result reviewed with Dr. Angelena Form. Patient felt to require definitive cath. Long discussion with patient and husband over the phone about procedure. Risks and benefits of cardiac catheterization have been discussed with the patient. These include bleeding, infection, kidney damage, stroke, heart attack, death. The patient understands these risks and is willing to proceed. Initially this was scheduled with Dr. Irish Lack. However the patient became extremely upset and anxious and insisted that only Dr. Burt Knack perform the procedure. I was able to get in touch with Dr. Burt Knack who is willing to perform this tomorrow. Cath lab aware. Patient and husband very appreciative. Per discussion with Dr. Burt Knack will load with Plavix '300mg'$  today then start '75mg'$  tomorrow. Earnesteen Birnie PA-C

## 2016-10-10 NOTE — Progress Notes (Signed)
   Sheri Hernandez presented for a nuclear stress test today.  No immediate complications. Patient had somewhat eccentric affect during study. Stress imaging is pending at this time.  Preliminary EKG findings may be listed in the chart, but the stress test result will not be finalized until perfusion imaging is complete.  Charlie Pitter, PA-C 10/10/2016, 2:15 PM

## 2016-10-11 ENCOUNTER — Encounter (HOSPITAL_COMMUNITY)
Admission: EM | Disposition: A | Discharge status: Home / Self Care | Payer: Self-pay | Source: Home / Self Care | Attending: Emergency Medicine

## 2016-10-11 DIAGNOSIS — I200 Unstable angina: Secondary | ICD-10-CM | POA: Diagnosis not present

## 2016-10-11 DIAGNOSIS — I25110 Atherosclerotic heart disease of native coronary artery with unstable angina pectoris: Secondary | ICD-10-CM

## 2016-10-11 DIAGNOSIS — I25700 Atherosclerosis of coronary artery bypass graft(s), unspecified, with unstable angina pectoris: Secondary | ICD-10-CM | POA: Diagnosis not present

## 2016-10-11 DIAGNOSIS — R079 Chest pain, unspecified: Secondary | ICD-10-CM | POA: Diagnosis not present

## 2016-10-11 DIAGNOSIS — C649 Malignant neoplasm of unspecified kidney, except renal pelvis: Secondary | ICD-10-CM | POA: Diagnosis not present

## 2016-10-11 DIAGNOSIS — I10 Essential (primary) hypertension: Secondary | ICD-10-CM | POA: Diagnosis not present

## 2016-10-11 DIAGNOSIS — R55 Syncope and collapse: Secondary | ICD-10-CM | POA: Diagnosis not present

## 2016-10-11 DIAGNOSIS — F419 Anxiety disorder, unspecified: Secondary | ICD-10-CM | POA: Diagnosis not present

## 2016-10-11 DIAGNOSIS — E032 Hypothyroidism due to medicaments and other exogenous substances: Secondary | ICD-10-CM | POA: Diagnosis not present

## 2016-10-11 HISTORY — PX: LEFT HEART CATH AND CORS/GRAFTS ANGIOGRAPHY: CATH118250

## 2016-10-11 LAB — CBC
HEMATOCRIT: 39.8 % (ref 36.0–46.0)
HEMOGLOBIN: 12.9 g/dL (ref 12.0–15.0)
MCH: 27.9 pg (ref 26.0–34.0)
MCHC: 32.4 g/dL (ref 30.0–36.0)
MCV: 86.1 fL (ref 78.0–100.0)
PLATELETS: 220 10*3/uL (ref 150–400)
RBC: 4.62 MIL/uL (ref 3.87–5.11)
RDW: 15.4 % (ref 11.5–15.5)
WBC: 8.4 10*3/uL (ref 4.0–10.5)

## 2016-10-11 LAB — BASIC METABOLIC PANEL
ANION GAP: 13 (ref 5–15)
BUN: 17 mg/dL (ref 6–20)
CHLORIDE: 103 mmol/L (ref 101–111)
CO2: 24 mmol/L (ref 22–32)
Calcium: 9.7 mg/dL (ref 8.9–10.3)
Creatinine, Ser: 0.95 mg/dL (ref 0.44–1.00)
GFR calc Af Amer: >60 mL/min (ref >60)
GLUCOSE: 127 mg/dL — AB (ref 65–99)
POTASSIUM: 4 mmol/L (ref 3.5–5.1)
Sodium: 140 mmol/L (ref 135–145)

## 2016-10-11 LAB — PROTIME-INR
INR: 1.02
Prothrombin Time: 13.4 seconds (ref 11.4–15.2)

## 2016-10-11 SURGERY — LEFT HEART CATH AND CORS/GRAFTS ANGIOGRAPHY

## 2016-10-11 MED ORDER — ASPIRIN 81 MG PO CHEW: 81.0000 mg | CHEWABLE_TABLET | ORAL | Status: DC

## 2016-10-11 MED ORDER — MIDAZOLAM HCL 2 MG/2ML IJ SOLN
INTRAMUSCULAR | Status: DC | PRN
  Administered 2016-10-11: 2 mg via INTRAVENOUS

## 2016-10-11 MED ORDER — LIDOCAINE HCL (PF) 1 % IJ SOLN
INTRAMUSCULAR | Status: DC | PRN
  Administered 2016-10-11: 12 mL

## 2016-10-11 MED ORDER — SODIUM CHLORIDE 0.9 % WEIGHT BASED INFUSION
3.0000 mL/kg/h | INTRAVENOUS | Status: AC
  Administered 2016-10-11: 3 mL/kg/h via INTRAVENOUS

## 2016-10-11 MED ORDER — SODIUM CHLORIDE 0.9% FLUSH: 3.0000 mL | INTRAVENOUS | Status: DC | PRN

## 2016-10-11 MED ORDER — SODIUM CHLORIDE 0.9 % WEIGHT BASED INFUSION
1.0000 mL/kg/h | INTRAVENOUS | Status: DC
  Administered 2016-10-11: 1 mL/kg/h via INTRAVENOUS

## 2016-10-11 MED ORDER — HEPARIN (PORCINE) IN NACL 2-0.9 UNIT/ML-% IJ SOLN
INTRAMUSCULAR | Status: AC
  Filled 2016-10-11: qty 1000

## 2016-10-11 MED ORDER — ASPIRIN 81 MG PO CHEW
81.0000 mg | CHEWABLE_TABLET | ORAL | Status: AC
  Administered 2016-10-11: 81 mg via ORAL
  Filled 2016-10-11: qty 1

## 2016-10-11 MED ORDER — FENTANYL CITRATE (PF) 100 MCG/2ML IJ SOLN
INTRAMUSCULAR | Status: DC | PRN
  Administered 2016-10-11: 50 ug via INTRAVENOUS

## 2016-10-11 MED ORDER — SODIUM CHLORIDE 0.9 % WEIGHT BASED INFUSION: 1.0000 mL/kg/h | INTRAVENOUS | Status: DC

## 2016-10-11 MED ORDER — SODIUM CHLORIDE 0.9% FLUSH
3.0000 mL | Freq: Two times a day (BID) | INTRAVENOUS | Status: DC
  Administered 2016-10-11: 3 mL via INTRAVENOUS

## 2016-10-11 MED ORDER — SODIUM CHLORIDE 0.9 % IV SOLN: INTRAVENOUS | Status: AC

## 2016-10-11 MED ORDER — SODIUM CHLORIDE 0.9 % WEIGHT BASED INFUSION: 3.0000 mL/kg/h | INTRAVENOUS | Status: DC

## 2016-10-11 MED ORDER — OXYCODONE-ACETAMINOPHEN 5-325 MG PO TABS: 1.0000 | ORAL_TABLET | ORAL | Status: DC | PRN

## 2016-10-11 MED ORDER — IOPAMIDOL (ISOVUE-370) INJECTION 76%
INTRAVENOUS | Status: AC
  Filled 2016-10-11: qty 100

## 2016-10-11 MED ORDER — SODIUM CHLORIDE 0.9 % IV SOLN: 250.0000 mL | INTRAVENOUS | Status: DC | PRN

## 2016-10-11 MED ORDER — IOPAMIDOL (ISOVUE-370) INJECTION 76%
INTRAVENOUS | Status: DC | PRN
  Administered 2016-10-11: 40 mL via INTRA_ARTERIAL

## 2016-10-11 MED ORDER — FENTANYL CITRATE (PF) 100 MCG/2ML IJ SOLN
INTRAMUSCULAR | Status: AC
  Filled 2016-10-11: qty 2

## 2016-10-11 MED ORDER — HEPARIN (PORCINE) IN NACL 2-0.9 UNIT/ML-% IJ SOLN
INTRAMUSCULAR | Status: DC | PRN
  Administered 2016-10-11: 1000 mL via INTRA_ARTERIAL

## 2016-10-11 MED ORDER — LIDOCAINE HCL (PF) 1 % IJ SOLN
INTRAMUSCULAR | Status: AC
  Filled 2016-10-11: qty 30

## 2016-10-11 MED ORDER — MIDAZOLAM HCL 2 MG/2ML IJ SOLN
INTRAMUSCULAR | Status: AC
  Filled 2016-10-11: qty 2

## 2016-10-11 MED ORDER — IOPAMIDOL (ISOVUE-370) INJECTION 76%
INTRAVENOUS | Status: AC
  Filled 2016-10-11: qty 125

## 2016-10-11 SURGICAL SUPPLY — 6 items
CATH INFINITI 5FR MULTPACK ANG (CATHETERS) ×2 IMPLANT
KIT HEART LEFT (KITS) ×2 IMPLANT
PACK CARDIAC CATHETERIZATION (CUSTOM PROCEDURE TRAY) ×2 IMPLANT
SHEATH PINNACLE 5F 10CM (SHEATH) ×2 IMPLANT
TRANSDUCER W/STOPCOCK (MISCELLANEOUS) ×2 IMPLANT
WIRE EMERALD 3MM-J .035X150CM (WIRE) ×2 IMPLANT

## 2016-10-11 NOTE — H&P (View-Only) (Signed)
Stress test result reviewed with Dr. Angelena Form. Patient felt to require definitive cath. Long discussion with patient and husband over the phone about procedure. Risks and benefits of cardiac catheterization have been discussed with the patient. These include bleeding, infection, kidney damage, stroke, heart attack, death. The patient understands these risks and is willing to proceed. Initially this was scheduled with Dr. Irish Lack. However the patient became extremely upset and anxious and insisted that only Dr. Burt Knack perform the procedure. I was able to get in touch with Dr. Burt Knack who is willing to perform this tomorrow. Cath lab aware. Patient and husband very appreciative. Per discussion with Dr. Burt Knack will load with Plavix '300mg'$  today then start '75mg'$  tomorrow. Dayna Dunn PA-C

## 2016-10-11 NOTE — Progress Notes (Signed)
Progress Note  Patient Name: Sheri Hernandez Date of Encounter: 10/11/2016  Primary Cardiologist: Burt Knack  Subjective   Denies any CP this AM. Noted to be emotionally labile this admission. Nurse states that patient required a 30 minute conversation last night to agree to Plavix. She was also upset when dealing with the cath lab nurse this AM.  She is currently calm.  Inpatient Medications    Scheduled Meds: . amLODipine  2.5 mg Oral Daily  . aspirin EC  81 mg Oral Daily  . atorvastatin  40 mg Oral Daily  . clopidogrel  75 mg Oral Daily  . enoxaparin (LOVENOX) injection  40 mg Subcutaneous Q24H  . furosemide  20 mg Oral Daily  . isosorbide mononitrate  30 mg Oral Daily  . levothyroxine  137 mcg Oral QAC breakfast  . metoprolol tartrate  25 mg Oral BID  . saccharomyces boulardii  250 mg Oral BID  . sodium chloride flush  3 mL Intravenous Q12H   Continuous Infusions: . sodium chloride 1 mL/kg/hr (10/11/16 0657)   PRN Meds: sodium chloride, acetaminophen, cyclobenzaprine, gi cocktail, LORazepam, ondansetron (ZOFRAN) IV, oxyCODONE, sodium chloride flush   Vital Signs    Vitals:   10/10/16 1641 10/10/16 2111 10/11/16 0454 10/11/16 0611  BP: 129/70 134/80 117/62   Pulse: 98 100 87   Resp: '18 18 18   '$ Temp: 98.5 F (36.9 C) 99 F (37.2 C) 98.4 F (36.9 C)   TempSrc: Oral Oral Oral   SpO2: 99% 95% 98%   Weight:    191 lb 8 oz (86.9 kg)  Height:       No intake or output data in the 24 hours ending 10/11/16 0900 Filed Weights   10/08/16 1828 10/08/16 2310 10/11/16 0611  Weight: 189 lb (85.7 kg) 193 lb 12.8 oz (87.9 kg) 191 lb 8 oz (86.9 kg)    Telemetry    NSR, occ PVCs - personally reviewed.  Physical Exam   GEN: No acute distress. HEENT: Normocephalic, atraumatic, sclera non-icteric. Neck: No JVD or bruits. Cardiac: RRR no murmurs, rubs, or gallops.  Radials/DP/PT 1+ and equal bilaterally.  Respiratory: Clear to auscultation bilaterally. Breathing is  unlabored. GI: Soft, nontender, non-distended, BS +x 4. MS: no deformity. Extremities: No clubbing or cyanosis. Mild chronic lower extremity edema, non-pitting. No erythema or palpable cords. Distal pedal pulses are 2+ and equal bilaterally. Neuro:  AAOx3. Follows commands. Psych:  Responds to questions appropriately with a flat affect.  Labs    Chemistry  Recent Labs Lab 10/08/16 1849 10/11/16 0538  NA 140 140  K 3.6 4.0  CL 101 103  CO2 24 24  GLUCOSE 125* 127*  BUN 12 17  CREATININE 0.86 0.95  CALCIUM 9.7 9.7  GFRNONAA >60 >60  GFRAA >60 >60  ANIONGAP 15 13     Hematology  Recent Labs Lab 10/08/16 1849 10/11/16 0538  WBC 7.5 8.4  RBC 4.44 4.62  HGB 12.2 12.9  HCT 37.9 39.8  MCV 85.4 86.1  MCH 27.5 27.9  MCHC 32.2 32.4  RDW 15.0 15.4  PLT 222 220    Cardiac Enzymes  Recent Labs Lab 10/08/16 1849 10/09/16 0402 10/09/16 1031  TROPONINI <0.03 <0.03 <0.03   No results for input(s): TROPIPOC in the last 168 hours.   Radiology    Nm Myocar Multi W/spect W/wall Motion / Ef  Result Date: 10/10/2016 CLINICAL DATA:  Chest pain EXAM: MYOCARDIAL IMAGING WITH SPECT (REST AND EXERCISE) GATED LEFT VENTRICULAR WALL MOTION  STUDY LEFT VENTRICULAR EJECTION FRACTION TECHNIQUE: Standard myocardial SPECT imaging was performed after resting intravenous injection of 10 mCi Tc-109mtetrofosmin. Subsequently, exercise tolerance test was performed by the patient under the supervision of the Cardiology staff. At peak-stress, 30 mCi Tc-985metrofosmin was injected intravenously and standard myocardial SPECT imaging was performed. Quantitative gated imaging was also performed to evaluate left ventricular wall motion, and estimate left ventricular ejection fraction. COMPARISON:  None. FINDINGS: Perfusion: There is decreased activity noted on rest and stress images in the inferolateral wall, worsening somewhat on stress images. This is concerning for inferolateral infarct with mild  peri-infarct ischemia. Wall Motion: Decreased motion inferiorly. Left Ventricular Ejection Fraction: 53 % End diastolic volume 66 ml End systolic volume 31 ml IMPRESSION: 1. Inferolateral defect on rest and stress images, worsening on stress images concerning for area of infarct with peri-infarct ischemia. 2. Decreased inferior wall motion. 3. Left ventricular ejection fraction 53% 4. Non invasive risk stratification*: Intermediate *2012 Appropriate Use Criteria for Coronary Revascularization Focused Update: J Am Coll Cardiol. 204098;11(9):[email or web address] Electronically Signed   By: KeRolm Baptise.D.   On: 10/10/2016 16:19    Cardiac Studies   ECHO: 10/09/2016 - Left ventricle: The cavity size was normal. Wall thickness was increased in a pattern of mild LVH. Systolic function was normal. The estimated ejection fraction was in the range of 55% to 60%. Basal-mid inferior hypokinesis. Doppler parameters are consistent with abnormal left ventricular relaxation (grade 1 diastolic dysfunction). - Aortic valve: There was no stenosis. There was trivial regurgitation. - Mitral valve: There was mild regurgitation. - Left atrium: The atrium was mildly dilated. - Right ventricle: The cavity size was normal. Systolic function was normal. - Tricuspid valve: Peak RV-RA gradient (S): 19 mm Hg. - Pulmonary arteries: PA peak pressure: 22 mm Hg (S). - Inferior vena cava: The vessel was normal in size. The respirophasic diameter changes were in the normal range (>= 50%), consistent with normal central venous pressure.  Impressions:  - Normal LV size with EF 55-60%. Mild LV hypertrophy. Basal to mid inferior hypokinesis. Normal RV size and systolic function. No significant valvular abnormalities  Patient Profile     This is a [age].o. female with a past medical history significant for CAD (s/p CABGx2 in 2016 following unsuccessful  PCI of the RCA with acute vessel closure), HTN, renal cell carcinoma, recent atypical chest pain [facility]/o for MI.  Assessment & Plan    1. Atypical chest pain with history of CAD s/p CABG as above: abnormal nuc. Plan cath for definitive eval. Risks/benefits already reviewed yesterday (see note). She is on the board for today. Note the patient is extremely anxious about cardiac testing in general given her prior experiences. As previously stated, could consider initiation of Ranexa. CTA has ruled out PE.  2. HTN - controlled with resumption of BB.  3. Anxiety - emotionally labile this admission, will defer to IM.   Dayna Dunn PA-C 10/11/2016  9:00 AM   Patient seen, examined. Available data reviewed. Agree with findings, assessment, and plan as outlined by DaMelina CopaPA-C. She is well-known to me. Exam reveals alert, oriented woman in NAD. Lungs CTA, CV: RRR no murmur, extremities without edema. Data reviewed, including labs, radiographic data, and nuclear stress test result. Agree cardiac cath +/- PCI indicated. I have reviewed risks/indications/alternatives with the patient and her husband.   Pt seen this afternoon prior to her cath procedure.  MiLegrand Como  Burt Knack, M.D. 10/11/2016 7:09 PM

## 2016-10-11 NOTE — Progress Notes (Signed)
PROGRESS NOTE    Sheri Hernandez  OZH:[id number] DOB: [birth year] DOA: 10/08/2016 PCP: Odette Fraction, MD    Brief Narrative:  61 y.o. female with a past medical history significant for metastatic renal CA ECOG 1, CAD s/p CABG in 2016, and HTN who presents with chest discomfort and near syncope.  The patient had an episode of CoNS bacteremia within the last month that is resolving.  She was admitted to [facility] for neck pain and abnormal MRI for presumptive discitis and blood cultures grew persistent CoNS.  Intraoperatively however, it was thought she did NOT have discitis and her surgical cultures were negative, so she was put on OPAT vancomycin by [facility], and they salvaged her port with 2 weeks IV vancomycin after her last postive blood culture, finishing therapy on 09/19/16.  No fever, neck pain since then.  Then about 2 weeks ago she developed a new chest pain.  Intermittent, burning, similar to previous angina, but lasting minutes, not exertional, and resolving spontaneously.  She was seen 1/25 by her Cardiologist Dr. Burt Knack, offered medial mgmt, nuc stress or cath, and declined cath strenuously because of previous complications from her cath in 2016. Instead she started Imdur 15 mg (had some brief headaches) and tolerated it well enough to increase the dose to 30 daily about three days ago.  Today, she was at [facility] with family and felt a "twinge" or tightness in her chest, "like a discomfort" followed by a vague malaise, "seeing stars" feeling lightheaded and having some SOB.  She finished her meal, took a hydrocodone, then an oxycodone back-to-back, left and went to an [place] deli where she had "my legs go out from me" and felt again, vague malaise "weird".  She went home, and was debating about whether to cancel her trip to the beach tomorrow, when she had an episode where she stood up, felt lightheaded and again a discomfort in her chest, and so she came tot he ER.  Assessment & Plan:     Principal Problem:   Chest pain Active Problems:   S/P CABG x 2   Drug-induced hypothyroidism   Adenocarcinoma, renal cell (HCC)   Essential hypertension   Near syncope   Anxiety   Unstable angina ([place])  1. Chest pain: - CTA chest neg for PE -Serial troponins neg x 3 -Echocardiogram reviewed with findings of basal-mid inferior hypokinesis not present on 6/17 -CTA chest neg for PE -Cardiology following - Pt is now s/p abnormal stres on 2/5 with findings of inferolateral defect concerning ofr area of infarct with peri-infarct ischemia -Will continue aspirin and statin - Pt is now s/p heart cath on 2/6 with findings of severe 2 vessel CAD with tight stenosis of a small L cx and total occlusion of the prox RCA. Recommendations for medical tx. If med tx fails, then consideration for PCI of prox LCx and CTO-PCI with atherectomy of RCA  2. Hypothyroidism:  -Continue levothyroxine - Stable at this time  3. Hypertension:  -Continue amlodipine, furosemide - BP stable this AM - Will titrate bp meds as tolerated  4. Renal cell cancer:  - On nivolumab at [place] with Dr. Marcello Moores -Will continue oxycodone PRN - Remains stable  5. Other medications:  -Will continue lorazepam PRN   DVT prophylaxis: Lovenox subQ Code Status: Full Family Communication: Pt in room, family not at bedside Disposition Plan: Uncertain at this time  Consultants:   Cardiololgy  Procedures:   Abnormal stress test 2/5  Heart cath 2/6  Antimicrobials: Anti-infectives    None      Subjective: Anxious about heart cath today  Objective: Vitals:   10/11/16 1542 10/11/16 1547 10/11/16 1552 10/11/16 1557  BP: 131/89 133/89 (!) 147/96 (!) 155/108  Pulse: 90 87 87 87  Resp: '20 16 16 15  '$ Temp:      TempSrc:      SpO2: 98% 98% 96% 96%  Weight:      Height:        Intake/Output Summary (Last 24 hours) at 10/11/16 1820 Last data filed at 10/11/16 1720  Gross per 24 hour  Intake                 0 ml  Output              600 ml  Net             -600 ml   Filed Weights   10/08/16 1828 10/08/16 2310 10/11/16 0611  Weight: 85.7 kg (189 lb) 87.9 kg (193 lb 12.8 oz) 86.9 kg (191 lb 8 oz)    Examination:  General exam: sleeping, easily arousable, conversant in nad Respiratory system: normal resp effort, no audible wheezing Cardiovascular system: regular rhythm, s1-2 Gastrointestinal system: soft, nondistended, pos BS Central nervous system: no seizures, no tremors Extremities: perfused, no joint deformities Skin: no rashes, no pallor Psychiatry: affect normal// no auditory halluciations   Data Reviewed: I have personally reviewed following labs and imaging studies  CBC:  Recent Labs Lab 10/08/16 1849 10/11/16 0538  WBC 7.5 8.4  HGB 12.2 12.9  HCT 37.9 39.8  MCV 85.4 86.1  PLT 222 355   Basic Metabolic Panel:  Recent Labs Lab 10/08/16 1849 10/11/16 0538  NA 140 140  K 3.6 4.0  CL 101 103  CO2 24 24  GLUCOSE 125* 127*  BUN 12 17  CREATININE 0.86 0.95  CALCIUM 9.7 9.7   GFR: Estimated Creatinine Clearance: 74.5 mL/min (by C-G formula based on SCr of 0.95 mg/dL). Liver Function Tests: No results for input(s): AST, ALT, ALKPHOS, BILITOT, PROT, ALBUMIN in the last 168 hours. No results for input(s): LIPASE, AMYLASE in the last 168 hours. No results for input(s): AMMONIA in the last 168 hours. Coagulation Profile:  Recent Labs Lab 10/11/16 0538  INR 1.02   Cardiac Enzymes:  Recent Labs Lab 10/08/16 1849 10/09/16 0402 10/09/16 1031  TROPONINI <0.03 <0.03 <0.03   BNP (last 3 results) No results for input(s): PROBNP in the last 8760 hours. HbA1C: No results for input(s): HGBA1C in the last 72 hours. CBG: No results for input(s): GLUCAP in the last 168 hours. Lipid Profile: No results for input(s): CHOL, HDL, LDLCALC, TRIG, CHOLHDL, LDLDIRECT in the last 72 hours. Thyroid Function Tests: No results for input(s): TSH, T4TOTAL, FREET4, T3FREE,  THYROIDAB in the last 72 hours. Anemia Panel: No results for input(s): VITAMINB12, FOLATE, FERRITIN, TIBC, IRON, RETICCTPCT in the last 72 hours. Sepsis Labs: No results for input(s): PROCALCITON, LATICACIDVEN in the last 168 hours.  No results found for this or any previous visit (from the past 240 hour(s)).   Radiology Studies: Nm Myocar Multi W/spect W/wall Motion / Ef  Result Date: 10/10/2016 CLINICAL DATA:  Chest pain EXAM: MYOCARDIAL IMAGING WITH SPECT (REST AND EXERCISE) GATED LEFT VENTRICULAR WALL MOTION STUDY LEFT VENTRICULAR EJECTION FRACTION TECHNIQUE: Standard myocardial SPECT imaging was performed after resting intravenous injection of 10 mCi Tc-82mtetrofosmin. Subsequently, exercise tolerance test was performed by the patient under the supervision of  the Cardiology staff. At peak-stress, 30 mCi Tc-50mtetrofosmin was injected intravenously and standard myocardial SPECT imaging was performed. Quantitative gated imaging was also performed to evaluate left ventricular wall motion, and estimate left ventricular ejection fraction. COMPARISON:  None. FINDINGS: Perfusion: There is decreased activity noted on rest and stress images in the inferolateral wall, worsening somewhat on stress images. This is concerning for inferolateral infarct with mild peri-infarct ischemia. Wall Motion: Decreased motion inferiorly. Left Ventricular Ejection Fraction: 53 % End diastolic volume 66 ml End systolic volume 31 ml IMPRESSION: 1. Inferolateral defect on rest and stress images, worsening on stress images concerning for area of infarct with peri-infarct ischemia. 2. Decreased inferior wall motion. 3. Left ventricular ejection fraction 53% 4. Non invasive risk stratification*: Intermediate *2012 Appropriate Use Criteria for Coronary Revascularization Focused Update: J Am Coll Cardiol. 24431;54(0):086-761 [email or web address] Electronically Signed   By: KRolm BaptiseM.D.    On: 10/10/2016 16:19    Scheduled Meds: . amLODipine  2.5 mg Oral Daily  . aspirin EC  81 mg Oral Daily  . atorvastatin  40 mg Oral Daily  . clopidogrel  75 mg Oral Daily  . enoxaparin (LOVENOX) injection  40 mg Subcutaneous Q24H  . furosemide  20 mg Oral Daily  . isosorbide mononitrate  30 mg Oral Daily  . levothyroxine  137 mcg Oral QAC breakfast  . metoprolol tartrate  25 mg Oral BID  . saccharomyces boulardii  250 mg Oral BID  . sodium chloride flush  3 mL Intravenous Q12H   Continuous Infusions: . sodium chloride 100 mL/hr at 10/11/16 1630     LOS: 0 days   Kodie Kishi, SOrpah Melter MD [facility] Pager [phone] If 7PM-7AM, please contact night-coverage [email or web address] Password [email or web address] 10/11/2016, 6:20 PM

## 2016-10-11 NOTE — Interval H&P Note (Signed)
History and Physical Interval Note:  10/11/2016 3:11 PM  Sheri Hernandez  has presented today for surgery, with the diagnosis of positive stress test  The various methods of treatment have been discussed with the patient and family. After consideration of risks, benefits and other options for treatment, the patient has consented to  Procedure(s): Left Heart Cath and Cors/Grafts Angiography (N/A) as a surgical intervention .  The patient's history has been reviewed, patient examined, no change in status, stable for surgery.  I have reviewed the patient's chart and labs.  Questions were answered to the patient's satisfaction.     Sheri Hernandez

## 2016-10-11 NOTE — Progress Notes (Signed)
Pt received from cath lab. R groin level 0. Pt tearful, complaining of pain and anxiety - given PRN ativan and oxycodone. Pt husband at bedside. Telemetry reapplied. VSS. Pt verbalized understanding of bedrest until 8:30  Fritz Pickerel, RN

## 2016-10-12 ENCOUNTER — Encounter (HOSPITAL_COMMUNITY): Payer: Self-pay | Admitting: Cardiovascular Disease

## 2016-10-12 DIAGNOSIS — R55 Syncope and collapse: Secondary | ICD-10-CM

## 2016-10-12 DIAGNOSIS — C649 Malignant neoplasm of unspecified kidney, except renal pelvis: Secondary | ICD-10-CM

## 2016-10-12 DIAGNOSIS — I10 Essential (primary) hypertension: Secondary | ICD-10-CM

## 2016-10-12 DIAGNOSIS — I200 Unstable angina: Secondary | ICD-10-CM | POA: Diagnosis not present

## 2016-10-12 DIAGNOSIS — E032 Hypothyroidism due to medicaments and other exogenous substances: Secondary | ICD-10-CM

## 2016-10-12 DIAGNOSIS — I25110 Atherosclerotic heart disease of native coronary artery with unstable angina pectoris: Secondary | ICD-10-CM | POA: Diagnosis not present

## 2016-10-12 DIAGNOSIS — R072 Precordial pain: Secondary | ICD-10-CM | POA: Diagnosis not present

## 2016-10-12 DIAGNOSIS — I2510 Atherosclerotic heart disease of native coronary artery without angina pectoris: Secondary | ICD-10-CM

## 2016-10-12 MED ORDER — CLOPIDOGREL BISULFATE 75 MG PO TABS: 75.0000 mg | ORAL_TABLET | Freq: Every day | ORAL | 2 refills | Status: DC

## 2016-10-12 MED ORDER — METOPROLOL TARTRATE 37.5 MG PO TABS: 25.0000 mg | ORAL_TABLET | Freq: Two times a day (BID) | ORAL | 2 refills | Status: DC

## 2016-10-12 NOTE — Discharge Summary (Signed)
Physician Discharge Summary  Sheri Hernandez HYQ:[id number] DOB: 02/20/1956 DOA: 10/08/2016  PCP: Odette Fraction, MD  Admit date: 10/08/2016 Discharge date: 10/12/2016  Admitted From: Home Discharge disposition: Home   Recommendations for Outpatient Follow-Up:   1. Patient will follow-up with cardiologist in 2 weeks. If she fails medical therapy, cardiology to consider atherectomy/PCI. 2. Outpatient cardiac rehabilitation set up.   Discharge Diagnosis:   Principal Problem:    Chest pain Secondary to unstable angina Active Problems:    S/P CABG x 2    Drug-induced hypothyroidism    Adenocarcinoma, renal cell (HCC)    Essential hypertension    Near syncope    Anxiety    Unstable angina [facility])  Discharge Condition: Improved.  Diet recommendation: Low sodium, heart healthy.    History of Present Illness:   61 y.o.femalewith a past medical history significant for metastatic renal CA ECOG 1, CAD s/p CABG in 2016, and HTNwho presented with chest discomfort and near syncope.   Hospital Course by Problem:   Principal Problem:   Chest pain Secondary to unstable angina in a patient with known CAD s/p CABG x 2 CTA negative for PE. Serial troponins negative 3. Evaluated by cardiologist after stress test abnormal with inferolateral defect. Subsequently underwent heart catheterization 10/11/16 with findings of severe 2 vessel CAD with tight stenosis of small left circumflex and total occlusion of the proximal RCA. Medical management recommended with close follow-up with cardiologist. If medical therapy fails, PCI/atherectomy of RCA will be considered. Cardiologist recommends discharge home on increased dose metoprolol, Imdur, Plavix.  Active Problems:   Drug-induced hypothyroidism Continue Synthroid.    Adenocarcinoma, renal cell (HCC) Continue nivolumab at [facility] per Dr. Marcello Moores.    Essential hypertension Continue metoprolol.    Near syncope Likely from unstable  angina.    Anxiety Supportive care.  Medical Consultants:    None.   Discharge Exam:   Vitals:   10/11/16 2131 10/12/16 0434  BP: (!) 141/83 125/64  Pulse: (!) 104 93  Resp: 18 20  Temp: 98.7 F (37.1 C) 98.5 F (36.9 C)   Vitals:   10/11/16 1552 10/11/16 1557 10/11/16 2131 10/12/16 0434  BP: (!) 147/96 (!) 155/108 (!) 141/83 125/64  Pulse: 87 87 (!) 104 93  Resp: '16 15 18 20  '$ Temp:   98.7 F (37.1 C) 98.5 F (36.9 C)  TempSrc:   Oral Oral  SpO2: 96% 96% 96% 97%  Weight:      Height:        General exam: Appears Mildly anxious.  Respiratory system: Clear to auscultation. Respiratory effort normal. Cardiovascular system: S1 & S2 heard, RRR. No JVD,  rubs, gallops or clicks. No murmurs. Gastrointestinal system: Abdomen is nondistended, soft and nontender. No organomegaly or masses felt. Normal bowel sounds heard. Central nervous system: Alert and oriented. No focal neurological deficits. Extremities: No clubbing,  or cyanosis. No edema. Skin: No rashes, lesions or ulcers. Mid sternal scar. Psychiatry: Judgement and insight appear normal. Mood & affect anxious.    The results of significant diagnostics from this hospitalization (including imaging, microbiology, ancillary and laboratory) are listed below for reference.     Procedures and Diagnostic Studies:   Ct Angio Chest Pe W Or Wo Contrast  Result Date: 10/08/2016 CLINICAL DATA:  Chest pain. EXAM: CT ANGIOGRAPHY CHEST WITH CONTRAST TECHNIQUE: Multidetector CT imaging of the chest was performed using the standard protocol during bolus administration of intravenous contrast. Multiplanar CT image reconstructions and MIPs were obtained to  evaluate the vascular anatomy. CONTRAST:  100 cc Isovue 370 COMPARISON:  Chest CT dated 10/03/2013. FINDINGS: Cardiovascular: Some of the peripheral segmental and subsegmental pulmonary artery branches are difficult to characterize due to patient breathing motion artifact but there is  no pulmonary embolism identified within the main, lobar or central segmental pulmonary arteries bilaterally. Mild ectasia of the ascending thoracic aorta. Descending thoracic aorta is normal in caliber. Aortic atherosclerosis. Mild cardiomegaly. Status post median sternotomy for presumed CABG. Coronary artery calcifications noted. No pericardial effusion. Mediastinum/Nodes: No mass or enlarged lymph nodes within the mediastinum, perihilar or axillary regions. Esophagus appears normal. Trachea and central bronchi are unremarkable. Lungs/Pleura: Stable chronic atelectasis/scarring at the left lung base. Mild emphysematous change at each lung apex. Mild scarring at the left lung apex. No evidence of pneumonia. No pleural effusion or pneumothorax. Upper Abdomen: Liver is diffusely low in density indicating fatty infiltration, incompletely imaged. No acute findings. Musculoskeletal: No acute or suspicious osseous finding. Review of the MIP images confirms the above findings. IMPRESSION: 1. No acute findings. No pulmonary embolism seen, with mild study limitations detailed above. 2. Mild cardiomegaly, stable. Status post median sternotomy for presumed CABG. 3. Aortic atherosclerosis. No aortic aneurysm or evidence of aortic dissection. 4. Mild emphysematous change at each lung apex. Chronic scarring/atelectasis at the left lung base, stable or perhaps slightly improved compared to the previous chest CT. No new lung findings. No evidence of pneumonia or pulmonary edema. 5. Fatty infiltration of the liver. Electronically Signed   By: Franki Cabot M.D.   On: 10/08/2016 22:53   Dg Chest Port 1 View  Result Date: 10/08/2016 CLINICAL DATA:  Chest pain for several days, worsened today. EXAM: PORTABLE CHEST 1 VIEW COMPARISON:  07/22/16 FINDINGS: Unchanged mild cardiomegaly. The lungs are clear. The pulmonary vasculature is normal. No pleural effusions. Hilar and mediastinal contours are unremarkable and unchanged. Right  jugular port appears satisfactorily positioned with tip at the cavoatrial junction. IMPRESSION: No active disease. Electronically Signed   By: Andreas Newport M.D.   On: 10/08/2016 19:51     Labs:   Basic Metabolic Panel:  Recent Labs Lab 10/08/16 1849 10/11/16 0538  NA 140 140  K 3.6 4.0  CL 101 103  CO2 24 24  GLUCOSE 125* 127*  BUN 12 17  CREATININE 0.86 0.95  CALCIUM 9.7 9.7   GFR Estimated Creatinine Clearance: 74.5 mL/min (by C-G formula based on SCr of 0.95 mg/dL). Liver Function Tests: No results for input(s): AST, ALT, ALKPHOS, BILITOT, PROT, ALBUMIN in the last 168 hours. No results for input(s): LIPASE, AMYLASE in the last 168 hours. No results for input(s): AMMONIA in the last 168 hours. Coagulation profile  Recent Labs Lab 10/11/16 0538  INR 1.02    CBC:  Recent Labs Lab 10/08/16 1849 10/11/16 0538  WBC 7.5 8.4  HGB 12.2 12.9  HCT 37.9 39.8  MCV 85.4 86.1  PLT 222 220   Cardiac Enzymes:  Recent Labs Lab 10/08/16 1849 10/09/16 0402 10/09/16 1031  TROPONINI <0.03 <0.03 <0.03   BNP: Invalid input(s): POCBNP CBG: No results for input(s): GLUCAP in the last 168 hours. D-Dimer No results for input(s): DDIMER in the last 72 hours. Hgb A1c No results for input(s): HGBA1C in the last 72 hours. Lipid Profile No results for input(s): CHOL, HDL, LDLCALC, TRIG, CHOLHDL, LDLDIRECT in the last 72 hours. Thyroid function studies No results for input(s): TSH, T4TOTAL, T3FREE, THYROIDAB in the last 72 hours.  Invalid input(s): FREET3 Anemia work  up No results for input(s): VITAMINB12, FOLATE, FERRITIN, TIBC, IRON, RETICCTPCT in the last 72 hours. Microbiology No results found for this or any previous visit (from the past 240 hour(s)).   Discharge Instructions:   Discharge Instructions    AMB Referral to Phase II Cardiac Rehab    Complete by:  As directed    Diagnosis:  Stable Angina   Call MD for:  extreme fatigue    Complete by:  As  directed    Call MD for:  persistant dizziness or light-headedness    Complete by:  As directed    Call MD for:  persistant nausea and vomiting    Complete by:  As directed    Call MD for:  severe uncontrolled pain    Complete by:  As directed    Diet - low sodium heart healthy    Complete by:  As directed    Increase activity slowly    Complete by:  As directed      Allergies as of 10/12/2016      Reactions   Silver Dermatitis   Please use mepalex      Medication List    TAKE these medications   amLODipine 5 MG tablet Commonly known as:  NORVASC Take 2.5 mg by mouth daily.   aspirin 81 MG EC tablet Take 1 tablet (81 mg total) by mouth daily.   atorvastatin 40 MG tablet Commonly known as:  LIPITOR TAKE 1 TABLET (40 MG TOTAL) BY MOUTH DAILY.   clopidogrel 75 MG tablet Commonly known as:  PLAVIX Take 1 tablet (75 mg total) by mouth daily. Start taking on:  10/13/2016   cyclobenzaprine 10 MG tablet Commonly known as:  FLEXERIL Take 1 tablet (10 mg total) by mouth 3 (three) times daily as needed for muscle spasms.   docusate sodium 100 MG capsule Commonly known as:  COLACE Take 100 mg by mouth 2 (two) times daily as needed (constipation).   furosemide 20 MG tablet Commonly known as:  LASIX Take 1 tablet (20 mg total) by mouth daily.   HYDROcodone-acetaminophen 5-325 MG tablet Commonly known as:  NORCO/VICODIN Take 1 tablet by mouth every 4 (four) hours as needed for moderate pain.   isosorbide mononitrate 30 MG 24 hr tablet Commonly known as:  IMDUR Take 1 tablet (30 mg total) by mouth daily.   levothyroxine 137 MCG tablet Commonly known as:  SYNTHROID, LEVOTHROID TAKE 1 TABLET (137 MCG TOTAL) BY MOUTH DAILY BEFORE BREAKFAST.   lidocaine-prilocaine cream Commonly known as:  EMLA Apply 1 application topically as directed. Apply cream to portacath site approximately 1 hour before access and cover   LORazepam 1 MG tablet Commonly known as:  ATIVAN Take 1 mg  by mouth every 8 (eight) hours as needed for anxiety.   Metoprolol Tartrate 37.5 MG Tabs Take 25 mg by mouth 2 (two) times daily. What changed:  medication strength   nitroGLYCERIN 0.4 MG SL tablet Commonly known as:  NITROSTAT Place 1 tablet (0.4 mg total) under the tongue every 5 (five) minutes as needed for chest pain.   oxycodone 5 MG capsule Commonly known as:  OXY-IR Take 5 mg by mouth every 6 (six) hours as needed. pain   prochlorperazine 10 MG tablet Commonly known as:  COMPAZINE Take 10 mg by mouth every 6 (six) hours as needed. for nausea   senna-docusate 8.6-50 MG tablet Commonly known as:  Senokot-S Take 1 tablet by mouth 2 (two) times daily as needed. Constipation  Follow-up Information    Sherren Mocha, MD. Schedule an appointment as soon as possible for a visit in 2 week(s).   Specialty:  Cardiology Contact information: [address] [name] [address] [phone]            Time coordinating discharge: 35 minutes.  Signed:  Nicholl Onstott  Pager [phone] [facility] 10/12/2016, 10:08 AM

## 2016-10-12 NOTE — Progress Notes (Signed)
CARDIAC REHAB PHASE I   PRE:  Rate/Rhythm: 84 SR    BP: sitting 139/82    SaO2: 96 RA  MODE:  Ambulation: 350 ft   POST:  Rate/Rhythm: 92 SR    BP: sitting 144/77     SaO2: 96 RA  Pt able to walk without CP. Ed completed with pt, voicing understanding. She is interested in [facility] and I will send referral to [place]. Also gave pt guidelines for walking daily and NTG.  [id number]   [place], ACSM 10/12/2016 11:19 AM

## 2016-10-12 NOTE — Progress Notes (Signed)
Order received to discharge patient.  Telemetry monitor removed and CCMD notified.  PIV access removed.  Discharge instructions, follow up, medications and instructions for their use were discussed with patient. 

## 2016-10-12 NOTE — Discharge Instructions (Signed)

## 2016-10-12 NOTE — Progress Notes (Addendum)
    Subjective:  No CP overnight, no dypsnea.   Objective:  Vital Signs in the last 24 hours: Temp:  [98.5 F (36.9 C)-98.7 F (37.1 C)] 98.5 F (36.9 C) (02/07 0434) Pulse Rate:  [0-156] 93 (02/07 0434) Resp:  [0-20] 20 (02/07 0434) BP: (114-155)/(64-108) 125/64 (02/07 0434) SpO2:  [0 %-100 %] 97 % (02/07 0434)  Intake/Output from previous day: 02/06 0701 - 02/07 0700 In: 0  Out: 600 [Urine:600]  Physical Exam: Pt is alert and oriented, NAD HEENT: normal Neck: JVP - normal, carotids 2+= without bruits Lungs: CTA bilaterally CV: RRR without murmur or gallop Abd: soft, NT, Positive BS, no hepatomegaly Ext: no C/C/E, distal pulses intact and equal, right groin clear Skin: warm/dry no rash   Lab Results:  Recent Labs  10/11/16 0538  WBC 8.4  HGB 12.9  PLT 220    Recent Labs  10/11/16 0538  NA 140  K 4.0  CL 103  CO2 24  GLUCOSE 127*  BUN 17  CREATININE 0.95    Recent Labs  10/09/16 1031  TROPONINI <0.03    Tele: Sinus rhythm, sinus tach  Assessment/Plan:  1. Unstable angina: cath study reviewed from yesterday and discussed at length with patient this am. Plan medical therapy. Continue Imdur at current dose. Increase metoprolol to 37.5 mg BID, add plavix 75 mg daily. Will arrange FU 2 weeks. If she fails med Rx consider atherectomy/PCI  2. HTN: increase metoprolol. BP controlled.  3. Anxiety: major issue. She is reassured today regarding plan for medical therapy for CAD. She would benefit from outpatient cardiac rehab. We discussed this and she seems willing to give it a try.    Sherren Mocha, M.D. 10/12/2016, 8:15 AM

## 2016-10-13 DIAGNOSIS — I25119 Atherosclerotic heart disease of native coronary artery with unspecified angina pectoris: Secondary | ICD-10-CM | POA: Diagnosis not present

## 2016-10-13 DIAGNOSIS — Z5112 Encounter for antineoplastic immunotherapy: Secondary | ICD-10-CM | POA: Diagnosis not present

## 2016-10-13 DIAGNOSIS — C642 Malignant neoplasm of left kidney, except renal pelvis: Secondary | ICD-10-CM | POA: Diagnosis not present

## 2016-10-17 ENCOUNTER — Telehealth: Payer: Self-pay | Admitting: *Deleted

## 2016-10-17 DIAGNOSIS — I25118 Atherosclerotic heart disease of native coronary artery with other forms of angina pectoris: Secondary | ICD-10-CM

## 2016-10-17 MED ORDER — METOPROLOL TARTRATE 25 MG PO TABS: ORAL_TABLET | ORAL | 3 refills | Status: DC

## 2016-10-17 NOTE — Telephone Encounter (Signed)
I reviewed the pt's chart and per Dr Antionette Char last progress note during hospitalization the pt should be taking Metoprolol tartrate '25mg'$  take one and one-half tablet by mouth twice a day. I have sent a new Rx to the pharmacy through [facility].

## 2016-10-18 ENCOUNTER — Telehealth: Payer: Self-pay | Admitting: Cardiovascular Disease

## 2016-10-18 MED ORDER — METOPROLOL TARTRATE 50 MG PO TABS: 50.0000 mg | ORAL_TABLET | Freq: Two times a day (BID) | ORAL | 2 refills | Status: DC

## 2016-10-18 NOTE — Telephone Encounter (Signed)
New message      Pt c/o of Chest Pain: STAT if CP now or developed within 24 hours  1. Are you having CP right now? No----had cp this am at 2  2. Are you experiencing any other symptoms (ex. SOB, nausea, vomiting, sweating)?  Left arm pain  3. How long have you been experiencing CP?   4. Is your CP continuous or coming and going?    5. Have you taken Nitroglycerin?  Pt took 2 nitro around 2am----waited about 55mn between each nitro. Pt had a cath 1 week ago ?

## 2016-10-18 NOTE — Telephone Encounter (Signed)
Received call transferred directly from operator and spoke with pt. She reports she woke up around 2 AM this morning. Shortly after waking felt a grabbing pain under left arm from armpit to elbow. Also had uncomfortable pressure feeling in chest. Relieved with 2 NTG. This is the first time she has used NTG since discharge on 2/7. Also complains of having pinching feelings in chest for the last couple of days.  These have gone away on their own.  At the current time she is feeling OK and not having any pain. Will review with provider in office.

## 2016-10-18 NOTE — Telephone Encounter (Signed)
Reviewed with Dr. Acie Fredrickson and if heart rate above 60 pt can increase lopressor to 50 mg twice daily.  Move follow up to sooner.  I spoke with pt and she checked vital signs while on phone with me.  BP 169/85 and pulse 79.   She has not taken AM meds yet today.  Last evening she was 125/67 and heart rate 82.   I gave her instructions from Dr. Acie Fredrickson to increase lopressor to 50 mg twice daily. Will send new prescription to [facility] on [address].   I scheduled her to see Dr. Burt Knack on 10/26/16 at 10:15. I asked pt to contact us if problems before this appt.

## 2016-10-26 ENCOUNTER — Ambulatory Visit (INDEPENDENT_AMBULATORY_CARE_PROVIDER_SITE_OTHER): Payer: 59 | Admitting: Cardiovascular Disease

## 2016-10-26 VITALS — BP 122/80 | HR 76 | Ht 68.0 in | Wt 193.8 lb

## 2016-10-26 DIAGNOSIS — I25118 Atherosclerotic heart disease of native coronary artery with other forms of angina pectoris: Secondary | ICD-10-CM | POA: Diagnosis not present

## 2016-10-26 DIAGNOSIS — E785 Hyperlipidemia, unspecified: Secondary | ICD-10-CM | POA: Diagnosis not present

## 2016-10-26 MED ORDER — METOPROLOL TARTRATE 50 MG PO TABS: ORAL_TABLET | ORAL | 2 refills | Status: DC

## 2016-10-26 NOTE — Progress Notes (Signed)
Cardiology Office Note Date:  10/26/2016   ID:  [name], DOB [birth year], MRN [id number]  PCP:  Odette Fraction, MD  Cardiologist:  Sherren Mocha, MD    Chief Complaint  Patient presents with  . Coronary Artery Disease    post hospital follow up     History of Present Illness: Sheri Hernandez is a 61 y.o. female who presents for follow-up of coronary artery disease and chronic angina.  She presented with non-ST elevation infarction in 2016 andunderwent complex PCI complicated by coronary dissection any acute inferior STEMI. She was taken emergently for CABG and underwent saphenous vein graft bypass to the right PDA and PLA branches. She recently was seen with progressive angina and underwent cath demonstrating occlusion of the SVG-PDA and subtotal occlusion of the native RCA with left-to-right collaterals. Initial medical therapy was recommended and she presents today for follow-up evaluation.   The patient is here alone today. She continues to have episodic chest pain in the left chest and sometimes radiating to the left arm. There is no clear association with physical exertion. She's taken a few nitroglycerin since hospital discharge. Overall her symptoms are significantly improved with an increase in her metoprolol dose. She denies shortness of breath, edema, heart palpitations, lightheadedness, or syncope.   Past Medical History:  Diagnosis Date  . Anxiety   . Asthma   . CAD (coronary artery disease)    a. 12/2014 Cath: LM nl, LAD 20p, 46m D2 20ost, LCX 95p, 882mRCA dominant, 95p, 8042m RPDA 95, RPL2 50, attempted RCA PCI w/ acute RCA occlusion-->CABG x 2 VG->RPDA->PLVB.  . [facility]  metastatic renal cell carcinoma (biopsy of left renal mass 10/2012) [facility] Complication of anesthesia    hard time waking up 20 yrs. ago when having wisdon teeth removed  . Hypertension   . Lung mass    LUNG MASSES--COUGH-BIOPSY NON-DIAGNOSTIC.  HX OF  MEDIASTINAL MASS AND LUNG MASSES AGE 44 -NEGATIVE BIOSPY-BUT GIVEN DIAGNOSIS OF SARCOIDOSIS.  WORK UP CONTINUING ON DIAGNOSIS FOR PT'S LUNG MASSES--SHE HAS BIOPSY PROVEN KIDNEY CANCER  . Myocardial infarction    per patient  . 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  . Papilloma of breast 2012   left. lumpectomy  . S/p nephrectomy    left, due to metastatic RCC, 3/14  . Shortness of breath    WITH EXERTION    Past Surgical History:  Procedure Laterality Date  . BREAST BIOPSY  08/18/2011   Procedure: BREAST BIOPSY WITH NEEDLE LOCALIZATION;  Surgeon: PauMerrie RoofD;  [facility];  Laterality: Left;  left breast biopsy with needle localization  . BRONCHIAL BRUSH BIOPSY    . CORONARY ARTERY BYPASS GRAFT N/A 01/02/2015   Procedure: CORONARY ARTERY BYPASS GRAFTING (CABG)TIMES TWO USING RIGHT GREATER SAPHENOUS LEG VEIN HARVESTED ENDOSCOPICALLY;  Surgeon: EdwGrace IsaacD;  [facility] [place];  Laterality: N/A;  . LAPAROSCOPIC NEPHRECTOMY Left 11/22/2012   Procedure: LAPAROSCOPIC NEPHRECTOMY;  Surgeon: LesDutch GrayD;  Location: [facility];  Service: Urology;  Laterality: Left;  . LEFT HEART CATH AND CORS/GRAFTS ANGIOGRAPHY N/A 10/11/2016   Procedure: Left Heart Cath and Cors/Grafts Angiography;  Surgeon: MicLegrand Como  Burt Knack, MD;  Location: [facility];  Service: Cardiovascular;  Laterality: N/A;  . LEFT HEART CATHETERIZATION WITH CORONARY ANGIOGRAM N/A 01/02/2015   Procedure: LEFT HEART CATHETERIZATION WITH CORONARY ANGIOGRAM;  Surgeon: Leonie Man, MD;  Location: [facility] CATH LAB;  Service: Cardiovascular;  Laterality: N/A;  . NECK LESION BIOPSY    . TONSILLECTOMY  1967  . VIDEO BRONCHOSCOPY  10/11/2012   Procedure: VIDEO BRONCHOSCOPY WITH  FLUORO;  Surgeon: Kathee Delton, MD;  Location: [facility];  Service: Cardiopulmonary;  Laterality: Bilateral;  . WIDS    . WISDOM TOOTH EXTRACTION      Current Outpatient Prescriptions  Medication Sig Dispense Refill  . amLODipine (NORVASC) 5 MG tablet Take 2.5 mg by mouth daily.    Marland Kitchen aspirin 81 MG EC tablet Take 1 tablet (81 mg total) by mouth daily.    Marland Kitchen atorvastatin (LIPITOR) 40 MG tablet TAKE 1 TABLET (40 MG TOTAL) BY MOUTH DAILY. 90 tablet 0  . clopidogrel (PLAVIX) 75 MG tablet Take 1 tablet (75 mg total) by mouth daily. 30 tablet 2  . cyclobenzaprine (FLEXERIL) 10 MG tablet Take 1 tablet (10 mg total) by mouth 3 (three) times daily as needed for muscle spasms. 30 tablet 1  . docusate sodium (COLACE) 100 MG capsule Take 100 mg by mouth 2 (two) times daily as needed (constipation).     . furosemide (LASIX) 20 MG tablet Take 1 tablet (20 mg total) by mouth daily. 30 tablet 11  . HYDROcodone-acetaminophen (NORCO/VICODIN) 5-325 MG tablet Take 1 tablet by mouth every 4 (four) hours as needed for moderate pain. 20 tablet 0  . isosorbide mononitrate (IMDUR) 30 MG 24 hr tablet Take 1 tablet (30 mg total) by mouth daily. 30 tablet 3  . levothyroxine (SYNTHROID, LEVOTHROID) 137 MCG tablet TAKE 1 TABLET (137 MCG TOTAL) BY MOUTH DAILY BEFORE BREAKFAST. 30 tablet 5  . lidocaine-prilocaine (EMLA) cream Apply 1 application topically as directed. Apply cream to portacath site approximately 1 hour before access and cover  3  . LORazepam (ATIVAN) 1 MG tablet Take 1 mg by mouth every 8 (eight) hours as needed for anxiety.     . metoprolol tartrate (LOPRESSOR) 50 MG tablet Take 1 tablet (50 mg total) by mouth 2 (two) times daily. 180 tablet 2  . nitroGLYCERIN (NITROSTAT) 0.4 MG SL tablet Place 1 tablet (0.4 mg total) under the tongue every 5 (five) minutes as needed for chest pain. 25 tablet 1  . oxycodone (OXY-IR) 5 MG capsule Take 5 mg by mouth every 6 (six) hours as needed. pain    . prochlorperazine  (COMPAZINE) 10 MG tablet Take 10 mg by mouth every 6 (six) hours as needed. for nausea  3  . senna-docusate (SENOKOT-S) 8.6-50 MG tablet Take 1 tablet by mouth 2 (two) times daily as needed. Constipation     No current facility-administered medications for this visit.     Allergies:   Silver   Social History:  The patient  reports that she quit smoking about 4 years ago. Her smoking use included Cigarettes. She has a 60.00 pack-year smoking history. She has never used smokeless tobacco. She reports that she does not drink alcohol or use drugs.   Family History:  The patient's  family history includes Aortic dissection in her brother; Breast cancer in her maternal aunt; Cancer in her maternal grandfather; Colon cancer in her maternal aunt; Heart attack in her father; Heart disease in her brother, father, maternal grandmother, and mother; Skin  cancer in her maternal uncle; Stomach cancer in her maternal uncle; Stroke in her brother.    ROS:  Please see the history of present illness. All other systems are reviewed and negative.    PHYSICAL EXAM: VS:  There were no vitals taken for this visit. , BMI There is no height or weight on file to calculate BMI. GEN: Well nourished, well developed, in no acute distress  HEENT: normal  Neck: no JVD, no masses. No carotid bruits Cardiac: RRR without murmur or gallop                Respiratory:  clear to auscultation bilaterally, normal work of breathing GI: soft, nontender, nondistended, + BS MS: no deformity or atrophy  Ext: no pretibial edema, pedal pulses 2+= bilaterally Skin: warm and dry, no rash Neuro:  Strength and sensation are intact Psych: euthymic mood, full affect  EKG:  EKG is ordered today. The ekg ordered today shows NSR, age-indeterminate inferior infarct  Recent Labs: 10/11/2016: BUN 17; Creatinine, Ser 0.95; Hemoglobin 12.9; Platelets 220; Potassium 4.0; Sodium 140   Lipid Panel     Component Value Date/Time   CHOL 90  01/03/2015 0210   TRIG 77 01/03/2015 0210   HDL 15 (L) 01/03/2015 0210   CHOLHDL 6.0 01/03/2015 0210   VLDL 15 01/03/2015 0210   LDLCALC 60 01/03/2015 0210      Wt Readings from Last 3 Encounters:  10/11/16 191 lb 8 oz (86.9 kg)  09/29/16 189 lb 12.8 oz (86.1 kg)  08/22/16 191 lb 6.4 oz (86.8 kg)     Cardiac Studies Reviewed: Cardiac Cath: Conclusion   1. Severe 2 vessel CAD with tight stenosis of a small left circumflex and total occlusion of the proximal RCA (large dominant vessel) 2. Nonobstructive left main and LAD stenosis 3. Normal LV function by noninvasive assessment 4. Heavily calcified coronary arteries  Plan:  Initial medical therapy (negative enzymes this admission)  If fails medical therapy, consider PCI of the proximal LCx and CTO-PCI with atherectomy of the RCA  OK to discharge home tomorrow if no complications arise  Indications   Unstable angina (HCC) [I20.0 (ICD-10-CM)]  Procedural Details/Technique   Technical Details INDICATION: Chest pain c/w unstable angina pectoris, abnormal nuclear stress test. 61 yo woman with severe CAD who underwent emergency CABG in 2016 when she presented with NSTEMI and was found to have complex and critical RCA stenosis. She was initially treated with PCI but developed coronary dissection and developed an inferior STEMI requiring emergency CABG. Treated with a SVG-PDA graft. Now presents with progressive anginal symptoms, negative cardiac enzymes, and a nuclear scan showing inferior infarct and peri-infarct ischemia.   PROCEDURAL DETAILS:  The right groin was prepped, draped, and anesthetized with 1% lidocaine. Using modified Seldinger technique, a 5 French sheath was introduced into the right femoral artery. Standard Judkins catheters were used for coronary angiography, SVG angiography, and a pigtail catheter was used to record LV pressure. Catheter exchanges were performed over an 0.035 guidewire. There were no immediate  procedural complications. The patient was transferred to the post catheterization recovery area for further monitoring.    Estimated blood loss <50 mL.  During this procedure the patient was administered the following to achieve and maintain moderate conscious sedation: Versed 2 mg, Fentanyl 50 mcg, while the patient's heart rate, blood pressure, and oxygen saturation were continuously monitored. The period of conscious sedation was 27 minutes, of which I was present face-to-face 100% of this time.  Coronary Findings   Dominance: Right  Left Anterior Descending  Prox LAD lesion, 40% stenosed. The lesion is calcified.  Left Circumflex  Prox Cx lesion, 90% stenosed. The lesion is concentric. unchanged from previous cath study in 2016  Right Coronary Artery  Prox RCA lesion, 100% stenosed. with left-to-right collateral flow. The lesion is severely calcified. There is heavy calcification and short total occlusion of the RCA in the proximal vessel. The LAD collateralizes the RCA branches via septal collaterals.  Graft Angiography  saphenous Graft to RPDA  SVG graft was visualized by angiography. Total occlusion just beyond the aortic anastomosis.  Origin lesion, 100% stenosed.  Coronary Diagrams   Diagnostic Diagram       Echo: Study Conclusions  - Left ventricle: The cavity size was normal. Wall thickness was   increased in a pattern of mild LVH. Systolic function was normal.   The estimated ejection fraction was in the range of 55% to 60%.   Basal-mid inferior hypokinesis. Doppler parameters are consistent   with abnormal left ventricular relaxation (grade 1 diastolic   dysfunction). - Aortic valve: There was no stenosis. There was trivial   regurgitation. - Mitral valve: There was mild regurgitation. - Left atrium: The atrium was mildly dilated. - Right ventricle: The cavity size was normal. Systolic function   was normal. - Tricuspid valve: Peak RV-RA gradient (S): 19 mm  Hg. - Pulmonary arteries: PA peak pressure: 22 mm Hg (S). - Inferior vena cava: The vessel was normal in size. The   respirophasic diameter changes were in the normal range (>= 50%),   consistent with normal central venous pressure.  Impressions:  - Normal LV size with EF 55-60%. Mild LV hypertrophy. Basal to mid   inferior hypokinesis. Normal RV size and systolic function. No   significant valvular abnormalities. ASSESSMENT AND PLAN: 1.  CAD, native vessel, with angina: I reviewed the patients cardiac cath findings and with her at length. She has a chronic occlusion of the right coronary artery as well as her bypass graft to that distribution. There are left to right collaterals. There is also tight stenosis of the native circumflex which supplies a relatively small area of myocardium. Will plan to continue with medical therapy. Since she's received some initial improvement with increasing her beta blocker, will increase metoprolol further to 75 mg twice daily. She otherwise will continue isosorbide, amlodipine, aspirin, clopidogrel, and atorvastatin.  2. Hypertension: Blood pressure is well controlled.  3. Hyperlipidemia: Treated with a high intensity statin drug using atorvastatin 40 mg.  Current medicines are reviewed with the patient today.  The patient does not have concerns regarding medicines.  Labs/ tests ordered today include:  No orders of the defined types were placed in this encounter.  Disposition:   FU 6 weeks with Richardson Dopp, PA-C.   Deatra James, MD  10/26/2016 10:31 AM    [address], [place], [place]  [address] Phone: [phone]; Fax: [phone]

## 2016-10-26 NOTE — Patient Instructions (Signed)
Medication Instructions:  Your physician has recommended you make the following change in your medication:  1. INCREASE Metoprolol Tartrate to '50mg'$  take one and one-half tablet by mouth twice a day  Labwork: No new orders.   Testing/Procedures: No new orders.   Follow-Up: Your physician wants you to follow-up in: 6 WEEKS with Richardson Dopp PA-C. You will receive a reminder letter in the mail two months in advance. If you don't receive a letter, please call our office to schedule the follow-up appointment.   Any Other Special Instructions Will Be Listed Below (If Applicable).     If you need a refill on your cardiac medications before your next appointment, please call your pharmacy.

## 2016-10-27 DIAGNOSIS — Z5111 Encounter for antineoplastic chemotherapy: Secondary | ICD-10-CM | POA: Diagnosis not present

## 2016-10-27 DIAGNOSIS — I10 Essential (primary) hypertension: Secondary | ICD-10-CM | POA: Diagnosis not present

## 2016-10-27 DIAGNOSIS — C642 Malignant neoplasm of left kidney, except renal pelvis: Secondary | ICD-10-CM | POA: Diagnosis not present

## 2016-10-27 DIAGNOSIS — R52 Pain, unspecified: Secondary | ICD-10-CM | POA: Diagnosis not present

## 2016-10-27 DIAGNOSIS — R600 Localized edema: Secondary | ICD-10-CM | POA: Diagnosis not present

## 2016-10-28 ENCOUNTER — Encounter: Payer: Self-pay | Admitting: Cardiovascular Disease

## 2016-10-31 ENCOUNTER — Other Ambulatory Visit: Payer: Self-pay | Admitting: Family Medicine

## 2016-11-04 ENCOUNTER — Ambulatory Visit: Payer: 59 | Admitting: Physician Assistant

## 2016-11-09 ENCOUNTER — Telehealth (HOSPITAL_COMMUNITY): Payer: Self-pay | Admitting: Family Medicine

## 2016-11-09 NOTE — Telephone Encounter (Signed)
Verified [employer] insurance benefits through [employer]. Co-Pay $40.00, no Co-Insurance, Deductible $550.00 all has been met. Out of Pocket $2500.00, all has been met.  Reference [id number]... KJ

## 2016-11-10 DIAGNOSIS — C642 Malignant neoplasm of left kidney, except renal pelvis: Secondary | ICD-10-CM | POA: Diagnosis not present

## 2016-11-10 DIAGNOSIS — Z5112 Encounter for antineoplastic immunotherapy: Secondary | ICD-10-CM | POA: Diagnosis not present

## 2016-11-24 DIAGNOSIS — R609 Edema, unspecified: Secondary | ICD-10-CM | POA: Diagnosis not present

## 2016-11-24 DIAGNOSIS — R079 Chest pain, unspecified: Secondary | ICD-10-CM | POA: Diagnosis not present

## 2016-11-24 DIAGNOSIS — I208 Other forms of angina pectoris: Secondary | ICD-10-CM | POA: Diagnosis not present

## 2016-11-24 DIAGNOSIS — C642 Malignant neoplasm of left kidney, except renal pelvis: Secondary | ICD-10-CM | POA: Diagnosis not present

## 2016-11-24 DIAGNOSIS — I209 Angina pectoris, unspecified: Secondary | ICD-10-CM | POA: Diagnosis not present

## 2016-11-24 DIAGNOSIS — I252 Old myocardial infarction: Secondary | ICD-10-CM | POA: Diagnosis not present

## 2016-12-01 ENCOUNTER — Encounter: Payer: Self-pay | Admitting: Physician Assistant

## 2016-12-08 DIAGNOSIS — Z5112 Encounter for antineoplastic immunotherapy: Secondary | ICD-10-CM | POA: Diagnosis not present

## 2016-12-08 DIAGNOSIS — C642 Malignant neoplasm of left kidney, except renal pelvis: Secondary | ICD-10-CM | POA: Diagnosis not present

## 2016-12-14 ENCOUNTER — Encounter: Payer: Self-pay | Admitting: Physician Assistant

## 2016-12-14 ENCOUNTER — Ambulatory Visit (INDEPENDENT_AMBULATORY_CARE_PROVIDER_SITE_OTHER): Payer: 59 | Admitting: Physician Assistant

## 2016-12-14 VITALS — BP 122/78 | HR 72 | Ht 68.0 in | Wt 193.0 lb

## 2016-12-14 DIAGNOSIS — I10 Essential (primary) hypertension: Secondary | ICD-10-CM

## 2016-12-14 DIAGNOSIS — E785 Hyperlipidemia, unspecified: Secondary | ICD-10-CM | POA: Diagnosis not present

## 2016-12-14 DIAGNOSIS — C649 Malignant neoplasm of unspecified kidney, except renal pelvis: Secondary | ICD-10-CM

## 2016-12-14 DIAGNOSIS — I25119 Atherosclerotic heart disease of native coronary artery with unspecified angina pectoris: Secondary | ICD-10-CM | POA: Diagnosis not present

## 2016-12-14 MED ORDER — AMLODIPINE BESYLATE 5 MG PO TABS: 2.5000 mg | ORAL_TABLET | Freq: Every day | ORAL | 3 refills | Status: DC | PRN

## 2016-12-14 MED ORDER — ISOSORBIDE MONONITRATE ER 30 MG PO TB24: 45.0000 mg | ORAL_TABLET | Freq: Every day | ORAL | 3 refills | Status: DC

## 2016-12-14 NOTE — Progress Notes (Signed)
Cardiology Office Note:    Date:  12/14/2016   ID:  CIENA SAMPLEY, DOB August 21, 1956, MRN [id number]  PCP:  Odette Fraction, MD  Cardiologist:  Dr. Sherren Mocha   Electrophysiologist:  n/a  Oncologist:  Dr. Nunzio Cobbs at [facility]  Referring MD: Susy Frizzle, MD   Chief Complaint  Patient presents with  . Coronary Artery Disease    follow up on angina    History of Present Illness:    Sheri Hernandez is a 61 y.o. female with a hx of CAD status post CABG, chronic angina. She presented with a non-STEMI in 2016 and underwent complex PCI complicated by coronary artery dissection and acute inferior STEMI. She underwent emergent CABG with SVG-RPDA/PLA. She had progressive angina in 2/18 and underwent cardiac catheterization demonstrating occlusion of the SVG-PDA and subtotal occlusion of the native RCA with left to right collaterals and tight stenosis of the native LCx which supplied a relatively small area of myocardium. Medical therapy was recommended. Last seen by Dr. Burt Knack 2/18. She continued to complain of episodic left chest pain radiating to her left arm. Her beta blocker dose was adjusted further.   She returns for follow-up. She is here with her husband.  She feels that her angina is much better controlled on higher dose beta-blocker.  She feels that she is having anginal symptoms ~ 1 x per week.  She saw her oncologist recently and had an episode that day.  She notes relief with NTG. She has assoc shortness of breath, nausea but no diaphoresis.  She denies syncope.  She denies PND  She has chronic non-pitting ankle edema.    Prior CV studies:   The following studies were reviewed today:  LHC 10/11/16 LAD prox 40 LCx prox 90 RCA prox 100 with L-R collats S-RPDA 100 1. Severe 2 vessel CAD with tight stenosis of a small left circumflex and total occlusion of the proximal RCA (large dominant vessel) 2. Nonobstructive left main and LAD stenosis 3. Normal LV function by  noninvasive assessment 4. Heavily calcified coronary arteries Medical Rx  Myoview 10/10/16 IMPRESSION: 1. Inferolateral defect on rest and stress images, worsening on stress images concerning for area of infarct with peri-infarct ischemia. 2. Decreased inferior wall motion. 3. Left ventricular ejection fraction 53% 4. Non invasive risk stratification*: Intermediate   Echo 10/09/16 Mild LVH, EF 55-60, inferior HK, grade 1 diastolic dysfunction, mild MR, mild LAE, PASP 22  Chest CTA 2/18 IMPRESSION: 1. No acute findings. No pulmonary embolism seen, with mild study limitations detailed above. 2. Mild cardiomegaly, stable. Status post median sternotomy for presumed CABG. 3. Aortic atherosclerosis. No aortic aneurysm or evidence of aortic dissection. 4. Mild emphysematous change at each lung apex. Chronic scarring/atelectasis at the left lung base, stable or perhaps slightly improved compared to the previous chest CT. No new lung findings. No evidence of pneumonia or pulmonary edema. 5. Fatty infiltration of the liver.  Echo 02/26/16 EF 55-60, normal wall motion, grade 1 diastolic dysfunction   Past Medical History:  Diagnosis Date  . Anxiety   . Asthma   . CAD (coronary artery disease)    a. 12/2014 Cath: LM nl, LAD 20p, 73m D2 20ost, LCX 95p, 883mRCA dominant, 95p, 8064m RPDA 95, RPL2 50, attempted RCA PCI w/ acute RCA occlusion-->CABG x 2 VG->RPDA->PLVB.  . HMarland Kitchenpertension   . Lung mass    LUNG MASSES--COUGH-BIOPSY NON-DIAGNOSTIC.  HX OF MEDIASTINAL MASS AND LUNG MASSES AGE 25 -NEGATIVE BIOSPY-BUT GIVEN DIAGNOSIS OF  SARCOIDOSIS.  WORK UP CONTINUING ON DIAGNOSIS FOR PT'S LUNG MASSES--SHE HAS BIOPSY PROVEN KIDNEY CANCER  . Myocardial infarction    2016  . 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  . Papilloma of breast 2012   left. lumpectomy  . Renal cell cancer [facility])    metastatic renal cell carcinoma (biopsy of left renal mass 10/2012) On nivolumab at [facility]  . S/p nephrectomy    left, due to metastatic RCC, 3/14    Past Surgical History:  Procedure Laterality Date  . BREAST BIOPSY  08/18/2011   Procedure: BREAST BIOPSY WITH NEEDLE LOCALIZATION;  Surgeon: Merrie Roof, MD;  Location: [place];  Service: General;  Laterality: Left;  left breast biopsy with needle localization  . BRONCHIAL BRUSH BIOPSY    . CORONARY ARTERY BYPASS GRAFT N/A 01/02/2015   Procedure: CORONARY ARTERY BYPASS GRAFTING (CABG)TIMES TWO USING RIGHT GREATER SAPHENOUS LEG VEIN HARVESTED ENDOSCOPICALLY;  Surgeon: [name], MD;  Location: [place];  Service: Open Heart Surgery;  Laterality: N/A;  . LAPAROSCOPIC NEPHRECTOMY Left 11/22/2012   Procedure: LAPAROSCOPIC NEPHRECTOMY;  Surgeon: Dutch Gray, MD;  Location: [facility];  Service: Urology;  Laterality: Left;  . LEFT HEART CATH AND CORS/GRAFTS ANGIOGRAPHY N/A 10/11/2016   Procedure: Left Heart Cath and Cors/Grafts Angiography;  Surgeon: Sherren Mocha, MD;  Location: [place] CV [facility];  Service: Cardiovascular;  Laterality: N/A;  . LEFT HEART CATHETERIZATION WITH CORONARY ANGIOGRAM N/A 01/02/2015   Procedure: LEFT HEART CATHETERIZATION WITH CORONARY ANGIOGRAM;  Surgeon: Leonie Man, MD;  Location: [facility] C[facility];  Service: Cardiovascular;  Laterality: N/A;  . NECK LESION BIOPSY    . TONSILLECTOMY  1967  . VIDEO BRONCHOSCOPY  10/11/2012   Procedure: VIDEO BRONCHOSCOPY WITH FLUORO;  Surgeon: Kathee Delton, MD;  Location: [facility];  Service: Cardiopulmonary;  Laterality: Bilateral;  . WIDS    . WISDOM TOOTH EXTRACTION      Current Medications: Current Meds  Medication Sig  . amLODipine (NORVASC) 5 MG tablet Take 0.5 tablets (2.5 mg total) by mouth daily as needed.  Marland Kitchen aspirin 81 MG EC tablet Take 1 tablet (81 mg total) by mouth daily.  Marland Kitchen  atorvastatin (LIPITOR) 40 MG tablet TAKE 1 TABLET (40 MG TOTAL) BY MOUTH DAILY.  Marland Kitchen clopidogrel (PLAVIX) 75 MG tablet Take 1 tablet (75 mg total) by mouth daily.  . cyclobenzaprine (FLEXERIL) 10 MG tablet Take 1 tablet (10 mg total) by mouth 3 (three) times daily as needed for muscle spasms.  . furosemide (LASIX) 20 MG tablet Take 1 tablet (20 mg total) by mouth daily.  Marland Kitchen HYDROcodone-acetaminophen (NORCO/VICODIN) 5-325 MG tablet Take 1 tablet by mouth every 4 (four) hours as needed for moderate pain.  Marland Kitchen levothyroxine (SYNTHROID, LEVOTHROID) 137 MCG tablet TAKE 1 TABLET (137 MCG TOTAL) BY MOUTH DAILY BEFORE BREAKFAST.  Marland Kitchen lidocaine-prilocaine (EMLA) cream Apply 1 application topically as directed. Apply cream to portacath site approximately 1 hour before access and cover  . LORazepam (ATIVAN) 1 MG tablet Take 1 mg by mouth every 8 (eight) hours as needed for anxiety.   . metoprolol (LOPRESSOR) 50 MG tablet Take one and one-half tablet by mouth twice a day  . nitroGLYCERIN (NITROSTAT) 0.4 MG SL tablet Place 1 tablet (0.4 mg  total) under the tongue every 5 (five) minutes as needed for chest pain.  Marland Kitchen oxycodone (OXY-IR) 5 MG capsule Take 5 mg by mouth every 6 (six) hours as needed. pain  . prochlorperazine (COMPAZINE) 10 MG tablet Take 10 mg by mouth every 6 (six) hours as needed. for nausea  . senna-docusate (SENOKOT-S) 8.6-50 MG tablet Take 1 tablet by mouth 2 (two) times daily as needed. Constipation  . [DISCONTINUED] amLODipine (NORVASC) 5 MG tablet Take 2.5 mg by mouth daily as needed.   . [DISCONTINUED] isosorbide mononitrate (IMDUR) 30 MG 24 hr tablet Take 1 tablet (30 mg total) by mouth daily.     Allergies:   Silver   Social History   Social History  . Marital status: Married    Spouse name: N/A  . Number of children: 0  . Years of education: N/A   Occupational History  . 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    Social History Main Topics  . Smoking status: Former Smoker     Packs/day: 1.50    Years: 40.00    Types: Cigarettes    Quit date: 09/18/2012  . Smokeless tobacco: Never Used     Comment: PT STATES THAT SHE QUIT SMOKING 09/27/11. PATIENT STATES THAT SHE HAS NOT "INHALED" IN LAST 4 YEARS.   Marland Kitchen Alcohol use No     Comment: Pt reports "it's rare"  . Drug use: No  . Sexual activity: Not on file   Other Topics Concern  . Not on file   Social History Narrative  . No narrative on file     Family History  Problem Relation Age of Onset  . Heart disease Mother   . Heart disease Father   . Heart attack Father   . Cancer Maternal Grandfather   . Heart disease Brother   . Stroke Brother   . Aortic dissection Brother   . Colon cancer Maternal Aunt   . Skin cancer Maternal Uncle   . Stomach cancer Maternal Uncle   . Heart disease Maternal Grandmother   . Breast cancer Maternal Aunt      ROS:   Please see the history of present illness.    ROS All other systems reviewed and are negative.   EKGs/Labs/Other Test Reviewed:    EKG:  EKG is not ordered today.  The ekg ordered today demonstrates n/a  Recent Labs: 10/11/2016: BUN 17; Creatinine, Ser 0.95; Hemoglobin 12.9; Platelets 220; Potassium 4.0; Sodium 140   Recent Lipid Panel    Component Value Date/Time   CHOL 90 01/03/2015 0210   TRIG 77 01/03/2015 0210   HDL 15 (L) 01/03/2015 0210   CHOLHDL 6.0 01/03/2015 0210   VLDL 15 01/03/2015 0210   LDLCALC 60 01/03/2015 0210     Physical Exam:    VS:  BP 122/78 (BP Location: Left Arm, Patient Position: Sitting, Cuff Size: Normal)   Pulse 72   Ht '5\' 8"'$  (1.727 m)   Wt 193 lb (87.5 kg)   BMI 29.35 kg/m     Wt Readings from Last 3 Encounters:  12/14/16 193 lb (87.5 kg)  10/26/16 193 lb 12.8 oz (87.9 kg)  10/11/16 191 lb 8 oz (86.9 kg)     Physical Exam  Constitutional: She is oriented to person, place, and time. She appears well-developed and well-nourished. No distress.  HENT:  Head: Normocephalic and atraumatic.  Eyes: No scleral  icterus.  Neck: Normal range of motion. No JVD present.  Cardiovascular: Normal  rate, regular rhythm, S1 normal, S2 normal and normal heart sounds.   No murmur heard. Pulmonary/Chest: Breath sounds normal. She has no wheezes. She has no rhonchi. She has no rales.  Abdominal: Soft. There is no tenderness.  Musculoskeletal: She exhibits edema (trace bilateral ankle edema - mainly non-pitting).  Neurological: She is alert and oriented to person, place, and time.  Skin: Skin is warm and dry.  Psychiatric: She has a normal mood and affect.    ASSESSMENT:    1. Coronary artery disease involving native coronary artery of native heart with angina pectoris ([place])   2. Essential hypertension   3. Hyperlipidemia, unspecified hyperlipidemia type   4. Renal cell carcinoma, unspecified laterality (HCC)    PLAN:    In order of problems listed above:  1. Coronary artery disease involving native coronary artery of native heart with angina pectoris ([place]) - s/p Inf STEMI c/b RCA dissection and emergent CABG.  LHC in 2/18 demonstrated occluded S-RCA. She has chronic angina.  Her angina is better controlled on higher dose beta-blocker.  She does have relief with NTG when she has symptoms.  She only takes Amlodipine prn.  We discussed adjusting her beta-blocker further vs increasing her nitrates vs starting Amlodipine daily.  She had a lot of questions today about her ECG, prior echocardiograms, cholesterol.  I tried to answer all of her questions.    -  Increase Imdur to 45 mg QD  -  Continue current dose of Metoprolol, Amlodipine  -  Continue ASA, Plavix, Lipitor  2. Essential hypertension - BP controlled.   3. Hyperlipidemia, unspecified hyperlipidemia type  LDL 67 in 4/18.  Continue statin.   4. Renal cell carcinoma, unspecified laterality [facility])  She is followed by Oncology at [facility].   Dispo:  Return in about 3 months (around 03/15/2017) for Routine Follow Up, w/ Dr. Burt Knack, w/ Richardson Dopp, PA-C.    Medication Adjustments/Labs and Tests Ordered: Current medicines are reviewed at length with the patient today.  Concerns regarding medicines are outlined above.  Medication changes, Labs and Tests ordered today are outlined in the Patient Instructions noted below. Patient Instructions  Medication Instructions:  1. INCREASE IMDUR TO 45 MG DAILY (THIS WILL BE 1 AND 1/2 TABS DAILY); NEW RX HAS BEEN SENT IN  Labwork: NONE ORDERED  Testing/Procedures: NONE ORDERED  Follow-Up: DR. Burt Knack 3 MONTHS OR [place],PAC 3 MONTHS SAME DAY DR. Burt Knack IS IN THE OFFICE   Any Other Special Instructions Will Be Listed Below (If Applicable).  If you need a refill on your cardiac medications before your next appointment, please call your pharmacy.  Signed, Richardson Dopp, PA-C  12/14/2016 4:44 PM    [facility] [address], [place], [place]  [address] Phone: [phone]; Fax: [phone]

## 2016-12-14 NOTE — Patient Instructions (Addendum)
Medication Instructions:  1. INCREASE IMDUR TO 45 MG DAILY (THIS WILL BE 1 AND 1/2 TABS DAILY); NEW RX HAS BEEN SENT IN  Labwork: NONE ORDERED  Testing/Procedures: NONE ORDERED  Follow-Up: DR. Burt Knack 3 MONTHS OR [facility],PAC 3 MONTHS SAME DAY DR. Burt Knack IS IN THE OFFICE   Any Other Special Instructions Will Be Listed Below (If Applicable).  If you need a refill on your cardiac medications before your next appointment, please call your pharmacy.

## 2016-12-22 DIAGNOSIS — R600 Localized edema: Secondary | ICD-10-CM | POA: Diagnosis not present

## 2016-12-22 DIAGNOSIS — Z905 Acquired absence of kidney: Secondary | ICD-10-CM | POA: Diagnosis not present

## 2016-12-22 DIAGNOSIS — I252 Old myocardial infarction: Secondary | ICD-10-CM | POA: Diagnosis not present

## 2016-12-22 DIAGNOSIS — C642 Malignant neoplasm of left kidney, except renal pelvis: Secondary | ICD-10-CM | POA: Diagnosis not present

## 2016-12-22 DIAGNOSIS — C649 Malignant neoplasm of unspecified kidney, except renal pelvis: Secondary | ICD-10-CM | POA: Diagnosis not present

## 2016-12-22 DIAGNOSIS — R590 Localized enlarged lymph nodes: Secondary | ICD-10-CM | POA: Diagnosis not present

## 2017-01-05 DIAGNOSIS — C642 Malignant neoplasm of left kidney, except renal pelvis: Secondary | ICD-10-CM | POA: Diagnosis not present

## 2017-01-05 DIAGNOSIS — Z5112 Encounter for antineoplastic immunotherapy: Secondary | ICD-10-CM | POA: Diagnosis not present

## 2017-01-10 ENCOUNTER — Other Ambulatory Visit: Payer: Self-pay | Admitting: Cardiovascular Disease

## 2017-01-12 ENCOUNTER — Other Ambulatory Visit: Payer: Self-pay | Admitting: Cardiovascular Disease

## 2017-01-12 DIAGNOSIS — I2510 Atherosclerotic heart disease of native coronary artery without angina pectoris: Secondary | ICD-10-CM

## 2017-01-12 DIAGNOSIS — R609 Edema, unspecified: Secondary | ICD-10-CM

## 2017-01-12 DIAGNOSIS — R002 Palpitations: Secondary | ICD-10-CM

## 2017-01-19 DIAGNOSIS — I25110 Atherosclerotic heart disease of native coronary artery with unstable angina pectoris: Secondary | ICD-10-CM | POA: Diagnosis not present

## 2017-01-19 DIAGNOSIS — R42 Dizziness and giddiness: Secondary | ICD-10-CM | POA: Diagnosis not present

## 2017-01-19 DIAGNOSIS — C642 Malignant neoplasm of left kidney, except renal pelvis: Secondary | ICD-10-CM | POA: Diagnosis not present

## 2017-01-19 DIAGNOSIS — R51 Headache: Secondary | ICD-10-CM | POA: Diagnosis not present

## 2017-02-02 DIAGNOSIS — R51 Headache: Secondary | ICD-10-CM | POA: Diagnosis not present

## 2017-02-02 DIAGNOSIS — R42 Dizziness and giddiness: Secondary | ICD-10-CM | POA: Diagnosis not present

## 2017-02-02 DIAGNOSIS — M4642 Discitis, unspecified, cervical region: Secondary | ICD-10-CM | POA: Diagnosis not present

## 2017-02-02 DIAGNOSIS — C642 Malignant neoplasm of left kidney, except renal pelvis: Secondary | ICD-10-CM | POA: Diagnosis not present

## 2017-02-02 DIAGNOSIS — I25110 Atherosclerotic heart disease of native coronary artery with unstable angina pectoris: Secondary | ICD-10-CM | POA: Diagnosis not present

## 2017-02-02 DIAGNOSIS — Z9889 Other specified postprocedural states: Secondary | ICD-10-CM | POA: Diagnosis not present

## 2017-02-15 ENCOUNTER — Other Ambulatory Visit (HOSPITAL_COMMUNITY): Payer: Self-pay | Admitting: Cardiology

## 2017-02-15 MED ORDER — CLOPIDOGREL BISULFATE 75 MG PO TABS
75.0000 mg | ORAL_TABLET | Freq: Every day | ORAL | 8 refills | Status: DC
Start: 2017-02-15 — End: 2017-10-10

## 2017-02-15 NOTE — Telephone Encounter (Signed)
Followed by Kathlen Mody Requesting 90 day supply

## 2017-02-16 DIAGNOSIS — C799 Secondary malignant neoplasm of unspecified site: Secondary | ICD-10-CM | POA: Diagnosis not present

## 2017-02-16 DIAGNOSIS — C641 Malignant neoplasm of right kidney, except renal pelvis: Secondary | ICD-10-CM | POA: Diagnosis not present

## 2017-02-16 DIAGNOSIS — I25119 Atherosclerotic heart disease of native coronary artery with unspecified angina pectoris: Secondary | ICD-10-CM | POA: Diagnosis not present

## 2017-03-02 DIAGNOSIS — Z5112 Encounter for antineoplastic immunotherapy: Secondary | ICD-10-CM | POA: Diagnosis not present

## 2017-03-02 DIAGNOSIS — C642 Malignant neoplasm of left kidney, except renal pelvis: Secondary | ICD-10-CM | POA: Diagnosis not present

## 2017-03-16 DIAGNOSIS — R590 Localized enlarged lymph nodes: Secondary | ICD-10-CM | POA: Diagnosis not present

## 2017-03-16 DIAGNOSIS — Z905 Acquired absence of kidney: Secondary | ICD-10-CM | POA: Diagnosis not present

## 2017-03-16 DIAGNOSIS — R918 Other nonspecific abnormal finding of lung field: Secondary | ICD-10-CM | POA: Diagnosis not present

## 2017-03-16 DIAGNOSIS — Z85528 Personal history of other malignant neoplasm of kidney: Secondary | ICD-10-CM | POA: Diagnosis not present

## 2017-03-16 DIAGNOSIS — C642 Malignant neoplasm of left kidney, except renal pelvis: Secondary | ICD-10-CM | POA: Diagnosis not present

## 2017-03-16 DIAGNOSIS — C641 Malignant neoplasm of right kidney, except renal pelvis: Secondary | ICD-10-CM | POA: Diagnosis not present

## 2017-03-30 DIAGNOSIS — Z5112 Encounter for antineoplastic immunotherapy: Secondary | ICD-10-CM | POA: Diagnosis not present

## 2017-03-30 DIAGNOSIS — C642 Malignant neoplasm of left kidney, except renal pelvis: Secondary | ICD-10-CM | POA: Diagnosis not present

## 2017-03-30 DIAGNOSIS — I10 Essential (primary) hypertension: Secondary | ICD-10-CM | POA: Diagnosis not present

## 2017-04-07 ENCOUNTER — Encounter: Payer: Self-pay | Admitting: Cardiovascular Disease

## 2017-04-07 ENCOUNTER — Ambulatory Visit (INDEPENDENT_AMBULATORY_CARE_PROVIDER_SITE_OTHER): Payer: 59 | Admitting: Cardiovascular Disease

## 2017-04-07 VITALS — BP 126/56 | HR 68 | Ht 68.0 in | Wt 204.0 lb

## 2017-04-07 DIAGNOSIS — I25119 Atherosclerotic heart disease of native coronary artery with unspecified angina pectoris: Secondary | ICD-10-CM | POA: Diagnosis not present

## 2017-04-07 NOTE — Progress Notes (Signed)
Cardiology Office Note Date:  04/07/2017   ID:  Sheri Hernandez, DOB [birth year], MRN [id number]  PCP:  Susy Frizzle, MD  Cardiologist:  Sherren Mocha, MD    Chief Complaint  Patient presents with  . Follow-up    cad     History of Present Illness: Sheri Hernandez is a 61 y.o. female who presents for  follow-up of coronary artery disease and chronic angina.   She presented with non-ST elevation infarction in 2016 andunderwent complex PCI complicated by coronary dissection any acute inferior STEMI. She was taken emergently for CABG and underwent saphenous vein graft bypass to the right PDA and PLA branches. She recently was seen with progressive angina and underwent cath demonstrating occlusion of the SVG-PDA and subtotal occlusion of the native RCA with left-to-right collaterals. Medical therapy was recommended.   She has been doing well. She is here alone today. She's had 2 episodes of chest pain last week but no other problems. Symptoms were not exertional. She took one NTG and symptoms resolved. No recent problems with shortness of breath, wheezing, orthopnea, PND, or leg swelling. She does have tenderness in her legs.   Past Medical History:  Diagnosis Date  . Anxiety   . Asthma   . CAD (coronary artery disease)    a. 12/2014 Cath: LM nl, LAD 20p, 61m, D2 20ost, LCX 95p, 61m, RCA dominant, 95p, 88m/d, RPDA 95, RPL2 50, attempted RCA PCI w/ acute RCA occlusion-->CABG x 2 VG->RPDA->PLVB.  Marland Kitchen Hypertension   . Lung mass    LUNG MASSES--COUGH-BIOPSY NON-DIAGNOSTIC.  HX OF MEDIASTINAL MASS AND LUNG MASSES AGE 62 -NEGATIVE BIOSPY-BUT GIVEN DIAGNOSIS OF SARCOIDOSIS.  WORK UP CONTINUING ON DIAGNOSIS FOR PT'S LUNG MASSES--SHE HAS BIOPSY PROVEN KIDNEY CANCER  . Myocardial infarction ([place])    2016  . 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  . Papilloma of breast 2012   left. lumpectomy  . Renal cell cancer [facility])    metastatic renal cell carcinoma (biopsy of left renal mass 10/2012) On nivolumab at [facility]  . S/p nephrectomy    left, due to metastatic RCC, 3/14    Past Surgical History:  Procedure Laterality Date  . BREAST BIOPSY  08/18/2011   Procedure: BREAST BIOPSY WITH NEEDLE LOCALIZATION;  Surgeon: Merrie Roof, MD;  Location: [place];  Service: General;  Laterality: Left;  left breast biopsy with needle localization  . BRONCHIAL BRUSH BIOPSY    . CORONARY ARTERY BYPASS GRAFT N/A 01/02/2015   Procedure: CORONARY ARTERY BYPASS GRAFTING (CABG)TIMES TWO USING RIGHT GREATER SAPHENOUS LEG VEIN HARVESTED ENDOSCOPICALLY;  Surgeon: [name], MD;  Location: [place];  Service: Open Heart Surgery;  Laterality: N/A;  . LAPAROSCOPIC NEPHRECTOMY Left 11/22/2012   Procedure: LAPAROSCOPIC NEPHRECTOMY;  Surgeon: Dutch Gray, MD;  Location: [facility];  Service: Urology;  Laterality: Left;  . LEFT HEART CATH AND CORS/GRAFTS ANGIOGRAPHY N/A 10/11/2016   Procedure: Left Heart Cath and Cors/Grafts Angiography;  Surgeon: Sherren Mocha, MD;  Location: [place] CV LAB;  Service: Cardiovascular;  Laterality: N/A;  . LEFT HEART CATHETERIZATION WITH CORONARY ANGIOGRAM N/A 01/02/2015   Procedure: LEFT HEART CATHETERIZATION WITH CORONARY ANGIOGRAM;  Surgeon: Leonie Man, MD;  Location: [facility] CATH LAB;  Service: Cardiovascular;  Laterality: N/A;  . NECK LESION BIOPSY    . TONSILLECTOMY  1967  . VIDEO BRONCHOSCOPY  10/11/2012   Procedure: VIDEO BRONCHOSCOPY WITH FLUORO;  Surgeon: Kathee Delton, MD;  Location: [facility];  Service: Cardiopulmonary;  Laterality: Bilateral;  . WIDS    . WISDOM TOOTH EXTRACTION      Current Outpatient Prescriptions  Medication Sig Dispense Refill  . amLODipine (NORVASC) 5 MG tablet Take 2.5 mg by mouth daily as needed (BP).    Marland Kitchen aspirin 81 MG EC tablet Take  1 tablet (81 mg total) by mouth daily.    Marland Kitchen atorvastatin (LIPITOR) 40 MG tablet Take 1 tablet (40 mg total) by mouth daily. 90 tablet 0  . clopidogrel (PLAVIX) 75 MG tablet Take 1 tablet (75 mg total) by mouth daily. 30 tablet 8  . cyclobenzaprine (FLEXERIL) 10 MG tablet Take 1 tablet (10 mg total) by mouth 3 (three) times daily as needed for muscle spasms. 30 tablet 1  . furosemide (LASIX) 20 MG tablet Take 1 tablet (20 mg total) by mouth daily. 30 tablet 11  . HYDROcodone-acetaminophen (NORCO/VICODIN) 5-325 MG tablet Take 1 tablet by mouth every 4 (four) hours as needed for moderate pain. 20 tablet 0  . isosorbide mononitrate (IMDUR) 30 MG 24 hr tablet Take 45 mg by mouth daily.    Marland Kitchen levothyroxine (SYNTHROID, LEVOTHROID) 137 MCG tablet TAKE 1 TABLET (137 MCG TOTAL) BY MOUTH DAILY BEFORE BREAKFAST. 30 tablet 5  . lidocaine-prilocaine (EMLA) cream Apply 1 application topically as directed. Apply cream to portacath site approximately 1 hour before access and cover  3  . LORazepam (ATIVAN) 1 MG tablet Take 1 mg by mouth every 8 (eight) hours as needed for anxiety.     . metoprolol (LOPRESSOR) 50 MG tablet Take one and one-half tablet by mouth twice a day 270 tablet 2  . nitroGLYCERIN (NITROSTAT) 0.4 MG SL tablet Place 1 tablet (0.4 mg total) under the tongue every 5 (five) minutes as needed for chest pain. 25 tablet 1  . oxycodone (OXY-IR) 5 MG capsule Take 5 mg by mouth every 6 (six) hours as needed. pain    . prochlorperazine (COMPAZINE) 10 MG tablet Take 10 mg by mouth every 6 (six) hours as needed. for nausea  3  . senna-docusate (SENOKOT-S) 8.6-50 MG tablet Take 1 tablet by mouth 2 (two) times daily as needed. Constipation     No current facility-administered medications for this visit.     Allergies:   Silver   Social History:  The patient  reports that she quit smoking about 4 years ago. Her smoking use included Cigarettes. She has a 60.00 pack-year smoking history. She has never used  smokeless tobacco. She reports that she does not drink alcohol or use drugs.   Family History:  The patient's family history includes Aortic dissection in her brother; Breast cancer in her maternal aunt; Cancer in her maternal grandfather; Colon cancer in her maternal aunt; Heart attack in her father; Heart disease in her brother, father, maternal grandmother, and mother; Skin cancer in her maternal uncle; Stomach cancer in her maternal uncle; Stroke in her brother.    ROS:  Please see the history of present illness.   All other systems are reviewed and negative.    PHYSICAL EXAM: VS:  BP (!) 126/56   Pulse 68   Ht 5\' 8"  (1.727 m)   Wt 204 lb (92.5 kg)   BMI 31.02 kg/m  , BMI Body mass index is  31.02 kg/m. GEN: Well nourished, well developed, in no acute distress  HEENT: normal  Neck: no JVD, no masses. No carotid bruits Cardiac: RRR without murmur or gallop                Respiratory:  clear to auscultation bilaterally, normal work of breathing GI: soft, nontender, nondistended, + BS MS: no deformity or atrophy  Ext: 1+ bilateral pedal edema, no pretibial edema, pedal pulses 2+= bilaterally Skin: warm and dry, no rash Neuro:  Strength and sensation are intact Psych: euthymic mood, full affect  EKG:  EKG is ordered today. The ekg ordered today shows NSR 68 bpm, within normal limits  Recent Labs: 10/11/2016: BUN 17; Creatinine, Ser 0.95; Hemoglobin 12.9; Platelets 220; Potassium 4.0; Sodium 140   Lipid Panel     Component Value Date/Time   CHOL 90 01/03/2015 0210   TRIG 77 01/03/2015 0210   HDL 15 (L) 01/03/2015 0210   CHOLHDL 6.0 01/03/2015 0210   VLDL 15 01/03/2015 0210   LDLCALC 60 01/03/2015 0210      Wt Readings from Last 3 Encounters:  04/07/17 204 lb (92.5 kg)  12/14/16 193 lb (87.5 kg)  10/26/16 193 lb 12.8 oz (87.9 kg)     Cardiac Studies Reviewed: Cardiac Cath: Conclusion   1. Severe 2 vessel CAD with tight stenosis of a small left circumflex and total  occlusion of the proximal RCA (large dominant vessel) 2. Nonobstructive left main and LAD stenosis 3. Normal LV function by noninvasive assessment 4. Heavily calcified coronary arteries  Plan:  Initial medical therapy (negative enzymes this admission)  If fails medical therapy, consider PCI of the proximal LCx and CTO-PCI with atherectomy of the RCA  OK to discharge home tomorrow if no complications arise  Indications   Unstable angina (HCC) [I20.0 (ICD-10-CM)]  Procedural Details/Technique   Technical Details INDICATION: Chest pain c/w unstable angina pectoris, abnormal nuclear stress test. 61 yo woman with severe CAD who underwent emergency CABG in 2016 when she presented with NSTEMI and was found to have complex and critical RCA stenosis. She was initially treated with PCI but developed coronary dissection and developed an inferior STEMI requiring emergency CABG. Treated with a SVG-PDA graft. Now presents with progressive anginal symptoms, negative cardiac enzymes, and a nuclear scan showing inferior infarct and peri-infarct ischemia.   PROCEDURAL DETAILS:  The right groin was prepped, draped, and anesthetized with 1% lidocaine. Using modified Seldinger technique, a 5 French sheath was introduced into the right femoral artery. Standard Judkins catheters were used for coronary angiography, SVG angiography, and a pigtail catheter was used to record LV pressure. Catheter exchanges were performed over an 0.035 guidewire. There were no immediate procedural complications. The patient was transferred to the post catheterization recovery area for further monitoring.    Estimated blood loss <50 mL.  During this procedure the patient was administered the following to achieve and maintain moderate conscious sedation: Versed 2 mg, Fentanyl 50 mcg, while the patient's heart rate, blood pressure, and oxygen saturation were continuously monitored. The period of conscious sedation was 27 minutes, of  which I was present face-to-face 100% of this time.    Coronary Findings   Dominance: Right  Left Anterior Descending  Prox LAD lesion, 40% stenosed. The lesion is calcified.  Left Circumflex  Prox Cx lesion, 90% stenosed. The lesion is concentric. unchanged from previous cath study in 2016  Right Coronary Artery  Prox RCA lesion, 100% stenosed. with left-to-right collateral flow. The lesion is  severely calcified. There is heavy calcification and short total occlusion of the RCA in the proximal vessel. The LAD collateralizes the RCA branches via septal collaterals.  Graft Angiography  saphenous Graft to RPDA  SVG graft was visualized by angiography. Total occlusion just beyond the aortic anastomosis.  Origin lesion, 100% stenosed.  Coronary Diagrams   Diagnostic Diagram       Echo: Study Conclusions  - Left ventricle: The cavity size was normal. Wall thickness was increased in a pattern of mild LVH. Systolic function was normal. The estimated ejection fraction was in the range of 55% to 60%. Basal-mid inferior hypokinesis. Doppler parameters are consistent with abnormal left ventricular relaxation (grade 1 diastolic dysfunction). - Aortic valve: There was no stenosis. There was trivial regurgitation. - Mitral valve: There was mild regurgitation. - Left atrium: The atrium was mildly dilated. - Right ventricle: The cavity size was normal. Systolic function was normal. - Tricuspid valve: Peak RV-RA gradient (S): 19 mm Hg. - Pulmonary arteries: PA peak pressure: 22 mm Hg (S). - Inferior vena cava: The vessel was normal in size. The respirophasic diameter changes were in the normal range (>= 50%), consistent with normal central venous pressure.  Impressions:  - Normal LV size with EF 55-60%. Mild LV hypertrophy. Basal to mid inferior hypokinesis. Normal RV size and systolic function. No significant valvular abnormalities.   ASSESSMENT AND  PLAN: 1.  Coronary artery disease, native vessel, with angina: The patient has CCS class II symptoms. I would continue her current medical program which includes metoprolol, aspirin, and Plavix. I will see her back in 6 months.  2. Hypertension: Blood pressure is well controlled on current medicines.  3. Hyperlipidemia: The patient is treated with a high intensity statin drug. She had advanced lipid testing done one year ago demonstrating a cholesterol of 121, LDL 52, LDL particle # 827, and April lipoprotein be of 64. HDL cholesterol is 30.  The patient is going to see a dermatologist Monday morning and likely will have to have some lesions removed. I sized her that it would be okay to hold Plavix over the weekend and Monday morning for the procedure and then resume medicine if she can afterwards.  Current medicines are reviewed with the patient today.  The patient does not have concerns regarding medicines.  Labs/ tests ordered today include:   Orders Placed This Encounter  Procedures  . EKG 12-Lead    Disposition:   FU 6 months  Signed, Sherren Mocha, MD  04/07/2017 5:56 PM    [facility] [address], [place], [place]  [address] Phone: [phone]; Fax: [phone]

## 2017-04-07 NOTE — Patient Instructions (Signed)

## 2017-04-10 DIAGNOSIS — L821 Other seborrheic keratosis: Secondary | ICD-10-CM | POA: Diagnosis not present

## 2017-04-10 DIAGNOSIS — D225 Melanocytic nevi of trunk: Secondary | ICD-10-CM | POA: Diagnosis not present

## 2017-04-10 DIAGNOSIS — L814 Other melanin hyperpigmentation: Secondary | ICD-10-CM | POA: Diagnosis not present

## 2017-04-10 DIAGNOSIS — L820 Inflamed seborrheic keratosis: Secondary | ICD-10-CM | POA: Diagnosis not present

## 2017-04-18 ENCOUNTER — Other Ambulatory Visit: Payer: Self-pay | Admitting: Cardiovascular Disease

## 2017-04-18 DIAGNOSIS — R609 Edema, unspecified: Secondary | ICD-10-CM

## 2017-04-18 DIAGNOSIS — R002 Palpitations: Secondary | ICD-10-CM

## 2017-04-18 DIAGNOSIS — I2510 Atherosclerotic heart disease of native coronary artery without angina pectoris: Secondary | ICD-10-CM

## 2017-04-20 DIAGNOSIS — G893 Neoplasm related pain (acute) (chronic): Secondary | ICD-10-CM | POA: Diagnosis not present

## 2017-04-20 DIAGNOSIS — I2510 Atherosclerotic heart disease of native coronary artery without angina pectoris: Secondary | ICD-10-CM | POA: Diagnosis not present

## 2017-04-20 DIAGNOSIS — C642 Malignant neoplasm of left kidney, except renal pelvis: Secondary | ICD-10-CM | POA: Diagnosis not present

## 2017-04-21 ENCOUNTER — Telehealth: Payer: Self-pay | Admitting: Cardiovascular Disease

## 2017-04-21 NOTE — Telephone Encounter (Signed)
Left a message for the pt to call back.  

## 2017-04-21 NOTE — Telephone Encounter (Signed)
New message      Pt c/o medication issue:  1. Name of Medication: metoprolol 2. How are you currently taking this medication (dosage and times per day)?    3. Are you having a reaction (difficulty breathing--STAT)?  no 4. What is your medication issue? Calling to verify dosage of medication.

## 2017-04-21 NOTE — Telephone Encounter (Signed)
close

## 2017-04-21 NOTE — Telephone Encounter (Signed)
Informed the pt and Husband that the dose Dr Burt Knack ordered for is Metoprolol 50 mg take one and 1/2 tab by mouth BID. Both verbalized understanding and agrees with this plan.

## 2017-04-24 DIAGNOSIS — M4802 Spinal stenosis, cervical region: Secondary | ICD-10-CM | POA: Diagnosis not present

## 2017-04-24 DIAGNOSIS — M4642 Discitis, unspecified, cervical region: Secondary | ICD-10-CM | POA: Diagnosis not present

## 2017-05-04 DIAGNOSIS — C642 Malignant neoplasm of left kidney, except renal pelvis: Secondary | ICD-10-CM | POA: Diagnosis not present

## 2017-05-04 DIAGNOSIS — Z5112 Encounter for antineoplastic immunotherapy: Secondary | ICD-10-CM | POA: Diagnosis not present

## 2017-05-14 ENCOUNTER — Other Ambulatory Visit: Payer: Self-pay | Admitting: Family Medicine

## 2017-05-21 ENCOUNTER — Other Ambulatory Visit: Payer: Self-pay | Admitting: Cardiovascular Disease

## 2017-05-23 ENCOUNTER — Encounter (HOSPITAL_COMMUNITY): Payer: Self-pay

## 2017-05-23 ENCOUNTER — Emergency Department (HOSPITAL_COMMUNITY): Payer: 59

## 2017-05-23 DIAGNOSIS — L509 Urticaria, unspecified: Secondary | ICD-10-CM

## 2017-05-23 DIAGNOSIS — M4642 Discitis, unspecified, cervical region: Secondary | ICD-10-CM | POA: Diagnosis not present

## 2017-05-23 DIAGNOSIS — Z5321 Procedure and treatment not carried out due to patient leaving prior to being seen by health care provider: Secondary | ICD-10-CM

## 2017-05-23 DIAGNOSIS — M436 Torticollis: Secondary | ICD-10-CM | POA: Diagnosis not present

## 2017-05-23 DIAGNOSIS — R0602 Shortness of breath: Secondary | ICD-10-CM | POA: Diagnosis not present

## 2017-05-23 DIAGNOSIS — R079 Chest pain, unspecified: Secondary | ICD-10-CM | POA: Insufficient documentation

## 2017-05-23 DIAGNOSIS — I25110 Atherosclerotic heart disease of native coronary artery with unstable angina pectoris: Secondary | ICD-10-CM | POA: Diagnosis not present

## 2017-05-23 DIAGNOSIS — R61 Generalized hyperhidrosis: Secondary | ICD-10-CM

## 2017-05-23 LAB — BASIC METABOLIC PANEL
Anion gap: 6 (ref 5–15)
BUN: 17 mg/dL (ref 6–20)
CALCIUM: 8.2 mg/dL — AB (ref 8.9–10.3)
CO2: 27 mmol/L (ref 22–32)
CREATININE: 0.86 mg/dL (ref 0.44–1.00)
Chloride: 106 mmol/L (ref 101–111)
GFR calc non Af Amer: >60 mL/min (ref >60)
Glucose, Bld: 138 mg/dL — ABNORMAL HIGH (ref 65–99)
Potassium: 3.4 mmol/L — ABNORMAL LOW (ref 3.5–5.1)
SODIUM: 139 mmol/L (ref 135–145)

## 2017-05-23 LAB — CBC
HCT: 45.4 % (ref 36.0–46.0)
Hemoglobin: 14.9 g/dL (ref 12.0–15.0)
MCH: 28.3 pg (ref 26.0–34.0)
MCHC: 32.8 g/dL (ref 30.0–36.0)
MCV: 86.3 fL (ref 78.0–100.0)
PLATELETS: 342 10*3/uL (ref 150–400)
RBC: 5.26 MIL/uL — AB (ref 3.87–5.11)
RDW: 15.1 % (ref 11.5–15.5)
WBC: 9.5 10*3/uL (ref 4.0–10.5)

## 2017-05-23 LAB — I-STAT TROPONIN, ED: TROPONIN I, POC: 0 ng/mL (ref 0.00–0.08)

## 2017-05-23 NOTE — ED Triage Notes (Signed)
Pt states that she was working on a craft and noticed that her arm started to break out with some hives, no itching, began to have sharp central CP along with diaphoresis and arm pain. Pt is very anxious. Pt has long cardiac history.

## 2017-05-24 ENCOUNTER — Emergency Department (HOSPITAL_COMMUNITY)
Admission: EM | Admit: 2017-05-24 | Discharge: 2017-05-24 | Disposition: A | Discharge status: Home / Self Care | Payer: 59 | Source: Home / Self Care

## 2017-05-24 ENCOUNTER — Telehealth: Payer: Self-pay | Admitting: Cardiovascular Disease

## 2017-05-24 NOTE — Telephone Encounter (Signed)
Patient states she went to the ED yesterday after a "crazy episode." For about a month, she has had intermittent quarter sized red circles on her arms. She is sending pictures to her dermatologist tomorrow.  Yesterday, she had the red circles break out on her arm. She was sweating, had SOB, and chest pressure. Her friend got scared and made her go to the ED. She said they left after 6 hours.  Now, her arms are nearly clear and her CP and SOB have improved, but she reports she has "gained a bunch of pounds" in the last several days.  She reports she is up 5 or so pounds in 2-3 days.  She also states her legs are very swollen. Reiterated to her the importance of limiting salt, elevating legs. She states she is "a very different person" from when she saw Dr. Burt Knack.  Scheduled patient this Friday with Kathleen Argue, PA for evaluation.  She was grateful for call and agrees with treatment plan.

## 2017-05-24 NOTE — ED Notes (Signed)
Patient states that she is leaving because she doesn't feel safe in the waiting room. Patient had previously stopped staff will reassessing vital signs. Patient and spouse became very loud with staff due to extended wait time.  Patient stated that "this waiting room hand been turned over 2x" since they arrived.   At the time there was still 5 patients ahead of them who had waited longer.

## 2017-05-24 NOTE — Telephone Encounter (Signed)
New Message  Pt call requesting to speak with RN about getting the correct dosage she is to talk for her metoprolol  Pt also wants RN and Dr. Burt Knack to review her ER visit from last night and call back to discuss.

## 2017-05-25 DIAGNOSIS — Z951 Presence of aortocoronary bypass graft: Secondary | ICD-10-CM | POA: Diagnosis not present

## 2017-05-25 DIAGNOSIS — I219 Acute myocardial infarction, unspecified: Secondary | ICD-10-CM | POA: Diagnosis not present

## 2017-05-25 DIAGNOSIS — I25119 Atherosclerotic heart disease of native coronary artery with unspecified angina pectoris: Secondary | ICD-10-CM | POA: Diagnosis not present

## 2017-05-25 DIAGNOSIS — C642 Malignant neoplasm of left kidney, except renal pelvis: Secondary | ICD-10-CM | POA: Diagnosis not present

## 2017-05-26 ENCOUNTER — Observation Stay (HOSPITAL_COMMUNITY): Payer: 59

## 2017-05-26 ENCOUNTER — Inpatient Hospital Stay (HOSPITAL_COMMUNITY)
Admission: AD | Admit: 2017-05-26 | Discharge: 2017-05-28 | DRG: 303 | Disposition: A | Discharge status: Home / Self Care | Payer: 59 | Source: Ambulatory Visit | Attending: Cardiology | Admitting: Cardiology

## 2017-05-26 ENCOUNTER — Encounter: Payer: Self-pay | Admitting: Physician Assistant

## 2017-05-26 ENCOUNTER — Ambulatory Visit (INDEPENDENT_AMBULATORY_CARE_PROVIDER_SITE_OTHER): Payer: Self-pay | Admitting: Physician Assistant

## 2017-05-26 ENCOUNTER — Encounter (HOSPITAL_COMMUNITY): Payer: Self-pay | Admitting: General Practice

## 2017-05-26 VITALS — BP 98/60 | HR 76 | Ht 68.0 in | Wt 206.4 lb

## 2017-05-26 DIAGNOSIS — R42 Dizziness and giddiness: Secondary | ICD-10-CM

## 2017-05-26 DIAGNOSIS — Z905 Acquired absence of kidney: Secondary | ICD-10-CM

## 2017-05-26 DIAGNOSIS — R079 Chest pain, unspecified: Secondary | ICD-10-CM | POA: Diagnosis not present

## 2017-05-26 DIAGNOSIS — I25810 Atherosclerosis of coronary artery bypass graft(s) without angina pectoris: Secondary | ICD-10-CM | POA: Diagnosis present

## 2017-05-26 DIAGNOSIS — Z91048 Other nonmedicinal substance allergy status: Secondary | ICD-10-CM

## 2017-05-26 DIAGNOSIS — I5189 Other ill-defined heart diseases: Secondary | ICD-10-CM | POA: Diagnosis present

## 2017-05-26 DIAGNOSIS — R21 Rash and other nonspecific skin eruption: Secondary | ICD-10-CM

## 2017-05-26 DIAGNOSIS — Z79891 Long term (current) use of opiate analgesic: Secondary | ICD-10-CM

## 2017-05-26 DIAGNOSIS — Z8249 Family history of ischemic heart disease and other diseases of the circulatory system: Secondary | ICD-10-CM

## 2017-05-26 DIAGNOSIS — C649 Malignant neoplasm of unspecified kidney, except renal pelvis: Secondary | ICD-10-CM

## 2017-05-26 DIAGNOSIS — Z951 Presence of aortocoronary bypass graft: Secondary | ICD-10-CM

## 2017-05-26 DIAGNOSIS — R0602 Shortness of breath: Secondary | ICD-10-CM

## 2017-05-26 DIAGNOSIS — R600 Localized edema: Secondary | ICD-10-CM

## 2017-05-26 DIAGNOSIS — I10 Essential (primary) hypertension: Secondary | ICD-10-CM

## 2017-05-26 DIAGNOSIS — Z87891 Personal history of nicotine dependence: Secondary | ICD-10-CM

## 2017-05-26 DIAGNOSIS — I252 Old myocardial infarction: Secondary | ICD-10-CM

## 2017-05-26 DIAGNOSIS — E785 Hyperlipidemia, unspecified: Secondary | ICD-10-CM | POA: Diagnosis present

## 2017-05-26 DIAGNOSIS — I25110 Atherosclerotic heart disease of native coronary artery with unstable angina pectoris: Secondary | ICD-10-CM

## 2017-05-26 DIAGNOSIS — Z7901 Long term (current) use of anticoagulants: Secondary | ICD-10-CM

## 2017-05-26 DIAGNOSIS — I200 Unstable angina: Secondary | ICD-10-CM | POA: Diagnosis present

## 2017-05-26 HISTORY — DX: Unspecified osteoarthritis, unspecified site: M19.90

## 2017-05-26 HISTORY — DX: Hypothyroidism, unspecified: E03.9

## 2017-05-26 LAB — CBC WITH DIFFERENTIAL/PLATELET
BASOS ABS: 0.1 10*3/uL (ref 0.0–0.1)
Basophils Relative: 1 %
EOS ABS: 0.8 10*3/uL — AB (ref 0.0–0.7)
Eosinophils Relative: 8 %
HEMATOCRIT: 45.3 % (ref 36.0–46.0)
Hemoglobin: 15.2 g/dL — ABNORMAL HIGH (ref 12.0–15.0)
LYMPHS ABS: 2.4 10*3/uL (ref 0.7–4.0)
LYMPHS PCT: 24 %
MCH: 28.8 pg (ref 26.0–34.0)
MCHC: 33.6 g/dL (ref 30.0–36.0)
MCV: 86 fL (ref 78.0–100.0)
MONO ABS: 0.7 10*3/uL (ref 0.1–1.0)
Monocytes Relative: 7 %
NEUTROS ABS: 5.8 10*3/uL (ref 1.7–7.7)
Neutrophils Relative %: 60 %
PLATELETS: 245 10*3/uL (ref 150–400)
RBC: 5.27 MIL/uL — ABNORMAL HIGH (ref 3.87–5.11)
RDW: 15.6 % — AB (ref 11.5–15.5)
WBC: 9.8 10*3/uL (ref 4.0–10.5)

## 2017-05-26 LAB — PROTIME-INR
INR: 1.03
PROTHROMBIN TIME: 13.4 s (ref 11.4–15.2)

## 2017-05-26 LAB — APTT: APTT: 29 s (ref 24–36)

## 2017-05-26 MED ORDER — LIDOCAINE-PRILOCAINE 2.5-2.5 % EX CREA: 1.0000 "application " | TOPICAL_CREAM | CUTANEOUS | Status: DC

## 2017-05-26 MED ORDER — SODIUM CHLORIDE 0.9% FLUSH
3.0000 mL | Freq: Two times a day (BID) | INTRAVENOUS | Status: DC
  Administered 2017-05-26: 3 mL via INTRAVENOUS

## 2017-05-26 MED ORDER — OXYCODONE HCL 5 MG PO TABS: 5.0000 mg | ORAL_TABLET | Freq: Four times a day (QID) | ORAL | Status: DC | PRN

## 2017-05-26 MED ORDER — HEPARIN (PORCINE) IN NACL 100-0.45 UNIT/ML-% IJ SOLN
1150.0000 [IU]/h | INTRAMUSCULAR | Status: DC
  Administered 2017-05-26 – 2017-05-27 (×2): 1150 [IU]/h via INTRAVENOUS
  Filled 2017-05-26 (×2): qty 250

## 2017-05-26 MED ORDER — METOPROLOL TARTRATE 50 MG PO TABS
75.0000 mg | ORAL_TABLET | Freq: Two times a day (BID) | ORAL | Status: DC
  Filled 2017-05-26: qty 1

## 2017-05-26 MED ORDER — METOPROLOL TARTRATE 50 MG PO TABS: 50.0000 mg | ORAL_TABLET | Freq: Two times a day (BID) | ORAL | Status: DC

## 2017-05-26 MED ORDER — CYCLOBENZAPRINE HCL 10 MG PO TABS
10.0000 mg | ORAL_TABLET | Freq: Three times a day (TID) | ORAL | Status: DC | PRN
  Filled 2017-05-26: qty 1

## 2017-05-26 MED ORDER — LEVOTHYROXINE SODIUM 25 MCG PO TABS
137.0000 ug | ORAL_TABLET | Freq: Every day | ORAL | Status: DC
  Administered 2017-05-27 – 2017-05-28 (×2): 137 ug via ORAL
  Filled 2017-05-26 (×2): qty 1

## 2017-05-26 MED ORDER — FUROSEMIDE 10 MG/ML IJ SOLN
40.0000 mg | Freq: Once | INTRAMUSCULAR | Status: AC
  Administered 2017-05-26: 40 mg via INTRAVENOUS
  Filled 2017-05-26: qty 4

## 2017-05-26 MED ORDER — SODIUM CHLORIDE 0.9% FLUSH: 3.0000 mL | INTRAVENOUS | Status: DC | PRN

## 2017-05-26 MED ORDER — CLOPIDOGREL BISULFATE 75 MG PO TABS
75.0000 mg | ORAL_TABLET | Freq: Every day | ORAL | Status: DC
  Administered 2017-05-27: 75 mg via ORAL
  Filled 2017-05-26: qty 1

## 2017-05-26 MED ORDER — ACETAMINOPHEN 325 MG PO TABS
650.0000 mg | ORAL_TABLET | ORAL | Status: DC | PRN
Start: 2017-05-26 — End: 2017-05-28
  Administered 2017-05-27 – 2017-05-28 (×3): 650 mg via ORAL
  Filled 2017-05-26 (×4): qty 2

## 2017-05-26 MED ORDER — ASPIRIN 81 MG PO CHEW
81.0000 mg | CHEWABLE_TABLET | Freq: Every day | ORAL | Status: DC
  Administered 2017-05-27: 81 mg via ORAL
  Filled 2017-05-26: qty 1

## 2017-05-26 MED ORDER — ISOSORBIDE MONONITRATE ER 30 MG PO TB24
45.0000 mg | ORAL_TABLET | Freq: Every day | ORAL | Status: DC
  Administered 2017-05-27: 45 mg via ORAL
  Filled 2017-05-26: qty 2

## 2017-05-26 MED ORDER — PROCHLORPERAZINE MALEATE 10 MG PO TABS: 10.0000 mg | ORAL_TABLET | Freq: Four times a day (QID) | ORAL | Status: DC | PRN

## 2017-05-26 MED ORDER — SENNOSIDES-DOCUSATE SODIUM 8.6-50 MG PO TABS: 1.0000 | ORAL_TABLET | Freq: Two times a day (BID) | ORAL | Status: DC | PRN

## 2017-05-26 MED ORDER — HEPARIN BOLUS VIA INFUSION
4000.0000 [IU] | Freq: Once | INTRAVENOUS | Status: AC
  Administered 2017-05-26: 4000 [IU] via INTRAVENOUS
  Filled 2017-05-26: qty 4000

## 2017-05-26 MED ORDER — ATORVASTATIN CALCIUM 40 MG PO TABS
40.0000 mg | ORAL_TABLET | Freq: Every day | ORAL | Status: DC
  Administered 2017-05-27: 40 mg via ORAL
  Filled 2017-05-26: qty 1

## 2017-05-26 MED ORDER — NITROGLYCERIN 0.4 MG SL SUBL
0.4000 mg | SUBLINGUAL_TABLET | SUBLINGUAL | Status: DC | PRN
  Filled 2017-05-26: qty 1

## 2017-05-26 MED ORDER — METOPROLOL TARTRATE 50 MG PO TABS
50.0000 mg | ORAL_TABLET | Freq: Two times a day (BID) | ORAL | Status: DC
  Administered 2017-05-26 – 2017-05-27 (×2): 50 mg via ORAL
  Filled 2017-05-26 (×2): qty 1

## 2017-05-26 MED ORDER — LORAZEPAM 1 MG PO TABS: 1.0000 mg | ORAL_TABLET | Freq: Three times a day (TID) | ORAL | Status: DC | PRN

## 2017-05-26 MED ORDER — SODIUM CHLORIDE 0.9 % IV SOLN
250.0000 mL | INTRAVENOUS | Status: DC | PRN
Start: 2017-05-26 — End: 2017-05-28

## 2017-05-26 MED ORDER — ONDANSETRON HCL 4 MG/2ML IJ SOLN: 4.0000 mg | Freq: Four times a day (QID) | INTRAMUSCULAR | Status: DC | PRN

## 2017-05-26 NOTE — H&P (Signed)
Cardiology Admission History and Physical:   Patient ID: [name]; MRN: [id number]; DOB: 09-02-1956   Admission date: (Not on file)  Primary Care Provider: Susy Frizzle, MD Cardiologist:  Dr. Sherren Mocha   Electrophysiologist:  n/a  Oncologist:  Dr. Nunzio Cobbs at [facility]  Chief Complaint:  chest pain   Patient Profile:   Sheri Hernandez is a 61 y.o. female with a history of CAD status post CABG, chronic angina, renal cell CA who presented to the office today with chest pain, shortness of breath, dizziness suspicious for Unstable angina and edema suspicious for volume overload.   History of Present Illness:   Sheri Hernandez presented with a non-STEMI in 2016 and underwent complex PCI complicated by coronary artery dissection and acute inferior STEMI. She underwent emergent CABG with SVG-RPDA/PLA. She had progressive angina in 2/18 and underwent cardiac catheterization demonstrating occlusion of the SVG-PDA and subtotal occlusion of the native RCA with left to right collaterals and tight stenosis of the native LCx which supplied a relatively small area of myocardium. Medical therapy was recommended.  She has a history of metastatic renal cell carcinoma and is followed by oncology at [facility]. She has been on chemotherapy with nivolumab since 2015. Last seen by oncology in 8/18 and notes indicate recent imaging has demonstrated regressed disease. She was last seen by Dr. Burt Knack in 8/18. Her angina was well controlled and her medical program was continued.   She went to the emergency room 05/24/17 with a rash, chest pain and shortness of breath. She did not stay for evaluation. Chest x-ray was unremarkable. Troponin 1 was normal. ECG was nonacute. She called in to the office and noted weight gain. Follow-up was recommended.  Sheri Hernandez returns for evaluation of chest discomfort, dizziness, shortness of breath. She is here alone. As I walk in the room, she is visibly in  distress. She feels as though she may vomit and complains of significant dizziness like she is going to pass out. She then describes a spinning sensation. She appears uncomfortable at times and is diaphoretic. She tells me that over the past week she has had worsening anginal symptoms. She describes her discomfort as pressure. She has associated dyspnea, nausea, diaphoresis. She notes symptoms with minimal exertion. She does note associated jaw pain. She denies orthopnea. She has noted PND as well as LE edema. Her edema is new for her. She notes an 8 pound weight gain over the past week. She's also had a rash. She denies any new medications. She denies syncope. She has had some diarrhea but denies fever.   Past Medical History:  Diagnosis Date  . Anxiety   . Asthma   . CAD (coronary artery disease)    a. 12/2014 Cath: LM nl, LAD 20p, 26m, D2 20ost, LCX 95p, 44m, RCA dominant, 95p, 38m/d, RPDA 95, RPL2 50, attempted RCA PCI w/ acute RCA occlusion-->CABG x 2 VG->RPDA->PLVB.  Marland Kitchen Hypertension   . Lung mass    LUNG MASSES--COUGH-BIOPSY NON-DIAGNOSTIC.  HX OF MEDIASTINAL MASS AND LUNG MASSES AGE 77 -NEGATIVE BIOSPY-BUT GIVEN DIAGNOSIS OF SARCOIDOSIS.  WORK UP CONTINUING ON DIAGNOSIS FOR PT'S LUNG MASSES--SHE HAS BIOPSY PROVEN KIDNEY CANCER  . Myocardial infarction ([place])    2016  . Pain    HX OF EPISODES OF BREIF PAIN BACK OF HEAD-RIGHT SIDE- OCCURS WHEN PT TURNS HER HEAD TO RIGHT--BUT DOESN'T HAPPEN EVERY TIME SHE TURNS HER HEAD TO RIGHT--SHE HAS HAD ALL HER LIFE AND STATES PAST WORK  UPS- NEGATIVE FOR ANY BRAIN ISSUES.  . Papilloma of breast 2012   left. lumpectomy  . Renal cell cancer [facility])    metastatic renal cell carcinoma (biopsy of left renal mass 10/2012) On nivolumab at [facility]  . S/p nephrectomy    left, due to metastatic RCC, 3/14    Past Surgical History:  Procedure Laterality Date  . BREAST BIOPSY  08/18/2011   Procedure: BREAST BIOPSY WITH NEEDLE LOCALIZATION;  Surgeon: Merrie Roof,  MD;  Location: [place];  Service: General;  Laterality: Left;  left breast biopsy with needle localization  . BRONCHIAL BRUSH BIOPSY    . CORONARY ARTERY BYPASS GRAFT N/A 01/02/2015   Procedure: CORONARY ARTERY BYPASS GRAFTING (CABG)TIMES TWO USING RIGHT GREATER SAPHENOUS LEG VEIN HARVESTED ENDOSCOPICALLY;  Surgeon: [name], MD;  Location: [place];  Service: Open Heart Surgery;  Laterality: N/A;  . LAPAROSCOPIC NEPHRECTOMY Left 11/22/2012   Procedure: LAPAROSCOPIC NEPHRECTOMY;  Surgeon: Dutch Gray, MD;  Location: [facility];  Service: Urology;  Laterality: Left;  . LEFT HEART CATH AND CORS/GRAFTS ANGIOGRAPHY N/A 10/11/2016   Procedure: Left Heart Cath and Cors/Grafts Angiography;  Surgeon: Sherren Mocha, MD;  Location: [place] CV LAB;  Service: Cardiovascular;  Laterality: N/A;  . LEFT HEART CATHETERIZATION WITH CORONARY ANGIOGRAM N/A 01/02/2015   Procedure: LEFT HEART CATHETERIZATION WITH CORONARY ANGIOGRAM;  Surgeon: Leonie Man, MD;  Location: [facility] CATH LAB;  Service: Cardiovascular;  Laterality: N/A;  . NECK LESION BIOPSY    . TONSILLECTOMY  1967  . VIDEO BRONCHOSCOPY  10/11/2012   Procedure: VIDEO BRONCHOSCOPY WITH FLUORO;  Surgeon: Kathee Delton, MD;  Location: [facility];  Service: Cardiopulmonary;  Laterality: Bilateral;  . WIDS    . WISDOM TOOTH EXTRACTION       Medications Prior to Admission: Prior to Admission medications   Medication Sig Start Date End Date Taking? Authorizing Provider  amLODipine (NORVASC) 5 MG tablet Take 2.5 mg by mouth daily as needed (BP).    [provider]  aspirin 81 MG EC tablet Take 1 tablet (81 mg total) by mouth daily. 02/09/16   Sherren Mocha, MD  atorvastatin (LIPITOR) 40 MG tablet TAKE 1 TABLET (40 MG TOTAL) BY MOUTH DAILY. 04/18/17   Sherren Mocha, MD  clopidogrel (PLAVIX) 75 MG tablet Take 1 tablet (75 mg total) by mouth daily. 02/15/17   Richardson Dopp T, PA-C  cyclobenzaprine (FLEXERIL) 10 MG tablet Take 1 tablet (10 mg total) by  mouth 3 (three) times daily as needed for muscle spasms. 07/06/16   Orlena Sheldon, PA-C  furosemide (LASIX) 20 MG tablet Take 1 tablet (20 mg total) by mouth daily. 09/29/16   Sherren Mocha, MD  HYDROcodone-acetaminophen (NORCO/VICODIN) 5-325 MG tablet Take 1 tablet by mouth every 4 (four) hours as needed for moderate pain. 07/06/16   Dena Billet B, PA-C  isosorbide mononitrate (IMDUR) 30 MG 24 hr tablet TAKE 1 1/2 TABLET BY MOUTH DAILY. (PT STATES SHE DOESN'T KNOW THE DOSAGE THOUGH. 05/26/17)    [provider]  levothyroxine (SYNTHROID, LEVOTHROID) 137 MCG tablet TAKE 1 TABLET (137 MCG TOTAL) BY MOUTH DAILY BEFORE BREAKFAST. 05/15/17   Susy Frizzle, MD  lidocaine-prilocaine (EMLA) cream Apply 1 application topically as directed. Apply cream to portacath site approximately 1 hour before access and cover 09/24/16   [provider]  LORazepam (ATIVAN) 1 MG tablet Take 1 mg by mouth every 8 (eight) hours as needed for anxiety.  03/31/16   [provider]  metoprolol (LOPRESSOR) 50 MG tablet Take one and one-half tablet by mouth twice a day 10/26/16   Sherren Mocha, MD  nitroGLYCERIN (NITROSTAT) 0.4 MG SL tablet Place 1 tablet (0.4 mg total) under the tongue every 5 (five) minutes as needed for chest pain. 09/29/16   Sherren Mocha, MD  oxycodone (OXY-IR) 5 MG capsule Take 5 mg by mouth every 6 (six) hours as needed. pain 08/30/16   [provider]  prochlorperazine (COMPAZINE) 10 MG tablet Take 10 mg by mouth every 6 (six) hours as needed. for nausea 09/09/16   [provider]  senna-docusate (SENOKOT-S) 8.6-50 MG tablet Take 1 tablet by mouth 2 (two) times daily as needed. Constipation 08/30/16   [provider]     Allergies:    Allergies  Allergen Reactions  . Silver Dermatitis    Please use mepalex    Social History:   Social History   Social History  . Marital status: Married    Spouse name: N/A  . Number of children: 0  . Years of  education: N/A   Occupational History  . 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    Social History Main Topics  . Smoking status: Former Smoker    Packs/day: 1.50    Years: 40.00    Types: Cigarettes    Quit date: 09/18/2012  . Smokeless tobacco: Never Used     Comment: PT STATES THAT SHE QUIT SMOKING 09/27/11. PATIENT STATES THAT SHE HAS NOT "INHALED" IN LAST 4 YEARS.   Marland Kitchen Alcohol use No     Comment: Pt reports "it's rare"  . Drug use: No  . Sexual activity: Not on file   Other Topics Concern  . Not on file   Social History Narrative  . No narrative on file    Family History:   The patient's family history includes Aortic dissection in her brother; Breast cancer in her maternal aunt; Cancer in her maternal grandfather; Colon cancer in her maternal aunt; Heart attack in her father; Heart disease in her brother, father, maternal grandmother, and mother; Skin cancer in her maternal uncle; Stomach cancer in her maternal uncle; Stroke in her brother.    ROS:  Please see the history of present illness.  All other ROS reviewed and negative.     Physical Exam/Data:   VS:  BP 98/60   Pulse 76   Ht 5\' 8"  (1.727 m)   Wt 206 lb 6.4 oz (93.6 kg)   SpO2 95%   BMI 31.38 kg/m      Physical Exam  Constitutional: She is oriented to person, place, and time. She appears well-developed and well-nourished. She appears distressed.  HENT:  Head: Normocephalic and atraumatic.  Eyes: No scleral icterus.  Neck: JVD present.  Cardiovascular: Normal rate, regular rhythm and normal heart sounds.   No murmur heard. Pulmonary/Chest: She has decreased breath sounds. She has no rales.  Abdominal: Soft. She exhibits no distension.  Musculoskeletal: She exhibits edema (2+ bilat LE edema ).  Neurological: She is alert and oriented to person, place, and time.  Skin: Skin is warm. She is diaphoretic.  Psychiatric: She has a normal mood and affect.    EKG:  The ECG that was done  was personally  reviewed and demonstrates NSR, HR 79, normal axis, inferior T-wave inversions, poor R-wave progression, QTc 467 ms  Relevant CV Studies: LHC 10/11/16 LAD prox 40 LCx prox 90 RCA prox 100 with L-R collats S-RPDA 100 1. Severe  2 vessel CAD with tight stenosis of a small left circumflex and total occlusion of the proximal RCA (large dominant vessel) 2. Nonobstructive left main and LAD stenosis 3. Normal LV function by noninvasive assessment 4. Heavily calcified coronary arteries Medical Rx  Myoview 10/10/16 IMPRESSION: 1. Inferolateral defect on rest and stress images, worsening on stress images concerning for area of infarct with peri-infarct ischemia. 2. Decreased inferior wall motion. 3. Left ventricular ejection fraction 53% 4. Non invasive risk stratification*: Intermediate  Echo 10/09/16 Mild LVH, EF 55-60, inferior HK, grade 1 diastolic dysfunction, mild MR, mild LAE, PASP 22   Laboratory Data:  Chemistry Recent Labs Lab 05/23/17 1958  NA 139  K 3.4*  CL 106  CO2 27  GLUCOSE 138*  BUN 17  CREATININE 0.86  CALCIUM 8.2*  GFRNONAA >60  GFRAA >60  ANIONGAP 6    No results for input(s): PROT, ALBUMIN, AST, ALT, ALKPHOS, BILITOT in the last 168 hours. Hematology Recent Labs Lab 05/23/17 1958  WBC 9.5  RBC 5.26*  HGB 14.9  HCT 45.4  MCV 86.3  MCH 28.3  MCHC 32.8  RDW 15.1  PLT 342   Cardiac EnzymesNo results for input(s): TROPONINI in the last 168 hours.  Recent Labs Lab 05/23/17 2010  TROPIPOC 0.00    BNPNo results for input(s): BNP, PROBNP in the last 168 hours.  DDimer No results for input(s): DDIMER in the last 168 hours.  Radiology/Studies:  No results found.  Assessment and Plan:   1. Coronary artery disease involving native coronary artery of native heart with unstable angina pectoris ([place]) s/p Inf STEMI c/b RCA dissection and emergent CABG.  LHC in 2/18 demonstrated occluded S-RCA. She has a history of chronic angina. She presents today  with worsening discomfort in her chest over the past week with associated dyspnea, nausea, diaphoresis and jaw pain. This is her typical angina but seems to be worse. Her ECG is not acute. She also has evidence of probable volume excess with weight gain and lower extremity edema. Her right leg seems to be bigger than her left. She also complains of dizziness that sounds vertiginous in nature but she also describes near syncope. She tells me that these symptoms are very similar to what she had when she had her heart attack. She was in significant distress when I first walked in the room. I have recommended admission to the hospital for further evaluation and management. Dr. Meda Coffee (DOD) also saw the patient with me today. The patient called her husband on her phone. I tried to explain to him our concerns for his wife and the reasons for admission. He was quite upset (yelling, cursing) with their experience in the emergency room several days ago and we had a prolonged discussion over the phone. I tried to explain to him that the safest route would be to admit his wife to the hospital for further evaluation today. I have recommended that she go by ambulance given her significant distress when she first arrived. However, she has refused direct admission via EMS today. She prefers to go home and return to the hospital when a bed is available. The hospital will contact her later this afternoon.  -  Admit to [place] serial Troponin levels.  -  CMET, CBC, BNP, CXR  -  Start IV Heparin  -  Hold amlodipine given hypotension  -  Continue aspirin, Plavix, statin, beta blocker, isosorbide  -  Consider Cardiac Cath vs Nuclear stress  test based upon test results and response to Lasix   2. Leg edema She has mild diastolic dysfunction on prior echocardiograms.  However, she has not had an admission for congestive heart failure in the past.  Her R leg appears larger than her L. We will check a BNP, CXR, CMET.   Check Venous duplex bilaterally to r/o DVT.  We will give her 1 dose of IV Lasix 40 mg to see how she responds. Check echocardiogram.  3. Dizziness Etiology not clear.  Check orthostatic VS.  Check CBC, CMET. Consider neuro consult if symptoms continue.   4. Essential hypertension BP running low.  Hold Amlodipine.  5. Hyperlipidemia, unspecified hyperlipidemia type Continue statin.  6. Renal cell carcinoma, unspecified laterality ([place]) She has been on chemotherapy since 2015. Recent imaging has shown stable to regressed disease.   7. Rash Etiology not clear. She showed me pictures of the rash on her arms that she noted several days ago. This appears to be nummular eczema vs urticaria. She can follow-up with primary care.    Severity of Illness: The appropriate patient status for this patient is OBSERVATION. Observation status is judged to be reasonable and necessary in order to provide the required intensity of service to ensure the patient's safety. The patient's presenting symptoms, physical exam findings, and initial radiographic and laboratory data in the context of their medical condition is felt to place them at decreased risk for further clinical deterioration. Furthermore, it is anticipated that the patient will be medically stable for discharge from the hospital within 2 midnights of admission. The following factors support the patient status of observation.   " The patient's presenting symptoms include chest pain, shortness of breath, dizziness, edema. " The physical exam findings include diaphoresis, edema. " The initial radiographic and laboratory data are ECG is non-acute.     For questions or updates, please contact [facility] Please consult [email or web address] for contact info under Cardiology/STEMI.    Signed, Richardson Dopp, PA-C  05/26/2017 2:07 PM

## 2017-05-26 NOTE — Progress Notes (Signed)
ANTICOAGULATION CONSULT NOTE - Initial Consult  Pharmacy Consult:  Heparin Indication: chest pain/ACS  Allergies  Allergen Reactions  . Silver Dermatitis    Please use mepalex    Patient Measurements: Height: 5\' 8"  (172.7 cm) Weight: 206 lb (93.4 kg) IBW/kg (Calculated) : 63.9 Heparin Dosing Weight: 84 kg  Vital Signs: BP: 125/70 (09/21 1930) Pulse Rate: 81 (09/21 1930)  Labs: No results for input(s): HGB, HCT, PLT, APTT, LABPROT, INR, HEPARINUNFRC, HEPRLOWMOCWT, CREATININE, CKTOTAL, CKMB, TROPONINI in the last 72 hours.  Estimated Creatinine Clearance: 84.2 mL/min (by C-G formula based on SCr of 0.86 mg/dL).   Medical History: Past Medical History:  Diagnosis Date  . Anxiety   . Asthma   . CAD (coronary artery disease)    a. 12/2014 Cath: LM nl, LAD 20p, 49m, D2 20ost, LCX 95p, 72m, RCA dominant, 95p, 52m/d, RPDA 95, RPL2 50, attempted RCA PCI w/ acute RCA occlusion-->CABG x 2 VG->RPDA->PLVB.  Marland Kitchen Hypertension   . Lung mass    LUNG MASSES--COUGH-BIOPSY NON-DIAGNOSTIC.  HX OF MEDIASTINAL MASS AND LUNG MASSES AGE 61 -NEGATIVE BIOSPY-BUT GIVEN DIAGNOSIS OF SARCOIDOSIS.  WORK UP CONTINUING ON DIAGNOSIS FOR PT'S LUNG MASSES--SHE HAS BIOPSY PROVEN KIDNEY CANCER  . Myocardial infarction ([place])    2016  . 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  . Papilloma of breast 2012   left. lumpectomy  . Renal cell cancer ([facility])    metastatic renal cell carcinoma (biopsy of left renal mass 10/2012) On nivolumab at [facility]  . S/p nephrectomy    left, due to metastatic RCC, 3/14     Assessment: 61 YOF presented to the Card's office with chest pain, then admitted to [facility].  Pharmacy consulted to initiate IV heparin for ACS.  Labs from 05/23/17 reviewed; today's labs pending collection.   Goal  of Therapy:  Heparin level 0.3-0.7 units/ml Monitor platelets by anticoagulation protocol: Yes    Plan:   Heparin 4000 units IV bolus x 1, then Heparin gtt at 1150 units/hr Check 6 hr heparin level Daily heparin level and CBC   Kimisha Eunice D. Mina Marble, PharmD, BCPS Pager:  [phone] 05/26/2017, 8:05 PM

## 2017-05-26 NOTE — Progress Notes (Signed)
Cardiology Office Note:    Date:  05/26/2017   ID:  Sheri Hernandez, DOB 07/04/1956, MRN [id number]  PCP:  Susy Frizzle, MD  Cardiologist:  Dr. Sherren Mocha   Electrophysiologist:  n/a  Oncologist:  Dr. Nunzio Cobbs at [facility]  Referring MD: Susy Frizzle, MD   Chief Complaint  Patient presents with  . Hospitalization Follow-up    ED visit for chest pain, dyspnea    History of Present Illness:    Sheri Hernandez is a 61 y.o. female with a hx of CAD status post CABG, chronic angina. She presented with a non-STEMI in 2016 and underwent complex PCI complicated by coronary artery dissection and acute inferior STEMI. She underwent emergent CABG with SVG-RPDA/PLA. She had progressive angina in 2/18 and underwent cardiac catheterization demonstrating occlusion of the SVG-PDA and subtotal occlusion of the native RCA with left to right collaterals and tight stenosis of the native LCx which supplied a relatively small area of myocardium. Medical therapy was recommended.  She has a history of metastatic renal cell carcinoma and is followed by oncology at [facility]. She has been on chemotherapy with nivolumab since 2015. Last seen by oncology in 8/18 and notes indicate recent imaging has demonstrated regressed disease. She was last seen by Dr. Burt Knack in 8/18. Her angina was well controlled and her medical program was continued.   She went to the emergency room 05/24/17 with a rash, chest pain and shortness of breath. She did not stay for evaluation. Chest x-ray was unremarkable. Troponin 1 was normal. ECG was nonacute. She called in to the office and noted weight gain. Follow-up was recommended.  Sheri Hernandez returns for evaluation of chest discomfort, dizziness, shortness of breath. She is here alone. As I walk in the room, she is visibly in distress. She feels as though she may vomit and complains of significant dizziness like she is going to pass out. She then describes a spinning  sensation. She appears uncomfortable at times and is diaphoretic. She tells me that over the past week she has had worsening anginal symptoms. She describes her discomfort as pressure. She has associated dyspnea, nausea, diaphoresis. She notes symptoms with minimal exertion. She does note associated jaw pain. She denies orthopnea. She has noted PND as well as LE edema. Her edema is new for her. She notes an 8 pound weight gain over the past week. She's also had a rash. She denies any new medications. She denies syncope. She has had some diarrhea but denies fever.  Prior CV studies:   The following studies were reviewed today:  LHC 10/11/16 LAD prox 40 LCx prox 90 RCA prox 100 with L-R collats S-RPDA 100 1. Severe 2 vessel CAD with tight stenosis of a small left circumflex and total occlusion of the proximal RCA (large dominant vessel) 2. Nonobstructive left main and LAD stenosis 3. Normal LV function by noninvasive assessment 4. Heavily calcified coronary arteries Medical Rx  Myoview 10/10/16 IMPRESSION: 1. Inferolateral defect on rest and stress images, worsening on stress images concerning for area of infarct with peri-infarct ischemia. 2. Decreased inferior wall motion. 3. Left ventricular ejection fraction 53% 4. Non invasive risk stratification*: Intermediate  Echo 10/09/16 Mild LVH, EF 55-60, inferior HK, grade 1 diastolic dysfunction, mild MR, mild LAE, PASP 22  Chest CTA 2/18 IMPRESSION: 1. No acute findings. No pulmonary embolism seen, with mild study limitations detailed above. 2. Mild cardiomegaly, stable. Status post median sternotomy for presumed CABG. 3. Aortic atherosclerosis.  No aortic aneurysm or evidence of aortic dissection. 4. Mild emphysematous change at each lung apex. Chronic scarring/atelectasis at the left lung base, stable or perhaps slightly improved compared to the previous chest CT. No new lung findings. No evidence of pneumonia or pulmonary edema. 5.  Fatty infiltration of the liver.  Echo 02/26/16 EF 55-60, normal wall motion, grade 1 diastolic dysfunction  Past Medical History:  Diagnosis Date  . Anxiety   . Asthma   . CAD (coronary artery disease)    a. 12/2014 Cath: LM nl, LAD 20p, 39m, D2 20ost, LCX 95p, 54m, RCA dominant, 95p, 49m/d, RPDA 95, RPL2 50, attempted RCA PCI w/ acute RCA occlusion-->CABG x 2 VG->RPDA->PLVB.  Marland Kitchen Hypertension   . Lung mass    LUNG MASSES--COUGH-BIOPSY NON-DIAGNOSTIC.  HX OF MEDIASTINAL MASS AND LUNG MASSES AGE 50 -NEGATIVE BIOSPY-BUT GIVEN DIAGNOSIS OF SARCOIDOSIS.  WORK UP CONTINUING ON DIAGNOSIS FOR PT'S LUNG MASSES--SHE HAS BIOPSY PROVEN KIDNEY CANCER  . Myocardial infarction ([place])    2016  . 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  . Papilloma of breast 2012   left. lumpectomy  . Renal cell cancer [facility])    metastatic renal cell carcinoma (biopsy of left renal mass 10/2012) On nivolumab at [facility]  . S/p nephrectomy    left, due to metastatic RCC, 3/14    Past Surgical History:  Procedure Laterality Date  . BREAST BIOPSY  08/18/2011   Procedure: BREAST BIOPSY WITH NEEDLE LOCALIZATION;  Surgeon: Merrie Roof, MD;  Location: [facility];  Service: General;  Laterality: Left;  left breast biopsy with needle localization  . BRONCHIAL BRUSH BIOPSY    . CORONARY ARTERY BYPASS GRAFT N/A 01/02/2015   Procedure: CORONARY ARTERY BYPASS GRAFTING (CABG)TIMES TWO USING RIGHT GREATER SAPHENOUS LEG VEIN HARVESTED ENDOSCOPICALLY;  Surgeon: [name], MD;  Location: [facility];  Service: Open Heart Surgery;  Laterality: N/A;  . LAPAROSCOPIC NEPHRECTOMY Left 11/22/2012   Procedure: LAPAROSCOPIC NEPHRECTOMY;  Surgeon: Dutch Gray, MD;  Location: [facility];  Service: Urology;  Laterality: Left;  . LEFT HEART CATH AND  CORS/GRAFTS ANGIOGRAPHY N/A 10/11/2016   Procedure: Left Heart Cath and Cors/Grafts Angiography;  Surgeon: Sherren Mocha, MD;  Location: [place] CV LAB;  Service: Cardiovascular;  Laterality: N/A;  . LEFT HEART CATHETERIZATION WITH CORONARY ANGIOGRAM N/A 01/02/2015   Procedure: LEFT HEART CATHETERIZATION WITH CORONARY ANGIOGRAM;  Surgeon: Leonie Man, MD;  Location: [facility] CATH LAB;  Service: Cardiovascular;  Laterality: N/A;  . NECK LESION BIOPSY    . TONSILLECTOMY  1967  . VIDEO BRONCHOSCOPY  10/11/2012   Procedure: VIDEO BRONCHOSCOPY WITH FLUORO;  Surgeon: Kathee Delton, MD;  Location: [facility];  Service: Cardiopulmonary;  Laterality: Bilateral;  . WIDS    . WISDOM TOOTH EXTRACTION      Current Medications: Current Meds  Medication Sig  . amLODipine (NORVASC) 5 MG tablet Take 2.5 mg by mouth daily as needed (BP).  Marland Kitchen aspirin 81 MG EC tablet Take 1 tablet (81 mg total) by mouth daily.  Marland Kitchen atorvastatin (LIPITOR) 40 MG tablet TAKE 1 TABLET (40 MG TOTAL) BY MOUTH DAILY.  Marland Kitchen clopidogrel (PLAVIX) 75 MG tablet Take 1 tablet (75 mg total) by mouth daily.  . cyclobenzaprine (FLEXERIL) 10 MG tablet Take  1 tablet (10 mg total) by mouth 3 (three) times daily as needed for muscle spasms.  . furosemide (LASIX) 20 MG tablet Take 1 tablet (20 mg total) by mouth daily.  Marland Kitchen HYDROcodone-acetaminophen (NORCO/VICODIN) 5-325 MG tablet Take 1 tablet by mouth every 4 (four) hours as needed for moderate pain.  . isosorbide mononitrate (IMDUR) 30 MG 24 hr tablet TAKE 1 1/2 TABLET BY MOUTH DAILY. (PT STATES SHE DOESN'T KNOW THE DOSAGE THOUGH. 05/26/17)  . levothyroxine (SYNTHROID, LEVOTHROID) 137 MCG tablet TAKE 1 TABLET (137 MCG TOTAL) BY MOUTH DAILY BEFORE BREAKFAST.  Marland Kitchen lidocaine-prilocaine (EMLA) cream Apply 1 application topically as directed. Apply cream to portacath site approximately 1 hour before access and cover  . LORazepam (ATIVAN) 1 MG tablet Take 1 mg by mouth every 8 (eight) hours as needed for  anxiety.   . metoprolol (LOPRESSOR) 50 MG tablet Take one and one-half tablet by mouth twice a day  . nitroGLYCERIN (NITROSTAT) 0.4 MG SL tablet Place 1 tablet (0.4 mg total) under the tongue every 5 (five) minutes as needed for chest pain.  Marland Kitchen oxycodone (OXY-IR) 5 MG capsule Take 5 mg by mouth every 6 (six) hours as needed. pain  . prochlorperazine (COMPAZINE) 10 MG tablet Take 10 mg by mouth every 6 (six) hours as needed. for nausea  . senna-docusate (SENOKOT-S) 8.6-50 MG tablet Take 1 tablet by mouth 2 (two) times daily as needed. Constipation     Allergies:   Silver   Social History   Social History  . Marital status: Married    Spouse name: N/A  . Number of children: 0  . Years of education: N/A   Occupational History  . 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    Social History Main Topics  . Smoking status: Former Smoker    Packs/day: 1.50    Years: 40.00    Types: Cigarettes    Quit date: 09/18/2012  . Smokeless tobacco: Never Used     Comment: PT STATES THAT SHE QUIT SMOKING 09/27/11. PATIENT STATES THAT SHE HAS NOT "INHALED" IN LAST 4 YEARS.   Marland Kitchen Alcohol use No     Comment: Pt reports "it's rare"  . Drug use: No  . Sexual activity: Not Asked   Other Topics Concern  . None   Social History Narrative  . None     Family Hx: The patient's family history includes Aortic dissection in her brother; Breast cancer in her maternal aunt; Cancer in her maternal grandfather; Colon cancer in her maternal aunt; Heart attack in her father; Heart disease in her brother, father, maternal grandmother, and mother; Skin cancer in her maternal uncle; Stomach cancer in her maternal uncle; Stroke in her brother.  ROS:   Please see the history of present illness.    ROS All other systems reviewed and are negative.   EKGs/Labs/Other Test Reviewed:    EKG:  EKG is  ordered today.  The ekg ordered today demonstrates NSR, HR 79, normal axis, inferior T-wave inversions, poor R-wave  progression, QTc 467 ms  Recent Labs: 05/23/2017: BUN 17; Creatinine, Ser 0.86; Hemoglobin 14.9; Platelets 342; Potassium 3.4; Sodium 139   Recent Lipid Panel Lab Results  Component Value Date/Time   CHOL 90 01/03/2015 02:10 AM   TRIG 77 01/03/2015 02:10 AM   HDL 15 (L) 01/03/2015 02:10 AM   CHOLHDL 6.0 01/03/2015 02:10 AM   LDLCALC 60 01/03/2015 02:10 AM    Physical Exam:    VS:  BP  98/60   Pulse 76   Ht 5\' 8"  (1.727 m)   Wt 206 lb 6.4 oz (93.6 kg)   SpO2 95%   BMI 31.38 kg/m     Wt Readings from Last 3 Encounters:  05/26/17 206 lb 6.4 oz (93.6 kg)  04/07/17 204 lb (92.5 kg)  12/14/16 193 lb (87.5 kg)     Physical Exam  Constitutional: She is oriented to person, place, and time. She appears well-developed and well-nourished. She appears distressed.  HENT:  Head: Normocephalic and atraumatic.  Eyes: No scleral icterus.  Neck: JVD present.  Cardiovascular: Normal rate, regular rhythm and normal heart sounds.   No murmur heard. Pulmonary/Chest: She has decreased breath sounds. She has no rales.  Abdominal: Soft. She exhibits no distension.  Musculoskeletal: She exhibits edema (2+ bilat LE edema ).  Neurological: She is alert and oriented to person, place, and time.  Skin: Skin is warm. She is diaphoretic.  Psychiatric: She has a normal mood and affect.    ASSESSMENT:    1. Coronary artery disease involving native coronary artery of native heart with unstable angina pectoris (HCC)   2. Leg edema   3. Dizziness   4. Essential hypertension   5. Hyperlipidemia, unspecified hyperlipidemia type   6. Renal cell carcinoma, unspecified laterality ([place])   7. Rash    PLAN:    In order of problems listed above:  1. Coronary artery disease involving native coronary artery of native heart with unstable angina pectoris ([place]) s/p Inf STEMI c/b RCA dissection and emergent CABG.  LHC in 2/18 demonstrated occluded S-RCA. She has a history of chronic angina. She presents today  with worsening discomfort in her chest over the past week with associated dyspnea, nausea, diaphoresis and jaw pain. This is her typical angina but seems to be worse. Her ECG is not acute. She also has evidence of probable volume excess with weight gain and lower extremity edema. Her right leg seems to be bigger than her left. She also complains of dizziness that sounds vertiginous in nature but she also describes near syncope. She tells me that these symptoms are very similar to what she had when she had her heart attack. She was in significant distress when I first walked in the room. I have recommended admission to the hospital for further evaluation and management. Dr. Meda Coffee (DOD) also saw the patient with me today. The patient called her husband on her phone. I tried to explain to him our concerns for his wife and the reasons for admission. He was quite upset (yelling, cursing) with their experience in the emergency room several days ago and we had a prolonged discussion over the phone. I tried to explain to him that the safest route would be to admit his wife to the hospital for further evaluation today. I have recommended that she go by ambulance given her significant distress when she first arrived. However, she has refused direct admission via EMS today. She prefers to go home and return to the hospital when a bed is available. The hospital will contact her later this afternoon.  -  Admit to [place] serial Troponin levels.  -  CMET, CBC, BNP, CXR  -  Start IV Heparin  -  Hold amlodipine given hypotension  -  Continue aspirin, Plavix, statin, beta blocker, isosorbide  -  Consider Cardiac Catheterization vs Nuclear stress test based upon test results and response to Lasix   2. Leg edema She  has mild diastolic dysfunction on prior echocardiograms.  However, she has not had an admission for congestive heart failure in the past.  Her R leg appears larger than her L. We will check a BNP,  CXR, CMET.  Check Venous duplex bilaterally to r/o DVT.  We will give her 1 dose of IV Lasix 40 mg to see how she responds. Check echocardiogram.  3. Dizziness Etiology not clear.  Check orthostatic VS.  Check CBC, CMET. Consider neuro consult if symptoms continue.   4. Essential hypertension BP running low.  Hold Amlodipine.  5. Hyperlipidemia, unspecified hyperlipidemia type Continue statin.  6. Renal cell carcinoma, unspecified laterality ([place]) She has been on chemotherapy since 2015. Recent imaging has shown stable to regressed disease.   7. Rash Etiology not clear. She showed me pictures of the rash on her arms that she noted several days ago. This appears to be nummular eczema vs urticaria. She can follow-up with primary care.    Dispo:  Return for [facility] Follow Up.   Medication Adjustments/Labs and Tests Ordered: Current medicines are reviewed at length with the patient today.  Concerns regarding medicines are outlined above.  Tests Ordered: No orders of the defined types were placed in this encounter.  Medication Changes: No orders of the defined types were placed in this encounter.   Signed, Richardson Dopp, PA-C  05/26/2017 2:24 PM    [facility] [address], [place], [place]  [address] Phone: [phone]; Fax: [phone]

## 2017-05-26 NOTE — Patient Instructions (Signed)
Admitted to [facility]

## 2017-05-27 ENCOUNTER — Observation Stay (HOSPITAL_COMMUNITY): Payer: 59

## 2017-05-27 ENCOUNTER — Other Ambulatory Visit: Payer: Self-pay

## 2017-05-27 DIAGNOSIS — I200 Unstable angina: Secondary | ICD-10-CM | POA: Diagnosis not present

## 2017-05-27 DIAGNOSIS — I25110 Atherosclerotic heart disease of native coronary artery with unstable angina pectoris: Secondary | ICD-10-CM | POA: Diagnosis not present

## 2017-05-27 DIAGNOSIS — R0602 Shortness of breath: Secondary | ICD-10-CM

## 2017-05-27 DIAGNOSIS — Z91048 Other nonmedicinal substance allergy status: Secondary | ICD-10-CM | POA: Diagnosis not present

## 2017-05-27 DIAGNOSIS — C649 Malignant neoplasm of unspecified kidney, except renal pelvis: Secondary | ICD-10-CM | POA: Diagnosis not present

## 2017-05-27 DIAGNOSIS — Z951 Presence of aortocoronary bypass graft: Secondary | ICD-10-CM | POA: Diagnosis not present

## 2017-05-27 DIAGNOSIS — Z79891 Long term (current) use of opiate analgesic: Secondary | ICD-10-CM | POA: Diagnosis not present

## 2017-05-27 DIAGNOSIS — Z7901 Long term (current) use of anticoagulants: Secondary | ICD-10-CM | POA: Diagnosis not present

## 2017-05-27 DIAGNOSIS — R21 Rash and other nonspecific skin eruption: Secondary | ICD-10-CM | POA: Diagnosis present

## 2017-05-27 DIAGNOSIS — I252 Old myocardial infarction: Secondary | ICD-10-CM | POA: Diagnosis not present

## 2017-05-27 DIAGNOSIS — Z905 Acquired absence of kidney: Secondary | ICD-10-CM | POA: Diagnosis not present

## 2017-05-27 DIAGNOSIS — R609 Edema, unspecified: Secondary | ICD-10-CM

## 2017-05-27 DIAGNOSIS — I25810 Atherosclerosis of coronary artery bypass graft(s) without angina pectoris: Secondary | ICD-10-CM | POA: Diagnosis not present

## 2017-05-27 DIAGNOSIS — I5189 Other ill-defined heart diseases: Secondary | ICD-10-CM | POA: Diagnosis present

## 2017-05-27 DIAGNOSIS — I5030 Unspecified diastolic (congestive) heart failure: Secondary | ICD-10-CM

## 2017-05-27 DIAGNOSIS — Z87891 Personal history of nicotine dependence: Secondary | ICD-10-CM | POA: Diagnosis not present

## 2017-05-27 DIAGNOSIS — Z8249 Family history of ischemic heart disease and other diseases of the circulatory system: Secondary | ICD-10-CM | POA: Diagnosis not present

## 2017-05-27 DIAGNOSIS — E785 Hyperlipidemia, unspecified: Secondary | ICD-10-CM | POA: Diagnosis present

## 2017-05-27 LAB — COMPREHENSIVE METABOLIC PANEL
ALT: 31 U/L (ref 14–54)
AST: 35 U/L (ref 15–41)
Albumin: 2.6 g/dL — ABNORMAL LOW (ref 3.5–5.0)
Alkaline Phosphatase: 79 U/L (ref 38–126)
Anion gap: 10 (ref 5–15)
BUN: 23 mg/dL — ABNORMAL HIGH (ref 6–20)
CO2: 27 mmol/L (ref 22–32)
Calcium: 8.7 mg/dL — ABNORMAL LOW (ref 8.9–10.3)
Chloride: 105 mmol/L (ref 101–111)
Creatinine, Ser: 1.97 mg/dL — ABNORMAL HIGH (ref 0.44–1.00)
GFR calc Af Amer: 31 mL/min — ABNORMAL LOW (ref >60)
GFR calc non Af Amer: 27 mL/min — ABNORMAL LOW (ref >60)
Glucose, Bld: 144 mg/dL — ABNORMAL HIGH (ref 65–99)
Potassium: 3.7 mmol/L (ref 3.5–5.1)
Sodium: 142 mmol/L (ref 135–145)
Total Bilirubin: 0.4 mg/dL (ref 0.3–1.2)
Total Protein: 5.6 g/dL — ABNORMAL LOW (ref 6.5–8.1)

## 2017-05-27 LAB — HEPARIN LEVEL (UNFRACTIONATED)
Heparin Unfractionated: 0.66 IU/mL (ref 0.30–0.70)
Heparin Unfractionated: 0.46 IU/mL (ref 0.30–0.70)
Heparin Unfractionated: 0.46 IU/mL (ref 0.30–0.70)

## 2017-05-27 LAB — CBC
HCT: 42.3 % (ref 36.0–46.0)
Hemoglobin: 13.9 g/dL (ref 12.0–15.0)
MCH: 28.4 pg (ref 26.0–34.0)
MCHC: 32.9 g/dL (ref 30.0–36.0)
MCV: 86.3 fL (ref 78.0–100.0)
Platelets: 346 10*3/uL (ref 150–400)
RBC: 4.9 MIL/uL (ref 3.87–5.11)
RDW: 15.4 % (ref 11.5–15.5)
WBC: 10.2 10*3/uL (ref 4.0–10.5)

## 2017-05-27 LAB — TROPONIN I
Troponin I: <0.03 ng/mL (ref <0.03)
Troponin I: <0.03 ng/mL (ref <0.03)

## 2017-05-27 LAB — BASIC METABOLIC PANEL
ANION GAP: 5 (ref 5–15)
BUN: 22 mg/dL — AB (ref 6–20)
CHLORIDE: 105 mmol/L (ref 101–111)
CO2: 29 mmol/L (ref 22–32)
Calcium: 8.2 mg/dL — ABNORMAL LOW (ref 8.9–10.3)
Creatinine, Ser: 0.99 mg/dL (ref 0.44–1.00)
GFR calc Af Amer: >60 mL/min (ref >60)
GFR calc non Af Amer: >60 mL/min (ref >60)
GLUCOSE: 119 mg/dL — AB (ref 65–99)
POTASSIUM: 3.5 mmol/L (ref 3.5–5.1)
Sodium: 139 mmol/L (ref 135–145)

## 2017-05-27 LAB — BRAIN NATRIURETIC PEPTIDE: B NATRIURETIC PEPTIDE 5: 70.1 pg/mL (ref 0.0–100.0)

## 2017-05-27 LAB — TSH: TSH: 1.464 u[IU]/mL (ref 0.350–4.500)

## 2017-05-27 MED ORDER — SODIUM CHLORIDE 0.9% FLUSH
10.0000 mL | INTRAVENOUS | Status: DC | PRN
  Administered 2017-05-28 (×2): 10 mL
  Filled 2017-05-27 (×2): qty 40

## 2017-05-27 MED ORDER — FUROSEMIDE 10 MG/ML IJ SOLN
80.0000 mg | Freq: Once | INTRAMUSCULAR | Status: AC
  Administered 2017-05-27: 80 mg via INTRAVENOUS
  Filled 2017-05-27: qty 8

## 2017-05-27 NOTE — Progress Notes (Addendum)
Progress Note  Patient Name: Sheri Hernandez Date of Encounter: 05/27/2017  Primary Cardiologist: Burt Knack    Subjective   Still SOB  No CP     Inpatient Medications    Scheduled Meds: . aspirin  81 mg Oral Daily  . atorvastatin  40 mg Oral Daily  . clopidogrel  75 mg Oral Daily  . isosorbide mononitrate  45 mg Oral Daily  . levothyroxine  137 mcg Oral QAC breakfast  . metoprolol tartrate  50 mg Oral BID  . sodium chloride flush  3 mL Intravenous Q12H   Continuous Infusions: . sodium chloride    . heparin 1,150 Units/hr (05/26/17 2212)   PRN Meds: sodium chloride, acetaminophen, cyclobenzaprine, LORazepam, nitroGLYCERIN, ondansetron (ZOFRAN) IV, oxyCODONE, prochlorperazine, senna-docusate, sodium chloride flush, sodium chloride flush   Vital Signs    Vitals:   05/27/17 0514 05/27/17 0516 05/27/17 0923 05/27/17 1157  BP:  116/72 98/68 133/68  Pulse:  77 96 70  Resp:  18 18 20   Temp:  (!) 97.4 F (36.3 C) 97.7 F (36.5 C) 97.7 F (36.5 C)  TempSrc:  Oral Oral Oral  SpO2:  96% 96% 98%  Weight: 203 lb 14.4 oz (92.5 kg)     Height:        Intake/Output Summary (Last 24 hours) at 05/27/17 1226 Last data filed at 05/27/17 1121  Gross per 24 hour  Intake              540 ml  Output             1000 ml  Net             -460 ml   Filed Weights   05/26/17 1916 05/27/17 0514  Weight: 206 lb (93.4 kg) 203 lb 14.4 oz (92.5 kg)    Telemetry    SR   - Personally Reviewed  ECG      Physical Exam   GEN:  obese 61 yo No acute distress.   Neck: JVP full  Cardiac: RRR, no murmurs, rubs, or gallops.  Respiratory: Clear to auscultation bilaterally. GI: Soft, nontender, non-distended  MS:tr RLE  edema; No deformity. Neuro:  Nonfocal  Psych: Normal affect   Labs    Chemistry Recent Labs Lab 05/23/17 1958 05/27/17 0027 05/27/17 0832  NA 139 142 139  K 3.4* 3.7 3.5  CL 106 105 105  CO2 27 27 29   GLUCOSE 138* 144* 119*  BUN 17 23* 22*  CREATININE 0.86  1.97* 0.99  CALCIUM 8.2* 8.7* 8.2*  PROT  --  5.6*  --   ALBUMIN  --  2.6*  --   AST  --  35  --   ALT  --  31  --   ALKPHOS  --  79  --   BILITOT  --  0.4  --   GFRNONAA >60 27* >60  GFRAA >60 31* >60  ANIONGAP 6 10 5      Hematology Recent Labs Lab 05/23/17 1958 05/26/17 2150 05/27/17 0832  WBC 9.5 9.8 10.2  RBC 5.26* 5.27* 4.90  HGB 14.9 15.2* 13.9  HCT 45.4 45.3 42.3  MCV 86.3 86.0 86.3  MCH 28.3 28.8 28.4  MCHC 32.8 33.6 32.9  RDW 15.1 15.6* 15.4  PLT 342 245 346    Cardiac Enzymes Recent Labs Lab 05/26/17 2150 05/27/17 0027 05/27/17 0832  TROPONINI <0.03 <0.03 <0.03    Recent Labs Lab 05/23/17 2010  TROPIPOC 0.00     BNP  Recent Labs Lab 05/27/17 0027  BNP 70.1     DDimer No results for input(s): DDIMER in the last 168 hours.   Radiology    X-ray Chest Pa And Lateral  Result Date: 05/26/2017 CLINICAL DATA:  Chest pain.  Shortness of breath. EXAM: CHEST  2 VIEW COMPARISON:  05/23/2017. FINDINGS: The cardiac silhouette remains mildly enlarged. Stable post CABG changes. The right jugular porta catheter is unchanged. The lungs are clear and the interstitial markings remain mildly prominent. Mild thoracic spine degenerative changes. IMPRESSION: No acute abnormality. Electronically Signed   By: Claudie Revering M.D.   On: 05/26/2017 20:52    Cardiac Studies   Vasc  No DVT or SVT    Patient Profile     61 y.o. female CAD status post CABG, chronic angina, renal cell CA who presented to the office today with chest pain, shortness of breath, dizziness suspicious for Unstable angina and edema suspicious for volume overload  Assessment & Plan    1CAD  Pt ruled out for MI  Still SOB  Did not respond to lasix  Will increase add follow response   2  Edema  LE doppler neg for DVT  Echo pending     For questions or updates, please contact [facility] Please consult [email or web address] for contact info under Cardiology/STEMI.      Signed, [name], MD    05/27/2017, 12:26 PM

## 2017-05-27 NOTE — Plan of Care (Signed)
Problem: Tissue Perfusion: Goal: Risk factors for ineffective tissue perfusion will decrease Outcome: Progressing Maintains oxygen saturation in the 90's on room air   Problem: Fluid Volume: Goal: Ability to maintain a balanced intake and output will improve Outcome: Progressing Received lasix IV x 1 this shift

## 2017-05-27 NOTE — Plan of Care (Signed)
Patient arrived form [facility] before shift change. A&O x 4. VSS. ST/SR on telemetry. No complaints of pain. Low fall risk.

## 2017-05-27 NOTE — Progress Notes (Signed)
  Echocardiogram 2D Echocardiogram has been performed.  Sheri Hernandez 05/27/2017, 5:06 PM

## 2017-05-27 NOTE — Progress Notes (Signed)
ANTICOAGULATION CONSULT NOTE - Follow Up Consult  Pharmacy Consult for heparin Indication: USAP  Labs:  Recent Labs  05/26/17 2150 05/27/17 0023 05/27/17 0027 05/27/17 0500  HGB 15.2*  --   --   --   HCT 45.3  --   --   --   PLT 245  --   --   --   APTT 29  --   --   --   LABPROT 13.4  --   --   --   INR 1.03  --   --   --   HEPARINUNFRC  --  0.66  --  0.46  CREATININE  --   --  1.97*  --   TROPONINI <0.03  --  <0.03  --     Assessment/Plan:  61yo female therapeutic on heparin with initial dosing for CP. Will continue gtt at current rate and confirm stable with additional level.   Wynona Neat, PharmD, BCPS  05/27/2017,5:56 AM

## 2017-05-27 NOTE — Progress Notes (Signed)
ANTICOAGULATION CONSULT NOTE - Follow-up Consult  Pharmacy Consult:  Heparin Indication: chest pain/ACS  Allergies  Allergen Reactions  . Silver Dermatitis    Please use mepalex   Patient Measurements: Height: 5\' 8"  (172.7 cm) Weight: 203 lb 14.4 oz (92.5 kg) IBW/kg (Calculated) : 63.9 Heparin Dosing Weight: 84 kg  Vital Signs: Temp: 97.7 F (36.5 C) (09/Sheri 1157) Temp Source: Oral (09/Sheri 1157) BP: 133/68 (09/Sheri 1157) Pulse Rate: 70 (09/Sheri 1157)  Labs:  Recent Labs  05/26/17 2150 09/Sheri/18 0023 09/Sheri/18 0027 09/Sheri/18 0500 09/Sheri/18 0832  HGB 15.2*  --   --   --  13.9  HCT 45.3  --   --   --  42.3  PLT 245  --   --   --  346  APTT 29  --   --   --   --   LABPROT 13.4  --   --   --   --   INR 1.03  --   --   --   --   HEPARINUNFRC  --  0.66  --  0.46  --   CREATININE  --   --  1.97*  --  0.99  TROPONINI <0.03  --  <0.03  --  <0.03   Estimated Creatinine Clearance: 72.7 mL/min (by C-G formula based on SCr of 0.99 mg/dL).  Medical History: Past Medical History:  Diagnosis Date  . Anxiety   . Arthritis    "maybe in my right hand" (05/26/2017)  . CAD (coronary artery disease)    a. 12/2014 Cath: LM nl, LAD 20p, 42m, D2 20ost, LCX 95p, 57m, RCA dominant, 95p, 49m/d, RPDA 95, RPL2 50, attempted RCA PCI w/ acute RCA occlusion-->CABG x 2 VG->RPDA->PLVB.  Marland Kitchen Hypertension   . Hypothyroidism   . Lung mass    LUNG MASSES--COUGH-BIOPSY NON-DIAGNOSTIC.  HX OF MEDIASTINAL MASS AND LUNG MASSES AGE 61 -NEGATIVE BIOSPY-BUT GIVEN DIAGNOSIS OF SARCOIDOSIS.  WORK UP CONTINUING ON DIAGNOSIS FOR PT'S LUNG MASSES--SHE HAS BIOPSY PROVEN KIDNEY CANCER  . Myocardial infarction ([place]) 12/2014 X 2  . 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  . Papilloma of breast 2012   left. lumpectomy  . Renal  cancer ([place])    "stage IV" (05/26/2017)  . Renal cell cancer (HCC)    metastatic renal cell carcinoma (biopsy of left renal mass 10/2012) On nivolumab at [facility]  . S/p nephrectomy    left, due to metastatic RCC, 3/14   Assessment: 61 Sheri Hernandez presented to the Card's office with chest pain, then admitted to [facility].  Pharmacy consulted to initiate IV heparin for ACS.  CBC stable; no bleeding documented  Heparin level therapeutic: 0.46  Goal of Therapy:  Heparin level 0.3-0.7 units/ml Monitor platelets by anticoagulation protocol: Yes   Plan:   Continue heparin gtt at 1150 units/hr Daily heparin level and CBC Monitor for s/sx of bleeding  Georga Bora, PharmD Clinical Pharmacist 9/Sheri/2018 12:58 PM

## 2017-05-27 NOTE — Progress Notes (Signed)
VASCULAR LAB PRELIMINARY  PRELIMINARY  PRELIMINARY  PRELIMINARY  Bilateral lower extremity venous duplex completed.    Preliminary report:  There is no DVT or SVT noted in the bilateral lower extremities.   Dickie Labarre, RVT 05/27/2017, 10:38 AM

## 2017-05-28 LAB — HEPARIN LEVEL (UNFRACTIONATED): Heparin Unfractionated: 0.6 IU/mL (ref 0.30–0.70)

## 2017-05-28 LAB — CBC
HCT: 43.7 % (ref 36.0–46.0)
Hemoglobin: 14.6 g/dL (ref 12.0–15.0)
MCH: 28.9 pg (ref 26.0–34.0)
MCHC: 33.4 g/dL (ref 30.0–36.0)
MCV: 86.4 fL (ref 78.0–100.0)
Platelets: 355 10*3/uL (ref 150–400)
RBC: 5.06 MIL/uL (ref 3.87–5.11)
RDW: 15.8 % — ABNORMAL HIGH (ref 11.5–15.5)
WBC: 10.4 10*3/uL (ref 4.0–10.5)

## 2017-05-28 LAB — ECHOCARDIOGRAM COMPLETE
HEIGHTINCHES: 68 in
Weight: 3262.4 oz

## 2017-05-28 MED ORDER — HEPARIN SOD (PORK) LOCK FLUSH 100 UNIT/ML IV SOLN
500.0000 [IU] | INTRAVENOUS | Status: DC
  Administered 2017-05-28: 500 [IU]
  Filled 2017-05-28: qty 5

## 2017-05-28 MED ORDER — LORATADINE 10 MG PO TABS
10.0000 mg | ORAL_TABLET | Freq: Every day | ORAL | Status: DC
  Administered 2017-05-28: 10 mg via ORAL
  Filled 2017-05-28: qty 1

## 2017-05-28 MED ORDER — FAMOTIDINE 20 MG PO TABS: 20.0000 mg | ORAL_TABLET | Freq: Every day | ORAL | 2 refills | Status: DC

## 2017-05-28 MED ORDER — FAMOTIDINE 20 MG PO TABS
20.0000 mg | ORAL_TABLET | Freq: Every day | ORAL | Status: DC
  Administered 2017-05-28: 20 mg via ORAL
  Filled 2017-05-28: qty 1

## 2017-05-28 MED ORDER — LORATADINE 10 MG PO TABS: 10.0000 mg | ORAL_TABLET | Freq: Every day | ORAL | 2 refills | Status: DC

## 2017-05-28 MED ORDER — HEPARIN SOD (PORK) LOCK FLUSH 100 UNIT/ML IV SOLN
500.0000 [IU] | INTRAVENOUS | Status: DC | PRN
  Filled 2017-05-28: qty 5

## 2017-05-28 NOTE — Discharge Summary (Signed)
Discharge Summary    Patient ID: Sheri Hernandez,  MRN: [id number], DOB/AGE: 05-Feb-1956 61 y.o.  Admit date: 05/26/2017 Discharge date: 05/28/2017  Primary Care Provider: Jenna Luo T Primary Cardiologist: Dr. Sherren Mocha  Discharge Diagnoses    Active Problems:   Unstable angina [facility])   Allergies Allergies  Allergen Reactions  . Silver Dermatitis    Please use mepalex    Diagnostic Studies/Procedures    Echocardiogram 05/27/17 Study Conclusions  - Left ventricle: The cavity size was normal. Wall thickness was   normal. Systolic function was vigorous. The estimated ejection   fraction was in the range of 65% to 70%. Doppler parameters are   consistent with abnormal left ventricular relaxation (grade 1   diastolic dysfunction). _____________   History of Present Illness     Sheri Hernandez is a 61 y.o. female with a history of CAD status post CABG, chronic angina, renal cell CA who presented to the office on 05/26/17 with chest pain, shortness of breath, dizziness suspicious for Unstable angina and edema suspicious for volume overload.She had gone to the ED on 9/18 with a rash, chest pain and shortness of breath. She did not stay for evaluation. Chest x-ray was unremarkable. Troponin 1 was normal. ECG was nonacute. She called in to the office and noted weight gain. Follow-up was recommended.  When she presented on 05/26/17 for follow up she had complaints of chest discomfort, dizziness, shortness of breath.. She was quite distressed on arrival. She felt as though she may vomit and complained of significant dizziness like she was going to pass out. She then described a spinning sensation. She appeared uncomfortable at times and was diaphoretic. She reported that over the past week she had worsening anginal symptoms. She described her discomfort as pressure. She had associated dyspnea, nausea, diaphoresis. She noted symptoms with minimal exertion. She does note  associated jaw pain. She denied orthopnea. She also noted PND as well as LE edema. Her edema is new for her. She noted an 8 pound weight gain over the past week. She also had a rash. She denies any new medications. She denied syncope. She has had some diarrhea but denies fever.  The patient was directly admitted to [facility] after some reluctance by the patient and resistance by her husband (via phone).   Hospital Course     Consultants: None  Cardiac troponins were trended and were negative and she ruled out for MI. With her lower extremity edema, lower extremity dopplers were done and showed no DVT. An echocardiogram showed normal LV systolic function with EF 65-70% and grade 1 dd, no other significant abnormalities.   Pt was given 2 doses of IV lasix. Wt. 206-->200  Review of "spells" she is having, she showed pictures on her phone with welts on arm during spells. She has alos had on her finger/hand. Pictures suggest spells are not ischemic.  May represent histamine release due to allergy. Claritin and pepcid have been added. Keep picture to show to dermatologist.   Blood pressure has been low. Amlodipine has been stopped. BP this am is 122/60. Will need to follow up.  Continue all other prior medications.   Serum creatinine 0.99 on discharge. K+ 3.5.   Patient has been seen by Dr. Harrington Challenger today and deemed ready for discharge home. All follow up appointments have been scheduled. Discharge medications are listed below. _____________  Discharge Vitals Blood pressure 122/60, pulse 79, temperature 97.9 F (36.6 C), temperature  source Oral, resp. rate 20, height 5\' 8"  (1.727 m), weight 200 lb 9.6 oz (91 kg), SpO2 96 %.  Filed Weights   05/26/17 1916 05/27/17 0514 05/28/17 0356  Weight: 206 lb (93.4 kg) 203 lb 14.4 oz (92.5 kg) 200 lb 9.6 oz (91 kg)    Labs & Radiologic Studies    CBC  Recent Labs  05/26/17 2150 05/27/17 0832 05/28/17 0413  WBC 9.8 10.2 10.4  NEUTROABS  5.8  --   --   HGB 15.2* 13.9 14.6  HCT 45.3 42.3 43.7  MCV 86.0 86.3 86.4  PLT 245 346 341   Basic Metabolic Panel  Recent Labs  05/27/17 0027 05/27/17 0832  NA 142 139  K 3.7 3.5  CL 105 105  CO2 27 29  GLUCOSE 144* 119*  BUN 23* 22*  CREATININE 1.97* 0.99  CALCIUM 8.7* 8.2*   Liver Function Tests  Recent Labs  05/27/17 0027  AST 35  ALT 31  ALKPHOS 79  BILITOT 0.4  PROT 5.6*  ALBUMIN 2.6*   No results for input(s): LIPASE, AMYLASE in the last 72 hours. Cardiac Enzymes  Recent Labs  05/26/17 2150 05/27/17 0027 05/27/17 0832  TROPONINI <0.03 <0.03 <0.03   BNP Invalid input(s): POCBNP D-Dimer No results for input(s): DDIMER in the last 72 hours. Hemoglobin A1C No results for input(s): HGBA1C in the last 72 hours. Fasting Lipid Panel No results for input(s): CHOL, HDL, LDLCALC, TRIG, CHOLHDL, LDLDIRECT in the last 72 hours. Thyroid Function Tests  Recent Labs  05/27/17 0027  TSH 1.464   _____________  X-ray Chest Pa And Lateral  Result Date: 05/26/2017 CLINICAL DATA:  Chest pain.  Shortness of breath. EXAM: CHEST  2 VIEW COMPARISON:  05/23/2017. FINDINGS: The cardiac silhouette remains mildly enlarged. Stable post CABG changes. The right jugular porta catheter is unchanged. The lungs are clear and the interstitial markings remain mildly prominent. Mild thoracic spine degenerative changes. IMPRESSION: No acute abnormality. Electronically Signed   By: Claudie Revering M.D.   On: 05/26/2017 20:52   Dg Chest 2 View  Result Date: 05/23/2017 CLINICAL DATA:  Chest pain, shortness of breath, coronary disease EXAM: CHEST  2 VIEW COMPARISON:  10/08/2016 FINDINGS: Previous coronary bypass changes noted. Double-lumen right IJ power port catheter tip SVC RA junction as before. Lungs remain clear. Mild cardiomegaly with central vascular congestion. Negative for edema, CHF, effusion or pneumothorax. Minor scoliosis of the spine. Trachea is midline. IMPRESSION: Stable  chest exam.  No interval change or superimposed acute process Electronically Signed   By: Jerilynn Mages.  Shick M.D.   On: 05/23/2017 20:34   Disposition   Pt is being discharged home today in good condition.  Follow-up Plans & Appointments    Follow-up Information    Liliane Shi, PA-C Follow up.   Specialties:  Cardiology, Physician Assistant Why:  Hospital follow up on 06/16/17 at 9:15.  Contact information: [address] [place] [address] [phone]            Discharge Medications   Current Discharge Medication List    CONTINUE these medications which have NOT CHANGED   Details  Acetaminophen (TYLENOL PO) Take 2 tablets by mouth every 6 (six) hours as needed (headache).    amLODipine (NORVASC) 5 MG tablet Take 2.5 mg by mouth daily as needed (SBP >170).     aspirin 81 MG EC tablet Take 1 tablet (81 mg total) by mouth daily.   Associated Diagnoses: SOB (shortness of  breath); Coronary artery disease involving coronary bypass graft of native heart with other forms of angina pectoris (HCC)    atorvastatin (LIPITOR) 40 MG tablet TAKE 1 TABLET (40 MG TOTAL) BY MOUTH DAILY. Qty: 90 tablet, Refills: 3   Associated Diagnoses: Edema; Coronary artery disease involving native coronary artery of native heart without angina pectoris; Palpitations    clopidogrel (PLAVIX) 75 MG tablet Take 1 tablet (75 mg total) by mouth daily. Qty: 30 tablet, Refills: 8    cyclobenzaprine (FLEXERIL) 10 MG tablet Take 1 tablet (10 mg total) by mouth 3 (three) times daily as needed for muscle spasms. Qty: 30 tablet, Refills: 1   Associated Diagnoses: Bilateral neck pain    furosemide (LASIX) 20 MG tablet Take 1 tablet (20 mg total) by mouth daily. Qty: 30 tablet, Refills: 11    HYDROcodone-acetaminophen (NORCO/VICODIN) 5-325 MG tablet Take 1 tablet by mouth every 4 (four) hours as needed for moderate pain. Qty: 20 tablet, Refills: 0   Associated Diagnoses: Bilateral neck pain      isosorbide mononitrate (IMDUR) 30 MG 24 hr tablet Take 45 mg by mouth daily.     levothyroxine (SYNTHROID, LEVOTHROID) 137 MCG tablet TAKE 1 TABLET (137 MCG TOTAL) BY MOUTH DAILY BEFORE BREAKFAST. Qty: 30 tablet, Refills: 5    lidocaine-prilocaine (EMLA) cream Apply 1 application topically See admin instructions. Apply cream to portacath site approximately 1 hour before access and cover Refills: 3    LORazepam (ATIVAN) 1 MG tablet Take 1 mg by mouth daily as needed for anxiety.     metoprolol (LOPRESSOR) 50 MG tablet Take one and one-half tablet by mouth twice a day Qty: 270 tablet, Refills: 2    nitroGLYCERIN (NITROSTAT) 0.4 MG SL tablet Place 1 tablet (0.4 mg total) under the tongue every 5 (five) minutes as needed for chest pain. Qty: 25 tablet, Refills: 1   Associated Diagnoses: Coronary artery disease involving coronary bypass graft of native heart with other forms of angina pectoris ([place]); Hyperlipidemia, unspecified hyperlipidemia type    oxycodone (OXY-IR) 5 MG capsule Take 5 mg by mouth daily as needed (severe pain). pain      STOP taking these medications     prochlorperazine (COMPAZINE) 10 MG tablet      senna-docusate (SENOKOT-S) 8.6-50 MG tablet            Outstanding Labs/Studies   none  Duration of Discharge Encounter   Greater than 30 minutes including physician time.  Signed, Daune Perch NP 05/28/2017, 10:40 AM  Agree with findingas as noted above  Patient seen earlier  Plan for trial of antihistamine  F/U in clinic  [facility]

## 2017-05-28 NOTE — Progress Notes (Signed)
Progress Note  Patient Name: Sheri Hernandez Date of Encounter: 05/28/2017  Primary Cardiologist: Burt Knack    Subjective   [name]  No warm, flushedk nausea sensations   Breathing is OK    Inpatient Medications    Scheduled Meds: . aspirin  81 mg Oral Daily  . atorvastatin  40 mg Oral Daily  . clopidogrel  75 mg Oral Daily  . isosorbide mononitrate  45 mg Oral Daily  . levothyroxine  137 mcg Oral QAC breakfast  . metoprolol tartrate  50 mg Oral BID  . sodium chloride flush  3 mL Intravenous Q12H   Continuous Infusions: . sodium chloride    . heparin 1,150 Units/hr (05/27/17 1456)   PRN Meds: sodium chloride, acetaminophen, cyclobenzaprine, LORazepam, nitroGLYCERIN, ondansetron (ZOFRAN) IV, oxyCODONE, prochlorperazine, senna-docusate, sodium chloride flush, sodium chloride flush   Vital Signs    Vitals:   05/27/17 1935 05/28/17 0103 05/28/17 0356 05/28/17 0747  BP: 110/70 106/68 130/67 122/60  Pulse: (!) 102 79 91 79  Resp: 18 18 18 20   Temp: 98.4 F (36.9 C) 98 F (36.7 C) 97.8 F (36.6 C) 97.9 F (36.6 C)  TempSrc: Oral Oral Oral Oral  SpO2: 96% 98% 98% 96%  Weight:   200 lb 9.6 oz (91 kg)   Height:        Intake/Output Summary (Last 24 hours) at 05/28/17 0805 Last data filed at 05/28/17 0419  Gross per 24 hour  Intake              388 ml  Output              700 ml  Net             -312 ml   Incomplete  I/O   Filed Weights   05/26/17 1916 05/27/17 0514 05/28/17 0356  Weight: 206 lb (93.4 kg) 203 lb 14.4 oz (92.5 kg) 200 lb 9.6 oz (91 kg)    Telemetry    SR   - Personally Reviewed  ECG      Physical Exam   GEN: Obese 61 yo No acute distress.   Neck: JVP normal   Cardiac: RRR, no murmurs, rubs, or gallops.  Respiratory: Clear to auscultation bilaterally. GI: Soft, nontender, non-distended  MS: No   edema; No deformity. Neuro:  Nonfocal  Psych: Normal affect   Labs    Chemistry  Recent Labs Lab 05/23/17 1958 05/27/17 0027  05/27/17 0832  NA 139 142 139  K 3.4* 3.7 3.5  CL 106 105 105  CO2 27 27 29   GLUCOSE 138* 144* 119*  BUN 17 23* 22*  CREATININE 0.86 1.97* 0.99  CALCIUM 8.2* 8.7* 8.2*  PROT  --  5.6*  --   ALBUMIN  --  2.6*  --   AST  --  35  --   ALT  --  31  --   ALKPHOS  --  79  --   BILITOT  --  0.4  --   GFRNONAA >60 27* >60  GFRAA >60 31* >60  ANIONGAP 6 10 5      Hematology  Recent Labs Lab 05/26/17 2150 05/27/17 0832 05/28/17 0413  WBC 9.8 10.2 10.4  RBC 5.27* 4.90 5.06  HGB 15.2* 13.9 14.6  HCT 45.3 42.3 43.7  MCV 86.0 86.3 86.4  MCH 28.8 28.4 28.9  MCHC 33.6 32.9 33.4  RDW 15.6* 15.4 15.8*  PLT 245 346 355    Cardiac Enzymes  Recent Labs  Lab 05/26/17 2150 05/27/17 0027 05/27/17 0832  TROPONINI <0.03 <0.03 <0.03     Recent Labs Lab 05/23/17 2010  TROPIPOC 0.00     BNP  Recent Labs Lab 05/27/17 0027  BNP 70.1     DDimer No results for input(s): DDIMER in the last 168 hours.   Radiology    X-ray Chest Pa And Lateral  Result Date: 05/26/2017 CLINICAL DATA:  Chest pain.  Shortness of breath. EXAM: CHEST  2 VIEW COMPARISON:  05/23/2017. FINDINGS: The cardiac silhouette remains mildly enlarged. Stable post CABG changes. The right jugular porta catheter is unchanged. The lungs are clear and the interstitial markings remain mildly prominent. Mild thoracic spine degenerative changes. IMPRESSION: No acute abnormality. Electronically Signed   By: Claudie Revering M.D.   On: 05/26/2017 20:52    Cardiac Studies   Vasc:  LE dopplers 9/22   No DVT or SVT   Echo:  LVEF 65 to 70%  Gr I DD  Trivial MR, TR    Patient Profile     61 y.o. female CAD status post CABG, chronic angina, renal cell CA who presented to the office today with chest pain, shortness of breath, dizziness suspicious for Unstable angina and edema suspicious for volume overload  Assessment & Plan    1CAD  Pt ruled out for MI  Echo normal  Review of "spells" she is having she showed me picture on  phone with welts on arm during spells  She has alos had on finger/hand    Pictures sugg spells not ischemic  May represent histamine release due t oallergy Will review with pharmacy   Consider Rx with Histamine blockers if no interference with meds   Keep picture to show to derm   2  HTN  BP meds have been pulled down as BP was low earlier  Follow   2  Edema  LE doppler neg for DVT  Echo pending   Anticipate d/c today   For questions or updates, please contact [place] Please consult [email or web address] for contact info under Cardiology/STEMI.      Signed, [name], MD  05/28/2017, 8:05 AM

## 2017-05-28 NOTE — Progress Notes (Signed)
Discharge instructions reviewed with patient, questions answered, verbalized understanding.  Patient transported to main entrance of hospital via wheelchair to be taken home by husband.

## 2017-06-08 DIAGNOSIS — Z5111 Encounter for antineoplastic chemotherapy: Secondary | ICD-10-CM | POA: Diagnosis not present

## 2017-06-08 DIAGNOSIS — C642 Malignant neoplasm of left kidney, except renal pelvis: Secondary | ICD-10-CM | POA: Diagnosis not present

## 2017-06-16 ENCOUNTER — Ambulatory Visit (INDEPENDENT_AMBULATORY_CARE_PROVIDER_SITE_OTHER): Payer: 59 | Admitting: Physician Assistant

## 2017-06-16 ENCOUNTER — Encounter: Payer: Self-pay | Admitting: Physician Assistant

## 2017-06-16 VITALS — BP 118/60 | HR 72 | Ht 68.0 in | Wt 204.0 lb

## 2017-06-16 DIAGNOSIS — R55 Syncope and collapse: Secondary | ICD-10-CM | POA: Diagnosis not present

## 2017-06-16 DIAGNOSIS — I5032 Chronic diastolic (congestive) heart failure: Secondary | ICD-10-CM | POA: Diagnosis not present

## 2017-06-16 DIAGNOSIS — I10 Essential (primary) hypertension: Secondary | ICD-10-CM

## 2017-06-16 DIAGNOSIS — I2510 Atherosclerotic heart disease of native coronary artery without angina pectoris: Secondary | ICD-10-CM

## 2017-06-16 DIAGNOSIS — E785 Hyperlipidemia, unspecified: Secondary | ICD-10-CM

## 2017-06-16 DIAGNOSIS — R21 Rash and other nonspecific skin eruption: Secondary | ICD-10-CM | POA: Diagnosis not present

## 2017-06-16 MED ORDER — FUROSEMIDE 40 MG PO TABS: 40.0000 mg | ORAL_TABLET | ORAL | 3 refills | Status: DC

## 2017-06-16 NOTE — Patient Instructions (Addendum)
Medication Instructions:  1. INCREASE LASIX TO 40 MG EVERY MON, WED AND FRI'S : 20 MG EVERY TUES, THUR, SAT AND SUN'S   Labwork: BMET, PRO BNP TO BE DONE NEXT WEEK WHEN YOU SEE YOU ONCOLOGIST ; [place] TO [place], [place]  Testing/Procedures: Your physician has recommended that you wear an event monitor. Event monitors are medical devices that record the heart's electrical activity. Doctors most often [place] these monitors to diagnose arrhythmias. Arrhythmias are problems with the speed or rhythm of the heartbeat. The monitor is a small, portable device. You can wear one while you do your normal daily activities. This is usually used to diagnose what is causing palpitations/syncope (passing out).    Follow-Up: 09/08/17 @ 4:20 WITH DR. Burt Knack   Any Other Special Instructions Will Be Listed Below (If Applicable).     If you need a refill on your cardiac medications before your next appointment, please call your pharmacy.

## 2017-06-16 NOTE — Progress Notes (Signed)
Cardiology Office Note:    Date:  06/16/2017   ID:  [name], DOB Jan 03, 1956, MRN [id number]  PCP:  Susy Frizzle, MD  Cardiologist:Dr. Sherren Mocha  Electrophysiologist: n/a  Oncologist: Dr. Nunzio Cobbs at [facility]  Referring MD: Susy Frizzle, MD   Chief Complaint  Patient presents with  . Hospitalization Follow-up    Chest pain, shortness of breath    History of Present Illness:    Sheri Hernandez is a 61 y.o. female with a hx of CAD status post CABG, chronic angina. She presented with a non-STEMI in 2016 and underwent complex PCI complicatedby coronary arterydissection and acute inferior STEMI. She underwent emergent CABG with SVG-RPDA/PLA. She had progressive angina in 2/18 and underwent cardiac catheterization demonstrating occlusion of the SVG-PDA and subtotal occlusion of the native RCA with left to right collaterals and tight stenosis of the native LCx which supplied a relatively small area of myocardium. Medical therapy was recommended.  She has a history of metastatic renal cell carcinoma and is followed by oncology at [place]. She has been on chemotherapy with nivolumab since 2015. Last seen by oncology in 8/18 and notes indicate recent imaging has demonstrated regressed disease.  She was seen in the emergency room 05/2017 with rash and chest discomfort as well as shortness of breath.  Chest x-ray was unremarkable and one troponin was negative.  She left without being seen by the provider.  I saw her in the office 05/26/17.  She continued to have significant chest discomfort concerning for angina as well as lower extremity edema.  She was admitted for further evaluation.    She was admitted 9/21-9/23.  Cardiac enzymes remained negative.  Lower extremity Dopplers were negative for DVT.  Echocardiogram demonstrated EF 65-70 with mild diastolic dysfunction.  She was given 2 doses of Lasix with weight reduction of 6 pounds.  She was placed on Claritin and  Pepcid due to recent history of rash.  It was recommended she follow-up with dermatology.  Amlodipine was stopped due to hypotension.     Sheri Hernandez returns for follow-up.  She is here alone.  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  She has been short of breath since discharge.  This is with more extreme activities.  She denies any further chest discomfort.  She sleeps on an incline chronically.  She sometimes awakens short of breath but this does not sound consistent with paroxysmal nocturnal dyspnea.  LE edema is improved.  She denies syncope.  Prior CV studies:   The following studies were reviewed today:  Echocardiogram 05/27/17 Vigorous LV function, EF 98-92, grade 1 diastolic dysfunction  LHC 10/11/16 LAD prox 40 LCx prox 90 RCA prox 100 with L-R collats S-RPDA 100 1. Severe 2 vessel CAD with tight stenosis of a small left circumflex and total occlusion of the proximal RCA (large dominant vessel) 2. Nonobstructive left main and LAD stenosis 3. Normal LV function by noninvasive assessment 4. Heavily calcified coronary arteries Medical Rx   Myoview 10/10/16 IMPRESSION: 1. Inferolateral defect on rest and stress images, worsening on stress images concerning for area of infarct with peri-infarct ischemia. 2. Decreased inferior wall motion. 3. Left ventricular ejection fraction 53% 4. Non invasive risk stratification*: Intermediate   Echo 10/09/16 Mild LVH, EF 55-60, inferior HK, grade 1 diastolic dysfunction, mild MR, mild LAE, PASP 22   Chest CTA 2/18 IMPRESSION: 1. No acute findings. No pulmonary embolism  seen, with mild study limitations detailed above. 2. Mild cardiomegaly, stable. Status post median sternotomy for presumed CABG. 3. Aortic atherosclerosis. No aortic aneurysm or evidence of aortic dissection. 4. Mild emphysematous change at each lung apex. Chronic scarring/atelectasis at the left lung base, stable or  perhaps slightly improved compared to the previous chest CT. No new lung findings. No evidence of pneumonia or pulmonary edema. 5. Fatty infiltration of the liver.   Echo 02/26/16 EF 55-60, normal wall motion, grade 1 diastolic dysfunction   Past Medical History:  Diagnosis Date  . Anxiety   . Arthritis    "maybe in my right hand" (05/26/2017)  . CAD (coronary artery disease)    a. 12/2014 Cath: LM nl, LAD 20p, 32m, D2 20ost, LCX 95p, 38m, RCA dominant, 95p, 62m/d, RPDA 95, RPL2 50, attempted RCA PCI w/ acute RCA occlusion-->CABG x 2 VG->RPDA->PLVB.  Marland Kitchen Hypertension   . Hypothyroidism   . Lung mass    LUNG MASSES--COUGH-BIOPSY NON-DIAGNOSTIC.  HX OF MEDIASTINAL MASS AND LUNG MASSES AGE 69 -NEGATIVE BIOSPY-BUT GIVEN DIAGNOSIS OF SARCOIDOSIS.  WORK UP CONTINUING ON DIAGNOSIS FOR PT'S LUNG MASSES--SHE HAS BIOPSY PROVEN KIDNEY CANCER  . Myocardial infarction ([place]) 12/2014 X 2  . 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  . Papilloma of breast 2012   left. lumpectomy  . Renal cancer ([place])    "stage IV" (05/26/2017)  . Renal cell cancer [facility])    metastatic renal cell carcinoma (biopsy of left renal mass 10/2012) On nivolumab at [facility]  . S/p nephrectomy    left, due to metastatic RCC, 3/14    Past Surgical History:  Procedure Laterality Date  . BREAST BIOPSY  08/18/2011   Procedure: BREAST BIOPSY WITH NEEDLE LOCALIZATION;  Surgeon: Merrie Roof, MD;  Location: [facility];  Service: General;  Laterality: Left;  left breast biopsy with needle localization  . BRONCHIAL BRUSH BIOPSY  2014  . CARDIAC CATHETERIZATION    . CORONARY ARTERY BYPASS GRAFT N/A 01/02/2015   Procedure: CORONARY ARTERY BYPASS GRAFTING (CABG)TIMES TWO USING RIGHT GREATER SAPHENOUS LEG VEIN HARVESTED ENDOSCOPICALLY;  Surgeon:  [name], MD;  Location: [facility];  Service: Open Heart Surgery;  Laterality: N/A;  . LAPAROSCOPIC NEPHRECTOMY Left 11/22/2012   Procedure: LAPAROSCOPIC NEPHRECTOMY;  Surgeon: Dutch Gray, MD;  Location: [facility];  Service: Urology;  Laterality: Left;  . LEFT HEART CATH AND CORS/GRAFTS ANGIOGRAPHY N/A 10/11/2016   Procedure: Left Heart Cath and Cors/Grafts Angiography;  Surgeon: Sherren Mocha, MD;  Location: [facility] CV LAB;  Service: Cardiovascular;  Laterality: N/A;  . LEFT HEART CATHETERIZATION WITH CORONARY ANGIOGRAM N/A 01/02/2015   Procedure: LEFT HEART CATHETERIZATION WITH CORONARY ANGIOGRAM;  Surgeon: Leonie Man, MD;  Location: [facility] CATH LAB;  Service: Cardiovascular;  Laterality: N/A;  . NECK LESION BIOPSY Left 08/2016  . PORTA CATH INSERTION Right ~ 2017  . TONSILLECTOMY  1967  . VIDEO BRONCHOSCOPY  10/11/2012   Procedure: VIDEO BRONCHOSCOPY WITH FLUORO;  Surgeon: Kathee Delton, MD;  Location: [facility] ENDOSCOPY;  Service: Cardiopulmonary;  Laterality: Bilateral;  . WIDS    . WISDOM TOOTH EXTRACTION     "all 4"    Current Medications: Current Meds  Medication Sig  . Acetaminophen (TYLENOL PO) Take 2 tablets by  mouth every 6 (six) hours as needed (headache).  Marland Kitchen aspirin 81 MG EC tablet Take 1 tablet (81 mg total) by mouth daily.  Marland Kitchen atorvastatin (LIPITOR) 40 MG tablet TAKE 1 TABLET (40 MG TOTAL) BY MOUTH DAILY.  Marland Kitchen clopidogrel (PLAVIX) 75 MG tablet Take 1 tablet (75 mg total) by mouth daily.  . cyclobenzaprine (FLEXERIL) 10 MG tablet Take 1 tablet (10 mg total) by mouth 3 (three) times daily as needed for muscle spasms.  . famotidine (PEPCID) 20 MG tablet Take 1 tablet (20 mg total) by mouth daily.  Marland Kitchen HYDROcodone-acetaminophen (NORCO/VICODIN) 5-325 MG tablet Take 1 tablet by mouth every 4 (four) hours as needed for moderate pain.  . isosorbide mononitrate (IMDUR) 30 MG 24 hr tablet Take 45 mg by mouth daily.   Marland Kitchen levothyroxine (SYNTHROID, LEVOTHROID) 137 MCG tablet TAKE 1 TABLET (137  MCG TOTAL) BY MOUTH DAILY BEFORE BREAKFAST.  Marland Kitchen lidocaine-prilocaine (EMLA) cream Apply 1 application topically See admin instructions. Apply cream to portacath site approximately 1 hour before access and cover  . loratadine (CLARITIN) 10 MG tablet Take 1 tablet (10 mg total) by mouth daily.  Marland Kitchen LORazepam (ATIVAN) 1 MG tablet Take 1 mg by mouth daily as needed for anxiety.   . metoprolol (LOPRESSOR) 50 MG tablet Take one and one-half tablet by mouth twice a day  . nitroGLYCERIN (NITROSTAT) 0.4 MG SL tablet Place 1 tablet (0.4 mg total) under the tongue every 5 (five) minutes as needed for chest pain.  Marland Kitchen oxycodone (OXY-IR) 5 MG capsule Take 5 mg by mouth daily as needed (severe pain). pain  . [DISCONTINUED] furosemide (LASIX) 20 MG tablet Take 1 tablet (20 mg total) by mouth daily.     Allergies:   Silver   Social History   Social History  . Marital status: Married    Spouse name: N/A  . Number of children: 0  . Years of education: N/A   Occupational History  . 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    Social History Main Topics  . Smoking status: Former Smoker    Packs/day: 1.50    Years: 40.00    Types: Cigarettes    Quit date: 09/24/2012  . Smokeless tobacco: Never Used  . Alcohol use Yes     Comment: 05/26/2017 "couple drinks/year"  . Drug use: No  . Sexual activity: Yes   Other Topics Concern  . None   Social History Narrative  . None     Family Hx: The patient's family history includes Aortic dissection in her brother; Breast cancer in her maternal aunt; Cancer in her maternal grandfather; Colon cancer in her maternal aunt; Heart attack in her father; Heart disease in her brother, father, maternal grandmother, and mother; Skin cancer in her maternal uncle; Stomach cancer in her maternal uncle; Stroke in her brother.  ROS:   Please see the history of present illness.    ROS All other systems reviewed and are negative.   EKGs/Labs/Other Test Reviewed:    EKG:   EKG is  ordered today.  The ekg ordered today demonstrates NSR< HR 72, normal axis, anterior Q waves, QTC 501 ms  Recent Labs: 05/27/2017: ALT 31; B Natriuretic Peptide 70.1; BUN 22; Creatinine, Ser 0.99; Potassium 3.5; Sodium 139; TSH 1.464 05/28/2017: Hemoglobin 14.6; Platelets 355   Recent Lipid Panel Lab Results  Component Value Date/Time   CHOL 90 01/03/2015 02:10 AM   TRIG 77 01/03/2015 02:10 AM   HDL 15 (L) 01/03/2015 02:10  AM   CHOLHDL 6.0 01/03/2015 02:10 AM   LDLCALC 60 01/03/2015 02:10 AM    Physical Exam:    VS:  BP 118/60   Pulse 72   Ht 5\' 8"  (1.727 m)   Wt 204 lb (92.5 kg)   BMI 31.02 kg/m     Wt Readings from Last 3 Encounters:  06/16/17 204 lb (92.5 kg)  05/28/17 200 lb 9.6 oz (91 kg)  05/26/17 206 lb 6.4 oz (93.6 kg)     Physical Exam  Constitutional: She is oriented to person, place, and time. She appears well-developed and well-nourished. No distress.  HENT:  Head: Normocephalic and atraumatic.  Eyes: No scleral icterus.  Neck: No JVD present.  Cardiovascular: Normal rate and regular rhythm.   No murmur heard. Pulmonary/Chest: Effort normal. She has no rales.  Abdominal: Soft.  Musculoskeletal: She exhibits edema (Trace-1+ bilateral LE edema).  Neurological: She is oriented to person, place, and time.  Skin: Skin is warm and dry.  Psychiatric: She has a normal mood and affect.    ASSESSMENT:    1. Chronic diastolic heart failure ([place])   2. Coronary artery disease involving native coronary artery of native heart without angina pectoris   3. Essential hypertension   4. Hyperlipidemia, unspecified hyperlipidemia type   5. Rash   6. Near syncope    PLAN:    In order of problems listed above:  1. Chronic diastolic heart failure ([place]) - Plan: EKG 12-Lead She appears to be stable from a volume standpoint on exam.  However, she does note some shortness of breath at times.  Overall, she is New York Heart Association class II.  I have recommended  proceeding with a basic metabolic panel and BNP today.  However, she declines having her blood drawn here.  She prefers to have this drawn next week when she goes to her oncologist.  I think she may feel better on a higher dose of Lasix per week.  Therefore, I have asked her to take Lasix 40 mg every Monday, Wednesday, Friday.  She can continue Lasix 20 mg all other days.  We will obtain a BMET and BNP at her oncologist's office next week for follow-up on renal function and potassium.  2. Coronary artery disease involving native coronary artery of native heart without angina pectoris s/p Inf STEMI c/b RCA dissection and emergent CABG.  LHC in 2/18 demonstrated occluded S-RCA. She has a history of chronic angina.  She had a recent admission with chest discomfort.  Cardiac enzymes were normal.  Echocardiogram demonstrated normal LV function.  She had improved symptoms with diuresis.  Currently, she denies recurrent angina.  Continue aspirin, Plavix, statin, beta-blocker, nitrates.  3. Essential hypertension The patient's blood pressure is controlled on her current regimen.  Continue current therapy.    4. Hyperlipidemia, unspecified hyperlipidemia type LDL optimal on most recent lab work.  Continue current Rx.    5. Rash She plans to follow up with Dr. Allyson Sabal (Dermatology).    6.  Near syncope Prior to leaving the office, she began to complain of another "episode."  She had one of these when we admitted her to the hospital recently.  She seems to become anxious.  Question if she is having a panic attack.  However, she states that she feels as though she may pass out.  We checked her oxygen saturation and her heart rate was in the 70s and her oxygen saturations were in the 90s.  Blood pressure was  stable.  I have recommended proceeding with an event monitor to rule out arrhythmia as a cause.  If this returns normal, she should follow-up with primary care for further investigation.  -  Arrange Event  Monitor   Dispo:  Return in about 3 months (around 09/16/2017) for Routine Follow Up, w/ Dr. Burt Knack.   Medication Adjustments/Labs and Tests Ordered: Current medicines are reviewed at length with the patient today.  Concerns regarding medicines are outlined above.  Tests Ordered: Orders Placed This Encounter  Procedures  . Cardiac event monitor  . EKG 12-Lead   Medication Changes: Meds ordered this encounter  Medications  . furosemide (LASIX) 40 MG tablet    Sig: Take 1 tablet (40 mg total) by mouth as directed. 40 mg every Mon, Wed and Fri; 20 mg Tue, thu, Sat and Sun    Dispense:  135 tablet    Refill:  3    DOSE CHANGE    Signed, Richardson Dopp, PA-C  06/16/2017 1:43 PM    [facility] [address], [place], [place]  [address] Phone: [phone]; Fax: [phone]

## 2017-06-19 ENCOUNTER — Telehealth: Payer: Self-pay | Admitting: Family Medicine

## 2017-06-19 NOTE — Telephone Encounter (Signed)
Patient is aware that [employer] called requesting we contact her with the free benefits from her employee she took the information down.  CB# [phone]

## 2017-06-19 NOTE — Telephone Encounter (Signed)
[employer] representative called requesting patients contact information,  next appointment, referrals when her last appointment was etc. I advised the representative that she would need to get that information from the patient. She said that she was unable to get in touch with patient. I advised her that I would be glad to call the patient with the nurse line [phone] 24/7 and case management [phone] and this is a free benefit to the patient. I told her that yes I would get the information to the patient.

## 2017-06-22 DIAGNOSIS — C642 Malignant neoplasm of left kidney, except renal pelvis: Secondary | ICD-10-CM | POA: Diagnosis not present

## 2017-06-22 DIAGNOSIS — R911 Solitary pulmonary nodule: Secondary | ICD-10-CM | POA: Diagnosis not present

## 2017-06-22 DIAGNOSIS — G6181 Chronic inflammatory demyelinating polyneuritis: Secondary | ICD-10-CM | POA: Diagnosis not present

## 2017-06-22 DIAGNOSIS — I5032 Chronic diastolic (congestive) heart failure: Secondary | ICD-10-CM | POA: Diagnosis not present

## 2017-06-29 ENCOUNTER — Ambulatory Visit (INDEPENDENT_AMBULATORY_CARE_PROVIDER_SITE_OTHER): Payer: 59

## 2017-06-29 DIAGNOSIS — R55 Syncope and collapse: Secondary | ICD-10-CM | POA: Diagnosis not present

## 2017-07-06 DIAGNOSIS — Z5111 Encounter for antineoplastic chemotherapy: Secondary | ICD-10-CM | POA: Diagnosis not present

## 2017-07-06 DIAGNOSIS — C642 Malignant neoplasm of left kidney, except renal pelvis: Secondary | ICD-10-CM | POA: Diagnosis not present

## 2017-07-25 DIAGNOSIS — C642 Malignant neoplasm of left kidney, except renal pelvis: Secondary | ICD-10-CM | POA: Diagnosis not present

## 2017-07-25 DIAGNOSIS — Z5111 Encounter for antineoplastic chemotherapy: Secondary | ICD-10-CM | POA: Diagnosis not present

## 2017-08-08 DIAGNOSIS — C641 Malignant neoplasm of right kidney, except renal pelvis: Secondary | ICD-10-CM | POA: Diagnosis not present

## 2017-08-08 DIAGNOSIS — C642 Malignant neoplasm of left kidney, except renal pelvis: Secondary | ICD-10-CM | POA: Diagnosis not present

## 2017-08-24 DIAGNOSIS — C642 Malignant neoplasm of left kidney, except renal pelvis: Secondary | ICD-10-CM | POA: Diagnosis not present

## 2017-08-24 DIAGNOSIS — R109 Unspecified abdominal pain: Secondary | ICD-10-CM | POA: Diagnosis not present

## 2017-08-24 DIAGNOSIS — R918 Other nonspecific abnormal finding of lung field: Secondary | ICD-10-CM | POA: Diagnosis not present

## 2017-08-24 DIAGNOSIS — R590 Localized enlarged lymph nodes: Secondary | ICD-10-CM | POA: Diagnosis not present

## 2017-08-24 DIAGNOSIS — C641 Malignant neoplasm of right kidney, except renal pelvis: Secondary | ICD-10-CM | POA: Diagnosis not present

## 2017-09-07 DIAGNOSIS — Z5111 Encounter for antineoplastic chemotherapy: Secondary | ICD-10-CM | POA: Diagnosis not present

## 2017-09-07 DIAGNOSIS — C642 Malignant neoplasm of left kidney, except renal pelvis: Secondary | ICD-10-CM | POA: Diagnosis not present

## 2017-09-08 ENCOUNTER — Encounter: Payer: Self-pay | Admitting: Cardiovascular Disease

## 2017-09-08 ENCOUNTER — Ambulatory Visit (INDEPENDENT_AMBULATORY_CARE_PROVIDER_SITE_OTHER): Payer: 59 | Admitting: Cardiovascular Disease

## 2017-09-08 VITALS — BP 126/80 | HR 93 | Ht 68.0 in | Wt 213.0 lb

## 2017-09-08 DIAGNOSIS — I10 Essential (primary) hypertension: Secondary | ICD-10-CM

## 2017-09-08 DIAGNOSIS — I5032 Chronic diastolic (congestive) heart failure: Secondary | ICD-10-CM

## 2017-09-08 DIAGNOSIS — I2510 Atherosclerotic heart disease of native coronary artery without angina pectoris: Secondary | ICD-10-CM | POA: Diagnosis not present

## 2017-09-08 MED ORDER — METOPROLOL TARTRATE 50 MG PO TABS: 75.0000 mg | ORAL_TABLET | Freq: Two times a day (BID) | ORAL | 3 refills | Status: DC

## 2017-09-08 MED ORDER — FUROSEMIDE 40 MG PO TABS: 40.0000 mg | ORAL_TABLET | Freq: Every day | ORAL | 3 refills | Status: DC

## 2017-09-08 NOTE — Progress Notes (Signed)
Cardiology Office Note Date:  09/08/2017   ID:  Sheri Hernandez, DOB 1956/03/03, MRN [id number]  PCP:  Susy Frizzle, MD  Cardiologist:  Sherren Mocha, MD    Chief Complaint  Patient presents with  . Follow-up     History of Present Illness: Sheri Hernandez is a 62 y.o. female who presents for follow-up of coronary artery disease and chronic angina.  The patient has a history of non-STEMI in 2016 when she underwent complex PCI of the right coronary artery complicated by extensive dissection and an acute inferior STEMI.  She required emergency bypass surgery with a saphenous vein graft to right PDA/PLA.  She developed progressive angina about 1 year ago and cardiac catheterization demonstrated total occlusion of her bypass graft with subtotal occlusion of the native right coronary artery in an area of severe calcification.  Medical therapy was recommended.  The patient is here alone today.  She was hospitalized in September and her beta-blocker was reduced.  Since that time she has noticed more anginal chest discomfort.  This occurs with physical exertion.  She also has shortness of breath.  She complains of right leg swelling but she has not been taking her diuretic as prescribed.  She is not able to get compression stockings on.  She has not been following a low-sodium diet.   Past Medical History:  Diagnosis Date  . Anxiety   . Arthritis    "maybe in my right hand" (05/26/2017)  . CAD (coronary artery disease)    a. 12/2014 Cath: LM nl, LAD 20p, 45m, D2 20ost, LCX 95p, 98m, RCA dominant, 95p, 56m/d, RPDA 95, RPL2 50, attempted RCA PCI w/ acute RCA occlusion-->CABG x 2 VG->RPDA->PLVB.  Marland Kitchen CHF (congestive heart failure) ([place])   . Hypertension   . Hypothyroidism   . Lung mass    LUNG MASSES--COUGH-BIOPSY NON-DIAGNOSTIC.  HX OF MEDIASTINAL MASS AND LUNG MASSES AGE 28 -NEGATIVE BIOSPY-BUT GIVEN DIAGNOSIS OF SARCOIDOSIS.  WORK UP CONTINUING ON DIAGNOSIS FOR PT'S LUNG MASSES--SHE HAS  BIOPSY PROVEN KIDNEY CANCER  . Myocardial infarction ([place]) 12/2014 X 2  . 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  . Papilloma of breast 2012   left. lumpectomy  . Renal cancer ([place])    "stage IV" (05/26/2017)  . Renal cell cancer [facility])    metastatic renal cell carcinoma (biopsy of left renal mass 10/2012) On nivolumab at [facility]  . S/p nephrectomy    left, due to metastatic RCC, 3/14    Past Surgical History:  Procedure Laterality Date  . BREAST BIOPSY  08/18/2011   Procedure: BREAST BIOPSY WITH NEEDLE LOCALIZATION;  Surgeon: Merrie Roof, MD;  Location: [place];  Service: General;  Laterality: Left;  left breast biopsy with needle localization  . BRONCHIAL BRUSH BIOPSY  2014  . CARDIAC CATHETERIZATION    . CORONARY ARTERY BYPASS GRAFT N/A 01/02/2015   Procedure: CORONARY ARTERY BYPASS GRAFTING (CABG)TIMES TWO USING RIGHT GREATER SAPHENOUS LEG VEIN HARVESTED ENDOSCOPICALLY;  Surgeon: [name], MD;  Location: [place];  Service: Open Heart Surgery;  Laterality: N/A;  . LAPAROSCOPIC NEPHRECTOMY Left 11/22/2012   Procedure: LAPAROSCOPIC NEPHRECTOMY;  Surgeon: Dutch Gray, MD;  Location: [facility];  Service: Urology;  Laterality: Left;  . LEFT HEART  CATH AND CORS/GRAFTS ANGIOGRAPHY N/A 10/11/2016   Procedure: Left Heart Cath and Cors/Grafts Angiography;  Surgeon: Sherren Mocha, MD;  Location: [place] [facility];  Service: Cardiovascular;  Laterality: N/A;  . LEFT HEART CATHETERIZATION WITH CORONARY ANGIOGRAM N/A 01/02/2015   Procedure: LEFT HEART CATHETERIZATION WITH CORONARY ANGIOGRAM;  Surgeon: Leonie Man, MD;  Location: [facility] CATH LAB;  Service: Cardiovascular;  Laterality: N/A;  . NECK LESION BIOPSY Left 08/2016  . PORTA CATH INSERTION Right ~ 2017  . TONSILLECTOMY  1967  . VIDEO  BRONCHOSCOPY  10/11/2012   Procedure: VIDEO BRONCHOSCOPY WITH FLUORO;  Surgeon: Kathee Delton, MD;  Location: [facility];  Service: Cardiopulmonary;  Laterality: Bilateral;  . WIDS    . WISDOM TOOTH EXTRACTION     "all 4"    Current Outpatient Medications  Medication Sig Dispense Refill  . Acetaminophen (TYLENOL PO) Take 2 tablets by mouth every 6 (six) hours as needed (headache).    Marland Kitchen aspirin 81 MG EC tablet Take 1 tablet (81 mg total) by mouth daily.    Marland Kitchen atorvastatin (LIPITOR) 40 MG tablet TAKE 1 TABLET (40 MG TOTAL) BY MOUTH DAILY. 90 tablet 3  . Cholecalciferol (VITAMIN D PO) Take 1 tablet by mouth daily.    . clopidogrel (PLAVIX) 75 MG tablet Take 1 tablet (75 mg total) by mouth daily. 30 tablet 8  . cyclobenzaprine (FLEXERIL) 10 MG tablet Take 1 tablet (10 mg total) by mouth 3 (three) times daily as needed for muscle spasms. 30 tablet 1  . famotidine (PEPCID) 20 MG tablet Take 1 tablet (20 mg total) by mouth daily. 30 tablet 2  . furosemide (LASIX) 40 MG tablet Take 1 tablet (40 mg total) by mouth as directed. 40 mg every Mon, Wed and Fri; 20 mg Tue, thu, Sat and Sun 135 tablet 3  . HYDROcodone-acetaminophen (NORCO/VICODIN) 5-325 MG tablet Take 1 tablet by mouth every 4 (four) hours as needed for moderate pain. 20 tablet 0  . isosorbide mononitrate (IMDUR) 30 MG 24 hr tablet Take 45 mg by mouth daily.     Marland Kitchen levothyroxine (SYNTHROID, LEVOTHROID) 137 MCG tablet TAKE 1 TABLET (137 MCG TOTAL) BY MOUTH DAILY BEFORE BREAKFAST. 30 tablet 5  . lidocaine-prilocaine (EMLA) cream Apply 1 application topically See admin instructions. Apply cream to portacath site approximately 1 hour before access and cover  3  . loratadine (CLARITIN) 10 MG tablet Take 1 tablet (10 mg total) by mouth daily. 30 tablet 2  . LORazepam (ATIVAN) 1 MG tablet Take 1 mg by mouth daily as needed for anxiety.     . metoprolol (LOPRESSOR) 50 MG tablet Take one and one-half tablet by mouth twice a day 270 tablet 2  .  nitroGLYCERIN (NITROSTAT) 0.4 MG SL tablet Place 1 tablet (0.4 mg total) under the tongue every 5 (five) minutes as needed for chest pain. 25 tablet 1  . oxycodone (OXY-IR) 5 MG capsule Take 5 mg by mouth daily as needed (severe pain). pain     No current facility-administered medications for this visit.     Allergies:   Silver   Social History:  The patient  reports that she quit smoking about 4 years ago. Her smoking use included cigarettes. She has a 60.00 pack-year smoking history. she has never used smokeless tobacco. She reports that she drinks alcohol. She reports that she does not use drugs.   Family History:  The patient's  family history includes Aortic dissection in her brother; Breast cancer in her  maternal aunt; Cancer in her maternal grandfather; Colon cancer in her maternal aunt; Heart attack in her father; Heart disease in her brother, father, maternal grandmother, and mother; Skin cancer in her maternal uncle; Stomach cancer in her maternal uncle; Stroke in her brother.    ROS:  Please see the history of present illness.  All other systems are reviewed and negative.    PHYSICAL EXAM: VS:  BP 126/80   Pulse 93   Ht 5\' 8"  (1.727 m)   Wt 213 lb (96.6 kg)   BMI 32.39 kg/m  , BMI Body mass index is 32.39 kg/m. GEN: Well nourished, well developed, in no acute distress  HEENT: normal  Neck: no JVD, no masses. No carotid bruits Cardiac: RRR without murmur or gallop                Respiratory:  clear to auscultation bilaterally, normal work of breathing GI: soft, nontender, nondistended, + BS MS: no deformity or atrophy  Ext: 2+ right pretibial edema, 1+ left pretibial edema Skin: warm and dry, no rash Neuro:  Strength and sensation are intact Psych: euthymic mood, full affect  EKG:  EKG is not ordered today.  Recent Labs: 05/27/2017: ALT 31; B Natriuretic Peptide 70.1; BUN 22; Creatinine, Ser 0.99; Potassium 3.5; Sodium 139; TSH 1.464 05/28/2017: Hemoglobin 14.6;  Platelets 355   Lipid Panel     Component Value Date/Time   CHOL 90 01/03/2015 0210   TRIG 77 01/03/2015 0210   HDL 15 (L) 01/03/2015 0210   CHOLHDL 6.0 01/03/2015 0210   VLDL 15 01/03/2015 0210   LDLCALC 60 01/03/2015 0210      Wt Readings from Last 3 Encounters:  09/08/17 213 lb (96.6 kg)  06/16/17 204 lb (92.5 kg)  05/28/17 200 lb 9.6 oz (91 kg)     Cardiac Studies Reviewed: Echo 05-27-2017: Study Conclusions  - Left ventricle: The cavity size was normal. Wall thickness was   normal. Systolic function was vigorous. The estimated ejection   fraction was in the range of 65% to 70%. Doppler parameters are   consistent with abnormal left ventricular relaxation (grade 1   diastolic dysfunction).  ASSESSMENT AND PLAN: 1.  Coronary artery disease, native vessel and bypass graft disease, with angina: Medications are reviewed.  She was doing better on 75 mg of metoprolol twice daily and this medicine is increased back to that dose today.  I will see her back in 6 months.  She will continue on isosorbide, aspirin, clopidogrel, and a high intensity statin drug.  She is advised to call if her anginal symptoms do not improve with the increased beta-blocker.  2.  Chronic diastolic heart failure: She does have more edema on exam.  Advised her to resume furosemide 40 mg daily.  Will check a metabolic panel in 2 weeks.  Low-sodium diet as discussed.  3.  Essential hypertension: Blood pressure controlled on current therapy.  4.  Hyperlipidemia: Continue a high intensity statin drug.  Current medicines are reviewed with the patient today.  The patient does not have concerns regarding medicines.  Labs/ tests ordered today include:  No orders of the defined types were placed in this encounter.   Disposition:   FU 6 months  Signed, Sherren Mocha, MD  09/08/2017 4:39 PM    [facility], [place], [place]  [address] Phone: [phone]; Fax: [phone]

## 2017-09-08 NOTE — Patient Instructions (Addendum)
Medication Instructions:  1) INCREASE LASIX to 40 mg daily 2) INCREASE METOPROLOL to 75 mg twice daily  Labwork: Your provider recommends that you return for lab work in 2 weeks.  Testing/Procedures: None  Follow-Up: Your provider wants you to follow-up in: 6 months with Dr. Burt Knack. You will receive a reminder letter in the mail two months in advance. If you don't receive a letter, please call our office to schedule the follow-up appointment.    Any Other Special Instructions Will Be Listed Below (If Applicable).     If you need a refill on your cardiac medications before your next appointment, please call your pharmacy.

## 2017-09-21 DIAGNOSIS — Z95828 Presence of other vascular implants and grafts: Secondary | ICD-10-CM | POA: Diagnosis not present

## 2017-09-21 DIAGNOSIS — C642 Malignant neoplasm of left kidney, except renal pelvis: Secondary | ICD-10-CM | POA: Diagnosis not present

## 2017-09-21 DIAGNOSIS — Z79899 Other long term (current) drug therapy: Secondary | ICD-10-CM | POA: Diagnosis not present

## 2017-10-05 DIAGNOSIS — L821 Other seborrheic keratosis: Secondary | ICD-10-CM | POA: Diagnosis not present

## 2017-10-05 DIAGNOSIS — L814 Other melanin hyperpigmentation: Secondary | ICD-10-CM | POA: Diagnosis not present

## 2017-10-05 DIAGNOSIS — C642 Malignant neoplasm of left kidney, except renal pelvis: Secondary | ICD-10-CM | POA: Diagnosis not present

## 2017-10-05 DIAGNOSIS — L820 Inflamed seborrheic keratosis: Secondary | ICD-10-CM | POA: Diagnosis not present

## 2017-10-05 DIAGNOSIS — D225 Melanocytic nevi of trunk: Secondary | ICD-10-CM | POA: Diagnosis not present

## 2017-10-05 DIAGNOSIS — Z5111 Encounter for antineoplastic chemotherapy: Secondary | ICD-10-CM | POA: Diagnosis not present

## 2017-10-10 ENCOUNTER — Other Ambulatory Visit: Payer: Self-pay | Admitting: Cardiovascular Disease

## 2017-10-10 MED ORDER — CLOPIDOGREL BISULFATE 75 MG PO TABS: 75.0000 mg | ORAL_TABLET | Freq: Every day | ORAL | 3 refills | Status: DC

## 2017-10-11 ENCOUNTER — Other Ambulatory Visit: Payer: Self-pay | Admitting: Family Medicine

## 2017-10-11 MED ORDER — LEVOTHYROXINE SODIUM 137 MCG PO TABS: ORAL_TABLET | ORAL | 1 refills | Status: DC

## 2017-10-19 DIAGNOSIS — C649 Malignant neoplasm of unspecified kidney, except renal pelvis: Secondary | ICD-10-CM | POA: Diagnosis not present

## 2017-10-19 DIAGNOSIS — C642 Malignant neoplasm of left kidney, except renal pelvis: Secondary | ICD-10-CM | POA: Diagnosis not present

## 2017-10-19 DIAGNOSIS — Z79899 Other long term (current) drug therapy: Secondary | ICD-10-CM | POA: Diagnosis not present

## 2017-10-19 DIAGNOSIS — R739 Hyperglycemia, unspecified: Secondary | ICD-10-CM | POA: Diagnosis not present

## 2017-10-30 ENCOUNTER — Other Ambulatory Visit: Payer: Self-pay | Admitting: Cardiovascular Disease

## 2017-10-30 DIAGNOSIS — I25708 Atherosclerosis of coronary artery bypass graft(s), unspecified, with other forms of angina pectoris: Secondary | ICD-10-CM

## 2017-10-30 DIAGNOSIS — E785 Hyperlipidemia, unspecified: Secondary | ICD-10-CM

## 2017-10-30 NOTE — Telephone Encounter (Signed)
Pt's medication was sent to pt's pharmacy as requested. Confirmation received.  °

## 2017-11-02 DIAGNOSIS — C642 Malignant neoplasm of left kidney, except renal pelvis: Secondary | ICD-10-CM | POA: Diagnosis not present

## 2017-11-02 DIAGNOSIS — Z5111 Encounter for antineoplastic chemotherapy: Secondary | ICD-10-CM | POA: Diagnosis not present

## 2017-11-02 DIAGNOSIS — R739 Hyperglycemia, unspecified: Secondary | ICD-10-CM | POA: Diagnosis not present

## 2017-11-08 ENCOUNTER — Other Ambulatory Visit: Payer: Self-pay | Admitting: Family Medicine

## 2017-11-08 DIAGNOSIS — Z1231 Encounter for screening mammogram for malignant neoplasm of breast: Secondary | ICD-10-CM

## 2017-11-16 DIAGNOSIS — C642 Malignant neoplasm of left kidney, except renal pelvis: Secondary | ICD-10-CM | POA: Diagnosis not present

## 2017-11-16 DIAGNOSIS — R739 Hyperglycemia, unspecified: Secondary | ICD-10-CM | POA: Diagnosis not present

## 2017-11-16 DIAGNOSIS — C641 Malignant neoplasm of right kidney, except renal pelvis: Secondary | ICD-10-CM | POA: Diagnosis not present

## 2017-11-30 DIAGNOSIS — Z888 Allergy status to other drugs, medicaments and biological substances status: Secondary | ICD-10-CM | POA: Diagnosis not present

## 2017-11-30 DIAGNOSIS — C642 Malignant neoplasm of left kidney, except renal pelvis: Secondary | ICD-10-CM | POA: Diagnosis not present

## 2017-11-30 DIAGNOSIS — Z5112 Encounter for antineoplastic immunotherapy: Secondary | ICD-10-CM | POA: Diagnosis not present

## 2017-12-07 ENCOUNTER — Other Ambulatory Visit: Payer: Self-pay | Admitting: Nurse Practitioner

## 2017-12-07 NOTE — Telephone Encounter (Signed)
Pt's pharmacy is requesting a refill on loratadine and famotidine. These medications were prescribed by christopher berge, PA, while pt was in the hospital. Would Dr. Burt Knack like to refill these medications? Please address

## 2017-12-11 ENCOUNTER — Telehealth: Payer: Self-pay | Admitting: Family Medicine

## 2017-12-11 NOTE — Telephone Encounter (Signed)
Med refill on levothyroxine to [facility] [facility] [address]

## 2017-12-12 MED ORDER — LEVOTHYROXINE SODIUM 137 MCG PO TABS: ORAL_TABLET | ORAL | 0 refills | Status: DC

## 2017-12-12 NOTE — Telephone Encounter (Signed)
Med refilled for 30 days only. Has been over a year since LOV. Needs labs and ov before further refills.

## 2017-12-14 DIAGNOSIS — E1165 Type 2 diabetes mellitus with hyperglycemia: Secondary | ICD-10-CM | POA: Diagnosis not present

## 2017-12-14 DIAGNOSIS — C642 Malignant neoplasm of left kidney, except renal pelvis: Secondary | ICD-10-CM | POA: Diagnosis not present

## 2017-12-14 DIAGNOSIS — C641 Malignant neoplasm of right kidney, except renal pelvis: Secondary | ICD-10-CM | POA: Diagnosis not present

## 2017-12-14 DIAGNOSIS — Z1231 Encounter for screening mammogram for malignant neoplasm of breast: Secondary | ICD-10-CM | POA: Diagnosis not present

## 2017-12-28 DIAGNOSIS — Z5111 Encounter for antineoplastic chemotherapy: Secondary | ICD-10-CM | POA: Diagnosis not present

## 2017-12-28 DIAGNOSIS — C642 Malignant neoplasm of left kidney, except renal pelvis: Secondary | ICD-10-CM | POA: Diagnosis not present

## 2018-01-05 DIAGNOSIS — L718 Other rosacea: Secondary | ICD-10-CM | POA: Diagnosis not present

## 2018-01-05 DIAGNOSIS — L309 Dermatitis, unspecified: Secondary | ICD-10-CM | POA: Diagnosis not present

## 2018-01-05 DIAGNOSIS — L821 Other seborrheic keratosis: Secondary | ICD-10-CM | POA: Diagnosis not present

## 2018-01-10 ENCOUNTER — Other Ambulatory Visit: Payer: Self-pay | Admitting: Family Medicine

## 2018-01-10 ENCOUNTER — Telehealth: Payer: Self-pay | Admitting: Family Medicine

## 2018-01-10 MED ORDER — LEVOTHYROXINE SODIUM 137 MCG PO TABS: ORAL_TABLET | ORAL | 0 refills | Status: DC

## 2018-01-10 NOTE — Telephone Encounter (Signed)
Pt needs refill on synthroid to [facility].

## 2018-01-10 NOTE — Telephone Encounter (Signed)
Medication called/sent to requested pharmacy  

## 2018-01-11 DIAGNOSIS — C642 Malignant neoplasm of left kidney, except renal pelvis: Secondary | ICD-10-CM | POA: Diagnosis not present

## 2018-01-11 DIAGNOSIS — R52 Pain, unspecified: Secondary | ICD-10-CM | POA: Diagnosis not present

## 2018-01-11 DIAGNOSIS — R21 Rash and other nonspecific skin eruption: Secondary | ICD-10-CM | POA: Diagnosis not present

## 2018-01-14 ENCOUNTER — Other Ambulatory Visit: Payer: Self-pay | Admitting: Physician Assistant

## 2018-01-25 DIAGNOSIS — C642 Malignant neoplasm of left kidney, except renal pelvis: Secondary | ICD-10-CM | POA: Diagnosis not present

## 2018-01-25 DIAGNOSIS — Z5111 Encounter for antineoplastic chemotherapy: Secondary | ICD-10-CM | POA: Diagnosis not present

## 2018-01-31 ENCOUNTER — Ambulatory Visit: Payer: 59 | Admitting: Physician Assistant

## 2018-01-31 ENCOUNTER — Encounter: Payer: Self-pay | Admitting: Physician Assistant

## 2018-01-31 ENCOUNTER — Other Ambulatory Visit: Payer: Self-pay

## 2018-01-31 VITALS — BP 122/88 | HR 85 | Temp 98.3°F | Resp 16 | Ht 68.0 in | Wt 202.8 lb

## 2018-01-31 DIAGNOSIS — W57XXXA Bitten or stung by nonvenomous insect and other nonvenomous arthropods, initial encounter: Secondary | ICD-10-CM

## 2018-01-31 DIAGNOSIS — S40261A Insect bite (nonvenomous) of right shoulder, initial encounter: Secondary | ICD-10-CM

## 2018-01-31 MED ORDER — DOXYCYCLINE HYCLATE 100 MG PO TABS: 100.0000 mg | ORAL_TABLET | Freq: Two times a day (BID) | ORAL | 0 refills | Status: DC

## 2018-01-31 NOTE — Progress Notes (Signed)
Patient ID: Sheri Hernandez MRN: [id number], DOB: [birth year], 62 y.o. Date of Encounter: 01/31/2018, 4:20 PM    Chief Complaint:  Chief Complaint  Patient presents with  . tick bite on right shoulder    happened monday      HPI: 62 y.o. year old female presents with above.   States that this past Monday she found a tick on top of her right shoulder.  Her husband tried to pull off the tick but thinks that he did not get the entire thing and that some of it may be still stuck in her skin.  Since then the skin around the site has become red and sore.  She has seen no other areas of rash or abnormality on the skin. She has had no fevers. She has had no myalgias or malaise or flulike illness symptoms.     Home Meds:   Outpatient Medications Prior to Visit  Medication Sig Dispense Refill  . Acetaminophen (TYLENOL PO) Take 2 tablets by mouth every 6 (six) hours as needed (headache).    Marland Kitchen aspirin 81 MG EC tablet Take 1 tablet (81 mg total) by mouth daily.    Marland Kitchen atorvastatin (LIPITOR) 40 MG tablet TAKE 1 TABLET (40 MG TOTAL) BY MOUTH DAILY. 90 tablet 3  . Cholecalciferol (VITAMIN D PO) Take 1 tablet by mouth daily.    . clopidogrel (PLAVIX) 75 MG tablet Take 1 tablet (75 mg total) by mouth daily. 90 tablet 3  . cyclobenzaprine (FLEXERIL) 10 MG tablet Take 1 tablet (10 mg total) by mouth 3 (three) times daily as needed for muscle spasms. 30 tablet 1  . famotidine (PEPCID) 20 MG tablet Take 1 tablet (20 mg total) by mouth daily. 30 tablet 2  . furosemide (LASIX) 40 MG tablet Take 1 tablet (40 mg total) by mouth daily. 90 tablet 3  . HYDROcodone-acetaminophen (NORCO/VICODIN) 5-325 MG tablet Take 1 tablet by mouth every 4 (four) hours as needed for moderate pain. 20 tablet 0  . isosorbide mononitrate (IMDUR) 30 MG 24 hr tablet TAKE 1 AND 1/2 TABLET BY MOUTH EVERY DAY 135 tablet 3  . levothyroxine (SYNTHROID, LEVOTHROID) 137 MCG tablet TAKE 1 TABLET (137 MCG TOTAL) BY MOUTH DAILY BEFORE  BREAKFAST. 30 tablet 0  . lidocaine-prilocaine (EMLA) cream Apply 1 application topically See admin instructions. Apply cream to portacath site approximately 1 hour before access and cover  3  . loratadine (CLARITIN) 10 MG tablet Take 1 tablet (10 mg total) by mouth daily. 30 tablet 2  . metoprolol tartrate (LOPRESSOR) 50 MG tablet Take 1.5 tablets (75 mg total) by mouth 2 (two) times daily. 270 tablet 3  . nitroGLYCERIN (NITROSTAT) 0.4 MG SL tablet PLACE 1 TABLET (0.4 MG TOTAL) UNDER THE TONGUE EVERY 5 (FIVE) MINUTES AS NEEDED FOR CHEST PAIN. 25 tablet 5  . oxycodone (OXY-IR) 5 MG capsule Take 5 mg by mouth daily as needed (severe pain). pain    . LORazepam (ATIVAN) 1 MG tablet Take 1 mg by mouth daily as needed for anxiety.      No facility-administered medications prior to visit.     Allergies:  Allergies  Allergen Reactions  . Silver Dermatitis    Please use mepalex      Review of Systems: See HPI for pertinent ROS. All other ROS negative.    Physical Exam: Blood pressure 122/88, pulse 85, temperature 98.3 F (36.8 C), temperature source Oral, resp. rate 16, height 5\' 8"  (1.727 m), weight 92 kg (  202 lb 12.8 oz), SpO2 95 %., Body mass index is 30.84 kg/m. General: WF.  Appears in no acute distress. Neck: Supple. No thyromegaly. No lymphadenopathy. Lungs: Clear bilaterally to auscultation without wheezes, rales, or rhonchi. Breathing is unlabored. Heart: Regular rhythm. No murmurs, rubs, or gallops. Msk:  Strength and tone normal for age. Skin: Top of Right Shoulder: There is approx 1 inch diameter area of diffuse erythema. 37mm brown-black dot in center.  No other skin abnormality--no rash Neuro: Alert and oriented X 3. Moves all extremities spontaneously. Gait is normal. CNII-XII grossly in tact. Psych:  Responds to questions appropriately with a normal affect.     ASSESSMENT AND PLAN:  62 y.o. year old female with  1. Tick bite, initial encounter Site is consistent with  infected tick bite.  Also consistent with some residual part of the tick remaining in the skin. Sharp pointed scalpel was used to extract remaining piece of tick from the site. Peroxide was then applied to further extract any remaining pieces. She is to take doxycycline to treat infected site.  I do not think that this site is a rash related to Lyme or [facility] spotted fever.  Rather it is related to an infected site.  However will use doxycycline as this would treat cellulitis/skin infection as well as Lyme or [facility] spotted fever.  To take the doxycycline as directed.  Follow-up if site does not return to normal or if she is having experiencing any of the other signs or symptoms of Lyme or [facility] spotted fever as directed discussed, reviewed in HPI. - doxycycline (VIBRA-TABS) 100 MG tablet; Take 1 tablet (100 mg total) by mouth 2 (two) times daily.  Dispense: 20 tablet; Refill: 0   Signed, [address] [place], [place], [facility] 01/31/2018 4:20 PM

## 2018-02-02 ENCOUNTER — Ambulatory Visit: Payer: 59 | Admitting: Cardiovascular Disease

## 2018-02-02 ENCOUNTER — Encounter: Payer: Self-pay | Admitting: Cardiovascular Disease

## 2018-02-02 VITALS — BP 124/88 | HR 75 | Ht 68.0 in | Wt 202.8 lb

## 2018-02-02 DIAGNOSIS — I209 Angina pectoris, unspecified: Secondary | ICD-10-CM

## 2018-02-02 MED ORDER — ISOSORBIDE MONONITRATE ER 60 MG PO TB24: 60.0000 mg | ORAL_TABLET | Freq: Every day | ORAL | 3 refills | Status: DC

## 2018-02-02 NOTE — Patient Instructions (Signed)
Medication Instructions:  1) INCREASE ISOSORBIDE to 60 mg daily 2) You may increase your Lasix to twice daily for up to 3 days if you experience swelling   Labwork: You have been given a prescription for fasting lab work.  Testing/Procedures: None  Follow-Up: Your provider wants you to follow-up in: 6 months with Dr. Burt Knack.  You will receive a reminder letter in the mail two months in advance. If you don't receive a letter, please call our office to schedule the follow-up appointment.    Any Other Special Instructions Will Be Listed Below (If Applicable).     If you need a refill on your cardiac medications before your next appointment, please call your pharmacy.

## 2018-02-02 NOTE — Progress Notes (Signed)
Cardiology Office Note Date:  02/02/2018   ID:  Sheri Hernandez, DOB Jan 04, 1956, MRN [id number]  PCP:  Susy Frizzle, MD  Cardiologist:  Sherren Mocha, MD    Chief Complaint  Patient presents with  . Coronary Artery Disease   History of Present Illness: Sheri Hernandez is a 62 y.o. female who presents for  follow-up of coronary artery disease and chronic angina.  The patient has a history of non-STEMI in 2016 when she underwent complex PCI of the right coronary artery complicated by extensive dissection and an acute inferior STEMI.  She required emergency bypass surgery with a saphenous vein graft to right PDA/PLA.  She developed progressive angina about 1 year ago and cardiac catheterization demonstrated total occlusion of her bypass graft with subtotal occlusion of the native right coronary artery in an area of severe calcification.  Medical therapy was recommended.  The patient is here alone today.  She has had a few episodes of chest tightness recently.  She is taken 2 nitroglycerin in the last month.  She feels these episodes were related to increased stress in her life.  She has stable symptoms of chest discomfort with moderate level physical exertion.  She denies any shortness of breath at rest, orthopnea, or PND.  She has stable leg swelling.  No recent heart palpitations.   Past Medical History:  Diagnosis Date  . Anxiety   . Arthritis    "maybe in my right hand" (05/26/2017)  . CAD (coronary artery disease)    a. 12/2014 Cath: LM nl, LAD 20p, 10m, D2 20ost, LCX 95p, 42m, RCA dominant, 95p, 61m/d, RPDA 95, RPL2 50, attempted RCA PCI w/ acute RCA occlusion-->CABG x 2 VG->RPDA->PLVB.  Marland Kitchen CHF (congestive heart failure) ([place])   . Hypertension   . Hypothyroidism   . Lung mass    LUNG MASSES--COUGH-BIOPSY NON-DIAGNOSTIC.  HX OF MEDIASTINAL MASS AND LUNG MASSES AGE 61 -NEGATIVE BIOSPY-BUT GIVEN DIAGNOSIS OF SARCOIDOSIS.  WORK UP CONTINUING ON DIAGNOSIS FOR PT'S LUNG MASSES--SHE  HAS BIOPSY PROVEN KIDNEY CANCER  . Myocardial infarction ([place]) 12/2014 X 2  . 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  . Papilloma of breast 2012   left. lumpectomy  . Renal cancer ([place])    "stage IV" (05/26/2017)  . Renal cell cancer [facility])    metastatic renal cell carcinoma (biopsy of left renal mass 10/2012) On nivolumab at  [facility]  . S/p nephrectomy    left, due to metastatic RCC, 3/14    Past Surgical History:  Procedure Laterality Date  . BREAST BIOPSY  08/18/2011   Procedure: BREAST BIOPSY WITH NEEDLE LOCALIZATION;  Surgeon: Merrie Roof, MD;  Location: [place];  Service: General;  Laterality: Left;  left breast biopsy with needle localization  . BRONCHIAL BRUSH BIOPSY  2014  . CARDIAC CATHETERIZATION    . CORONARY ARTERY BYPASS GRAFT N/A 01/02/2015   Procedure: CORONARY ARTERY BYPASS GRAFTING (CABG)TIMES TWO USING RIGHT GREATER SAPHENOUS LEG VEIN HARVESTED ENDOSCOPICALLY;  Surgeon: [name], MD;  Location: [place];  Service: Open Heart Surgery;  Laterality: N/A;  . LAPAROSCOPIC NEPHRECTOMY Left 11/22/2012   Procedure: LAPAROSCOPIC NEPHRECTOMY;  Surgeon: Dutch Gray, MD;  Location: [facility];  Service: Urology;  Laterality: Left;  . LEFT HEART  CATH AND CORS/GRAFTS ANGIOGRAPHY N/A 10/11/2016   Procedure: Left Heart Cath and Cors/Grafts Angiography;  Surgeon: Sherren Mocha, MD;  Location: [facility];  Service: Cardiovascular;  Laterality: N/A;  . LEFT HEART CATHETERIZATION WITH CORONARY ANGIOGRAM N/A 01/02/2015   Procedure: LEFT HEART CATHETERIZATION WITH CORONARY ANGIOGRAM;  Surgeon: Leonie Man, MD;  Location: [facility];  Service: Cardiovascular;  Laterality: N/A;  . NECK LESION BIOPSY Left 08/2016  . PORTA CATH INSERTION Right ~ 2017  . TONSILLECTOMY  1967  . VIDEO  BRONCHOSCOPY  10/11/2012   Procedure: VIDEO BRONCHOSCOPY WITH FLUORO;  Surgeon: Kathee Delton, MD;  Location: [facility];  Service: Cardiopulmonary;  Laterality: Bilateral;  . WIDS    . WISDOM TOOTH EXTRACTION     "all 4"    Current Outpatient Medications  Medication Sig Dispense Refill  . Acetaminophen (TYLENOL PO) Take 2 tablets by mouth every 6 (six) hours as needed (headache).    Marland Kitchen aspirin 81 MG EC tablet Take 1 tablet (81 mg total) by mouth daily.    Marland Kitchen atorvastatin (LIPITOR) 40 MG tablet TAKE 1 TABLET (40 MG TOTAL) BY MOUTH DAILY. 90 tablet 3  . Cholecalciferol (VITAMIN D PO) Take 1 tablet by mouth daily.    . clopidogrel (PLAVIX) 75 MG tablet Take 1 tablet (75 mg total) by mouth daily. 90 tablet 3  . cyclobenzaprine (FLEXERIL) 10 MG tablet Take 1 tablet (10 mg total) by mouth 3 (three) times daily as needed for muscle spasms. 30 tablet 1  . doxycycline (VIBRA-TABS) 100 MG tablet Take 1 tablet (100 mg total) by mouth 2 (two) times daily. 20 tablet 0  . famotidine (PEPCID) 20 MG tablet Take 1 tablet (20 mg total) by mouth daily. 30 tablet 2  . furosemide (LASIX) 40 MG tablet Take 1 tablet (40 mg total) by mouth daily. 90 tablet 3  . HYDROcodone-acetaminophen (NORCO/VICODIN) 5-325 MG tablet Take 1 tablet by mouth every 4 (four) hours as needed for moderate pain. 20 tablet 0  . isosorbide mononitrate (IMDUR) 60 MG 24 hr tablet Take 1 tablet (60 mg total) by mouth daily. 90 tablet 3  . levothyroxine (SYNTHROID, LEVOTHROID) 137 MCG tablet TAKE 1 TABLET (137 MCG TOTAL) BY MOUTH DAILY BEFORE BREAKFAST. 30 tablet 0  . lidocaine-prilocaine (EMLA) cream Apply 1 application topically See admin instructions. Apply cream to portacath site approximately 1 hour before access and cover  3  . loratadine (CLARITIN) 10 MG tablet Take 1 tablet (10 mg total) by mouth daily. 30 tablet 2  . metoprolol tartrate (LOPRESSOR) 50 MG tablet Take 1.5 tablets (75 mg total) by mouth 2 (two) times daily. 270 tablet 3    . nitroGLYCERIN (NITROSTAT) 0.4 MG SL tablet PLACE 1 TABLET (0.4 MG TOTAL) UNDER THE TONGUE EVERY 5 (FIVE) MINUTES AS NEEDED FOR CHEST PAIN. 25 tablet 5  . oxycodone (OXY-IR) 5 MG capsule Take 5 mg by mouth daily as needed (severe pain). pain     No current facility-administered medications for this visit.     Allergies:   Silver   Social History:  The patient  reports that she quit smoking about 5 years ago. Her smoking use included cigarettes. She has a 60.00 pack-year smoking history. She has never used smokeless tobacco. She reports that she drinks alcohol. She reports that she does not use drugs.   Family History:  The patient's family history includes Aortic dissection in her brother; Breast cancer in her maternal aunt; Cancer in her maternal grandfather;  Colon cancer in her maternal aunt; Heart attack in her father; Heart disease in her brother, father, maternal grandmother, and mother; Skin cancer in her maternal uncle; Stomach cancer in her maternal uncle; Stroke in her brother.    ROS:  Please see the history of present illness.  Otherwise, review of systems is positive for weight gain, depression, rash, balance problems.  All other systems are reviewed and negative.    PHYSICAL EXAM: VS:  BP 124/88   Pulse 75   Ht 5\' 8"  (1.727 m)   Wt 202 lb 12.8 oz (92 kg)   SpO2 99%   BMI 30.84 kg/m  , BMI Body mass index is 30.84 kg/m. GEN: Well nourished, well developed, in no acute distress  HEENT: normal  Neck: no JVD, no masses. No carotid bruits Cardiac: RRR without murmur or gallop                Respiratory:  clear to auscultation bilaterally, normal work of breathing GI: soft, nontender, nondistended, + BS MS: no deformity or atrophy  Ext: 1+ pretibial edema, pedal pulses 2+= bilaterally Skin: warm and dry, no rash Neuro:  Strength and sensation are intact Psych: euthymic mood, full affect  EKG:  EKG is not ordered today.  Recent Labs: 05/27/2017: ALT 31; B Natriuretic  Peptide 70.1; BUN 22; Creatinine, Ser 0.99; Potassium 3.5; Sodium 139; TSH 1.464 05/28/2017: Hemoglobin 14.6; Platelets 355   Lipid Panel     Component Value Date/Time   CHOL 90 01/03/2015 0210   TRIG 77 01/03/2015 0210   HDL 15 (L) 01/03/2015 0210   CHOLHDL 6.0 01/03/2015 0210   VLDL 15 01/03/2015 0210   LDLCALC 60 01/03/2015 0210     Wt Readings from Last 3 Encounters:  02/02/18 202 lb 12.8 oz (92 kg)  01/31/18 202 lb 12.8 oz (92 kg)  09/08/17 213 lb (96.6 kg)    Cardiac Studies Reviewed: Echo 05/27/2017: Study Conclusions  - Left ventricle: The cavity size was normal. Wall thickness was   normal. Systolic function was vigorous. The estimated ejection   fraction was in the range of 65% to 70%. Doppler parameters are   consistent with abnormal left ventricular relaxation (grade 1   diastolic dysfunction).  Cath 10/11/2016: Conclusion   1. Severe 2 vessel CAD with tight stenosis of a small left circumflex and total occlusion of the proximal RCA (large dominant vessel) 2. Nonobstructive left main and LAD stenosis 3. Normal LV function by noninvasive assessment 4. Heavily calcified coronary arteries  Plan:  Initial medical therapy (negative enzymes this admission)  If fails medical therapy, consider PCI of the proximal LCx and CTO-PCI with atherectomy of the RCA  OK to discharge home tomorrow if no complications arise  Indications   Unstable angina (HCC) [I20.0 (ICD-10-CM)]  Procedural Details/Technique   Technical Details INDICATION: Chest pain c/w unstable angina pectoris, abnormal nuclear stress test. 62 yo woman with severe CAD who underwent emergency CABG in 2016 when she presented with NSTEMI and was found to have complex and critical RCA stenosis. She was initially treated with PCI but developed coronary dissection and developed an inferior STEMI requiring emergency CABG. Treated with a SVG-PDA graft. Now presents with progressive anginal symptoms, negative cardiac  enzymes, and a nuclear scan showing inferior infarct and peri-infarct ischemia.   PROCEDURAL DETAILS:  The right groin was prepped, draped, and anesthetized with 1% lidocaine. Using modified Seldinger technique, a 5 French sheath was introduced into the right femoral artery. Standard Judkins catheters  were used for coronary angiography, SVG angiography, and a pigtail catheter was used to record LV pressure. Catheter exchanges were performed over an 0.035 guidewire. There were no immediate procedural complications. The patient was transferred to the post catheterization recovery area for further monitoring.    Estimated blood loss <50 mL.  During this procedure the patient was administered the following to achieve and maintain moderate conscious sedation: Versed 2 mg, Fentanyl 50 mcg, while the patient's heart rate, blood pressure, and oxygen saturation were continuously monitored. The period of conscious sedation was 27 minutes, of which I was present face-to-face 100% of this time.  Coronary Findings   Diagnostic  Dominance: Right  Left Anterior Descending  Prox LAD lesion 40% stenosed  The lesion is calcified.  Left Circumflex  Prox Cx lesion 90% stenosed  The lesion is concentric. unchanged from previous cath study in 2016  Right Coronary Artery  Prox RCA lesion 100% stenosed  with left-to-right collateral flow. The lesion is severely calcified. There is heavy calcification and short total occlusion of the RCA in the proximal vessel. The LAD collateralizes the RCA branches via septal collaterals.  saphenous Graft to RPDA  SVG graft was visualized by angiography. Total occlusion just beyond the aortic anastomosis.  Origin lesion 100% stenosed  Origin lesion.  Intervention   No interventions have been documented.  Coronary Diagrams   Diagnostic Diagram         ASSESSMENT AND PLAN: 1.  Coronary artery disease, native vessel, with angina: I reviewed the patient's cardiac  catheterization films with her.  We reviewed potential revascularization options which include stenting of the proximal circumflex and CTO intervention of the RCA.  I discussed the potential benefit as well as risks associated with this procedure.  She prefers to stick with medical therapy for now.  Will increase isosorbide to 60 mg daily.  She otherwise will remain on her current medical program which includes aspirin, clopidogrel, a high intensity statin drug, and a beta-blocker.  2.  Hypertension: Blood pressure is controlled on isosorbide and metoprolol.  3.  Hyperlipidemia: Will add lipids to her next blood draw.  She did not want to have her blood drawn here because she likes for her port to be used for access.  She is treated with atorvastatin 40 mg.  Lifestyle modification is discussed.  Current medicines are reviewed with the patient today.  The patient does not have concerns regarding medicines.  Labs/ tests ordered today include:   Orders Placed This Encounter  Procedures  . Lipid panel  . Hepatic function panel   Disposition:   FU 6 months  Signed, Sherren Mocha, MD  02/02/2018 1:07 PM    [facility] [address], [place], [place]  [address] Phone: [phone]; Fax: [phone]

## 2018-02-08 DIAGNOSIS — E1165 Type 2 diabetes mellitus with hyperglycemia: Secondary | ICD-10-CM | POA: Diagnosis not present

## 2018-02-08 DIAGNOSIS — C641 Malignant neoplasm of right kidney, except renal pelvis: Secondary | ICD-10-CM | POA: Diagnosis not present

## 2018-02-08 DIAGNOSIS — C642 Malignant neoplasm of left kidney, except renal pelvis: Secondary | ICD-10-CM | POA: Diagnosis not present

## 2018-02-08 DIAGNOSIS — E1159 Type 2 diabetes mellitus with other circulatory complications: Secondary | ICD-10-CM | POA: Insufficient documentation

## 2018-02-20 DIAGNOSIS — C642 Malignant neoplasm of left kidney, except renal pelvis: Secondary | ICD-10-CM | POA: Diagnosis not present

## 2018-02-20 DIAGNOSIS — R918 Other nonspecific abnormal finding of lung field: Secondary | ICD-10-CM | POA: Diagnosis not present

## 2018-02-20 DIAGNOSIS — C641 Malignant neoplasm of right kidney, except renal pelvis: Secondary | ICD-10-CM | POA: Diagnosis not present

## 2018-03-06 DIAGNOSIS — R52 Pain, unspecified: Secondary | ICD-10-CM | POA: Diagnosis not present

## 2018-03-06 DIAGNOSIS — Z79899 Other long term (current) drug therapy: Secondary | ICD-10-CM | POA: Diagnosis not present

## 2018-03-06 DIAGNOSIS — C642 Malignant neoplasm of left kidney, except renal pelvis: Secondary | ICD-10-CM | POA: Diagnosis not present

## 2018-03-07 ENCOUNTER — Emergency Department (HOSPITAL_COMMUNITY): Payer: 59

## 2018-03-07 ENCOUNTER — Encounter: Payer: Self-pay | Admitting: Family Medicine

## 2018-03-07 ENCOUNTER — Emergency Department (HOSPITAL_COMMUNITY)
Admission: EM | Admit: 2018-03-07 | Discharge: 2018-03-07 | Disposition: A | Discharge status: Home / Self Care | Payer: 59 | Attending: Emergency Medicine | Admitting: Emergency Medicine

## 2018-03-07 ENCOUNTER — Other Ambulatory Visit: Payer: Self-pay

## 2018-03-07 ENCOUNTER — Encounter (HOSPITAL_COMMUNITY): Payer: Self-pay | Admitting: *Deleted

## 2018-03-07 ENCOUNTER — Ambulatory Visit: Payer: 59 | Admitting: Family Medicine

## 2018-03-07 VITALS — BP 146/100 | HR 84 | Temp 97.9°F | Resp 18 | Ht 68.0 in | Wt 204.0 lb

## 2018-03-07 DIAGNOSIS — J069 Acute upper respiratory infection, unspecified: Secondary | ICD-10-CM | POA: Insufficient documentation

## 2018-03-07 DIAGNOSIS — J40 Bronchitis, not specified as acute or chronic: Secondary | ICD-10-CM

## 2018-03-07 DIAGNOSIS — C642 Malignant neoplasm of left kidney, except renal pelvis: Secondary | ICD-10-CM | POA: Insufficient documentation

## 2018-03-07 DIAGNOSIS — E018 Other iodine-deficiency related thyroid disorders and allied conditions: Secondary | ICD-10-CM | POA: Insufficient documentation

## 2018-03-07 DIAGNOSIS — I209 Angina pectoris, unspecified: Secondary | ICD-10-CM

## 2018-03-07 DIAGNOSIS — Z7982 Long term (current) use of aspirin: Secondary | ICD-10-CM | POA: Insufficient documentation

## 2018-03-07 DIAGNOSIS — J0140 Acute pansinusitis, unspecified: Secondary | ICD-10-CM | POA: Diagnosis not present

## 2018-03-07 DIAGNOSIS — Z79899 Other long term (current) drug therapy: Secondary | ICD-10-CM | POA: Insufficient documentation

## 2018-03-07 DIAGNOSIS — I252 Old myocardial infarction: Secondary | ICD-10-CM | POA: Insufficient documentation

## 2018-03-07 DIAGNOSIS — I208 Other forms of angina pectoris: Secondary | ICD-10-CM

## 2018-03-07 DIAGNOSIS — I110 Hypertensive heart disease with heart failure: Secondary | ICD-10-CM | POA: Insufficient documentation

## 2018-03-07 DIAGNOSIS — Z7901 Long term (current) use of anticoagulants: Secondary | ICD-10-CM | POA: Insufficient documentation

## 2018-03-07 DIAGNOSIS — R079 Chest pain, unspecified: Secondary | ICD-10-CM | POA: Diagnosis present

## 2018-03-07 DIAGNOSIS — Z87891 Personal history of nicotine dependence: Secondary | ICD-10-CM | POA: Insufficient documentation

## 2018-03-07 DIAGNOSIS — Z905 Acquired absence of kidney: Secondary | ICD-10-CM | POA: Insufficient documentation

## 2018-03-07 DIAGNOSIS — C649 Malignant neoplasm of unspecified kidney, except renal pelvis: Secondary | ICD-10-CM

## 2018-03-07 DIAGNOSIS — I5032 Chronic diastolic (congestive) heart failure: Secondary | ICD-10-CM | POA: Insufficient documentation

## 2018-03-07 HISTORY — DX: Other nonspecific abnormal finding of lung field: R91.8

## 2018-03-07 HISTORY — DX: Chronic diastolic (congestive) heart failure: I50.32

## 2018-03-07 HISTORY — DX: Other disorders of plasma-protein metabolism, not elsewhere classified: E88.09

## 2018-03-07 HISTORY — DX: Localized edema: R60.0

## 2018-03-07 LAB — BASIC METABOLIC PANEL
Anion gap: 11 (ref 5–15)
BUN: 14 mg/dL (ref 6–20)
CHLORIDE: 107 mmol/L (ref 98–111)
CO2: 24 mmol/L (ref 22–32)
CREATININE: 1.02 mg/dL — AB (ref 0.44–1.00)
Calcium: 9.4 mg/dL (ref 8.9–10.3)
GFR calc non Af Amer: 59 mL/min — ABNORMAL LOW (ref >60)
GLUCOSE: 181 mg/dL — AB (ref 70–99)
Potassium: 3.7 mmol/L (ref 3.5–5.1)
Sodium: 142 mmol/L (ref 135–145)

## 2018-03-07 LAB — I-STAT BETA HCG BLOOD, ED (MC, WL, AP ONLY)
HCG, QUANTITATIVE: 83.2 m[IU]/mL — AB (ref <5)
I-stat hCG, quantitative: 106.5 m[IU]/mL — ABNORMAL HIGH (ref <5)

## 2018-03-07 LAB — CBC
HCT: 45.1 % (ref 36.0–46.0)
HEMOGLOBIN: 14.7 g/dL (ref 12.0–15.0)
MCH: 29.2 pg (ref 26.0–34.0)
MCHC: 32.6 g/dL (ref 30.0–36.0)
MCV: 89.7 fL (ref 78.0–100.0)
PLATELETS: 343 10*3/uL (ref 150–400)
RBC: 5.03 MIL/uL (ref 3.87–5.11)
RDW: 16.1 % — ABNORMAL HIGH (ref 11.5–15.5)
WBC: 14.9 10*3/uL — ABNORMAL HIGH (ref 4.0–10.5)

## 2018-03-07 LAB — I-STAT TROPONIN, ED
Troponin i, poc: 0 ng/mL (ref 0.00–0.08)
Troponin i, poc: 0 ng/mL (ref 0.00–0.08)

## 2018-03-07 MED ORDER — BENZONATATE 100 MG PO CAPS
100.0000 mg | ORAL_CAPSULE | Freq: Three times a day (TID) | ORAL | 0 refills | Status: DC | PRN
Start: 2018-03-07 — End: 2018-12-24

## 2018-03-07 MED ORDER — IPRATROPIUM-ALBUTEROL 0.5-2.5 (3) MG/3ML IN SOLN
3.0000 mL | Freq: Once | RESPIRATORY_TRACT | Status: AC
  Administered 2018-03-07: 3 mL via RESPIRATORY_TRACT

## 2018-03-07 MED ORDER — AMOXICILLIN-POT CLAVULANATE 875-125 MG PO TABS: 1.0000 | ORAL_TABLET | Freq: Two times a day (BID) | ORAL | 0 refills | Status: DC

## 2018-03-07 MED ORDER — ALBUTEROL SULFATE HFA 108 (90 BASE) MCG/ACT IN AERS: 2.0000 | INHALATION_SPRAY | RESPIRATORY_TRACT | 0 refills | Status: DC | PRN

## 2018-03-07 MED ORDER — ASPIRIN 81 MG PO CHEW
324.0000 mg | CHEWABLE_TABLET | Freq: Once | ORAL | Status: AC
  Administered 2018-03-07: 324 mg via ORAL
  Filled 2018-03-07: qty 4

## 2018-03-07 MED ORDER — PREDNISONE 20 MG PO TABS: ORAL_TABLET | ORAL | 0 refills | Status: DC

## 2018-03-07 NOTE — ED Triage Notes (Signed)
Pt states she took a nitro tablet prior to arrival which improved pain

## 2018-03-07 NOTE — Consult Note (Addendum)
The patient has been seen in conjunction with Sheri Copa, PA-C. All aspects of care have been considered and discussed. The patient has been personally interviewed, examined, and all clinical data has been reviewed.   Recent upper respiratory and sinus congestion related to lead to visit primary in care office earlier this morning where she received a nebulizer treatment.  Likely received a sympathomimetic.  Shortly thereafter, she developed sensation that her heart was racing and she could feel it beating all over her body.  She became very anxious.  Within 10 minutes she began having angina.  She did take nitroglycerin sublingually and in less than 15 minutes the chest discomfort resolved.  She still felt as though her heart was pounding.  Initial point-of-care troponin was unremarkable and EKG did not reveal evidence of ischemia or change from baseline.  Anginal episode is likely related to sympathomimetic bronchodilator therapy causing increase in heart rate.  No evidence of ischemia on EKG or infarction by initial troponin.  She currently feels well, is pain-free, and had less than 15 minutes of chest pain in total.  Repeat troponin.  If delta TI is unremarkable, she can be discharged from the emergency room.  Notify us if recurrent chest pain or abnormal T.  Cardiology Consultation:   Patient ID: Sheri Hernandez; [id number]; [birth year]   Admit date: 03/07/2018 Date of Consult: 03/07/2018  Primary Care Provider: Susy Frizzle, MD Primary Cardiologist: Sheri Mocha, MD  Chief Complaint: whole body shaking including right arm and chest pain  Patient Profile:   Sheri Hernandez is a 62 y.o. female with a hx of metastatic renal cell carcinoma s/p L nephrectomy (on chemotherapy) with pulmonary nodules, CAD/coronary dissection as outlined below, anxiety, HTN, hypothyroidism, peripheral edema (not taking Lasix due to urinary complaints) with low albumin, possible chronic diastolic CHF,  probable CKD II who is being seen today for the evaluation of chest pain at the request of Sheri Hernandez.  History of Present Illness:   The patient has a history of non-STEMI in 2016 when she underwent complex PCI of the right coronary artery complicated by extensive dissection and an acute inferior STEMI. She required emergency bypass surgery with a saphenous vein graft to right PDA/PLA.She developed progressive angina about 1 year ago and cardiac catheterization 10/2016 demonstrated total occlusion of her bypass graft with subtotal occlusion of the native right coronary artery in an area of severe calcification. Medical therapy was recommended. The cath report did note if fails medical therapy, consider PCI of the proximal LCx and CTO-PCI with atherectomy of the RCA. She was seen by Dr. Burt Knack 02/02/18 with intermittent chest tightness related to increased stress. Imdur was titrated with follow-up planned in 6 months. Last echo 05/2017 showed EF 65-70%, grade 1 DD.  Regarding her RCC, "She was started on afinitior but had evidence of progression as well. Switched to axitinib 11/05/13 with dose adjustment with evidence of response on subsequent imaging. The disease remained stable but progressed 07/16/14 CT scan and was started on nivolumab on 08/07/14 with subsequent pseudoprogression and then response. She received subsequent courses with long-term stable disease and later substantial improvement."  About 2 weeks ago her son was sick with a URI. About a week ago she began to see symptoms with sore throat and sneezing. She subsequently developed persistent nasal congestion, some hoarseness and a sensation of chest congestion so went to PCP today. She received an in-office breathing treatment and a prescription for antibiotics. After this appointment she  was sitting down to eat lunch with her head propped up against her right arm and she began feeling like her right arm was spontaneously shaking. It really  alarmed her and then she began to notice her whole body felt like it was shaking and heart was pounding. They left and got in the car to go to the grocery store and she began feeling chest discomfort over her left upper chest radiating to her arms with numbness in her hands. She becomes emotional relaying the story at this point because the symptoms remind her of what she had prior to MI in 2016. She took 2 SL NTG and symptoms eased off gradually. They stopped by a volunteer fire dept but there was no one there so they came up to the ER. By the time she got here she felt much better without any chest pain or palpitations. She states she did over 2 mile hike this past week at [facility] and it was much more activity than she's used to. She had a lot of shortness of breath but no chest pain with this. She feels her legs are more swollen than before and are somewhat tender to the touch. She hasn't been taking Lasix because the frequent urination interferes with her daily living. She drinks a lot of fluids because she stays thirsty.  Labs reveal leukocytosis of 14.9K, Cr 1.02, elevated glucose of 182, negative troponin, and abnormal beta HCG of 106 then 83. CXR with chronic interstitial disease at the left lung base, chronic cardiomegaly, no acute abnormalities. HR is 80s-90s, pulse ox 95-97% RA, SBP 130s-150s. She received ASA 324mg  and remains comfortable but anxious about today's events. Total duration of sx was about 20 mins.  Past Medical History:  Diagnosis Date  . Anxiety   . Arthritis    "maybe in my right hand" (05/26/2017)  . CAD (coronary artery disease)    a. 12/2014 Cath: LM nl, LAD 20p, 51m, D2 20ost, LCX 95p, 41m, RCA dominant, 95p, 73m/d, RPDA 95, RPL2 50, attempted RCA PCI w/ acute RCA occlusion-->CABG x 2 VG->RPDA->PLVB.  Marland Kitchen CHF (congestive heart failure) ([place])   . Hypertension   . Hypothyroidism   . Lung mass    LUNG MASSES--COUGH-BIOPSY NON-DIAGNOSTIC.  HX OF  MEDIASTINAL MASS AND LUNG MASSES AGE 60 -NEGATIVE BIOSPY-BUT GIVEN DIAGNOSIS OF SARCOIDOSIS.  WORK UP CONTINUING ON DIAGNOSIS FOR PT'S LUNG MASSES--SHE HAS BIOPSY PROVEN KIDNEY CANCER  . Myocardial infarction ([place]) 12/2014 X 2  . 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  . Papilloma of breast 2012   left. lumpectomy  . Renal cancer ([place])    "stage IV" (05/26/2017)  . Renal cell cancer [facility])    metastatic renal cell carcinoma (biopsy of left renal mass 10/2012) On nivolumab at [facility]  . S/p nephrectomy    left, due to metastatic RCC, 3/14    Past Surgical History:  Procedure Laterality Date  . BREAST BIOPSY  08/18/2011   Procedure: BREAST BIOPSY WITH NEEDLE LOCALIZATION;  Surgeon: Merrie Roof, MD;  Location: [place];  Service: General;  Laterality: Left;  left breast biopsy with needle localization  . BRONCHIAL BRUSH BIOPSY  2014  . CARDIAC CATHETERIZATION    . CORONARY ARTERY BYPASS GRAFT N/A 01/02/2015  Procedure: CORONARY ARTERY BYPASS GRAFTING (CABG)TIMES TWO USING RIGHT GREATER SAPHENOUS LEG VEIN HARVESTED ENDOSCOPICALLY;  Surgeon: [name], MD;  Location: [place];  Service: Open Heart Surgery;  Laterality: N/A;  . LAPAROSCOPIC NEPHRECTOMY Left 11/22/2012   Procedure: LAPAROSCOPIC NEPHRECTOMY;  Surgeon: Dutch Gray, MD;  Location: [facility];  Service: Urology;  Laterality: Left;  . LEFT HEART CATH AND CORS/GRAFTS ANGIOGRAPHY N/A 10/11/2016   Procedure: Left Heart Cath and Cors/Grafts Angiography;  Surgeon: Sheri Mocha, MD;  Location: [place] CV LAB;  Service: Cardiovascular;  Laterality: N/A;  . LEFT HEART CATHETERIZATION WITH CORONARY ANGIOGRAM N/A 01/02/2015   Procedure: LEFT HEART CATHETERIZATION WITH CORONARY ANGIOGRAM;  Surgeon: Leonie Man, MD;  Location[facility] C[facility];   Service: Cardiovascular;  Laterality: N/A;  . NECK LESION BIOPSY Left 08/2016  . PORTA CATH INSERTION Right ~ 2017  . TONSILLECTOMY  1967  . VIDEO BRONCHOSCOPY  10/11/2012   Procedure: VIDEO BRONCHOSCOPY WITH FLUORO;  Surgeon: Kathee Delton, MD;  Location: [facility];  Service: Cardiopulmonary;  Laterality: Bilateral;  . WIDS    . WISDOM TOOTH EXTRACTION     "all 4"     Inpatient Medications: Scheduled Meds:  Continuous Infusions:  PRN Meds:   Home Meds: Prior to Admission medications   Medication Sig Start Date End Date Taking? Authorizing Provider  Acetaminophen (TYLENOL PO) Take 2 tablets by mouth every 6 (six) hours as needed (headache).   Yes [provider]  aspirin 81 MG EC tablet Take 1 tablet (81 mg total) by mouth daily. 02/09/16  Yes Sheri Mocha, MD  atorvastatin (LIPITOR) 40 MG tablet TAKE 1 TABLET (40 MG TOTAL) BY MOUTH DAILY. 04/18/17  Yes Sheri Mocha, MD  Cholecalciferol (VITAMIN D PO) Take 1 tablet by mouth daily.   Yes [provider]  clopidogrel (PLAVIX) 75 MG tablet Take 1 tablet (75 mg total) by mouth daily. 10/10/17  Yes Sheri Mocha, MD  famotidine (PEPCID) 20 MG tablet Take 1 tablet (20 mg total) by mouth daily. 05/29/17  Yes Theora Gianotti, NP  furosemide (LASIX) 40 MG tablet Take 1 tablet (40 mg total) by mouth daily. 09/08/17 09/03/18 Yes Sheri Mocha, MD  HYDROcodone-acetaminophen (NORCO/VICODIN) 5-325 MG tablet Take 1 tablet by mouth every 4 (four) hours as needed for moderate pain. 07/06/16  Yes Orlena Sheldon, PA-C  isosorbide mononitrate (IMDUR) 60 MG 24 hr tablet Take 1 tablet (60 mg total) by mouth daily. 02/02/18  Yes Sheri Mocha, MD  levothyroxine (SYNTHROID, LEVOTHROID) 137 MCG tablet TAKE 1 TABLET (137 MCG TOTAL) BY MOUTH DAILY BEFORE BREAKFAST. 01/10/18  Yes Sheri Frizzle, MD  lidocaine-prilocaine (EMLA) cream Apply 1 application topically See admin instructions. Apply cream to portacath site approximately 1  hour before access and cover 09/24/16  Yes [provider]  loratadine (CLARITIN) 10 MG tablet Take 1 tablet (10 mg total) by mouth daily. 05/29/17  Yes Theora Gianotti, NP  metoprolol tartrate (LOPRESSOR) 50 MG tablet Take 1.5 tablets (75 mg total) by mouth 2 (two) times daily. 09/08/17 09/03/18 Yes Sheri Mocha, MD  nitroGLYCERIN (NITROSTAT) 0.4 MG SL tablet PLACE 1 TABLET (0.4 MG TOTAL) UNDER THE TONGUE EVERY 5 (FIVE) MINUTES AS NEEDED FOR CHEST PAIN. 10/30/17  Yes Sheri Mocha, MD  oxycodone (OXY-IR) 5 MG capsule Take 5 mg by mouth daily as needed (severe pain). pain 08/30/16  Yes [provider]  albuterol (PROVENTIL HFA;VENTOLIN HFA) 108 (90 Base) MCG/ACT inhaler Inhale 2 puffs into the lungs every 4 (  four) hours as needed for wheezing or shortness of breath. 03/07/18   Delsa Grana, PA-C  amoxicillin-clavulanate (AUGMENTIN) 875-125 MG tablet Take 1 tablet by mouth 2 (two) times daily. 03/07/18   Delsa Grana, PA-C  benzonatate (TESSALON) 100 MG capsule Take 1 capsule (100 mg total) by mouth 3 (three) times daily as needed for cough. 03/07/18   Delsa Grana, PA-C  cyclobenzaprine (FLEXERIL) 10 MG tablet Take 1 tablet (10 mg total) by mouth 3 (three) times daily as needed for muscle spasms. Patient not taking: Reported on 03/07/2018 07/06/16   Dena Billet B, PA-C  predniSONE (DELTASONE) 20 MG tablet 2 tabs poqday 1-3, 1 tabs poqday 4-6 Patient not taking: Reported on 03/07/2018 03/07/18   Delsa Grana, PA-C    Allergies:    Allergies  Allergen Reactions  . Silver Dermatitis    Please use mepalex    Social History:   Social History   Socioeconomic History  . Marital status: Married    Spouse name: Not on file  . Number of children: 0  . Years of education: Not on file  . Highest education level: Not on file  Occupational History  . 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   Social Needs  . Financial resource strain: Not on file  . Food  insecurity:    Worry: Not on file    Inability: Not on file  . Transportation needs:    Medical: Not on file    Non-medical: Not on file  Tobacco Use  . Smoking status: Former Smoker    Packs/day: 1.50    Years: 40.00    Pack years: 60.00    Types: Cigarettes    Last attempt to quit: 09/24/2012    Years since quitting: 5.4  . Smokeless tobacco: Never Used  Substance and Sexual Activity  . Alcohol use: Yes    Comment: 05/26/2017 "couple drinks/year"  . Drug use: No  . Sexual activity: Yes  Lifestyle  . Physical activity:    Days per week: Not on file    Minutes per session: Not on file  . Stress: Not on file  Relationships  . Social connections:    Talks on phone: Not on file    Gets together: Not on file    Attends religious service: Not on file    Active member of club or organization: Not on file    Attends meetings of clubs or organizations: Not on file    Relationship status: Not on file  . Intimate partner violence:    Fear of current or ex partner: Not on file    Emotionally abused: Not on file    Physically abused: Not on file    Forced sexual activity: Not on file  Other Topics Concern  . Not on file  Social History Narrative  . Not on file    Family History:   The patient's family history includes Aortic dissection in her brother; Breast cancer in her maternal aunt; Cancer in her maternal grandfather; Colon cancer in her maternal aunt; Heart attack in her father; Heart disease in her brother, father, maternal grandmother, and mother; Skin cancer in her maternal uncle; Stomach cancer in her maternal uncle; Stroke in her brother.  ROS:  Please see the history of present illness. All other ROS reviewed and negative.     Physical Exam/Data:   Vitals:   03/07/18 1217 03/07/18 1245 03/07/18 1400 03/07/18 1637  BP: 136/85 137/90 (!) 156/80 (!) 182/83  Pulse: 99 96 84 80  Resp: 20 19 18 18   Temp: 98 F (36.7 C)     TempSrc: Oral     SpO2: 95% 97% 95% 99%    No intake or output data in the 24 hours ending 03/07/18 1645 There were no vitals filed for this visit. There is no height or weight on file to calculate BMI.  General: Well developed, well nourished WF, in no acute distress, anxious at times Head: Normocephalic, atraumatic, sclera non-icteric, no xanthomas, nares are without discharge. Neck: Negative for carotid bruits. JVD not elevated. Lungs: Clear bilaterally to auscultation without wheezes, rales, or rhonchi. Breathing is unlabored. Heart: RRR with S1 S2. No murmurs, rubs, or gallops appreciated. Abdomen: Soft, non-tender, non-distended with normoactive bowel sounds. No hepatomegaly. No rebound/guarding. No obvious abdominal masses. Msk:  Strength and tone appear normal for age. Extremities: No clubbing or cyanosis. 1+ bilateral LE edema primarily of ankles to knees, not really in feet - unable to tell if pitting as even light touch made the patient jump. Distal pedal pulses are 2+ and equal bilaterally. Neuro: Alert and oriented X 3. No facial asymmetry. No focal deficit. Moves all extremities spontaneously. Psych:  Responds to questions appropriately with a normal affect.  EKG:  The EKG was personally reviewed and demonstrates NSR 97bpm nonspecific ST-T changes   Relevant CV Studies: As above  Laboratory Data:  Chemistry Recent Labs  Lab 03/07/18 1229  NA 142  K 3.7  CL 107  CO2 24  GLUCOSE 181*  BUN 14  CREATININE 1.02*  CALCIUM 9.4  GFRNONAA 59*  GFRAA >60  ANIONGAP 11    No results for input(s): PROT, ALBUMIN, AST, ALT, ALKPHOS, BILITOT in the last 168 hours. Hematology Recent Labs  Lab 03/07/18 1229  WBC 14.9*  RBC 5.03  HGB 14.7  HCT 45.1  MCV 89.7  MCH 29.2  MCHC 32.6  RDW 16.1*  PLT 343   Cardiac EnzymesNo results for input(s): TROPONINI in the last 168 hours.  Recent Labs  Lab 03/07/18 1353  TROPIPOC 0.00    BNPNo results for input(s): BNP, PROBNP in the last 168 hours.  DDimer No  results for input(s): DDIMER in the last 168 hours.  Radiology/Studies:  Dg Chest 2 View  Result Date: 03/07/2018 CLINICAL DATA:  Chest pain radiating into the arms and neck. Dizziness. EXAM: CHEST - 2 VIEW COMPARISON:  05/26/2017 FINDINGS: Chronic cardiomegaly. Pulmonary vascularity is normal. Power port in place with the tip at the cavoatrial junction, unchanged. CABG. Chronic situation of the interstitial markings at the left base laterally. No effusions. No acute bone abnormality. IMPRESSION: No acute abnormalities. Chronic cardiomegaly. Chronic interstitial disease at the left lung base. Electronically Signed   By: Lorriane Shire M.D.   On: 03/07/2018 14:19    Assessment and Plan:   1. Body shaking associated with heart pounding/ chest pain in the setting of known CAD as above - I am not sure what to make of the initial event with her right arm shaking evolving into whole body shaking. It does not sound like there were any other specific neurologic symptoms. The description of her symptoms makes me wonder if perhaps she had a transient tachycardia causing her to perceive her body as bouncing. (If so, the neb could have been an adrenergic trigger.) She is in NSR on telemetry here. She also seems rather anxious and the albuterol may have escalated this sensation. Troponin is negative and EKG shows nonspecific ST-T changes. I  will review further management with MD.  2. Persistent upper respiratory tract infection symptoms going on 1 week with leukocytosis - will defer to ER/IM to handle. PCP felt antibiotics were indicated.  3. Abnormal HCG - will need further workup by medical team. She is post menopausal. Question related to malignancy.  4. Lower extremity edema - patient reports increased swelling from usual but has not been compliant with Lasix. Was diuresed in 05/2017 with IV Lasix with 6lb weight loss. Echo with only mild diastolic dysfunction. Albumin has been low at 2.6 so I suspect this is  contributing. The patient is going to try taking it in the evening instead. She's had a lower extremity duplex in 05/2017 negative for DVT.  5. Renal insufficiency, suspect CKD II in setting of solitary kidney - prior value 0.99, 1.02 today so stable.  For questions or updates, please contact [place] Please consult [email or web address] for contact info under Cardiology/STEMI.    Signed, Charlie Pitter, PA-C  03/07/2018 4:45 PM

## 2018-03-07 NOTE — ED Notes (Signed)
Patient transported to X-ray 

## 2018-03-07 NOTE — Progress Notes (Signed)
I have sent a message to our office's scheduling team requesting a follow-up appointment, and our office will call the patient with this information.  Dayna Dunn PA-C

## 2018-03-07 NOTE — ED Notes (Signed)
Cardiology at bedside.

## 2018-03-07 NOTE — ED Provider Notes (Signed)
[facility] EMERGENCY DEPARTMENT Provider Note   CSN: [id number] Arrival date & time: 03/07/18  1213     History   Chief Complaint Chief Complaint  Patient presents with  . Chest Pain  . Dizziness    HPI Sheri Hernandez is a 62 y.o. female.  HPI: Pt has a medical history of stage 4 renal cell carcinoma (currently on nivolumab therapy), CAD with NSTEMI in 2016 s/p CABGx2, CHF, and DMII who presents to the ED with acute onset left-sided chest pain that began shortly prior to arrival. Earlier today Sheri Hernandez saw a family medicine provider for sore throat, nasal congestion, and sneezing for which she received an in-office breathing treatment and a prescription for antibiotics. After this appointment, she was sitting down to lunch when she felt that her whole body was shaking. She felt panicked by the body shaking, and soon began to feel chest pain in the left side of her chest that radiated into the bilateral neck and bilateral upper extremities all the way to the fingertips. She felt sweaty, but denies nausea. She took a SL nitro at this time and her chest pain was relieved within 15 minutes. She reports 1-2 episodes of anginal chest pain per week that occurs with exertion. She usually does not take nitro for these episodes as the pain resolves with rest. She reports that today's chest pain was similar to her normal anginal pain, but more severe in quality. She denies dyspnea, but reports increased lower extremity leg swelling and baseline orthopnea. She has not been taking her lasix as prescribed recently because of the inconvenience of using the bathroom so often.   Past Medical History:  Diagnosis Date  . Anxiety   . Arthritis    "maybe in my right hand" (05/26/2017)  . CAD (coronary artery disease)    a. 12/2014 Cath: LM nl, LAD 20p, 68m, D2 20ost, LCX 95p, 23m, RCA dominant, 95p, 79m/d, RPDA 95, RPL2 50, attempted RCA PCI w/ acute RCA occlusion-->CABG x 2 VG->RPDA->PLVB.   Marland Kitchen CHF (congestive heart failure) ([place])   . Hypertension   . Hypothyroidism   . Lung mass    LUNG MASSES--COUGH-BIOPSY NON-DIAGNOSTIC.  HX OF MEDIASTINAL MASS AND LUNG MASSES AGE 26 -NEGATIVE BIOSPY-BUT GIVEN DIAGNOSIS OF SARCOIDOSIS.  WORK UP CONTINUING ON DIAGNOSIS FOR PT'S LUNG MASSES--SHE HAS BIOPSY PROVEN KIDNEY CANCER  . Myocardial infarction ([place]) 12/2014 X 2  . 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  . Papilloma of breast 2012   left. lumpectomy  . Renal cancer ([place])    "stage IV" (05/26/2017)  . Renal cell cancer [facility])    metastatic renal cell carcinoma (biopsy of left renal mass 10/2012) On nivolumab at [facility]  . S/p nephrectomy    left, due to metastatic RCC, 3/14    Patient Active Problem List   Diagnosis Date Noted  . Chronic diastolic heart failure ([place]) 06/16/2017  . Hyperlipidemia 12/14/2016  . CAD (coronary artery disease) 10/12/2016  . Anxiety 10/10/2016  . Near syncope   . Chest pain 10/08/2016  . Essential hypertension 06/01/2016  . GERD (gastroesophageal reflux disease) 03/18/2015  . Acute blood loss anemia 01/12/2015  . Hyperglycemia 01/12/2015  . History of non-ST elevation myocardial infarction (NSTEMI)   .  S/P CABG x 2 01/03/2015  . Drug-induced hypothyroidism 03/13/2014  . S/p nephrectomy   . Adenocarcinoma, renal cell ([place]) 11/12/2012  . Lung mass 10/01/2012  . Renal mass 10/01/2012  . Mass of left breast 08/01/2011    Past Surgical History:  Procedure Laterality Date  . BREAST BIOPSY  08/18/2011   Procedure: BREAST BIOPSY WITH NEEDLE LOCALIZATION;  Surgeon: Merrie Roof, MD;  Location: [facility];  Service: General;  Laterality: Left;  left breast biopsy with needle localization  . BRONCHIAL BRUSH BIOPSY  2014  . CARDIAC CATHETERIZATION    .  CORONARY ARTERY BYPASS GRAFT N/A 01/02/2015   Procedure: CORONARY ARTERY BYPASS GRAFTING (CABG)TIMES TWO USING RIGHT GREATER SAPHENOUS LEG VEIN HARVESTED ENDOSCOPICALLY;  Surgeon: [name], MD;  Location: [facility];  Service: Open Heart Surgery;  Laterality: N/A;  . LAPAROSCOPIC NEPHRECTOMY Left 11/22/2012   Procedure: LAPAROSCOPIC NEPHRECTOMY;  Surgeon: Dutch Gray, MD;  Location: [facility];  Service: Urology;  Laterality: Left;  . LEFT HEART CATH AND CORS/GRAFTS ANGIOGRAPHY N/A 10/11/2016   Procedure: Left Heart Cath and Cors/Grafts Angiography;  Surgeon: Sherren Mocha, MD;  Location: [facility];  Service: Cardiovascular;  Laterality: N/A;  . LEFT HEART CATHETERIZATION WITH CORONARY ANGIOGRAM N/A 01/02/2015   Procedure: LEFT HEART CATHETERIZATION WITH CORONARY ANGIOGRAM;  Surgeon: Leonie Man, MD;  Location: [facility] CATH LAB;  Service: Cardiovascular;  Laterality: N/A;  . NECK LESION BIOPSY Left 08/2016  . PORTA CATH INSERTION Right ~ 2017  . TONSILLECTOMY  1967  . VIDEO BRONCHOSCOPY  10/11/2012   Procedure: VIDEO BRONCHOSCOPY WITH FLUORO;  Surgeon: Kathee Delton, MD;  Location: [facility];  Service: Cardiopulmonary;  Laterality: Bilateral;  . WIDS    . WISDOM TOOTH EXTRACTION     "all 4"     OB History    Gravida  0   Para  0   Term  0   Preterm  0   AB  0   Living  0     SAB  0   TAB  0   Ectopic  0   Multiple  0   Live Births               Home Medications    Prior to Admission medications   Medication Sig Start Date End Date Taking? Authorizing Provider  Acetaminophen (TYLENOL PO) Take 2 tablets by mouth every 6 (six) hours as needed (headache).    [provider]  albuterol (PROVENTIL HFA;VENTOLIN HFA) 108 (90 Base) MCG/ACT inhaler Inhale 2 puffs into the lungs every 4 (four) hours as needed for wheezing or shortness of breath. 03/07/18   Delsa Grana, PA-C  amoxicillin-clavulanate (AUGMENTIN) 875-125 MG tablet Take 1 tablet by mouth 2 (two)  times daily. 03/07/18   Delsa Grana, PA-C  aspirin 81 MG EC tablet Take 1 tablet (81 mg total) by mouth daily. 02/09/16   Sherren Mocha, MD  atorvastatin (LIPITOR) 40 MG tablet TAKE 1 TABLET (40 MG TOTAL) BY MOUTH DAILY. 04/18/17   Sherren Mocha, MD  benzonatate (TESSALON) 100 MG capsule Take 1 capsule (100 mg total) by mouth 3 (three) times daily as needed for cough. 03/07/18   Delsa Grana, PA-C  Cholecalciferol (VITAMIN D PO) Take 1 tablet by mouth daily.    [provider]  clopidogrel (PLAVIX) 75 MG tablet Take 1 tablet (75 mg total) by mouth daily. 10/10/17   Sherren Mocha, MD  cyclobenzaprine (FLEXERIL) 10 MG tablet Take 1 tablet (10  mg total) by mouth 3 (three) times daily as needed for muscle spasms. 07/06/16   Orlena Sheldon, PA-C  famotidine (PEPCID) 20 MG tablet Take 1 tablet (20 mg total) by mouth daily. 05/29/17   Theora Gianotti, NP  furosemide (LASIX) 40 MG tablet Take 1 tablet (40 mg total) by mouth daily. 09/08/17 09/03/18  Sherren Mocha, MD  HYDROcodone-acetaminophen (NORCO/VICODIN) 5-325 MG tablet Take 1 tablet by mouth every 4 (four) hours as needed for moderate pain. 07/06/16   Orlena Sheldon, PA-C  isosorbide mononitrate (IMDUR) 60 MG 24 hr tablet Take 1 tablet (60 mg total) by mouth daily. 02/02/18   Sherren Mocha, MD  levothyroxine (SYNTHROID, LEVOTHROID) 137 MCG tablet TAKE 1 TABLET (137 MCG TOTAL) BY MOUTH DAILY BEFORE BREAKFAST. 01/10/18   Susy Frizzle, MD  lidocaine-prilocaine (EMLA) cream Apply 1 application topically See admin instructions. Apply cream to portacath site approximately 1 hour before access and cover 09/24/16   [provider]  loratadine (CLARITIN) 10 MG tablet Take 1 tablet (10 mg total) by mouth daily. 05/29/17   Theora Gianotti, NP  metoprolol tartrate (LOPRESSOR) 50 MG tablet Take 1.5 tablets (75 mg total) by mouth 2 (two) times daily. 09/08/17 09/03/18  Sherren Mocha, MD  nitroGLYCERIN (NITROSTAT) 0.4 MG SL tablet  PLACE 1 TABLET (0.4 MG TOTAL) UNDER THE TONGUE EVERY 5 (FIVE) MINUTES AS NEEDED FOR CHEST PAIN. 10/30/17   Sherren Mocha, MD  oxycodone (OXY-IR) 5 MG capsule Take 5 mg by mouth daily as needed (severe pain). pain 08/30/16   [provider]  predniSONE (DELTASONE) 20 MG tablet 2 tabs poqday 1-3, 1 tabs poqday 4-6 03/07/18   Delsa Grana, PA-C    Family History Family History  Problem Relation Age of Onset  . Heart disease Mother   . Heart disease Father   . Heart attack Father   . Cancer Maternal Grandfather   . Heart disease Brother   . Stroke Brother   . Aortic dissection Brother   . Colon cancer Maternal Aunt   . Skin cancer Maternal Uncle   . Stomach cancer Maternal Uncle   . Heart disease Maternal Grandmother   . Breast cancer Maternal Aunt     Social History Social History   Tobacco Use  . Smoking status: Former Smoker    Packs/day: 1.50    Years: 40.00    Pack years: 60.00    Types: Cigarettes    Last attempt to quit: 09/24/2012    Years since quitting: 5.4  . Smokeless tobacco: Never Used  Substance Use Topics  . Alcohol use: Yes    Comment: 05/26/2017 "couple drinks/year"  . Drug use: No     Allergies   Silver   Review of Systems Review of Systems  Constitutional: Negative for chills and fever.  HENT: Positive for congestion, rhinorrhea, sneezing, sore throat and trouble swallowing.   Respiratory: Positive for cough (mild). Negative for shortness of breath.   Cardiovascular: Positive for chest pain and leg swelling.  Gastrointestinal: Negative for abdominal pain, nausea and vomiting.  Skin: Negative for rash and wound.  Neurological: Negative for dizziness and light-headedness.     Physical Exam Updated Vital Signs BP 137/90   Pulse 96   Temp 98 F (36.7 C) (Oral)   Resp 19   SpO2 97%   Physical Exam  Constitutional: She is oriented to person, place, and time. She appears well-developed and well-nourished. No distress.  HENT:  Head:  Normocephalic and atraumatic.  Mouth/Throat: Oropharynx is clear and moist. No oropharyngeal exudate or posterior oropharyngeal erythema.  No cervical lymphadenopathy.  Cardiovascular: Normal rate and regular rhythm. Exam reveals no gallop and no friction rub.  No murmur heard. Pulmonary/Chest: Effort normal and breath sounds normal. No respiratory distress.  Abdominal: Soft. She exhibits no distension. There is no tenderness. There is no guarding.  Abdominal hernia, reducible.  Musculoskeletal:  2+ pitting edema in bilateral lower extremities  Neurological: She is alert and oriented to person, place, and time.  Skin: Skin is warm and dry. No rash noted.  Psychiatric: Her behavior is normal. Her mood appears anxious.     ED Treatments / Results  Labs (all labs ordered are listed, but only abnormal results are displayed) Labs Reviewed  BASIC METABOLIC PANEL - Abnormal; Notable for the following components:      Result Value   Glucose, Bld 181 (*)    Creatinine, Ser 1.02 (*)    GFR calc non Af Amer 59 (*)    All other components within normal limits  CBC - Abnormal; Notable for the following components:   WBC 14.9 (*)    RDW 16.1 (*)    All other components within normal limits  I-STAT BETA HCG BLOOD, ED ([facility], [facility], [facility] ONLY) - Abnormal; Notable for the following components:   I-stat hCG, quantitative 106.5 (*)    All other components within normal limits  I-STAT TROPONIN, ED    EKG None  Radiology No results found.  Procedures Procedures (including critical care time)  Medications Ordered in ED Medications - No data to display   Initial Impression / Assessment and Plan / ED Course  I have reviewed the triage vital signs and the nursing notes.  Pertinent labs & imaging results that were available during my care of the patient were reviewed by me and considered in my medical decision making (see chart for details).  Sheri Hernandez is a 62 yo woman with a medical history of  stage 4 renal cell carcinoma (currently on nivolumab therapy), CAD with NSTEMI in 2016 s/p CABGx2, CHF, and DMII who presented to the ED with radiating left-sided chest pain that was relieved by sublingual nitroglycerin and associated with diaphoresis. This episode began after she felt anxious and her whole body was shaking. This shaking and anxiety is likely the result of the breathing treatment she received earlier in the day. The chest pain sounds like her typical anginal pain that occurs during times of stress or exertion and resolves with rest or nitroglycerin. Upon arrival to the ED, patient was pain free. She was afebrile with BP 146/100, HR 84, RR 18, and 97% O2 sat. Exam significant for 2+ pitting edema in the bilateral lower extremities. Labs significant for leukocytosis to 14.9 (likely due to concurrent viral URI). Troponin negative x2. EKG without significant changes from prior. CXR unremarkable. b-hcg positive x2, however given that patient is postmenopausal and has a malignancy (known to cause false positive pregnancy tests), this is likely a false positive. Cardiology was consulted based on her prior cardiac history and they agree that this is most likely stable angina and that the patient is safe for discharge home. Discussed return precautions with the patient including worsening chest pain, chest pain unrelieved by nitroglycerin, and shortness of breath. Advised patient to f/u with her cardiologist.    Final Clinical Impressions(s) / ED Diagnoses   Final diagnoses:  None    ED Discharge Orders    None  Kahdijah Errickson, Andree Elk, MD 03/07/18 1919    Rex Kras Wenda Overland, MD 03/11/18 1754

## 2018-03-07 NOTE — ED Notes (Signed)
Attempted blood draw x 1. Unsuccessful. Pt does not want me to try again. Phlebotomy notified.

## 2018-03-07 NOTE — ED Notes (Signed)
Patient verbalizes understanding of discharge instructions. Opportunity for questioning and answers were provided. Armband removed by staff, pt discharged from ED ambulatory.   

## 2018-03-07 NOTE — Progress Notes (Signed)
Patient ID: Sheri Hernandez, female    DOB: [birth year], 62 y.o.   MRN: [id number]  PCP: Susy Frizzle, MD  Chief Complaint  Patient presents with  . Head & chest congestion, st, cough    Subjective:   Sheri Hernandez is a 62 y.o. female, presents to clinic with CC of 4 days of sore throat, head and chest congestion, nasal discharge, and cough.  4 days ago it began with a scratchy sore throat x 2 days, then he began sneezing, followed by heaviness and congestion in chest and worsening nasal pain and pressure with congestion.  She believes she became ill after her son was visiting and was sick with similar symptoms a few days prior to the beginning of her symptoms.  She has mild tightness in her chest associated with cough but otherwise denies any productive sputum, wheeze, shortness of breath, chest pain fevers.  Has had associated hot cold chills.  She does have cancer and is currently treating it with Opdiva, immune therapy which is supposed to stimulate her immune system.  They will not be able to tell if she is having an infection or to side effect of the medication.  Concern with past medical history of emphysema, heart disease and bypass surgery.  Per chart review she additionally has history of congestive heart failure, hypertension.  Cancer is a renal cell adenocarcinoma with metastases to lungs however lung nodules have been stable and decreasing in size with treatment since 2014.  Pt is a former smoker, 2014.    BP mildly elevated today, She notes that is it always normal at other appointments, and occasionally elevated with her early morning appointments.  She states she is not taking her Lasix due to urinary frequency and caused her not be able to go anywhere, she has some associated lower extremity edema because of noncompliance to Lasix.  She denies orthopnea, PND, palpitations.  Patient Active Problem List   Diagnosis Date Noted  . Chronic diastolic heart failure ([place])  06/16/2017  . Hyperlipidemia 12/14/2016  . CAD (coronary artery disease) 10/12/2016  . Anxiety 10/10/2016  . Near syncope   . Chest pain 10/08/2016  . Essential hypertension 06/01/2016  . GERD (gastroesophageal reflux disease) 03/18/2015  . Acute blood loss anemia 01/12/2015  . Hyperglycemia 01/12/2015  . History of non-ST elevation myocardial infarction (NSTEMI)   . S/P CABG x 2 01/03/2015  . Drug-induced hypothyroidism 03/13/2014  . S/p nephrectomy   . Adenocarcinoma, renal cell ([place]) 11/12/2012  . Lung mass 10/01/2012  . Renal mass 10/01/2012  . Mass of left breast 08/01/2011     Prior to Admission medications   Medication Sig Start Date End Date Taking? Authorizing Provider  Acetaminophen (TYLENOL PO) Take 2 tablets by mouth every 6 (six) hours as needed (headache).   Yes [provider]  aspirin 81 MG EC tablet Take 1 tablet (81 mg total) by mouth daily. 02/09/16  Yes Sherren Mocha, MD  atorvastatin (LIPITOR) 40 MG tablet TAKE 1 TABLET (40 MG TOTAL) BY MOUTH DAILY. 04/18/17  Yes Sherren Mocha, MD  Cholecalciferol (VITAMIN D PO) Take 1 tablet by mouth daily.   Yes [provider]  clopidogrel (PLAVIX) 75 MG tablet Take 1 tablet (75 mg total) by mouth daily. 10/10/17  Yes Sherren Mocha, MD  cyclobenzaprine (FLEXERIL) 10 MG tablet Take 1 tablet (10 mg total) by mouth 3 (three) times daily as needed for muscle spasms. 07/06/16  Yes Orlena Sheldon, PA-C  famotidine (PEPCID) 20 MG tablet Take 1 tablet (20 mg total) by mouth daily. 05/29/17  Yes Theora Gianotti, NP  furosemide (LASIX) 40 MG tablet Take 1 tablet (40 mg total) by mouth daily. 09/08/17 09/03/18 Yes Sherren Mocha, MD  HYDROcodone-acetaminophen (NORCO/VICODIN) 5-325 MG tablet Take 1 tablet by mouth every 4 (four) hours as needed for moderate pain. 07/06/16  Yes Orlena Sheldon, PA-C  isosorbide mononitrate (IMDUR) 60 MG 24 hr tablet Take 1 tablet (60 mg total) by mouth daily. 02/02/18  Yes Sherren Mocha, MD  levothyroxine (SYNTHROID, LEVOTHROID) 137 MCG tablet TAKE 1 TABLET (137 MCG TOTAL) BY MOUTH DAILY BEFORE BREAKFAST. 01/10/18  Yes Susy Frizzle, MD  lidocaine-prilocaine (EMLA) cream Apply 1 application topically See admin instructions. Apply cream to portacath site approximately 1 hour before access and cover 09/24/16  Yes [provider]  loratadine (CLARITIN) 10 MG tablet Take 1 tablet (10 mg total) by mouth daily. 05/29/17  Yes Theora Gianotti, NP  metoprolol tartrate (LOPRESSOR) 50 MG tablet Take 1.5 tablets (75 mg total) by mouth 2 (two) times daily. 09/08/17 09/03/18 Yes Sherren Mocha, MD  nitroGLYCERIN (NITROSTAT) 0.4 MG SL tablet PLACE 1 TABLET (0.4 MG TOTAL) UNDER THE TONGUE EVERY 5 (FIVE) MINUTES AS NEEDED FOR CHEST PAIN. 10/30/17  Yes Sherren Mocha, MD  oxycodone (OXY-IR) 5 MG capsule Take 5 mg by mouth daily as needed (severe pain). pain 08/30/16  Yes [provider]     Allergies  Allergen Reactions  . Silver Dermatitis    Please use mepalex     Family History  Problem Relation Age of Onset  . Heart disease Mother   . Heart disease Father   . Heart attack Father   . Cancer Maternal Grandfather   . Heart disease Brother   . Stroke Brother   . Aortic dissection Brother   . Colon cancer Maternal Aunt   . Skin cancer Maternal Uncle   . Stomach cancer Maternal Uncle   . Heart disease Maternal Grandmother   . Breast cancer Maternal Aunt      Social History   Socioeconomic History  . Marital status: Married    Spouse name: Not on file  . Number of children: 0  . Years of education: Not on file  . Highest education level: Not on file  Occupational History  . 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   Social Needs  . Financial resource strain: Not on file  . Food insecurity:    Worry: Not on file    Inability: Not on file  . Transportation needs:    Medical: Not on file    Non-medical: Not on file   Tobacco Use  . Smoking status: Former Smoker    Packs/day: 1.50    Years: 40.00    Pack years: 60.00    Types: Cigarettes    Last attempt to quit: 09/24/2012    Years since quitting: 5.4  . Smokeless tobacco: Never Used  Substance and Sexual Activity  . Alcohol use: Yes    Comment: 05/26/2017 "couple drinks/year"  . Drug use: No  . Sexual activity: Yes  Lifestyle  . Physical activity:    Days per week: Not on file    Minutes per session: Not on file  . Stress: Not on file  Relationships  . Social connections:    Talks on phone: Not on file    Gets together: Not on file    Attends religious  service: Not on file    Active member of club or organization: Not on file    Attends meetings of clubs or organizations: Not on file    Relationship status: Not on file  . Intimate partner violence:    Fear of current or ex partner: Not on file    Emotionally abused: Not on file    Physically abused: Not on file    Forced sexual activity: Not on file  Other Topics Concern  . Not on file  Social History Narrative  . Not on file     Review of Systems  Constitutional: Negative for activity change, appetite change and unexpected weight change.  HENT: Positive for congestion, postnasal drip, rhinorrhea, sinus pressure, sinus pain, sneezing and sore throat. Negative for ear discharge, ear pain, facial swelling and trouble swallowing.   Eyes: Negative.   Respiratory: Positive for cough.   Cardiovascular: Positive for leg swelling. Negative for chest pain and palpitations.  Gastrointestinal: Negative.   Endocrine: Negative.   Genitourinary: Negative.   Musculoskeletal: Negative.   Skin: Negative.   Allergic/Immunologic: Negative.   Neurological: Negative.   Hematological: Negative.   Psychiatric/Behavioral: Negative.   All other systems reviewed and are negative.      Objective:    Vitals:   03/07/18 0914  BP: (!) 146/100  Pulse: 84  Resp: 18  Temp: 97.9 F (36.6 C)    TempSrc: Oral  SpO2: 97%  Weight: 204 lb (92.5 kg)  Height: 5\' 8"  (1.727 m)      Physical Exam  Constitutional: She is oriented to person, place, and time. She appears well-developed and well-nourished.  Non-toxic appearance. No distress.  HENT:  Head: Normocephalic and atraumatic.  Right Ear: External ear normal.  Left Ear: External ear normal.  Nose: Nose normal.  Mouth/Throat: Uvula is midline, oropharynx is clear and moist and mucous membranes are normal.  Eyes: Pupils are equal, round, and reactive to light. Conjunctivae, EOM and lids are normal.  Neck: Normal range of motion and phonation normal. Neck supple. No tracheal deviation present.  Cardiovascular: Normal rate, regular rhythm, normal heart sounds and normal pulses. Exam reveals no gallop and no friction rub.  No murmur heard. Pulses:      Radial pulses are 2+ on the right side, and 2+ on the left side.       Posterior tibial pulses are 2+ on the right side, and 2+ on the left side.  Pulmonary/Chest: Effort normal and breath sounds normal. No stridor. No respiratory distress. She has no wheezes. She has no rhonchi. She has no rales. She exhibits no tenderness.  Abdominal: Soft. Normal appearance and bowel sounds are normal. She exhibits no distension. There is no tenderness. There is no rebound and no guarding.  Musculoskeletal: Normal range of motion. She exhibits no edema or deformity.  Lymphadenopathy:    She has no cervical adenopathy.  Neurological: She is alert and oriented to person, place, and time. She exhibits normal muscle tone. Gait normal.  Skin: Skin is warm, dry and intact. Capillary refill takes less than 2 seconds. No rash noted. She is not diaphoretic. No pallor.  Psychiatric: She has a normal mood and affect. Her speech is normal and behavior is normal.  Nursing note and vitals reviewed.         Assessment & Plan:      ICD-10-CM   1. Acute pansinusitis, recurrence not specified J01.40  amoxicillin-clavulanate (AUGMENTIN) 875-125 MG tablet  2. Bronchitis J40 albuterol (PROVENTIL  HFA;VENTOLIN HFA) 108 (90 Base) MCG/ACT inhaler    benzonatate (TESSALON) 100 MG capsule    ipratropium-albuterol (DUONEB) 0.5-2.5 (3) MG/3ML nebulizer solution 3 mL  3. Renal cell carcinoma, unspecified laterality (HCC) C64.9   4. Chronic diastolic heart failure (HCC) I50.32     Pt with stage 4 RCC, having active tx with Opdivo (chemo/antineoplastic agent) presents with URI sx x 4 days, also has reported emphysema and CAD s/p bypass and CHF, pt not taking lasix as directed.  Will cover with abx for comorbidities and immunocompromise.  Lungs CTA, but pt endorses congestion and tightness, breathing treatment given, she felt improved after neb, will rx prednisone and albuterol with abx coverage for sinusitis given her comorbidities.     Delsa Grana, PA-C 03/09/18 10:55 PM

## 2018-03-07 NOTE — Discharge Instructions (Addendum)
You have been diagnosed with stable angina. Please return to the ED if you have worsening chest pain, chest pain at rest, chest pain not relieved by nitroglycerin, or shortness or breath. Please follow up with your cardiologist within the next week.

## 2018-03-07 NOTE — ED Triage Notes (Signed)
Pt in c/o chest pain and dizziness that worsened today while walking, pain radiated down bilateral arms and into her neck, denies shortness of breath, denies n/v

## 2018-03-09 ENCOUNTER — Telehealth: Payer: Self-pay | Admitting: Cardiovascular Disease

## 2018-03-09 NOTE — Telephone Encounter (Signed)
New message   Patient returned schedulers call, to schedule f/u from hospital per staff message per Sharrell Ku   Patient declined to schedule with APP. Patient got angry when she was told the next available with Dr Burt Knack. Patient requesting Dr Burt Knack accommodate her.

## 2018-03-12 NOTE — Telephone Encounter (Signed)
Patient adamantly refuses to see anyone other than Dr. Burt Knack.  Scheduled her for evaluation with Dr. Burt Knack 8/19 at 1520. She was grateful for assistance.

## 2018-03-12 NOTE — Telephone Encounter (Signed)
Left message to call back  

## 2018-03-12 NOTE — Telephone Encounter (Signed)
Follow up    Patient calling, states she only wants to see Dr Burt Knack. Please advise

## 2018-03-15 ENCOUNTER — Other Ambulatory Visit: Payer: 59

## 2018-03-21 ENCOUNTER — Telehealth: Payer: Self-pay | Admitting: Cardiovascular Disease

## 2018-03-21 NOTE — Telephone Encounter (Signed)
I called and left a message asking her to call back. She was just in the hospital 03/07/18 and has an appointment with Dr Burt Knack 8/16. Unless this an urgent dental procedure it should probably wait and can be addressed at that office visit 8/16.  Kerin Ransom PA-C 03/21/2018 2:38 PM

## 2018-03-21 NOTE — Telephone Encounter (Signed)
1. What dental office are you calling from? Dr Johny Shock -Periodontist   2. What is your office phone number? [phone]   3. What is your fax number? [phone]  4. What type of procedure is the patient having performed? Scaling and Root Planing - They are doing part of this morning-Need this asap  5. What date is procedure scheduled or is the patient there now? 03-21-18 and 03-06-18   6. What is your question (ex. Antibiotics prior to procedure, holding medication-we need to know how long dentist wants pt to hold med)?General Medical Clearance

## 2018-03-21 NOTE — Telephone Encounter (Signed)
   [facility] Pre-operative Risk Assessment    Request for surgical clearance:  1. What type of surgery is being performed? Scaling and Root Planing   2. When is this surgery scheduled? 03/26/18   3. What type of clearance is required (medical clearance vs. Pharmacy clearance to hold med vs. Both)? Medical  4. Are there any medications that need to be held prior to surgery and how long? None   5. Practice name and name of physician performing surgery? Dr Johny Shock   6. What is your office phone number: [phone]    7.   What is your office fax number: [phone]  8.   Anesthesia type (None, local, MAC, general) ? Unknown   Dollene Primrose 03/21/2018, 10:09 AM  _________________________________________________________________   (provider comments below)

## 2018-03-22 ENCOUNTER — Telehealth: Payer: Self-pay | Admitting: *Deleted

## 2018-03-22 DIAGNOSIS — C642 Malignant neoplasm of left kidney, except renal pelvis: Secondary | ICD-10-CM | POA: Diagnosis not present

## 2018-03-22 DIAGNOSIS — Z5112 Encounter for antineoplastic immunotherapy: Secondary | ICD-10-CM | POA: Diagnosis not present

## 2018-03-22 DIAGNOSIS — Z79899 Other long term (current) drug therapy: Secondary | ICD-10-CM | POA: Diagnosis not present

## 2018-03-22 NOTE — Telephone Encounter (Signed)
SPOKE WITH PT WHO WAS VERY AGITATED DUE TO THE FACT OF HAVING TO WAIT FOR APPT WITH DR Burt Knack ON 04-20-18 FOR TEETH CLEANING.  PT WAS EXPLAIN TO THAT BASED UPON [place] SUGGESTION WAS TO WAIT TO AFTER APPT WITH COOPER ON 04-20-18 UNLESS THIS WAS AN URGENT PROCEDURE.  PT  AND HUSBAND WENT BACK AND FORTH HIGHLY UPSET  BECAUSE I WAS SAYING SURGICAL CLEARANCE WHICH IS THE DEPARTMENT THAT PROCEDURES COME THROUGH AS EXPLAINED.  PT AND HUSBAND DOESN'T BELIEVE AN PA SHOULD BE MAKING THIS CALL AND WOULD LIKE A MEDICAL DOCTER AS DR.  Burt Knack OR DR COOPER IF POSSIBLE.  PT AND HUSBAND WANTED TO KNOW BUSINESS HOURS AND SAID THEY'LL BE HER IN THE AM .

## 2018-03-22 NOTE — Telephone Encounter (Addendum)
   Primary Cardiologist: Sherren Mocha, MD  Chart reviewed as part of pre-operative protocol coverage. Given past medical history and time since last visit, based on ACC/AHA guidelines, Sheri Hernandez would be at acceptable risk for the planned procedure without further cardiovascular testing.  Pt stated she is fine since ER visit on phone call earlier.  I discussed case with Dr. Tamala Julian DOD and she is cleared for procedure.  She may hold plavix for 5-7 days if needed.   I will route this recommendation to the requesting party via Epic fax function and remove from pre-op pool.  Please call with questions.  [name], NP 03/22/2018, 4:45 PM

## 2018-04-05 DIAGNOSIS — R21 Rash and other nonspecific skin eruption: Secondary | ICD-10-CM | POA: Diagnosis not present

## 2018-04-05 DIAGNOSIS — C641 Malignant neoplasm of right kidney, except renal pelvis: Secondary | ICD-10-CM | POA: Diagnosis not present

## 2018-04-05 DIAGNOSIS — C642 Malignant neoplasm of left kidney, except renal pelvis: Secondary | ICD-10-CM | POA: Diagnosis not present

## 2018-04-05 DIAGNOSIS — E119 Type 2 diabetes mellitus without complications: Secondary | ICD-10-CM | POA: Diagnosis not present

## 2018-04-10 IMAGING — DX DG CHEST 2V
2 series · 2 of 2 positions shown · non-contrast
Comparison: 05/23/2017.

CLINICAL DATA: Chest pain.  Shortness of breath.

EXAM:
CHEST  2 VIEW

[chest pa]
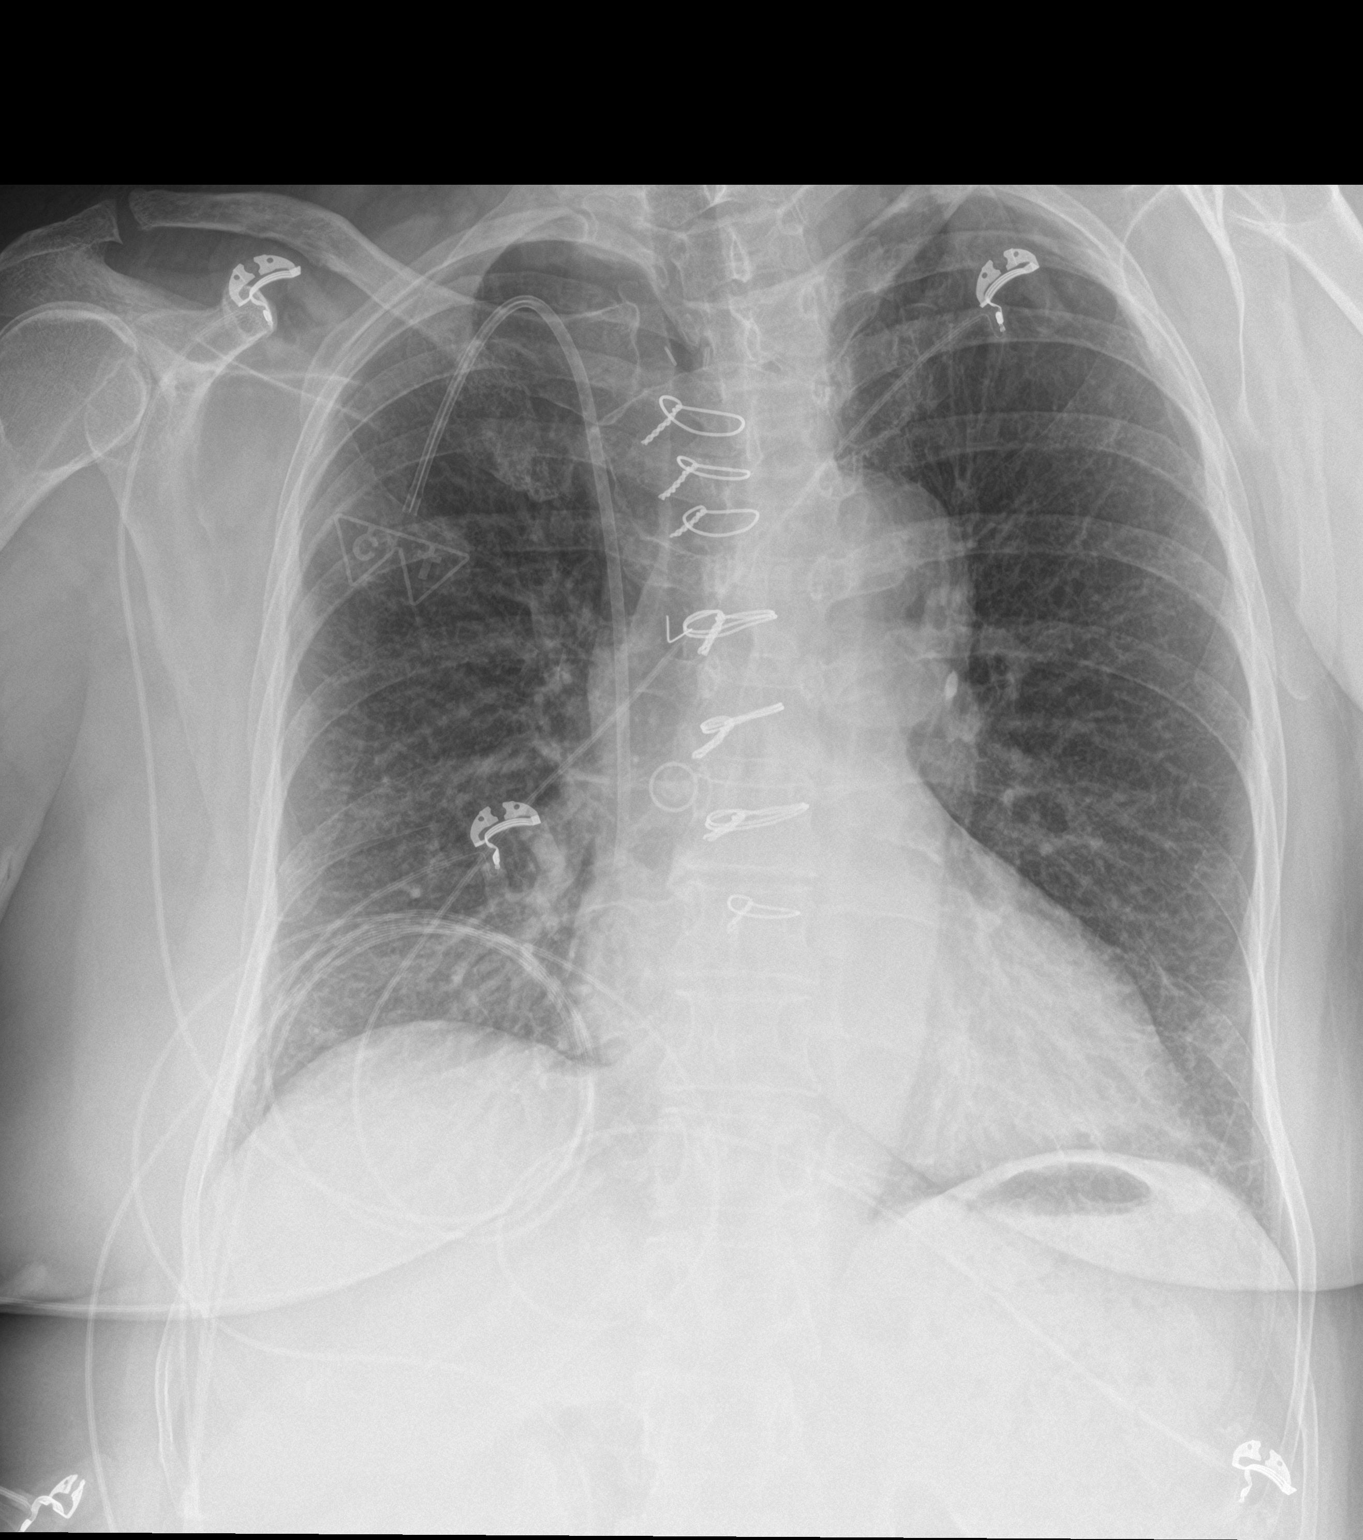

[chest lat]
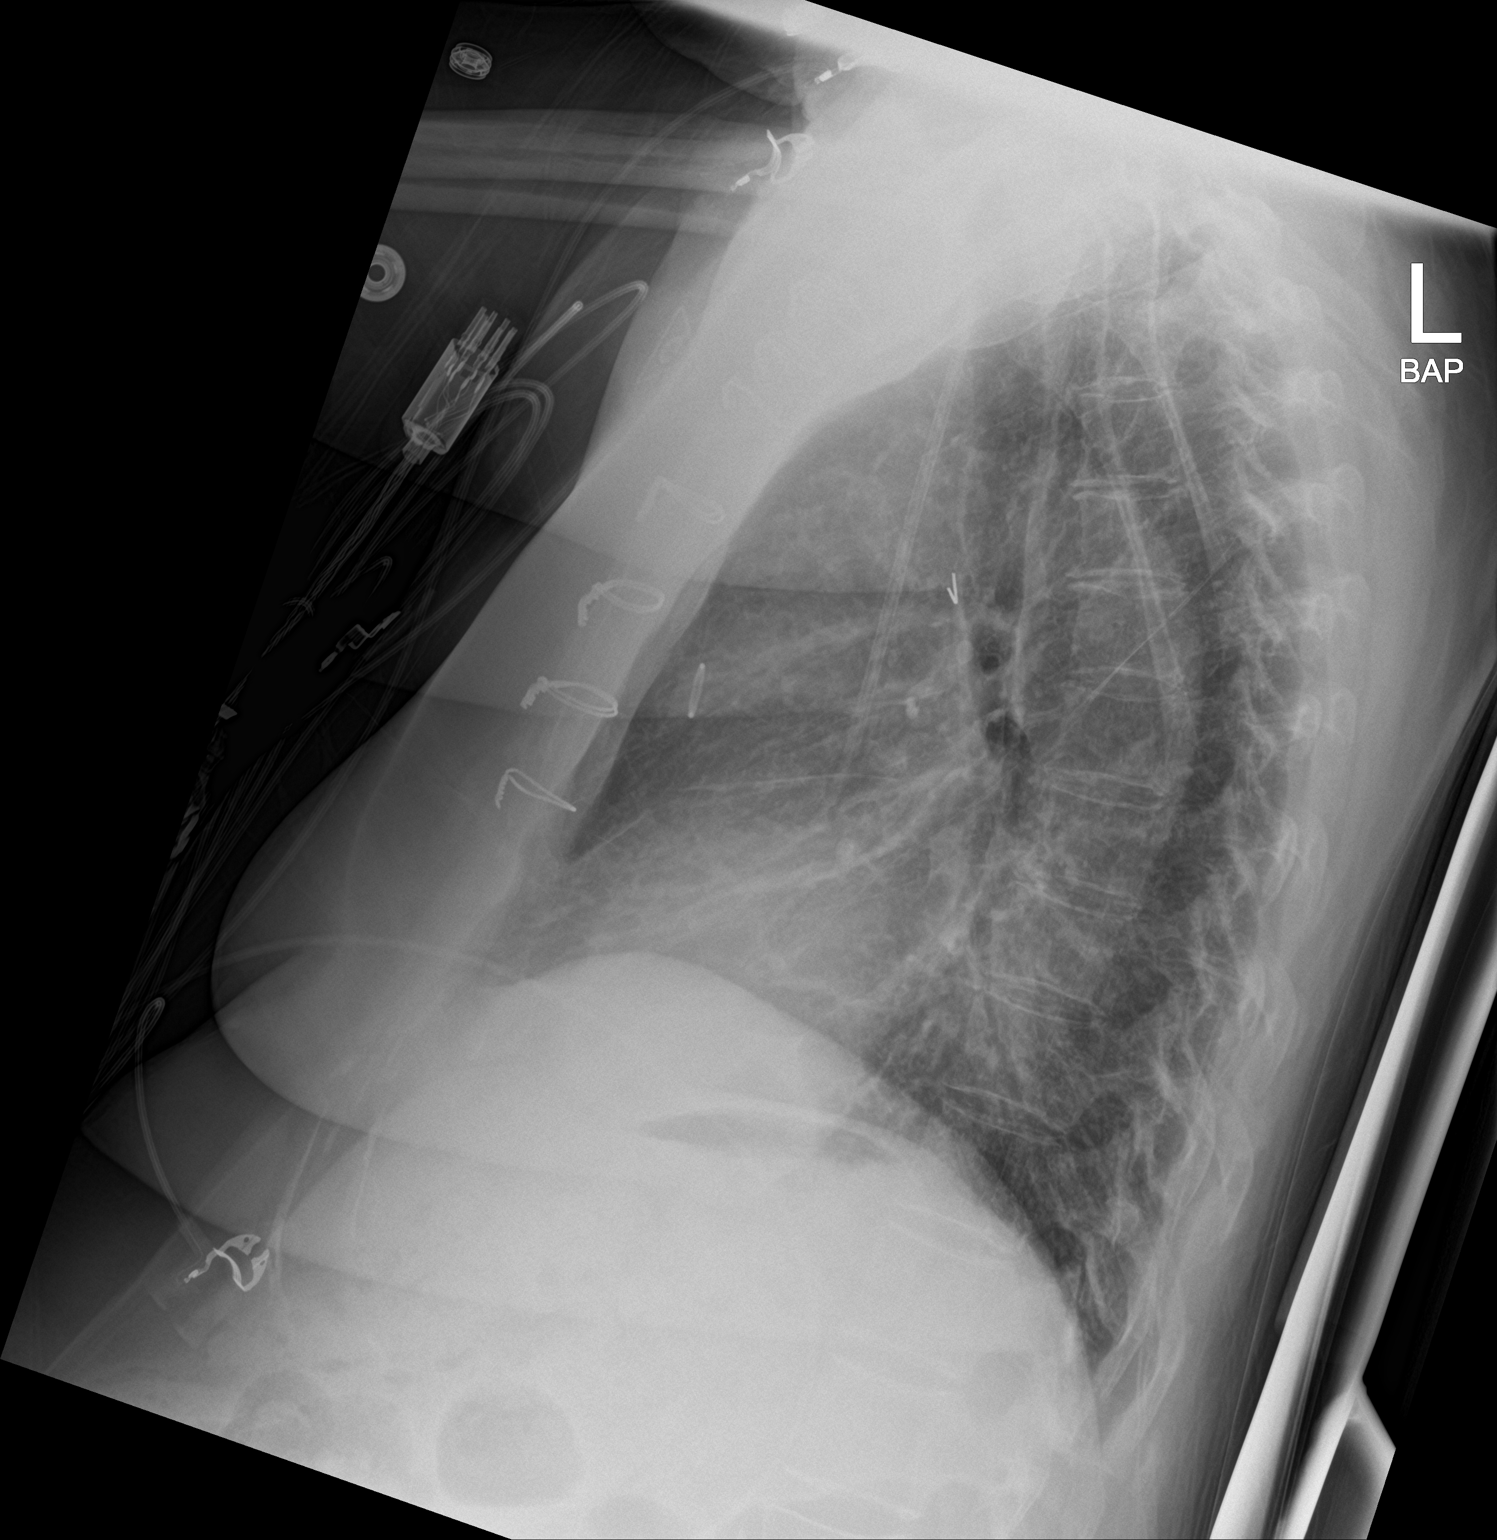

[2 of 2 positions shown; findings below may reference images not displayed]

FINDINGS: The cardiac silhouette remains mildly enlarged. Stable post CABG
changes. The right jugular porta catheter is unchanged. The lungs
are clear and the interstitial markings remain mildly prominent.
Mild thoracic spine degenerative changes.
IMPRESSION: No acute abnormality.

## 2018-04-19 DIAGNOSIS — Z5112 Encounter for antineoplastic immunotherapy: Secondary | ICD-10-CM | POA: Diagnosis not present

## 2018-04-19 DIAGNOSIS — C641 Malignant neoplasm of right kidney, except renal pelvis: Secondary | ICD-10-CM | POA: Diagnosis not present

## 2018-04-19 DIAGNOSIS — C642 Malignant neoplasm of left kidney, except renal pelvis: Secondary | ICD-10-CM | POA: Diagnosis not present

## 2018-04-23 ENCOUNTER — Encounter: Payer: Self-pay | Admitting: Cardiovascular Disease

## 2018-04-23 ENCOUNTER — Ambulatory Visit (INDEPENDENT_AMBULATORY_CARE_PROVIDER_SITE_OTHER): Payer: 59 | Admitting: Cardiovascular Disease

## 2018-04-23 VITALS — BP 108/70 | HR 82 | Ht 68.0 in | Wt 204.8 lb

## 2018-04-23 DIAGNOSIS — E782 Mixed hyperlipidemia: Secondary | ICD-10-CM

## 2018-04-23 DIAGNOSIS — I25119 Atherosclerotic heart disease of native coronary artery with unspecified angina pectoris: Secondary | ICD-10-CM

## 2018-04-23 NOTE — Patient Instructions (Addendum)
Medication Instructions:  Your provider recommends that you continue on your current medications as directed. Please refer to the Current Medication list given to you today.    Labwork: None  Testing/Procedures: None  Follow-Up: You have an appointment with Dr. Burt Knack on August 08, 2018 at 9:00AM.  Any Other Special Instructions Will Be Listed Below (If Applicable).   For your  leg edema you should do  the following 1. Leg elevation - I recommend the Lounge Dr. Leg rest.  See below for details  2. Salt restriction  -  Use potassium chloride instead of regular salt as a salt substitute. 3. Walk regularly 4. Compression hose - [facility] 5. Weight loss    Available on [email or web address] Or  Go to [email or web address]

## 2018-04-23 NOTE — Progress Notes (Signed)
Cardiology Office Note Date:  04/23/2018   ID:  Sheri Hernandez, DOB 1956-04-09, MRN [id number]  PCP:  Susy Frizzle, MD  Cardiologist:  Sherren Mocha, MD    Chief Complaint  Patient presents with  . Chest Pain   History of Present Illness: Sheri Hernandez is a 62 y.o. female who presents for follow-up of coronary artery disease and chronic angina.  Patient has a complex history with metastatic renal cell carcinoma status post left nephrectomy.  She presented with non-STEMI in 2016 and underwent complex PCI of severe stenosis in the right coronary artery complicated by extensive dissection and acute inferior STEMI.  She required emergency CABG with a saphenous vein graft to the right PDA and PLA.  She had recurrent angina with cardiac catheterization demonstrating occlusion of her bypass graft.  Her RCA remained patent with residual tight stenosis of the RCA with medical therapy recommended.  She recently developed an upper respiratory infection about 6 weeks ago and used bronchodilator therapy which precipitated an episode of angina.  She was evaluated in the emergency room and was noted to have negative troponins.  After a period of observation she was discharged home and she returns today for follow-up evaluation.  She is here alone today.  Main complaint is leg swelling.  She is had no recurrent chest pain or shortness of breath.  No orthopnea or PND.  No heart palpitations.  She is excited about her upcoming trip to [place].  Past Medical History:  Diagnosis Date  . Anxiety   . Arthritis    "maybe in my right hand" (05/26/2017)  . CAD (coronary artery disease)    a. 12/2014 Cath: LM nl, LAD 20p, 32m, D2 20ost, LCX 95p, 54m, RCA dominant, 95p, 71m/d, RPDA 95, RPL2 50, attempted RCA PCI w/ acute RCA occlusion-->CABG x 2 VG->RPDA->PLVB.  Marland Kitchen Chronic diastolic CHF (congestive heart failure) ([place])   . Hypertension   . Hypoalbuminemia   . Hypothyroidism   . Lower extremity edema   .  Lung mass    LUNG MASSES--COUGH-BIOPSY NON-DIAGNOSTIC.  HX OF MEDIASTINAL MASS AND LUNG MASSES AGE 31 -NEGATIVE BIOSPY-BUT GIVEN DIAGNOSIS OF SARCOIDOSIS.  WORK UP CONTINUING ON DIAGNOSIS FOR PT'S LUNG MASSES--SHE HAS BIOPSY PROVEN KIDNEY CANCER  . Myocardial infarction ([place]) 12/2014 X 2  . 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  . Papilloma of breast 2012   left. lumpectomy  . Pulmonary nodules   . Renal cell cancer [facility])    metastatic renal cell carcinoma (biopsy of left renal mass 10/2012) On nivolumab at [facility]  . S/p nephrectomy    left, due to metastatic RCC, 3/14    Past Surgical History:  Procedure Laterality Date  . BREAST BIOPSY  08/18/2011   Procedure: BREAST BIOPSY WITH NEEDLE LOCALIZATION;  Surgeon: Merrie Roof, MD;  Location: [facility];  Service: General;  Laterality: Left;  left breast biopsy with needle localization  . BRONCHIAL BRUSH BIOPSY  2014  . CARDIAC CATHETERIZATION    . CORONARY ARTERY BYPASS GRAFT N/A 01/02/2015   Procedure: CORONARY ARTERY BYPASS GRAFTING (CABG)TIMES TWO USING RIGHT GREATER SAPHENOUS LEG VEIN HARVESTED ENDOSCOPICALLY;  Surgeon: [name], MD;  Location: [facility];  Service: Open Heart Surgery;  Laterality: N/A;  .  LAPAROSCOPIC NEPHRECTOMY Left 11/22/2012   Procedure: LAPAROSCOPIC NEPHRECTOMY;  Surgeon: Dutch Gray, MD;  Location: [facility];  Service: Urology;  Laterality: Left;  . LEFT HEART CATH AND CORS/GRAFTS ANGIOGRAPHY N/A 10/11/2016   Procedure: Left Heart Cath and Cors/Grafts Angiography;  Surgeon: Sherren Mocha, MD;  Location: [facility];  Service: Cardiovascular;  Laterality: N/A;  . LEFT HEART CATHETERIZATION WITH CORONARY ANGIOGRAM N/A 01/02/2015   Procedure: LEFT HEART CATHETERIZATION WITH CORONARY ANGIOGRAM;  Surgeon: Leonie Man, MD;  Location: [facility] C[facility] LAB;  Service: Cardiovascular;  Laterality: N/A;  . NECK LESION BIOPSY Left 08/2016  . PORTA CATH INSERTION Right ~ 2017  . TONSILLECTOMY  1967  . VIDEO BRONCHOSCOPY  10/11/2012   Procedure: VIDEO BRONCHOSCOPY WITH FLUORO;  Surgeon: Kathee Delton, MD;  Location: [facility];  Service: Cardiopulmonary;  Laterality: Bilateral;  . WIDS    . WISDOM TOOTH EXTRACTION     "all 4"    Current Outpatient Medications  Medication Sig Dispense Refill  . Acetaminophen (TYLENOL PO) Take 2 tablets by mouth every 6 (six) hours as needed (headache).    Marland Kitchen aspirin 81 MG EC tablet Take 1 tablet (81 mg total) by mouth daily.    Marland Kitchen atorvastatin (LIPITOR) 40 MG tablet TAKE 1 TABLET (40 MG TOTAL) BY MOUTH DAILY. 90 tablet 3  . Cholecalciferol (VITAMIN D PO) Take 1 tablet by mouth daily.    . clopidogrel (PLAVIX) 75 MG tablet Take 1 tablet (75 mg total) by mouth daily. 90 tablet 3  . famotidine (PEPCID) 20 MG tablet Take 1 tablet (20 mg total) by mouth daily. 30 tablet 2  . furosemide (LASIX) 40 MG tablet Take 1 tablet (40 mg total) by mouth daily. 90 tablet 3  . HYDROcodone-acetaminophen (NORCO/VICODIN) 5-325 MG tablet Take 1 tablet by mouth every 4 (four) hours as needed for moderate pain. 20 tablet 0  . isosorbide mononitrate (IMDUR) 60 MG 24 hr tablet Take 1 tablet (60 mg total) by mouth daily. 90 tablet 3  . levothyroxine (SYNTHROID, LEVOTHROID) 137 MCG tablet TAKE 1 TABLET (137 MCG TOTAL) BY MOUTH DAILY BEFORE BREAKFAST. 30 tablet 0  . lidocaine-prilocaine (EMLA) cream Apply 1 application topically See admin instructions. Apply cream to portacath site approximately 1 hour before access and cover  3  . loratadine (CLARITIN) 10 MG tablet Take 1 tablet (10 mg total) by mouth daily. 30 tablet 2  . metoprolol tartrate (LOPRESSOR) 50 MG tablet Take 1.5 tablets (75 mg total) by mouth 2 (two) times daily. 270 tablet 3  . nitroGLYCERIN (NITROSTAT) 0.4 MG SL tablet PLACE 1 TABLET (0.4 MG  TOTAL) UNDER THE TONGUE EVERY 5 (FIVE) MINUTES AS NEEDED FOR CHEST PAIN. 25 tablet 5  . oxycodone (OXY-IR) 5 MG capsule Take 5 mg by mouth daily as needed (severe pain). pain    . triamcinolone cream (KENALOG) 0.1 % Apply 1 application topically 2 (two) times daily.    Marland Kitchen albuterol (PROVENTIL HFA;VENTOLIN HFA) 108 (90 Base) MCG/ACT inhaler Inhale 2 puffs into the lungs every 4 (four) hours as needed for wheezing or shortness of breath. (Patient not taking: Reported on 04/23/2018) 1 Inhaler 0  . amoxicillin-clavulanate (AUGMENTIN) 875-125 MG tablet Take 1 tablet by mouth 2 (two) times daily. (Patient not taking: Reported on 04/23/2018) 20 tablet 0  . benzonatate (TESSALON) 100 MG capsule Take 1 capsule (100 mg total) by mouth 3 (three) times daily as needed for cough. (Patient not taking: Reported on 04/23/2018) 30 capsule 0  No current facility-administered medications for this visit.     Allergies:   Silver   Social History:  The patient  reports that she quit smoking about 5 years ago. Her smoking use included cigarettes. She has a 60.00 pack-year smoking history. She has never used smokeless tobacco. She reports that she drinks alcohol. She reports that she does not use drugs.   Family History:  The patient's family history includes Aortic dissection in her brother; Breast cancer in her maternal aunt; Cancer in her maternal grandfather; Colon cancer in her maternal aunt; Heart attack in her father; Heart disease in her brother, father, maternal grandmother, and mother; Skin cancer in her maternal uncle; Stomach cancer in her maternal uncle; Stroke in her brother.    ROS:  Please see the history of present illness.  All other systems are reviewed and negative.    PHYSICAL EXAM: VS:  BP 108/70   Pulse 82   Ht 5\' 8"  (1.727 m)   Wt 204 lb 12.8 oz (92.9 kg)   SpO2 94%   BMI 31.14 kg/m  , BMI Body mass index is 31.14 kg/m. GEN: Well nourished, well developed, in no acute distress  HEENT:  normal  Neck: no JVD, no masses. No carotid bruits Cardiac: RRR without murmur or gallop      Respiratory:  clear to auscultation bilaterally, normal work of breathing GI: soft, nontender, nondistended, + BS MS: no deformity or atrophy  Ext: 2+ bilateral pretibial edema, pedal pulses 2+= bilaterally Skin: warm and dry, no rash Neuro:  Strength and sensation are intact Psych: euthymic mood, full affect  EKG:  EKG is not ordered today.  Recent Labs: 05/27/2017: ALT 31; B Natriuretic Peptide 70.1; TSH 1.464 03/07/2018: BUN 14; Creatinine, Ser 1.02; Hemoglobin 14.7; Platelets 343; Potassium 3.7; Sodium 142   Lipid Panel     Component Value Date/Time   CHOL 90 01/03/2015 0210   TRIG 77 01/03/2015 0210   HDL 15 (L) 01/03/2015 0210   CHOLHDL 6.0 01/03/2015 0210   VLDL 15 01/03/2015 0210   LDLCALC 60 01/03/2015 0210      Wt Readings from Last 3 Encounters:  04/23/18 204 lb 12.8 oz (92.9 kg)  03/07/18 204 lb (92.5 kg)  02/02/18 202 lb 12.8 oz (92 kg)     Cardiac Studies Reviewed: Echo 05-27-2017: Left ventricle:  The cavity size was normal. Wall thickness was normal. Systolic function was vigorous. The estimated ejection fraction was in the range of 65% to 70%. Doppler parameters are consistent with abnormal left ventricular relaxation (grade 1 diastolic dysfunction).  ------------------------------------------------------------------- Aortic valve:   Mildly thickened leaflets.  Doppler:  There was no significant regurgitation.  ------------------------------------------------------------------- Aorta:  The aorta was normal, not dilated, and non-diseased.  ------------------------------------------------------------------- Mitral valve:   Structurally normal valve.   Leaflet separation was normal.  Doppler:  Transvalvular velocity was within the normal range. There was no evidence for stenosis. There was  trivial regurgitation.  ------------------------------------------------------------------- Left atrium:  The atrium was normal in size.  ------------------------------------------------------------------- Right ventricle:  The cavity size was normal. Wall thickness was normal. Systolic function was normal.  ------------------------------------------------------------------- Pulmonic valve:    Structurally normal valve.   Cusp separation was normal.  Doppler:  Transvalvular velocity was within the normal range. There was no regurgitation.  ------------------------------------------------------------------- Tricuspid valve:   Structurally normal valve.   Leaflet separation was normal.  Doppler:  Transvalvular velocity was within the normal range. There was trivial regurgitation.  ------------------------------------------------------------------- Right atrium:  The  atrium was normal in size.  ------------------------------------------------------------------- Pericardium:  There was no pericardial effusion.  ------------------------------------------------------------------- Systemic veins: Inferior vena cava: The vessel was normal in size. The respirophasic diameter changes were in the normal range (>= 50%), consistent with normal central venous pressure.  ------------------------------------------------------------------- Post procedure conclusions Ascending Aorta:  - The aorta was normal, not dilated, and non-diseased.   ASSESSMENT AND PLAN: 1.  Coronary artery disease, native vessel, with angina: The patient's recent episode appears to be related to use of a bronchodilator with expected increased heart rate.  She is had no recurrent symptoms.  I reviewed her emergency room evaluation and she had negative troponin studies and an unremarkable EKG.  I think she continue on her current medical program without any changes today.  2.  Hyperlipidemia: The patient is  treated with atorvastatin 40 mg daily.  3.  Leg edema: Suspect venous insufficiency.  She has had a modest response to diuretic therapy with furosemide 40 mg daily.  She can get compression stockings on, but may consider over-the-counter compression stockings.  We gave her information regarding proper leg elevation today.  I talked to her about potassium supplementation which she states she cannot tolerate.  She will eat potassium rich foods when she takes diuretics.  We also discussed sodium restriction.  Current medicines are reviewed with the patient today.  The patient does not have concerns regarding medicines.  Labs/ tests ordered today include:  No orders of the defined types were placed in this encounter.   Disposition:   FU 4 months  Signed, Sherren Mocha, MD  04/23/2018 3:56 PM    [address], [place], [place]  [address] Phone: [phone]; Fax: [phone]

## 2018-05-13 ENCOUNTER — Other Ambulatory Visit: Payer: Self-pay | Admitting: Cardiovascular Disease

## 2018-05-13 DIAGNOSIS — R609 Edema, unspecified: Secondary | ICD-10-CM

## 2018-05-13 DIAGNOSIS — R002 Palpitations: Secondary | ICD-10-CM

## 2018-05-13 DIAGNOSIS — I2510 Atherosclerotic heart disease of native coronary artery without angina pectoris: Secondary | ICD-10-CM

## 2018-05-17 DIAGNOSIS — Z905 Acquired absence of kidney: Secondary | ICD-10-CM | POA: Diagnosis not present

## 2018-05-17 DIAGNOSIS — R21 Rash and other nonspecific skin eruption: Secondary | ICD-10-CM | POA: Diagnosis not present

## 2018-05-17 DIAGNOSIS — C7802 Secondary malignant neoplasm of left lung: Secondary | ICD-10-CM | POA: Diagnosis not present

## 2018-05-17 DIAGNOSIS — R918 Other nonspecific abnormal finding of lung field: Secondary | ICD-10-CM | POA: Diagnosis not present

## 2018-05-17 DIAGNOSIS — C642 Malignant neoplasm of left kidney, except renal pelvis: Secondary | ICD-10-CM | POA: Diagnosis not present

## 2018-05-17 DIAGNOSIS — L299 Pruritus, unspecified: Secondary | ICD-10-CM | POA: Diagnosis not present

## 2018-05-17 DIAGNOSIS — C641 Malignant neoplasm of right kidney, except renal pelvis: Secondary | ICD-10-CM | POA: Diagnosis not present

## 2018-05-29 ENCOUNTER — Other Ambulatory Visit: Payer: Self-pay | Admitting: Cardiovascular Disease

## 2018-05-29 DIAGNOSIS — E785 Hyperlipidemia, unspecified: Secondary | ICD-10-CM

## 2018-05-29 DIAGNOSIS — I25708 Atherosclerosis of coronary artery bypass graft(s), unspecified, with other forms of angina pectoris: Secondary | ICD-10-CM

## 2018-06-21 DIAGNOSIS — C642 Malignant neoplasm of left kidney, except renal pelvis: Secondary | ICD-10-CM | POA: Diagnosis not present

## 2018-06-21 DIAGNOSIS — Z5111 Encounter for antineoplastic chemotherapy: Secondary | ICD-10-CM | POA: Diagnosis not present

## 2018-07-19 DIAGNOSIS — Z951 Presence of aortocoronary bypass graft: Secondary | ICD-10-CM | POA: Diagnosis not present

## 2018-07-19 DIAGNOSIS — I2510 Atherosclerotic heart disease of native coronary artery without angina pectoris: Secondary | ICD-10-CM | POA: Diagnosis not present

## 2018-07-19 DIAGNOSIS — R21 Rash and other nonspecific skin eruption: Secondary | ICD-10-CM | POA: Diagnosis not present

## 2018-07-19 DIAGNOSIS — C7802 Secondary malignant neoplasm of left lung: Secondary | ICD-10-CM | POA: Diagnosis not present

## 2018-07-19 DIAGNOSIS — Z905 Acquired absence of kidney: Secondary | ICD-10-CM | POA: Diagnosis not present

## 2018-07-19 DIAGNOSIS — E119 Type 2 diabetes mellitus without complications: Secondary | ICD-10-CM | POA: Diagnosis not present

## 2018-07-19 DIAGNOSIS — C642 Malignant neoplasm of left kidney, except renal pelvis: Secondary | ICD-10-CM | POA: Diagnosis not present

## 2018-07-19 DIAGNOSIS — M542 Cervicalgia: Secondary | ICD-10-CM | POA: Diagnosis not present

## 2018-08-08 ENCOUNTER — Ambulatory Visit: Payer: Medicare Other | Admitting: Cardiovascular Disease

## 2018-08-08 ENCOUNTER — Encounter: Payer: Self-pay | Admitting: Cardiovascular Disease

## 2018-08-08 VITALS — BP 108/62 | HR 67 | Ht 68.0 in | Wt 211.8 lb

## 2018-08-08 DIAGNOSIS — I25119 Atherosclerotic heart disease of native coronary artery with unspecified angina pectoris: Secondary | ICD-10-CM | POA: Diagnosis not present

## 2018-08-08 DIAGNOSIS — E782 Mixed hyperlipidemia: Secondary | ICD-10-CM | POA: Diagnosis not present

## 2018-08-08 DIAGNOSIS — R0602 Shortness of breath: Secondary | ICD-10-CM | POA: Diagnosis not present

## 2018-08-08 NOTE — Progress Notes (Signed)
Cardiology Office Note:    Date:  08/08/2018   ID:  Sheri Hernandez, DOB 07-Jul-1956, MRN [id number]  PCP:  Susy Frizzle, MD  Cardiologist:  Sherren Mocha, MD  Electrophysiologist:  None   Referring MD: Susy Frizzle, MD   Chief Complaint  Patient presents with  . Shortness of Breath    History of Present Illness:    Sheri Hernandez is a 62 y.o. female with a hx of CAD and chronic angina.   She presented with non-STEMI in 2016 and underwent complex PCI of severe stenosis in the right coronary artery complicated by extensive dissection and acute inferior STEMI.  She required emergency CABG with a saphenous vein graft to the right PDA and PLA.  She had recurrent angina with cardiac catheterization demonstrating occlusion of her bypass graft.  Her RCA remained patent with residual tight stenosis of the RCA with medical therapy recommended.  She is here alone today for follow-up evaluation. Since I've seen her last, she traveled through [place] and had a great trip. She didn't have any significant issues with her heart while she was there - no NTG use. She did have some fatigue and had to take it easy and rest for a few days in the middle of her trip.  She admits to more shortness of breath recently.  She also complains of increasing edema and episodes of orthopnea.  Feels like her leg swelling has been worse because of dietary sodium indiscretion.  She is not able to wear compression stockings.  Past Medical History:  Diagnosis Date  . Anxiety   . Arthritis    "maybe in my right hand" (05/26/2017)  . CAD (coronary artery disease)    a. 12/2014 Cath: LM nl, LAD 20p, 34m, D2 20ost, LCX 95p, 48m, RCA dominant, 95p, 67m/d, RPDA 95, RPL2 50, attempted RCA PCI w/ acute RCA occlusion-->CABG x 2 VG->RPDA->PLVB.  Marland Kitchen Chronic diastolic CHF (congestive heart failure) ([place])   . Hypertension   . Hypoalbuminemia   . Hypothyroidism   . Lower extremity edema   . Lung mass    LUNG  MASSES--COUGH-BIOPSY NON-DIAGNOSTIC.  HX OF MEDIASTINAL MASS AND LUNG MASSES AGE 67 -NEGATIVE BIOSPY-BUT GIVEN DIAGNOSIS OF SARCOIDOSIS.  WORK UP CONTINUING ON DIAGNOSIS FOR PT'S LUNG MASSES--SHE HAS BIOPSY PROVEN KIDNEY CANCER  . Myocardial infarction ([place]) 12/2014 X 2  . 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  . Papilloma of breast 2012   left. lumpectomy  . Pulmonary nodules   . Renal cell cancer [facility])    metastatic renal cell carcinoma (biopsy of left renal mass 10/2012) On nivolumab at [facility]  . S/p nephrectomy    left, due to metastatic RCC, 3/14    Past Surgical History:  Procedure Laterality Date  . BREAST BIOPSY  08/18/2011   Procedure: BREAST BIOPSY WITH NEEDLE LOCALIZATION;  Surgeon: Merrie Roof, MD;  Location: [facility];  Service: General;  Laterality: Left;  left breast biopsy with needle localization  . BRONCHIAL BRUSH BIOPSY  2014  . CARDIAC CATHETERIZATION    . CORONARY ARTERY BYPASS GRAFT N/A 01/02/2015   Procedure: CORONARY ARTERY BYPASS GRAFTING (CABG)TIMES TWO USING RIGHT GREATER SAPHENOUS LEG VEIN HARVESTED ENDOSCOPICALLY;  Surgeon: [name], MD;  Location: [facility]  OR;  Service: Open Heart Surgery;  Laterality: N/A;  . LAPAROSCOPIC NEPHRECTOMY Left 11/22/2012   Procedure: LAPAROSCOPIC NEPHRECTOMY;  Surgeon: Dutch Gray, MD;  Location: [facility] ORS;  Service: Urology;  Laterality: Left;  . LEFT HEART CATH AND CORS/GRAFTS ANGIOGRAPHY N/A 10/11/2016   Procedure: Left Heart Cath and Cors/Grafts Angiography;  Surgeon: Sherren Mocha, MD;  Location: [facility] CV LAB;  Service: Cardiovascular;  Laterality: N/A;  . LEFT HEART CATHETERIZATION WITH CORONARY ANGIOGRAM N/A 01/02/2015   Procedure: LEFT HEART CATHETERIZATION WITH CORONARY ANGIOGRAM;  Surgeon: Leonie Man, MD;  Location:  [facility] CATH LAB;  Service: Cardiovascular;  Laterality: N/A;  . NECK LESION BIOPSY Left 08/2016  . PORTA CATH INSERTION Right ~ 2017  . TONSILLECTOMY  1967  . VIDEO BRONCHOSCOPY  10/11/2012   Procedure: VIDEO BRONCHOSCOPY WITH FLUORO;  Surgeon: Kathee Delton, MD;  Location: [facility] ENDOSCOPY;  Service: Cardiopulmonary;  Laterality: Bilateral;  . WIDS    . WISDOM TOOTH EXTRACTION     "all 4"    Current Medications: Current Meds  Medication Sig  . Acetaminophen (TYLENOL PO) Take 2 tablets by mouth every 6 (six) hours as needed (headache).  Marland Kitchen aspirin 81 MG EC tablet Take 1 tablet (81 mg total) by mouth daily.  Marland Kitchen atorvastatin (LIPITOR) 40 MG tablet TAKE 1 TABLET (40 MG TOTAL) BY MOUTH DAILY.  . benzonatate (TESSALON) 100 MG capsule Take 1 capsule (100 mg total) by mouth 3 (three) times daily as needed for cough.  . Cholecalciferol (VITAMIN D PO) Take 1 tablet by mouth daily.  . clopidogrel (PLAVIX) 75 MG tablet Take 1 tablet (75 mg total) by mouth daily.  . famotidine (PEPCID) 20 MG tablet Take 1 tablet (20 mg total) by mouth daily.  . furosemide (LASIX) 40 MG tablet Take 1 tablet (40 mg total) by mouth daily.  Marland Kitchen HYDROcodone-acetaminophen (NORCO/VICODIN) 5-325 MG tablet Take 1 tablet by mouth every 4 (four) hours as needed for moderate pain.  . isosorbide mononitrate (IMDUR) 60 MG 24 hr tablet Take 1 tablet (60 mg total) by mouth daily.  Marland Kitchen levothyroxine (SYNTHROID, LEVOTHROID) 137 MCG tablet TAKE 1 TABLET (137 MCG TOTAL) BY MOUTH DAILY BEFORE BREAKFAST.  Marland Kitchen lidocaine-prilocaine (EMLA) cream Apply 1 application topically See admin instructions. Apply cream to portacath site approximately 1 hour before access and cover  . loratadine (CLARITIN) 10 MG tablet Take 1 tablet (10 mg total) by mouth daily.  . metoprolol tartrate (LOPRESSOR) 50 MG tablet Take 1.5 tablets (75 mg total) by mouth 2 (two) times daily.  . nitroGLYCERIN (NITROSTAT) 0.4 MG SL tablet PLACE 1 TABLET (0.4 MG TOTAL) UNDER THE TONGUE EVERY 5  (FIVE) MINUTES AS NEEDED FOR CHEST PAIN.  Marland Kitchen oxycodone (OXY-IR) 5 MG capsule Take 5 mg by mouth daily as needed (severe pain). pain  . triamcinolone cream (KENALOG) 0.1 % Apply 1 application topically 2 (two) times daily.     Allergies:   Silver   Social History   Socioeconomic History  . Marital status: Married    Spouse name: Not on file  . Number of children: 0  . Years of education: Not on file  . Highest education level: Not on file  Occupational History  . 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   Social Needs  . Financial resource strain: Not on file  . Food insecurity:    Worry: Not on file    Inability: Not on file  . Transportation needs:    Medical: Not on  file    Non-medical: Not on file  Tobacco Use  . Smoking status: Former Smoker    Packs/day: 1.50    Years: 40.00    Pack years: 60.00    Types: Cigarettes    Last attempt to quit: 09/24/2012    Years since quitting: 5.8  . Smokeless tobacco: Never Used  Substance and Sexual Activity  . Alcohol use: Yes    Comment: 05/26/2017 "couple drinks/year"  . Drug use: No  . Sexual activity: Yes  Lifestyle  . Physical activity:    Days per week: Not on file    Minutes per session: Not on file  . Stress: Not on file  Relationships  . Social connections:    Talks on phone: Not on file    Gets together: Not on file    Attends religious service: Not on file    Active member of club or organization: Not on file    Attends meetings of clubs or organizations: Not on file    Relationship status: Not on file  Other Topics Concern  . Not on file  Social History Narrative  . Not on file     Family History: The patient's family history includes Aortic dissection in her brother; Breast cancer in her maternal aunt; Cancer in her maternal grandfather; Colon cancer in her maternal aunt; Heart attack in her father; Heart disease in her brother, father, maternal grandmother, and mother; Skin cancer in  her maternal uncle; Stomach cancer in her maternal uncle; Stroke in her brother.  ROS:   Please see the history of present illness.    All other systems reviewed and are negative.  EKGs/Labs/Other Studies Reviewed:    EKG:  EKG is not ordered today.    Recent Labs: 03/07/2018: BUN 14; Creatinine, Ser 1.02; Hemoglobin 14.7; Platelets 343; Potassium 3.7; Sodium 142  Recent Lipid Panel    Component Value Date/Time   CHOL 90 01/03/2015 0210   TRIG 77 01/03/2015 0210   HDL 15 (L) 01/03/2015 0210   CHOLHDL 6.0 01/03/2015 0210   VLDL 15 01/03/2015 0210   LDLCALC 60 01/03/2015 0210    Physical Exam:    VS:  BP 108/62   Pulse 67   Ht 5\' 8"  (1.727 m)   Wt 211 lb 12.8 oz (96.1 kg)   SpO2 92%   BMI 32.20 kg/m     Wt Readings from Last 3 Encounters:  08/08/18 211 lb 12.8 oz (96.1 kg)  04/23/18 204 lb 12.8 oz (92.9 kg)  03/07/18 204 lb (92.5 kg)     GEN:  Well nourished, well developed in no acute distress HEENT: Normal NECK: No JVD; No carotid bruits LYMPHATICS: No lymphadenopathy CARDIAC: RRR, no murmurs, rubs, gallops RESPIRATORY:  Clear to auscultation without rales, wheezing or rhonchi  ABDOMEN: Soft, non-tender, non-distended MUSCULOSKELETAL:  2+ bilateral pretibial edema; No deformity  SKIN: Warm and dry NEUROLOGIC:  Alert and oriented x 3 PSYCHIATRIC:  Normal affect   ASSESSMENT:    1. Coronary artery disease involving native coronary artery of native heart with angina pectoris ([place])   2. Mixed hyperlipidemia   3. SOB (shortness of breath)    PLAN:    In order of problems listed above:  1. The patient's anginal symptoms are stable.  She continues on aspirin and clopidogrel.  She is tolerating isosorbide and metoprolol.  No changes are recommended today. 2. Most recent lipids are reviewed.  She continues on atorvastatin 40 mg daily.  Lifestyle modification discussed today.  3. The patient does have increasing shortness of breath.  I suspect this is multifactorial.   An echo from 18 months ago is reviewed and demonstrated vigorous LV systolic function with only mild diastolic dysfunction.  I am going to repeat an echo considering her worsening edema.  I talked to her about taking furosemide on a more regular basis.  She is been skipping it when she has to go out which is been frequent of late.  We discussed sodium restriction.    Medication Adjustments/Labs and Tests Ordered: Current medicines are reviewed at length with the patient today.  Concerns regarding medicines are outlined above.  Orders Placed This Encounter  Procedures  . ECHOCARDIOGRAM COMPLETE   No orders of the defined types were placed in this encounter.   Patient Instructions  Medication Instructions:  No change If you need a refill on your cardiac medications before your next appointment, please call your pharmacy.   Lab work: none If you have labs (blood work) drawn today and your tests are completely normal, you will receive your results only by: Marland Kitchen MyChart Message (if you have MyChart) OR . A paper copy in the mail If you have any lab test that is abnormal or we need to change your treatment, we will call you to review the results.  Testing/Procedures: Your physician has requested that you have an echocardiogram. Echocardiography is a painless test that uses sound waves to create images of your heart. It provides your doctor with information about the size and shape of your heart and how well your heart's chambers and valves are working. This procedure takes approximately one hour. There are no restrictions for this procedure.    Follow-Up: At [facility], you and your health needs are our priority.  As part of our continuing mission to provide you with exceptional heart care, we have created designated Provider Care Teams.  These Care Teams include your primary Cardiologist (physician) and Advanced Practice Providers (APPs -  Physician Assistants and Nurse Practitioners) who  all work together to provide you with the care you need, when you need it. You will need a follow up appointment in:  6 months.  Please call our office 2 months in advance to schedule this appointment.  You may see Sherren Mocha, MD or one of the following Advanced Practice Providers on your designated Care Team: Richardson Dopp, PA-C [place], [place] . Daune Perch, NP  Any Other Special Instructions Will Be Listed Below (If Applicable).       Signed, Sherren Mocha, MD  08/08/2018 1:15 PM    [facility]

## 2018-08-08 NOTE — Patient Instructions (Signed)
Medication Instructions:  No change If you need a refill on your cardiac medications before your next appointment, please call your pharmacy.   Lab work: none If you have labs (blood work) drawn today and your tests are completely normal, you will receive your results only by: Marland Kitchen MyChart Message (if you have MyChart) OR . A paper copy in the mail If you have any lab test that is abnormal or we need to change your treatment, we will call you to review the results.  Testing/Procedures: Your physician has requested that you have an echocardiogram. Echocardiography is a painless test that uses sound waves to create images of your heart. It provides your doctor with information about the size and shape of your heart and how well your heart's chambers and valves are working. This procedure takes approximately one hour. There are no restrictions for this procedure.    Follow-Up: At [facility], you and your health needs are our priority.  As part of our continuing mission to provide you with exceptional heart care, we have created designated Provider Care Teams.  These Care Teams include your primary Cardiologist (physician) and Advanced Practice Providers (APPs -  Physician Assistants and Nurse Practitioners) who all work together to provide you with the care you need, when you need it. You will need a follow up appointment in:  6 months.  Please call our office 2 months in advance to schedule this appointment.  You may see Sherren Mocha, MD or one of the following Advanced Practice Providers on your designated Care Team: Richardson Dopp, PA-C [place], [place] . Daune Perch, NP  Any Other Special Instructions Will Be Listed Below (If Applicable).

## 2018-08-10 ENCOUNTER — Other Ambulatory Visit: Payer: Self-pay | Admitting: Family Medicine

## 2018-08-16 DIAGNOSIS — C7802 Secondary malignant neoplasm of left lung: Secondary | ICD-10-CM | POA: Diagnosis not present

## 2018-08-16 DIAGNOSIS — Z905 Acquired absence of kidney: Secondary | ICD-10-CM | POA: Diagnosis not present

## 2018-08-16 DIAGNOSIS — R0609 Other forms of dyspnea: Secondary | ICD-10-CM | POA: Diagnosis not present

## 2018-08-16 DIAGNOSIS — C642 Malignant neoplasm of left kidney, except renal pelvis: Secondary | ICD-10-CM | POA: Diagnosis not present

## 2018-08-16 DIAGNOSIS — Z951 Presence of aortocoronary bypass graft: Secondary | ICD-10-CM | POA: Diagnosis not present

## 2018-08-16 DIAGNOSIS — I2510 Atherosclerotic heart disease of native coronary artery without angina pectoris: Secondary | ICD-10-CM | POA: Diagnosis not present

## 2018-08-16 DIAGNOSIS — R202 Paresthesia of skin: Secondary | ICD-10-CM | POA: Diagnosis not present

## 2018-08-16 DIAGNOSIS — E119 Type 2 diabetes mellitus without complications: Secondary | ICD-10-CM | POA: Diagnosis not present

## 2018-08-16 DIAGNOSIS — Z79899 Other long term (current) drug therapy: Secondary | ICD-10-CM | POA: Diagnosis not present

## 2018-08-16 DIAGNOSIS — Z5111 Encounter for antineoplastic chemotherapy: Secondary | ICD-10-CM | POA: Diagnosis not present

## 2018-08-16 DIAGNOSIS — R600 Localized edema: Secondary | ICD-10-CM | POA: Diagnosis not present

## 2018-08-16 DIAGNOSIS — L299 Pruritus, unspecified: Secondary | ICD-10-CM | POA: Diagnosis not present

## 2018-08-16 DIAGNOSIS — R252 Cramp and spasm: Secondary | ICD-10-CM | POA: Diagnosis not present

## 2018-08-16 DIAGNOSIS — R21 Rash and other nonspecific skin eruption: Secondary | ICD-10-CM | POA: Diagnosis not present

## 2018-08-16 DIAGNOSIS — R911 Solitary pulmonary nodule: Secondary | ICD-10-CM | POA: Diagnosis not present

## 2018-08-22 ENCOUNTER — Ambulatory Visit (HOSPITAL_COMMUNITY): Payer: Medicare Other | Attending: Cardiology

## 2018-08-22 ENCOUNTER — Other Ambulatory Visit: Payer: Self-pay

## 2018-08-22 DIAGNOSIS — R0602 Shortness of breath: Secondary | ICD-10-CM | POA: Insufficient documentation

## 2018-08-24 ENCOUNTER — Telehealth: Payer: Self-pay | Admitting: *Deleted

## 2018-08-24 NOTE — Telephone Encounter (Signed)
P2Y12 inh/antiplatelet clearance per MD's instructions

## 2018-08-24 NOTE — Telephone Encounter (Signed)
   [facility] Pre-operative Risk Assessment    Request for surgical clearance:  1. What type of surgery is being performed? PERIODONTAL SURGERY AND POSSIBLE EXTRACTIONS  2. When is this surgery scheduled? TBD   3. What type of clearance is required (medical clearance vs. Pharmacy clearance to hold med vs. Both)? BOTH   4. Are there any medications that need to be held prior to surgery and how long?PLAVIX, ASA   5. Practice name and name of physician performing surgery? Jeralene Peters, D.D.S, M.S., P.A.   6. What is your office phone number [phone]    7.   What is your office fax number [phone]  8.   Anesthesia type (None, local, MAC, general) ? GENERAL, POSSIBLE LIDOCAINE AS WELL.    Sheri Hernandez 08/24/2018, 10:37 AM  _________________________________________________________________   (provider comments below)

## 2018-08-27 NOTE — Telephone Encounter (Signed)
Ok to hold plavix x 5 days. Best to stay on ASA without interruption if possible. thx

## 2018-08-27 NOTE — Telephone Encounter (Signed)
I called to speak with pt regarding any new symptoms since last seen and inform her of recommendations for plavis and aspirin. She should be fine for dental work if nothing new. Recent echo showed normal LV function.   Left VM for pt to call back.

## 2018-08-30 NOTE — Telephone Encounter (Signed)
Called Dentist office regarding the question on the Aspirin. Advised that the patient could hold the Plavix, but would recommend not to hold Aspirin. I gave our call back number to be reached to clarify.

## 2018-08-30 NOTE — Telephone Encounter (Signed)
   Primary Cardiologist: Sherren Mocha, MD  Chart reviewed as part of pre-operative protocol coverage. Patient was contacted 08/30/2018 in reference to pre-operative risk assessment for pending surgery as outlined below.  Sheri Hernandez was last seen on 08/08/2018 by Dr. Burt Knack.  Since that day, Sheri Hernandez has done well.   Therefore, based on ACC/AHA guidelines, the patient would be at acceptable risk for the planned procedure without further cardiovascular testing.   I will route this recommendation to the requesting party via Epic fax function and remove from pre-op pool.  Please call with questions.  Dr Burt Knack has cleared her to hold plavix for 5 days and restart after the surgery as soon as possible. I have informed the patient as well. Ideally we would prefer her to proceed with surgery on the aspirin given her cardiac history. I tried to call the office, however was unable to reach the staff, we will continue attempt to reach out to Dr. Saul Fordyce office to see if aspirin absolutely must be held prior to surgery.  [place], [place] 08/30/2018, 3:55 PM

## 2018-09-04 NOTE — Telephone Encounter (Signed)
Spoke with Reception at Dr Roosvelt Harps office and they stated they received our message and from that was faxed over. Thanks me for my follow up call.

## 2018-09-13 DIAGNOSIS — E119 Type 2 diabetes mellitus without complications: Secondary | ICD-10-CM | POA: Diagnosis not present

## 2018-09-13 DIAGNOSIS — C649 Malignant neoplasm of unspecified kidney, except renal pelvis: Secondary | ICD-10-CM | POA: Diagnosis not present

## 2018-09-13 DIAGNOSIS — C642 Malignant neoplasm of left kidney, except renal pelvis: Secondary | ICD-10-CM | POA: Diagnosis not present

## 2018-09-13 DIAGNOSIS — R0602 Shortness of breath: Secondary | ICD-10-CM | POA: Diagnosis not present

## 2018-09-13 DIAGNOSIS — Z5111 Encounter for antineoplastic chemotherapy: Secondary | ICD-10-CM | POA: Diagnosis not present

## 2018-09-13 DIAGNOSIS — R202 Paresthesia of skin: Secondary | ICD-10-CM | POA: Diagnosis not present

## 2018-10-03 ENCOUNTER — Telehealth: Payer: Self-pay | Admitting: *Deleted

## 2018-10-03 NOTE — Telephone Encounter (Signed)
   [facility] Pre-operative Risk Assessment    Request for surgical clearance:  1. What type of surgery is being performed? PERIODONTAL SURGERY OR EXTRACTIONS   2. When is this surgery scheduled? TBD   3. What type of clearance is required (medical clearance vs. Pharmacy clearance to hold med vs. Both)? MEDICAL  4. Are there any medications that need to be held prior to surgery and how long?PLAVIX  AND ASA   5. Practice name and name of physician performing surgery? Jeralene Peters, D.D.S   6. What is your office phone number [phone]    7.   What is your office fax number [phone]  8.   Anesthesia type (None, local, MAC, general) ? LEFT MESSAGE FOR DENTAL OFFICE TO VERIFY IF ANESTHESIA    Julaine Hua 10/03/2018, 4:32 PM  _________________________________________________________________   (provider comments below)

## 2018-10-04 NOTE — Telephone Encounter (Signed)
I was able to reach dental office. I confirmed with dental office they are asking for the ASA to be held as well as the Plavix . They stated they are leaving the amount of time off meds up to Cardiologist. They anesthesia she said will be local with possible nitrous oxide.

## 2018-10-10 ENCOUNTER — Encounter: Payer: Self-pay | Admitting: Cardiology

## 2018-10-10 NOTE — Telephone Encounter (Signed)
Yes this is ok. thanks

## 2018-10-10 NOTE — Telephone Encounter (Signed)
   Primary [place], MD  Chart reviewed as part of pre-operative protocol coverage. Because of Suzi Hernan Dolce's past medical history and time since last visit, he/she will require a follow-up visit in order to better assess preoperative cardiovascular risk.  I talked to patient and she could not explain what procedure she is having and she could not tell me if she had chest pain or SOB or if any worse.   She became frustrated and hung up.   She said she was not comfortable with this but would not explain what this was.   I think she should come in for appt.     Pre-op covering staff: - Please schedule appointment and call patient to inform them. - Please contact requesting surgeon's office via preferred method (i.e, phone, fax) to inform them of need for appointment prior to surgery.  If applicable, this message will also be routed to pharmacy pool and/or primary cardiologist for input on holding anticoagulant/antiplatelet agent as requested below so that this information is available at time of patient's appointment.   [name], NP  10/10/2018, 5:10 PM

## 2018-10-10 NOTE — Telephone Encounter (Signed)
Dr. Burt Knack is it ok to hold asa and plavix for dental surgery?   Please send results to pre-op pool

## 2018-10-11 NOTE — Telephone Encounter (Signed)
I have asked another provider to try and call the pt  To see if we could make sure stable.  She had last appt 08/08/18.

## 2018-10-11 NOTE — Telephone Encounter (Signed)
   Primary Cardiologist: Sherren Mocha, MD  Chart reviewed as part of pre-operative protocol coverage. Patient was contacted 10/11/2018 in reference to pre-operative risk assessment for pending surgery as outlined below.  Sheri Hernandez was last seen on 08/08/2018 by Dr. Burt Knack.  Since that day, Sheri Hernandez has done well.  She has chronic stable angina but no new changes in her symptoms.  Therefore, based on ACC/AHA guidelines, the patient would be at acceptable risk for the planned procedure without further cardiovascular testing.   Per Dr. Burt Knack she can hold her Plavix for 5 days prior to dental procedure.  She should continue aspirin if possible.  I will route this recommendation to the requesting party via Epic fax function and remove from pre-op pool.  Please call with questions.  Daune Perch, NP 10/11/2018, 1:19 PM

## 2018-10-12 DIAGNOSIS — C642 Malignant neoplasm of left kidney, except renal pelvis: Secondary | ICD-10-CM | POA: Diagnosis not present

## 2018-10-12 DIAGNOSIS — Z5111 Encounter for antineoplastic chemotherapy: Secondary | ICD-10-CM | POA: Diagnosis not present

## 2018-10-15 ENCOUNTER — Telehealth: Payer: Self-pay

## 2018-10-15 DIAGNOSIS — E785 Hyperlipidemia, unspecified: Secondary | ICD-10-CM

## 2018-10-15 DIAGNOSIS — I25708 Atherosclerosis of coronary artery bypass graft(s), unspecified, with other forms of angina pectoris: Secondary | ICD-10-CM

## 2018-10-15 MED ORDER — NITROGLYCERIN 0.4 MG SL SUBL: 0.4000 mg | SUBLINGUAL_TABLET | SUBLINGUAL | 6 refills | Status: DC | PRN

## 2018-10-15 NOTE — Telephone Encounter (Signed)
The patient's husband was in the office today for follow-up with Dr. Tamala Julian. He asked for a refill on his NTG and said he never takes it, but his wife needs it. He states she is taking about 2-3 tablets weekly.   He states he doesn't think she is taking the medication "more than usual." He has no idea what her BP is but says she does stand up and get dizzy at times.   NTG refill called in for Mrs. Bogart. Informed Mr. Stiner I will call and touch base with Mrs. Solecki later this week.  To Dr. Burt Knack for recommendations.

## 2018-10-16 NOTE — Telephone Encounter (Signed)
This is fine. She takes NTG regularly. thanks

## 2018-10-17 NOTE — Telephone Encounter (Signed)
Informed patient of Dr. Antionette Char comments. She understands to call if she has to take NTG more often. She was grateful for assistance.

## 2018-11-03 ENCOUNTER — Other Ambulatory Visit: Payer: Self-pay | Admitting: Cardiovascular Disease

## 2018-11-08 DIAGNOSIS — Z79899 Other long term (current) drug therapy: Secondary | ICD-10-CM | POA: Diagnosis not present

## 2018-11-08 DIAGNOSIS — C642 Malignant neoplasm of left kidney, except renal pelvis: Secondary | ICD-10-CM | POA: Diagnosis not present

## 2018-11-08 DIAGNOSIS — C649 Malignant neoplasm of unspecified kidney, except renal pelvis: Secondary | ICD-10-CM | POA: Diagnosis not present

## 2018-11-08 DIAGNOSIS — C7802 Secondary malignant neoplasm of left lung: Secondary | ICD-10-CM | POA: Diagnosis not present

## 2018-11-08 DIAGNOSIS — Z5111 Encounter for antineoplastic chemotherapy: Secondary | ICD-10-CM | POA: Diagnosis not present

## 2018-11-08 DIAGNOSIS — C7801 Secondary malignant neoplasm of right lung: Secondary | ICD-10-CM | POA: Diagnosis not present

## 2018-11-08 DIAGNOSIS — R51 Headache: Secondary | ICD-10-CM | POA: Diagnosis not present

## 2018-11-08 DIAGNOSIS — C772 Secondary and unspecified malignant neoplasm of intra-abdominal lymph nodes: Secondary | ICD-10-CM | POA: Diagnosis not present

## 2018-11-19 ENCOUNTER — Other Ambulatory Visit: Payer: Self-pay | Admitting: Cardiovascular Disease

## 2018-12-06 DIAGNOSIS — C412 Malignant neoplasm of vertebral column: Secondary | ICD-10-CM | POA: Diagnosis not present

## 2018-12-06 DIAGNOSIS — Z5112 Encounter for antineoplastic immunotherapy: Secondary | ICD-10-CM | POA: Diagnosis not present

## 2018-12-24 ENCOUNTER — Other Ambulatory Visit: Payer: Self-pay

## 2018-12-24 ENCOUNTER — Ambulatory Visit (INDEPENDENT_AMBULATORY_CARE_PROVIDER_SITE_OTHER): Payer: Medicare Other | Admitting: Family Medicine

## 2018-12-24 ENCOUNTER — Encounter: Payer: Self-pay | Admitting: Family Medicine

## 2018-12-24 VITALS — BP 146/94 | HR 90 | Temp 98.8°F | Resp 16 | Ht 68.0 in | Wt 214.0 lb

## 2018-12-24 DIAGNOSIS — A6920 Lyme disease, unspecified: Secondary | ICD-10-CM

## 2018-12-24 DIAGNOSIS — I25708 Atherosclerosis of coronary artery bypass graft(s), unspecified, with other forms of angina pectoris: Secondary | ICD-10-CM | POA: Diagnosis not present

## 2018-12-24 DIAGNOSIS — L301 Dyshidrosis [pompholyx]: Secondary | ICD-10-CM | POA: Diagnosis not present

## 2018-12-24 MED ORDER — DOXYCYCLINE HYCLATE 100 MG PO TABS: 100.0000 mg | ORAL_TABLET | Freq: Two times a day (BID) | ORAL | 0 refills | Status: DC

## 2018-12-24 MED ORDER — CLOBETASOL PROPIONATE 0.05 % EX CREA: 1.0000 "application " | TOPICAL_CREAM | Freq: Two times a day (BID) | CUTANEOUS | 0 refills | Status: DC

## 2018-12-24 NOTE — Progress Notes (Signed)
Subjective:    Patient ID: Sheri Hernandez, female    DOB: 1955/09/19, 63 y.o.   MRN: [id number]  HPI Patient removed a tick from the medial side of her left bicep.  There is now a spreading red ring that is 8 cm in diameter spreading out circumferentially from the tick bite itself.  It does not hurt.  It is not sore however it is warm to the touch.  It is concerning for erythema migrans.  Tick bit her on Saturday or at least that is when it was found.  Sheri Hernandez is here today for evaluation because the redness just started.  Sheri Hernandez also has a rash on the right hand.  There is a eczema-like rash in the webspace between the thumb and the index finger on the right hand.  There is a similar rash with some small vesicles on the palmar surface of the hyperthenar eminence.  Sheri Hernandez also has some cracked dry fissured skin in these areas.  Sheri Hernandez has been washing her hands frequently due to the coronavirus pandemic.  Sheri Hernandez has a history of dyshidrotic eczema. Past Medical History:  Diagnosis Date  . Anxiety   . Arthritis    "maybe in my right hand" (05/26/2017)  . CAD (coronary artery disease)    a. 12/2014 Cath: LM nl, LAD 20p, 72m, D2 20ost, LCX 95p, 74m, RCA dominant, 95p, 43m/d, RPDA 95, RPL2 50, attempted RCA PCI w/ acute RCA occlusion-->CABG x 2 VG->RPDA->PLVB.  Marland Kitchen Chronic diastolic CHF (congestive heart failure) ([place])   . Hypertension   . Hypoalbuminemia   . Hypothyroidism   . Lower extremity edema   . Lung mass    LUNG MASSES--COUGH-BIOPSY NON-DIAGNOSTIC.  HX OF MEDIASTINAL MASS AND LUNG MASSES AGE 62 -NEGATIVE BIOSPY-BUT GIVEN DIAGNOSIS OF SARCOIDOSIS.  WORK UP CONTINUING ON DIAGNOSIS FOR PT'S LUNG MASSES--Sheri Hernandez HAS BIOPSY PROVEN KIDNEY CANCER  . Myocardial infarction ([place]) 12/2014 X 2  . 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  . Papilloma of breast 2012   left. lumpectomy  . Pulmonary nodules   . Renal cell cancer [facility])    metastatic renal cell carcinoma (biopsy of left renal mass 10/2012) On nivolumab at [facility]  . S/p nephrectomy    left, due to metastatic RCC, 3/14   Past Surgical History:  Procedure Laterality Date  . BREAST BIOPSY  08/18/2011   Procedure: BREAST BIOPSY WITH NEEDLE LOCALIZATION;  Surgeon: Merrie Roof, MD;  Location: [place];  Service: General;  Laterality: Left;  left breast biopsy with needle localization  . BRONCHIAL BRUSH BIOPSY  2014  . CARDIAC CATHETERIZATION    . CORONARY ARTERY BYPASS GRAFT N/A 01/02/2015   Procedure: CORONARY ARTERY BYPASS GRAFTING (CABG)TIMES TWO USING RIGHT GREATER SAPHENOUS LEG VEIN HARVESTED ENDOSCOPICALLY;  Surgeon: [name], MD;  Location: [place];  Service: Open Heart Surgery;  Laterality: N/A;  . LAPAROSCOPIC NEPHRECTOMY Left 11/22/2012   Procedure: LAPAROSCOPIC NEPHRECTOMY;  Surgeon: Dutch Gray, MD;  Location: [facility];  Service: Urology;  Laterality: Left;  . LEFT HEART CATH AND CORS/GRAFTS ANGIOGRAPHY N/A 10/11/2016   Procedure: Left Heart Cath and Cors/Grafts Angiography;  Surgeon: Sherren Mocha, MD;  Location: [place] CV LAB;  Service: Cardiovascular;  Laterality: N/A;  . LEFT HEART CATHETERIZATION  WITH CORONARY ANGIOGRAM N/A 01/02/2015   Procedure: LEFT HEART CATHETERIZATION WITH CORONARY ANGIOGRAM;  Surgeon: Leonie Man, MD;  Location: [facility] CATH LAB;  Service: Cardiovascular;  Laterality: N/A;  . NECK LESION BIOPSY Left 08/2016  . PORTA CATH INSERTION Right ~ 2017  . TONSILLECTOMY  1967  . VIDEO BRONCHOSCOPY  10/11/2012   Procedure: VIDEO BRONCHOSCOPY WITH FLUORO;  Surgeon: Kathee Delton, MD;  Location: [facility];  Service: Cardiopulmonary;  Laterality: Bilateral;  . WIDS    . WISDOM TOOTH EXTRACTION     "all 4"   Current Outpatient Medications on File Prior to Visit  Medication Sig Dispense Refill  . Acetaminophen  (TYLENOL PO) Take 2 tablets by mouth every 6 (six) hours as needed (headache).    Marland Kitchen aspirin 81 MG EC tablet Take 1 tablet (81 mg total) by mouth daily.    Marland Kitchen atorvastatin (LIPITOR) 40 MG tablet TAKE 1 TABLET (40 MG TOTAL) BY MOUTH DAILY. 90 tablet 3  . Cholecalciferol (VITAMIN D PO) Take 1 tablet by mouth daily.    . clopidogrel (PLAVIX) 75 MG tablet TAKE 1 TABLET BY MOUTH EVERY DAY 90 tablet 2  . famotidine (PEPCID) 20 MG tablet Take 1 tablet (20 mg total) by mouth daily. 30 tablet 2  . isosorbide mononitrate (IMDUR) 60 MG 24 hr tablet Take 1 tablet (60 mg total) by mouth daily. 90 tablet 3  . levothyroxine (SYNTHROID, LEVOTHROID) 137 MCG tablet TAKE 1 TABLET (137 MCG TOTAL) BY MOUTH DAILY BEFORE BREAKFAST. 30 tablet 0  . metoprolol tartrate (LOPRESSOR) 50 MG tablet Take 1.5 tablets (75 mg total) by mouth 2 (two) times daily. Pt needs to make appt for further refills 270 tablet 0  . nitroGLYCERIN (NITROSTAT) 0.4 MG SL tablet Place 1 tablet (0.4 mg total) under the tongue every 5 (five) minutes as needed for chest pain. 25 tablet 6  . oxycodone (OXY-IR) 5 MG capsule Take 5 mg by mouth daily as needed (severe pain). pain    . furosemide (LASIX) 40 MG tablet Take 1 tablet (40 mg total) by mouth daily. 90 tablet 3   No current facility-administered medications on file prior to visit.    Allergies  Allergen Reactions  . Silver Dermatitis    Please use mepalex   Social History   Socioeconomic History  . Marital status: Married    Spouse name: Not on file  . Number of children: 0  . Years of education: Not on file  . Highest education level: Not on file  Occupational History  . 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   Social Needs  . Financial resource strain: Not on file  . Food insecurity:    Worry: Not on file    Inability: Not on file  . Transportation needs:    Medical: Not on file    Non-medical: Not on file  Tobacco Use  . Smoking status: Former Smoker     Packs/day: 1.50    Years: 40.00    Pack years: 60.00    Types: Cigarettes    Last attempt to quit: 09/24/2012    Years since quitting: 6.2  . Smokeless tobacco: Never Used  Substance and Sexual Activity  . Alcohol use: Yes    Comment: 05/26/2017 "couple drinks/year"  . Drug use: No  . Sexual activity: Yes  Lifestyle  . Physical activity:    Days per week: Not on file    Minutes per session: Not on file  .  Stress: Not on file  Relationships  . Social connections:    Talks on phone: Not on file    Gets together: Not on file    Attends religious service: Not on file    Active member of club or organization: Not on file    Attends meetings of clubs or organizations: Not on file    Relationship status: Not on file  . Intimate partner violence:    Fear of current or ex partner: Not on file    Emotionally abused: Not on file    Physically abused: Not on file    Forced sexual activity: Not on file  Other Topics Concern  . Not on file  Social History Narrative  . Not on file      Review of Systems  All other systems reviewed and are negative.      Objective:   Physical Exam  Constitutional: Sheri Hernandez appears well-developed and well-nourished.  HENT:  Right Ear: External ear normal.  Left Ear: External ear normal.  Nose: Nose normal.  Mouth/Throat: Oropharynx is clear and moist. No oropharyngeal exudate.  Neck: Neck supple.  Cardiovascular: Normal rate, regular rhythm, normal heart sounds and intact distal pulses.  No murmur heard. Pulmonary/Chest: Effort normal and breath sounds normal. No respiratory distress. Sheri Hernandez has no wheezes. Sheri Hernandez has no rales.  Musculoskeletal:     Left upper arm: Sheri Hernandez exhibits deformity. Sheri Hernandez exhibits no tenderness, no bony tenderness and no swelling.       Arms:  Lymphadenopathy:    Sheri Hernandez has no cervical adenopathy.  Skin: Rash noted. Rash is maculopapular.     Vitals reviewed.         Assessment & Plan:  Erythema migrans (Lyme disease)   Dyshidrotic eczema  Patient has erythema migrans.  I will treat this with doxycycline 100 mg p.o. twice daily for 10 days.  Sheri Hernandez also appears to have dyshidrotic eczema on the right greater than left hand.  I will treat this with clobetasol cream twice daily for 1 week.

## 2019-01-03 DIAGNOSIS — Z5111 Encounter for antineoplastic chemotherapy: Secondary | ICD-10-CM | POA: Diagnosis not present

## 2019-01-03 DIAGNOSIS — C649 Malignant neoplasm of unspecified kidney, except renal pelvis: Secondary | ICD-10-CM | POA: Diagnosis not present

## 2019-01-03 DIAGNOSIS — E119 Type 2 diabetes mellitus without complications: Secondary | ICD-10-CM | POA: Diagnosis not present

## 2019-01-03 DIAGNOSIS — Z79899 Other long term (current) drug therapy: Secondary | ICD-10-CM | POA: Diagnosis not present

## 2019-01-03 DIAGNOSIS — C652 Malignant neoplasm of left renal pelvis: Secondary | ICD-10-CM | POA: Diagnosis not present

## 2019-01-03 DIAGNOSIS — M542 Cervicalgia: Secondary | ICD-10-CM | POA: Diagnosis not present

## 2019-01-20 IMAGING — DX DG CHEST 2V
2 series · 2 of 2 positions shown · non-contrast
Comparison: 05/26/2017

CLINICAL DATA: Chest pain radiating into the arms and neck.
Dizziness.

EXAM:
CHEST - 2 VIEW

[w chest pa]
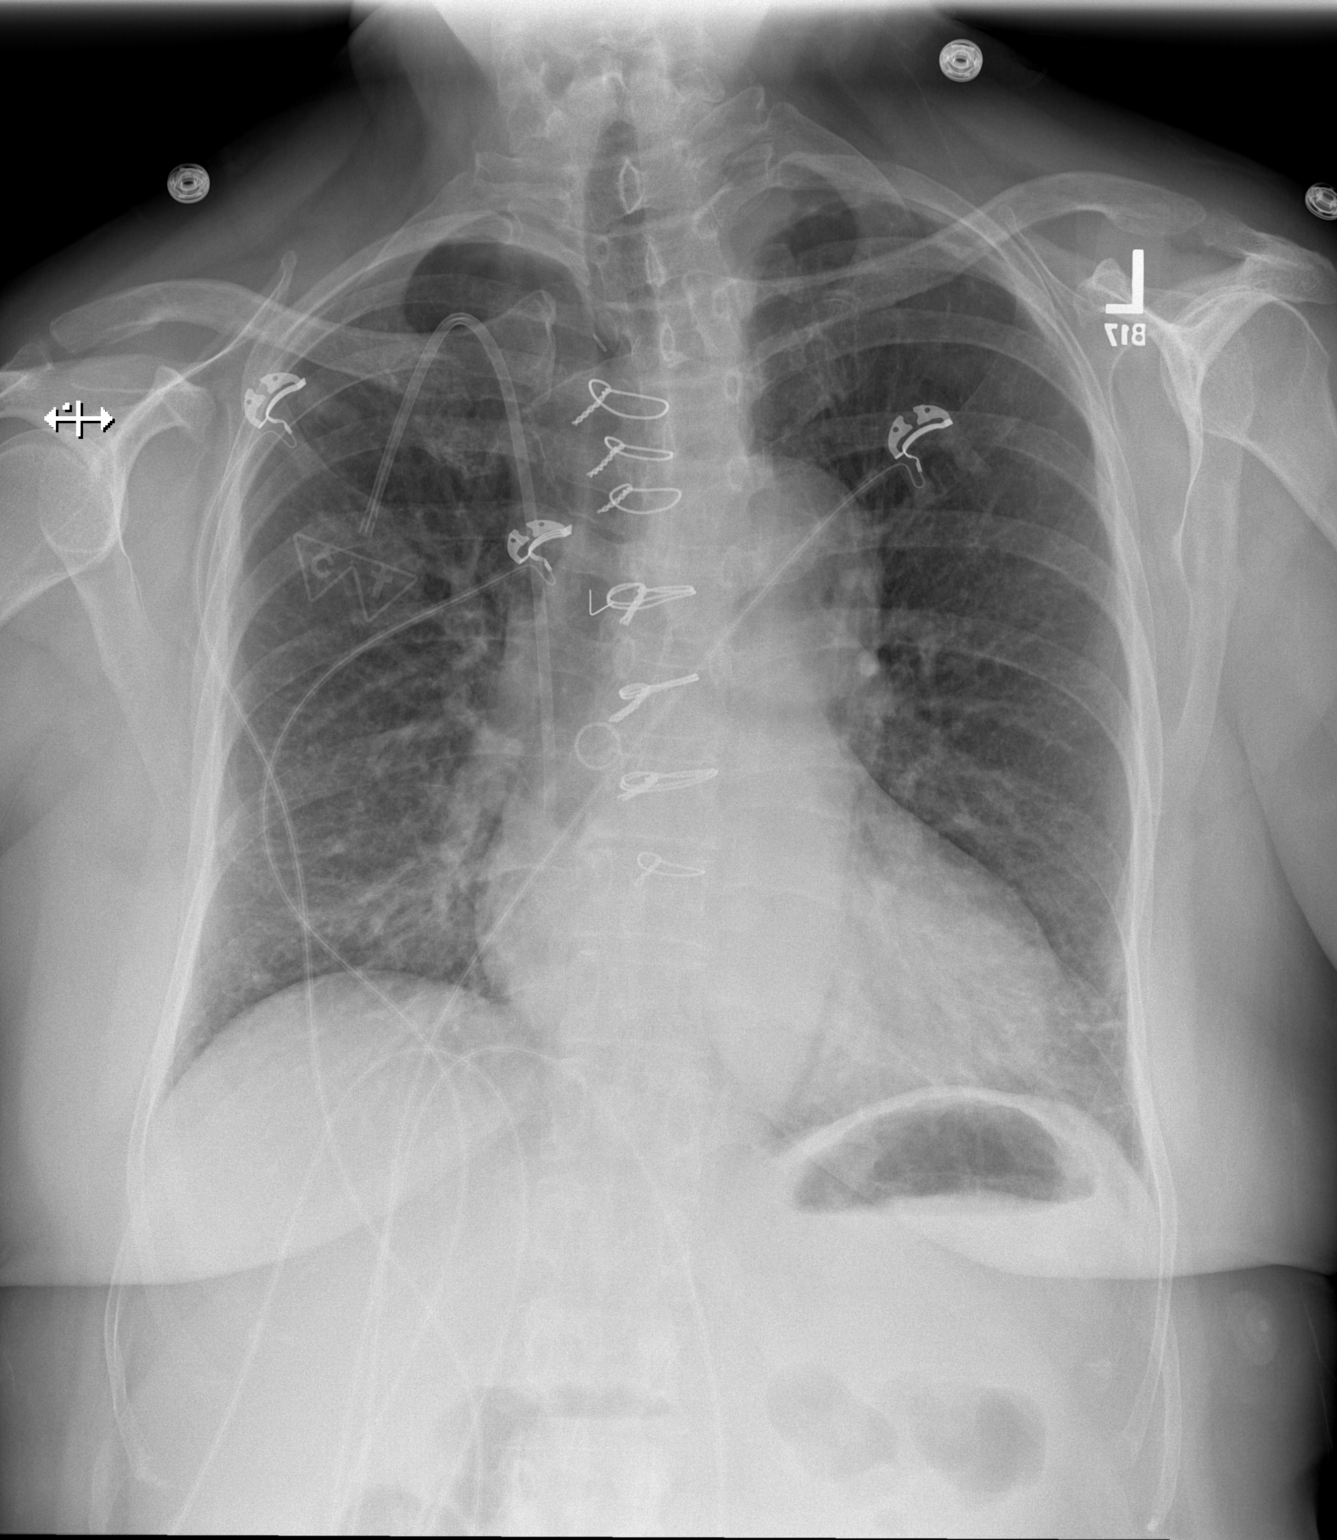

[w chest lat]
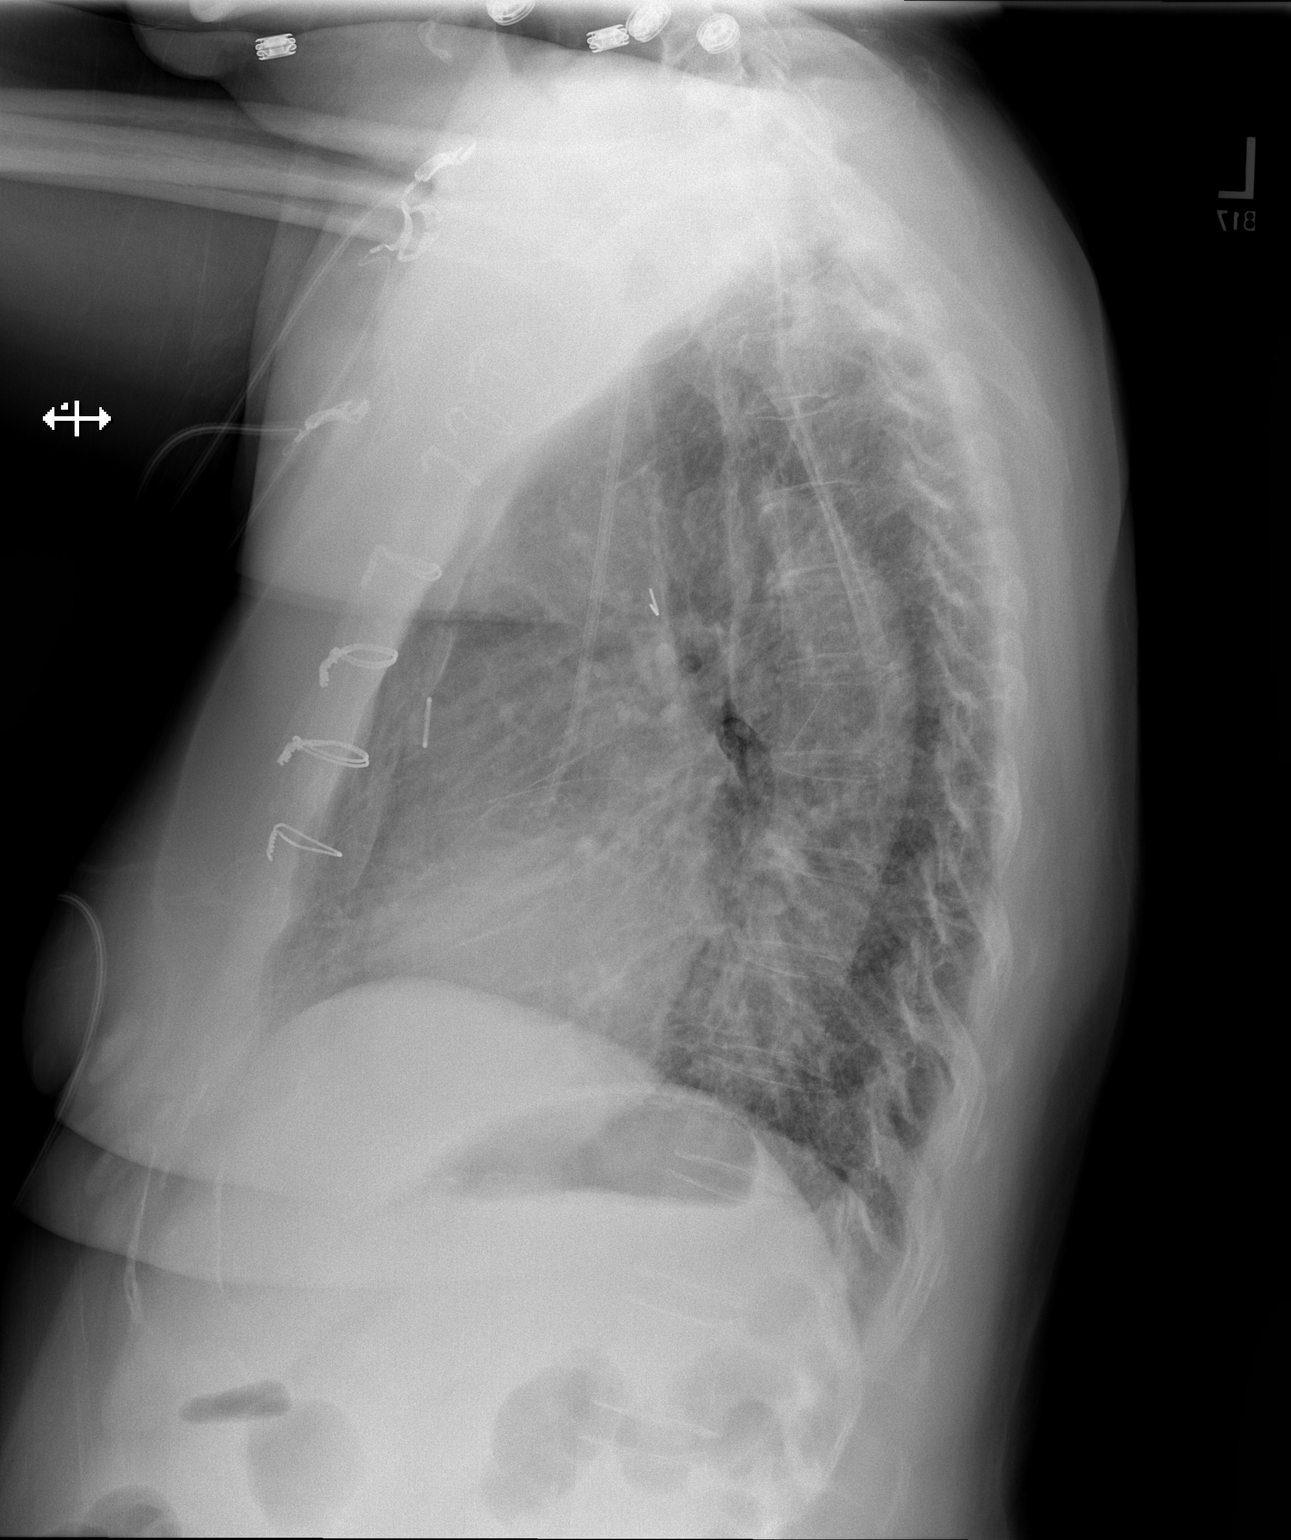

[2 of 2 positions shown; findings below may reference images not displayed]

FINDINGS: Chronic cardiomegaly. Pulmonary vascularity is normal. Power port in
place with the tip at the cavoatrial junction, unchanged. CABG.
Chronic situation of the interstitial markings at the left base
laterally. No effusions. No acute bone abnormality.
IMPRESSION: No acute abnormalities. Chronic cardiomegaly. Chronic interstitial
disease at the left lung base.

## 2019-01-31 DIAGNOSIS — E119 Type 2 diabetes mellitus without complications: Secondary | ICD-10-CM | POA: Diagnosis not present

## 2019-01-31 DIAGNOSIS — Z951 Presence of aortocoronary bypass graft: Secondary | ICD-10-CM | POA: Diagnosis not present

## 2019-01-31 DIAGNOSIS — C642 Malignant neoplasm of left kidney, except renal pelvis: Secondary | ICD-10-CM | POA: Diagnosis not present

## 2019-01-31 DIAGNOSIS — Z79899 Other long term (current) drug therapy: Secondary | ICD-10-CM | POA: Diagnosis not present

## 2019-01-31 DIAGNOSIS — M542 Cervicalgia: Secondary | ICD-10-CM | POA: Diagnosis not present

## 2019-01-31 DIAGNOSIS — L299 Pruritus, unspecified: Secondary | ICD-10-CM | POA: Diagnosis not present

## 2019-01-31 DIAGNOSIS — Z905 Acquired absence of kidney: Secondary | ICD-10-CM | POA: Diagnosis not present

## 2019-01-31 DIAGNOSIS — I2510 Atherosclerotic heart disease of native coronary artery without angina pectoris: Secondary | ICD-10-CM | POA: Diagnosis not present

## 2019-02-05 ENCOUNTER — Telehealth: Payer: Self-pay | Admitting: Cardiovascular Disease

## 2019-02-05 NOTE — Telephone Encounter (Signed)
Patient set up for MyChart? Yes sent consent through  My chart   Is patient using Smartphone/computer/tablet? yes  Did audio/video work?  Does patient need telephone visit? no  Best phone number to use?  [phone]  Special Instructions? Patient will have vitals, pt current on all blood work      Human resources officer Call  "(Name), I am calling you today to discuss your upcoming appointment. We are currently trying to limit exposure to the virus that causes COVID-19 by seeing patients at home rather than in the office."  1. "What is the BEST phone number to call the day of the visit?" - include this in appointment notes  2. Do you have or have access to (through a family member/friend) a smartphone with video capability that we can use for your visit?" a. If yes - list this number in appt notes as cell (if different from BEST phone #) and list the appointment type as a VIDEO visit in appointment notes b. If no - list the appointment type as a PHONE visit in appointment notes  3. Confirm consent - "In the setting of the current Covid19 crisis, you are scheduled for a (phone or video) visit with your provider on (date) at (time).  Just as we do with many in-office visits, in order for you to participate in this visit, we must obtain consent.  If you'd like, I can send this to your mychart (if signed up) or email for you to review.  Otherwise, I can obtain your verbal consent now.  All virtual visits are billed to your insurance company just like a normal visit would be.  By agreeing to a virtual visit, we'd like you to understand that the technology does not allow for your provider to perform an examination, and thus may limit your provider's ability to fully assess your condition. If your provider identifies any concerns that need to be evaluated in person, we will make arrangements to do so.  Finally, though the technology is pretty good, we cannot assure that it will  always work on either your or our end, and in the setting of a video visit, we may have to convert it to a phone-only visit.  In either situation, we cannot ensure that we have a secure connection.  Are you willing to proceed?" STAFF: Did the patient verbally acknowledge consent to telehealth visit? Document YES/NO here: yes  4. Advise patient to be prepared - "Two hours prior to your appointment, go ahead and check your blood pressure, pulse, oxygen saturation, and your weight (if you have the equipment to check those) and write them all down. When your visit starts, your provider will ask you for this information. If you have an Apple Watch or Kardia device, please plan to have heart rate information ready on the day of your appointment. Please have a pen and paper handy nearby the day of the visit as well."  5. Give patient instructions for MyChart download to smartphone OR [email or web address] as below if video visit (depending on what platform provider is using)  6. Inform patient they will receive a phone call 15 minutes prior to their appointment time (may be from unknown caller ID) so they should be prepared to answer    [name] has been deemed a candidate for a follow-up tele-health visit to limit community exposure during the Covid-19 pandemic. I spoke with the patient via phone to ensure availability of phone/video source,  confirm preferred email & phone number, and discuss instructions and expectations.  I reminded Sheri Hernandez to be prepared with any vital sign and/or heart rhythm information that could potentially be obtained via home monitoring, at the time of her visit. I reminded Sheri Hernandez to expect a phone call prior to her visit.  [name] 02/05/2019 3:24 PM   INSTRUCTIONS FOR DOWNLOADING THE MYCHART APP TO SMARTPHONE  - The patient must first make sure to have activated MyChart and know their login information - If Apple, go to [employer] and type in MyChart in the search bar and download the app. If Android, ask patient to go to [employer] and type in [place] in the search bar and download the app. The app is free but as with any other app downloads, their phone may require them to verify saved payment information or Apple/Android password.  - The patient will need to then log into the app with their MyChart username and password, and select [facility] as their healthcare provider to link the account. When it is time for your visit, go to the MyChart app, find appointments, and click Begin Video Visit. Be sure to Select Allow for your device to access the Microphone and Camera for your visit. You will then be connected, and your provider will be with you shortly.  **If they have any issues connecting, or need assistance please contact MyChart service desk [phone])**  **If using a computer, in order to ensure the best quality for their visit they will need to use either of the following Internet Browsers: [employer], or Google Chrome**  IF USING DOXIMITY or DOXY.ME - The patient will receive a link just prior to their visit by text.     FULL LENGTH CONSENT FOR TELE-HEALTH VISIT   I hereby voluntarily request, consent and authorize [facility] and its employed or contracted physicians, physician assistants, nurse practitioners or other licensed health care professionals (the Practitioner), to provide me with telemedicine health care services (the Services") as deemed necessary by the treating Practitioner. I acknowledge and consent to receive the Services by the Practitioner via telemedicine. I understand that the telemedicine visit will involve communicating with the Practitioner through live audiovisual communication technology and the disclosure of certain medical information by electronic transmission. I acknowledge that I have been given the opportunity to request an in-person assessment or  other available alternative prior to the telemedicine visit and am voluntarily participating in the telemedicine visit.  I understand that I have the right to withhold or withdraw my consent to the use of telemedicine in the course of my care at any time, without affecting my right to future care or treatment, and that the Practitioner or I may terminate the telemedicine visit at any time. I understand that I have the right to inspect all information obtained and/or recorded in the course of the telemedicine visit and may receive copies of available information for a reasonable fee.  I understand that some of the potential risks of receiving the Services via telemedicine include:   Delay or interruption in medical evaluation due to technological equipment failure or disruption;  Information transmitted may not be sufficient (e.g. poor resolution of images) to allow for appropriate medical decision making by the Practitioner; and/or   In rare instances, security protocols could fail, causing a breach of personal health information.  Furthermore, I acknowledge that it is my responsibility to provide information about my medical history,  conditions and care that is complete and accurate to the best of my ability. I acknowledge that Practitioner's advice, recommendations, and/or decision may be based on factors not within their control, such as incomplete or inaccurate data provided by me or distortions of diagnostic images or specimens that may result from electronic transmissions. I understand that the practice of medicine is not an exact science and that Practitioner makes no warranties or guarantees regarding treatment outcomes. I acknowledge that I will receive a copy of this consent concurrently upon execution via email to the email address I last provided but may also request a printed copy by calling the office of [facility].    I understand that my insurance will be billed for this visit.   I  have read or had this consent read to me.  I understand the contents of this consent, which adequately explains the benefits and risks of the Services being provided via telemedicine.   I have been provided ample opportunity to ask questions regarding this consent and the Services and have had my questions answered to my satisfaction.  I give my informed consent for the services to be provided through the use of telemedicine in my medical care  By participating in this telemedicine visit I agree to the above.

## 2019-02-06 ENCOUNTER — Telehealth (INDEPENDENT_AMBULATORY_CARE_PROVIDER_SITE_OTHER): Payer: Medicare Other | Admitting: Cardiovascular Disease

## 2019-02-06 ENCOUNTER — Encounter: Payer: Self-pay | Admitting: Cardiovascular Disease

## 2019-02-06 ENCOUNTER — Other Ambulatory Visit: Payer: Self-pay

## 2019-02-06 VITALS — BP 168/97 | HR 78 | Temp 97.5°F | Ht 68.0 in | Wt 215.0 lb

## 2019-02-06 DIAGNOSIS — I10 Essential (primary) hypertension: Secondary | ICD-10-CM

## 2019-02-06 DIAGNOSIS — Z1283 Encounter for screening for malignant neoplasm of skin: Secondary | ICD-10-CM | POA: Diagnosis not present

## 2019-02-06 DIAGNOSIS — I25708 Atherosclerosis of coronary artery bypass graft(s), unspecified, with other forms of angina pectoris: Secondary | ICD-10-CM

## 2019-02-06 DIAGNOSIS — X32XXXA Exposure to sunlight, initial encounter: Secondary | ICD-10-CM | POA: Diagnosis not present

## 2019-02-06 DIAGNOSIS — E782 Mixed hyperlipidemia: Secondary | ICD-10-CM

## 2019-02-06 DIAGNOSIS — L821 Other seborrheic keratosis: Secondary | ICD-10-CM | POA: Diagnosis not present

## 2019-02-06 DIAGNOSIS — L570 Actinic keratosis: Secondary | ICD-10-CM | POA: Diagnosis not present

## 2019-02-06 DIAGNOSIS — L820 Inflamed seborrheic keratosis: Secondary | ICD-10-CM | POA: Diagnosis not present

## 2019-02-06 MED ORDER — METOPROLOL TARTRATE 100 MG PO TABS: 100.0000 mg | ORAL_TABLET | Freq: Two times a day (BID) | ORAL | 3 refills | Status: DC

## 2019-02-06 NOTE — Progress Notes (Signed)
Virtual Visit via Video Note   This visit type was conducted due to national recommendations for restrictions regarding the COVID-19 Pandemic (e.g. social distancing) in an effort to limit this patient's exposure and mitigate transmission in our community.  Due to her co-morbid illnesses, this patient is at least at moderate risk for complications without adequate follow up.  This format is felt to be most appropriate for this patient at this time.  All issues noted in this document were discussed and addressed.  A limited physical exam was performed with this format.  Please refer to the patient's chart for her consent to telehealth for [facility].   Date:  02/06/2019   ID:  Sheri Hernandez, DOB Jan 15, 1956, MRN [id number]  Patient Location: Home Provider Location: Home  PCP:  Susy Frizzle, MD  Cardiologist:  Sherren Mocha, MD  Electrophysiologist:  None   Evaluation Performed:  Follow-Up Visit  Chief Complaint:  CAD/chronic angina  History of Present Illness:    Sheri Hernandez is a 63 y.o. female with hx of CAD and chronic angina.   She presented with non-STEMI in 2016 and underwent complex PCI of severe stenosis in the right coronary artery complicated by extensive dissection and acute inferior STEMI. She required emergency CABG with a saphenous vein graft to the right PDA and PLA. She had recurrent angina with cardiac catheterization demonstrating occlusion of her bypass graft. Her RCA remained patent with residual tight stenosis of the RCA with medical therapy recommended.  The patient is doing fairly well.  She denies shortness of breath.  Her exertional chest pain has improved.  Notes that her blood pressure has been higher of late.  She is also gained weight and has not been following a good diet.  She complains of postprandial anginal symptoms that feels like a chest pressure after she eats a large meal.  No orthopnea, PND, or heart palpitations.  Her leg swelling is  unchanged over time.  The patient does not have symptoms concerning for COVID-19 infection (fever, chills, cough, or new shortness of breath).    Past Medical History:  Diagnosis Date  . Anxiety   . Arthritis    "maybe in my right hand" (05/26/2017)  . CAD (coronary artery disease)    a. 12/2014 Cath: LM nl, LAD 20p, 66m, D2 20ost, LCX 95p, 59m, RCA dominant, 95p, 53m/d, RPDA 95, RPL2 50, attempted RCA PCI w/ acute RCA occlusion-->CABG x 2 VG->RPDA->PLVB.  Marland Kitchen Chronic diastolic CHF (congestive heart failure) ([place])   . Hypertension   . Hypoalbuminemia   . Hypothyroidism   . Lower extremity edema   . Lung mass    LUNG MASSES--COUGH-BIOPSY NON-DIAGNOSTIC.  HX OF MEDIASTINAL MASS AND LUNG MASSES AGE 37 -NEGATIVE BIOSPY-BUT GIVEN DIAGNOSIS OF SARCOIDOSIS.  WORK UP CONTINUING ON DIAGNOSIS FOR PT'S LUNG MASSES--SHE HAS BIOPSY PROVEN KIDNEY CANCER  . Myocardial infarction ([place]) 12/2014 X 2  . 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  . Papilloma of breast 2012   left. lumpectomy  . Pulmonary nodules   . Renal cell cancer [facility])    metastatic renal cell carcinoma (biopsy of left renal mass 10/2012) On nivolumab at [facility]  . S/p nephrectomy    left, due to metastatic RCC, 3/14  Past Surgical History:  Procedure Laterality Date  . BREAST BIOPSY  08/18/2011   Procedure: BREAST BIOPSY WITH NEEDLE LOCALIZATION;  Surgeon: Merrie Roof, MD;  Location: [place];  Service: General;  Laterality: Left;  left breast biopsy with needle localization  . BRONCHIAL BRUSH BIOPSY  2014  . CARDIAC CATHETERIZATION    . CORONARY ARTERY BYPASS GRAFT N/A 01/02/2015   Procedure: CORONARY ARTERY BYPASS GRAFTING (CABG)TIMES TWO USING RIGHT GREATER SAPHENOUS LEG VEIN HARVESTED ENDOSCOPICALLY;  Surgeon: [name],  MD;  Location: [place];  Service: Open Heart Surgery;  Laterality: N/A;  . LAPAROSCOPIC NEPHRECTOMY Left 11/22/2012   Procedure: LAPAROSCOPIC NEPHRECTOMY;  Surgeon: Dutch Gray, MD;  Location: [facility];  Service: Urology;  Laterality: Left;  . LEFT HEART CATH AND CORS/GRAFTS ANGIOGRAPHY N/A 10/11/2016   Procedure: Left Heart Cath and Cors/Grafts Angiography;  Surgeon: Sherren Mocha, MD;  Location: [place] CV LAB;  Service: Cardiovascular;  Laterality: N/A;  . LEFT HEART CATHETERIZATION WITH CORONARY ANGIOGRAM N/A 01/02/2015   Procedure: LEFT HEART CATHETERIZATION WITH CORONARY ANGIOGRAM;  Surgeon: Leonie Man, MD;  Location: [facility] CATH LAB;  Service: Cardiovascular;  Laterality: N/A;  . NECK LESION BIOPSY Left 08/2016  . PORTA CATH INSERTION Right ~ 2017  . TONSILLECTOMY  1967  . VIDEO BRONCHOSCOPY  10/11/2012   Procedure: VIDEO BRONCHOSCOPY WITH FLUORO;  Surgeon: Kathee Delton, MD;  Location: [facility];  Service: Cardiopulmonary;  Laterality: Bilateral;  . WIDS    . WISDOM TOOTH EXTRACTION     "all 4"     Current Meds  Medication Sig  . Acetaminophen (TYLENOL PO) Take 2 tablets by mouth every 6 (six) hours as needed (headache).  Marland Kitchen aspirin 81 MG EC tablet Take 1 tablet (81 mg total) by mouth daily.  Marland Kitchen atorvastatin (LIPITOR) 40 MG tablet TAKE 1 TABLET (40 MG TOTAL) BY MOUTH DAILY.  Marland Kitchen Cholecalciferol (VITAMIN D PO) Take 1 tablet by mouth daily.  . clopidogrel (PLAVIX) 75 MG tablet TAKE 1 TABLET BY MOUTH EVERY DAY  . furosemide (LASIX) 40 MG tablet Take 1 tablet (40 mg total) by mouth daily.  Marland Kitchen HYDROcodone-acetaminophen (NORCO/VICODIN) 5-325 MG tablet Take 1 tablet by mouth as needed.  . isosorbide mononitrate (IMDUR) 60 MG 24 hr tablet Take 1 tablet (60 mg total) by mouth daily.  Marland Kitchen levothyroxine (SYNTHROID, LEVOTHROID) 137 MCG tablet TAKE 1 TABLET (137 MCG TOTAL) BY MOUTH DAILY BEFORE BREAKFAST.  Marland Kitchen lidocaine-prilocaine (EMLA) cream APPLY CREAM TO PORTACATH SITE APPROXIMATELY 1 HOUR BEFORE  ACCESS AND COVER  . metoprolol tartrate (LOPRESSOR) 50 MG tablet Take 1.5 tablets (75 mg total) by mouth 2 (two) times daily. Pt needs to make appt for further refills  . nitroGLYCERIN (NITROSTAT) 0.4 MG SL tablet Place 1 tablet (0.4 mg total) under the tongue every 5 (five) minutes as needed for chest pain.  . Nivolumab (OPDIVO IV) Inject into the vein every 30 (thirty) days. Chemo therapy  . oxycodone (OXY-IR) 5 MG capsule Take 5 mg by mouth daily as needed (severe pain). pain     Allergies:   Silver   Social History   Tobacco Use  . Smoking status: Former Smoker    Packs/day: 1.50    Years: 40.00    Pack years: 60.00    Types: Cigarettes    Last attempt to quit: 09/24/2012    Years since quitting: 6.3  . Smokeless tobacco: Never Used  Substance Use Topics  . Alcohol use: Yes    Comment: 05/26/2017 "  couple drinks/year"  . Drug use: No     Family Hx: The patient's family history includes Aortic dissection in her brother; Breast cancer in her maternal aunt; Cancer in her maternal grandfather; Colon cancer in her maternal aunt; Heart attack in her father; Heart disease in her brother, father, maternal grandmother, and mother; Skin cancer in her maternal uncle; Stomach cancer in her maternal uncle; Stroke in her brother.  ROS:   Please see the history of present illness.    All other systems reviewed and are negative.   Prior CV studies:   The following studies were reviewed today:  Echo 08/22/2018: Study Conclusions  - Left ventricle: The cavity size was normal. Wall thickness was   increased in a pattern of mild LVH. Systolic function was normal.   The estimated ejection fraction was in the range of 55% to 60%.   Wall motion was normal; there were no regional wall motion   abnormalities. Features are consistent with a pseudonormal left   ventricular filling pattern, with concomitant abnormal relaxation   and increased filling pressure (grade 2 diastolic dysfunction). -  Aortic valve: There was no stenosis. There was trivial   regurgitation. - Aorta: Ascending aortic diameter: 38 mm (S). - Ascending aorta: The ascending aorta was mildly dilated. - Mitral valve: There was mild regurgitation. - Right ventricle: The cavity size was normal. Systolic function   was normal. - Tricuspid valve: Peak RV-RA gradient (S): 20 mm Hg. - Pulmonary arteries: PA peak pressure: 23 mm Hg (S). - Inferior vena cava: The vessel was normal in size. The   respirophasic diameter changes were in the normal range (>= 50%),   consistent with normal central venous pressure.  Impressions:  - Normal LV size with mild LV hypertrophy. EF 55-60%. Moderate   diastolic dysfunction. Normal RV size and systolic function. Mild   mitral regurgitation.  Labs/Other Tests and Data Reviewed:    EKG:  No ECG reviewed.  Recent Labs: 03/07/2018: BUN 14; Creatinine, Ser 1.02; Hemoglobin 14.7; Platelets 343; Potassium 3.7; Sodium 142   Recent Lipid Panel Lab Results  Component Value Date/Time   CHOL 90 01/03/2015 02:10 AM   TRIG 77 01/03/2015 02:10 AM   HDL 15 (L) 01/03/2015 02:10 AM   CHOLHDL 6.0 01/03/2015 02:10 AM   LDLCALC 60 01/03/2015 02:10 AM    Wt Readings from Last 3 Encounters:  02/06/19 215 lb (97.5 kg)  12/24/18 214 lb (97.1 kg)  08/08/18 211 lb 12.8 oz (96.1 kg)     Objective:    Vital Signs:  BP (!) 168/97 (BP Location: Left Arm, Patient Position: Sitting, Cuff Size: Normal)   Pulse 78   Temp (!) 97.5 F (36.4 C)   Ht 5\' 8"  (1.727 m)   Wt 215 lb (97.5 kg)   BMI 32.69 kg/m   VITAL SIGNS:  reviewed Patient is alert, oriented, in no distress Breathing comfortably in normal conversation  ASSESSMENT & PLAN:    1. CAD, native vessel, with angina: The patient has postprandial angina.  Her exertional symptoms have almost resolved and she is doing much better in that regard.  She is eating a single large meal and I talked to her about smaller meals and healthier food  choices.  We will continue her current antianginal therapy. 2. Hypertension, uncontrolled: I asked her to increase metoprolol to 100 mg twice daily.  We discussed adding losartan, but she was resistant to another medication.  I asked her to work hard on sodium  restriction, dietary modification, and weight loss.  I trended her weights and reviewed the data with her.  Her weight is up 13 to 15 pounds over the past 12 to 18 months. 3. Mixed hyperlipidemia: Treated with a statin drug.  Most recent lipids reviewed.  COVID-19 Education: The signs and symptoms of COVID-19 were discussed with the patient and how to seek care for testing (follow up with PCP or arrange E-visit).  The importance of social distancing was discussed today.  Time:   Today, I have spent 16 minutes with the patient with telehealth technology discussing the above problems.     Medication Adjustments/Labs and Tests Ordered: Current medicines are reviewed at length with the patient today.  Concerns regarding medicines are outlined above.   Tests Ordered: No orders of the defined types were placed in this encounter.   Medication Changes: No orders of the defined types were placed in this encounter.   Disposition:  Follow up in 4 month(s)  Signed, Sherren Mocha, MD  02/06/2019 4:39 PM    [place]

## 2019-02-06 NOTE — Addendum Note (Signed)
Addended by: Harland German A on: 02/06/2019 05:09 PM   Modules accepted: Orders

## 2019-02-06 NOTE — Patient Instructions (Signed)
Medication Instructions:  1) INCREASE METOPROLOL to 100 mg twice daily   Follow-Up: Your provider wants you to follow-up in: 4 months with Dr. Burt Knack or his assistant. You will receive a reminder letter in the mail two months in advance. If you don't receive a letter, please call our office to schedule the follow-up appointment.

## 2019-02-20 DIAGNOSIS — L820 Inflamed seborrheic keratosis: Secondary | ICD-10-CM | POA: Diagnosis not present

## 2019-02-28 DIAGNOSIS — C642 Malignant neoplasm of left kidney, except renal pelvis: Secondary | ICD-10-CM | POA: Diagnosis not present

## 2019-02-28 DIAGNOSIS — L299 Pruritus, unspecified: Secondary | ICD-10-CM | POA: Diagnosis not present

## 2019-02-28 DIAGNOSIS — R52 Pain, unspecified: Secondary | ICD-10-CM | POA: Diagnosis not present

## 2019-02-28 DIAGNOSIS — R21 Rash and other nonspecific skin eruption: Secondary | ICD-10-CM | POA: Diagnosis not present

## 2019-02-28 DIAGNOSIS — R918 Other nonspecific abnormal finding of lung field: Secondary | ICD-10-CM | POA: Diagnosis not present

## 2019-02-28 DIAGNOSIS — E119 Type 2 diabetes mellitus without complications: Secondary | ICD-10-CM | POA: Diagnosis not present

## 2019-02-28 DIAGNOSIS — Z9889 Other specified postprocedural states: Secondary | ICD-10-CM | POA: Diagnosis not present

## 2019-02-28 DIAGNOSIS — I2510 Atherosclerotic heart disease of native coronary artery without angina pectoris: Secondary | ICD-10-CM | POA: Diagnosis not present

## 2019-03-07 ENCOUNTER — Encounter: Payer: Self-pay | Admitting: Family Medicine

## 2019-03-07 ENCOUNTER — Ambulatory Visit (INDEPENDENT_AMBULATORY_CARE_PROVIDER_SITE_OTHER): Payer: Medicare Other | Admitting: Family Medicine

## 2019-03-07 ENCOUNTER — Other Ambulatory Visit: Payer: Self-pay

## 2019-03-07 VITALS — BP 140/82 | HR 88 | Temp 98.4°F | Resp 14 | Ht 68.0 in | Wt 213.0 lb

## 2019-03-07 DIAGNOSIS — W57XXXD Bitten or stung by nonvenomous insect and other nonvenomous arthropods, subsequent encounter: Secondary | ICD-10-CM | POA: Diagnosis not present

## 2019-03-07 DIAGNOSIS — I25708 Atherosclerosis of coronary artery bypass graft(s), unspecified, with other forms of angina pectoris: Secondary | ICD-10-CM | POA: Diagnosis not present

## 2019-03-07 DIAGNOSIS — E032 Hypothyroidism due to medicaments and other exogenous substances: Secondary | ICD-10-CM | POA: Diagnosis not present

## 2019-03-07 MED ORDER — LEVOTHYROXINE SODIUM 150 MCG PO TABS: ORAL_TABLET | ORAL | 1 refills | Status: DC

## 2019-03-07 MED ORDER — DOXYCYCLINE HYCLATE 100 MG PO TABS: 100.0000 mg | ORAL_TABLET | Freq: Two times a day (BID) | ORAL | 0 refills | Status: DC

## 2019-03-07 NOTE — Patient Instructions (Addendum)
Call Dr. Nevada Crane  Start doxycycline antibiotic  F/U as needed

## 2019-03-07 NOTE — Assessment & Plan Note (Signed)
Increase synthroid to 161mcg Labs checked monthly at [facility] will see how she does clinically with symptoms too

## 2019-03-07 NOTE — Progress Notes (Signed)
   Subjective:    Patient ID: Sheri Hernandez, female    DOB: 31-Oct-1955, 63 y.o.   MRN: [id number]  Patient presents for TIck Bite (has been treated by PCP aith ABTx on 12/24/2018- faded with tx, but now has returned)   Pt here with non healing tick bite for the past 2 months. Seen in April given doxycycline, initially spot on left upper arm faded but now more red and has bump in center and now spot is enlarging over past 3 days   No fever chills, no body aches,  No rash elesewhere    Recent labs  6/25 WBC 10.7  Hb 13.4  , HCT 41.3  At [facility] , Spent 5-10 minutes looking through her phone and care everywhere for these labs    Also asked for thyroid medication to be adjusted, TSH has been normal but up and down typically around 1, most recent at  4, T3 has been elevated. She has had montly checks on thyroid at [facility] during her chemo for Kidney cancer, but no changes  She feels sluggish all the time   [facility]       Needs thyroid medication adjusted, they are off  Review Of Systems:  GEN- denies fatigue, fever, weight loss,weakness, recent illness HEENT- denies eye drainage, change in vision, nasal discharge, CVS- denies chest pain, palpitations RESP- denies SOB, cough, wheeze ABD- denies N/V, change in stools, abd pain GU- denies dysuria, hematuria, dribbling, incontinence MSK- denies joint pain, muscle aches, injury Neuro- denies headache, dizziness, syncope, seizure activity       Objective:    BP 140/82   Pulse 88   Temp 98.4 F (36.9 C) (Oral)   Resp 14   Ht 5\' 8"  (1.727 m)   Wt 213 lb (96.6 kg)   SpO2 98%   BMI 32.39 kg/m  GEN- NAD, alert and oriented x3 HEENT- PERRL, EOMI, non injected sclera, pink conjunctiva, MMM, oropharynx clear Neck- Supple, no thyromegaly CVS- RRR, no murmur RESP-CTAB Skin left upper arm inner aspect dime size erythematous lesion with smalls nodule in center, NT EXT- No edema Pulses- Radial2+        Assessment & Plan:       Problem List Items Addressed This Visit      Unprioritized   Drug-induced hypothyroidism    Increase synthroid to 111mcg Labs checked monthly at [facility] will see how she does clinically with symptoms too       Relevant Medications   levothyroxine (SYNTHROID) 150 MCG tablet    Other Visit Diagnoses    Tick bite, subsequent encounter    -  Primary   Discussed Iand D would be best ensure entire tick removed, she has fear of needles doesnt want the pain and declined, discussed it would be to best  Since area getting worse  she does not have systemic symptoms Gave her other option of going to her dermatologist, she will schedule with them In mean time I will start doxycycline again      Note: This dictation was prepared with Dragon dictation along with smaller phrase technology. Any transcriptional errors that result from this process are unintentional.

## 2019-03-24 ENCOUNTER — Other Ambulatory Visit: Payer: Self-pay | Admitting: Cardiovascular Disease

## 2019-03-24 DIAGNOSIS — R002 Palpitations: Secondary | ICD-10-CM

## 2019-03-24 DIAGNOSIS — I2510 Atherosclerotic heart disease of native coronary artery without angina pectoris: Secondary | ICD-10-CM

## 2019-03-24 DIAGNOSIS — R609 Edema, unspecified: Secondary | ICD-10-CM

## 2019-03-28 DIAGNOSIS — C642 Malignant neoplasm of left kidney, except renal pelvis: Secondary | ICD-10-CM | POA: Diagnosis not present

## 2019-03-28 DIAGNOSIS — Z5112 Encounter for antineoplastic immunotherapy: Secondary | ICD-10-CM | POA: Diagnosis not present

## 2019-03-28 DIAGNOSIS — C641 Malignant neoplasm of right kidney, except renal pelvis: Secondary | ICD-10-CM | POA: Diagnosis not present

## 2019-04-01 ENCOUNTER — Other Ambulatory Visit: Payer: Self-pay | Admitting: Cardiovascular Disease

## 2019-04-19 ENCOUNTER — Other Ambulatory Visit: Payer: Self-pay | Admitting: Cardiovascular Disease

## 2019-04-19 NOTE — Telephone Encounter (Signed)
Outpatient Medication Detail   Disp Refills Start End   metoprolol tartrate (LOPRESSOR) 100 MG tablet 180 tablet 3 02/06/2019 02/01/2020   Sig - Route: Take 1 tablet (100 mg total) by mouth 2 (two) times daily. - Oral   Sent to pharmacy as: metoprolol tartrate (LOPRESSOR) 100 MG tablet   E-Prescribing Status: Receipt confirmed by pharmacy (02/06/2019 5:08 PM EDT)   Pharmacy  [id number] - [place], [place] - [address]

## 2019-04-25 DIAGNOSIS — C649 Malignant neoplasm of unspecified kidney, except renal pelvis: Secondary | ICD-10-CM | POA: Diagnosis not present

## 2019-04-25 DIAGNOSIS — Z905 Acquired absence of kidney: Secondary | ICD-10-CM | POA: Diagnosis not present

## 2019-04-25 DIAGNOSIS — C642 Malignant neoplasm of left kidney, except renal pelvis: Secondary | ICD-10-CM | POA: Diagnosis not present

## 2019-04-25 DIAGNOSIS — R918 Other nonspecific abnormal finding of lung field: Secondary | ICD-10-CM | POA: Diagnosis not present

## 2019-04-25 DIAGNOSIS — I2510 Atherosclerotic heart disease of native coronary artery without angina pectoris: Secondary | ICD-10-CM | POA: Diagnosis not present

## 2019-04-25 DIAGNOSIS — K760 Fatty (change of) liver, not elsewhere classified: Secondary | ICD-10-CM | POA: Diagnosis not present

## 2019-04-25 DIAGNOSIS — E118 Type 2 diabetes mellitus with unspecified complications: Secondary | ICD-10-CM | POA: Diagnosis not present

## 2019-04-25 DIAGNOSIS — R21 Rash and other nonspecific skin eruption: Secondary | ICD-10-CM | POA: Diagnosis not present

## 2019-04-29 ENCOUNTER — Other Ambulatory Visit: Payer: Self-pay | Admitting: Family Medicine

## 2019-05-14 ENCOUNTER — Other Ambulatory Visit: Payer: Self-pay

## 2019-05-14 ENCOUNTER — Encounter: Payer: Self-pay | Admitting: Family Medicine

## 2019-05-14 ENCOUNTER — Ambulatory Visit (INDEPENDENT_AMBULATORY_CARE_PROVIDER_SITE_OTHER): Payer: Medicare Other | Admitting: Family Medicine

## 2019-05-14 DIAGNOSIS — R6889 Other general symptoms and signs: Secondary | ICD-10-CM | POA: Diagnosis not present

## 2019-05-14 DIAGNOSIS — B349 Viral infection, unspecified: Secondary | ICD-10-CM

## 2019-05-14 DIAGNOSIS — I25708 Atherosclerosis of coronary artery bypass graft(s), unspecified, with other forms of angina pectoris: Secondary | ICD-10-CM | POA: Diagnosis not present

## 2019-05-14 DIAGNOSIS — Z20822 Contact with and (suspected) exposure to covid-19: Secondary | ICD-10-CM

## 2019-05-14 MED ORDER — BENZONATATE 100 MG PO CAPS: 100.0000 mg | ORAL_CAPSULE | Freq: Three times a day (TID) | ORAL | 0 refills | Status: DC | PRN

## 2019-05-14 MED ORDER — ONDANSETRON 4 MG PO TBDP
4.0000 mg | ORAL_TABLET | Freq: Three times a day (TID) | ORAL | 0 refills | Status: AC | PRN
Start: 2019-05-14 — End: ?

## 2019-05-14 NOTE — Progress Notes (Signed)
Virtual Visit via Telephone Note  I connected with Sheri Hernandez on 05/14/19 at 10:34am  by telephone and verified that I am speaking with the correct person using two identifiers.      Pt location: at home   Physician location:  In office, [facility], Vic Blackbird MD     On call: patient and physician   I discussed the limitations, risks, security and privacy concerns of performing an evaluation and management service by telephone and the availability of in person appointments. I also discussed with the patient that there may be a patient responsible charge related to this service. The patient expressed understanding and agreed to proceed.    LVM 10:26am pt did not answer   History of Present Illness:  [place] , then 9/2 she started with fatigue, and tightness in the chest, with a lot of mucous in chest, felt like she couldn't breathe. She used OTC decongestant for a couple days and she is getting minimal amount of white mucous up   She does have underlying RCC currently on surveilance  Past couple days has chills/ temp range  62F up 90F, she has been taking tylenol and hydrocodone with acetaminophen   Using heating pad and blankets  Husband has had mild symptoms, had flu like symptoms for 24 hours  She has nausea without vomiting decreased appetite  Observations/Objective: NAD, able to speak in full sentences unable to visualize over the phone.  Assessment and Plan:  Viral illness concerning for COVID-19 with progressive symptoms he seems to peak around a 5 or 6 after possible exposure at the zoo.  Recommend that she go have COVID swab done.  She became a little dramatic on the phone stating that she was going to die she did not want to have a swab done because it was been negative to her brain.  Her husband came on the phone stated that he will have a swab done as well to make sure she could "tolerated".  In the end she agreed to go over to have  swabbing done for COVID-19.  She is not tolerating food secondary to nausea though she does not have any vomiting.  Im going to  send her over Zofran ODT.  Also send over [facility] as she is currently already on narcotic medication. Follow Up Instructions:    I discussed the assessment and treatment plan with the patient. The patient was provided an opportunity to ask questions and all were answered. The patient agreed with the plan and demonstrated an understanding of the instructions.   The patient was advised to call back or seek an in-person evaluation if the symptoms worsen or if the condition fails to improve as anticipated.  I provided 17 minutes of non-face-to-face time during this encounter. End time 10:51am   Vic Blackbird, MD

## 2019-05-16 LAB — NOVEL CORONAVIRUS, NAA: SARS-CoV-2, NAA: DETECTED — AB

## 2019-05-23 ENCOUNTER — Encounter (INDEPENDENT_AMBULATORY_CARE_PROVIDER_SITE_OTHER): Payer: Self-pay

## 2019-05-24 ENCOUNTER — Other Ambulatory Visit: Payer: Self-pay | Admitting: *Deleted

## 2019-05-24 DIAGNOSIS — R6889 Other general symptoms and signs: Secondary | ICD-10-CM | POA: Diagnosis not present

## 2019-05-24 DIAGNOSIS — Z20822 Contact with and (suspected) exposure to covid-19: Secondary | ICD-10-CM

## 2019-05-25 ENCOUNTER — Encounter (INDEPENDENT_AMBULATORY_CARE_PROVIDER_SITE_OTHER): Payer: Self-pay

## 2019-05-26 ENCOUNTER — Encounter (INDEPENDENT_AMBULATORY_CARE_PROVIDER_SITE_OTHER): Payer: Self-pay

## 2019-05-26 LAB — NOVEL CORONAVIRUS, NAA: SARS-CoV-2, NAA: NOT DETECTED

## 2019-05-30 DIAGNOSIS — Z87891 Personal history of nicotine dependence: Secondary | ICD-10-CM | POA: Diagnosis not present

## 2019-05-30 DIAGNOSIS — C642 Malignant neoplasm of left kidney, except renal pelvis: Secondary | ICD-10-CM | POA: Diagnosis not present

## 2019-05-30 DIAGNOSIS — E119 Type 2 diabetes mellitus without complications: Secondary | ICD-10-CM | POA: Diagnosis not present

## 2019-05-30 DIAGNOSIS — L299 Pruritus, unspecified: Secondary | ICD-10-CM | POA: Diagnosis not present

## 2019-05-30 DIAGNOSIS — R918 Other nonspecific abnormal finding of lung field: Secondary | ICD-10-CM | POA: Diagnosis not present

## 2019-05-30 DIAGNOSIS — R21 Rash and other nonspecific skin eruption: Secondary | ICD-10-CM | POA: Diagnosis not present

## 2019-05-30 DIAGNOSIS — I2510 Atherosclerotic heart disease of native coronary artery without angina pectoris: Secondary | ICD-10-CM | POA: Diagnosis not present

## 2019-05-30 DIAGNOSIS — Z951 Presence of aortocoronary bypass graft: Secondary | ICD-10-CM | POA: Diagnosis not present

## 2019-05-30 DIAGNOSIS — Z79899 Other long term (current) drug therapy: Secondary | ICD-10-CM | POA: Diagnosis not present

## 2019-05-30 DIAGNOSIS — C641 Malignant neoplasm of right kidney, except renal pelvis: Secondary | ICD-10-CM | POA: Diagnosis not present

## 2019-05-31 ENCOUNTER — Other Ambulatory Visit: Payer: Self-pay | Admitting: *Deleted

## 2019-05-31 MED ORDER — LEVOTHYROXINE SODIUM 150 MCG PO TABS: 150.0000 ug | ORAL_TABLET | Freq: Every day | ORAL | 1 refills | Status: DC

## 2019-06-21 ENCOUNTER — Other Ambulatory Visit: Payer: Self-pay | Admitting: Family Medicine

## 2019-06-27 DIAGNOSIS — Z5111 Encounter for antineoplastic chemotherapy: Secondary | ICD-10-CM | POA: Diagnosis not present

## 2019-06-27 DIAGNOSIS — C642 Malignant neoplasm of left kidney, except renal pelvis: Secondary | ICD-10-CM | POA: Diagnosis not present

## 2019-07-03 ENCOUNTER — Telehealth: Payer: Self-pay | Admitting: Cardiovascular Disease

## 2019-07-03 NOTE — Telephone Encounter (Signed)
The patient states the refills were not due to increased amount of chest pain but losing NTG tablets. She said she feels fine and everything is "normal" for her. She does have to take the occasional NTG tablet but no more than usual. She was grateful for checking in on her. She understands to call if she needs anything or symptoms escalate from normal.

## 2019-07-03 NOTE — Telephone Encounter (Signed)
[facility] stating that [facility] has refilled her medication Nitroglycerin on 06/28/19 and today 07/03/19, pt is requesting another refill. Pharmacy wanted to inform Dr. Burt Knack of this matter to see if pt such be taking this medication using 25 tablet in 1 week. Please address

## 2019-07-03 NOTE — Telephone Encounter (Signed)
Valetta Fuller would you mind touching base with her to see if she's really having this much chest pain? thanks

## 2019-07-20 ENCOUNTER — Other Ambulatory Visit: Payer: Self-pay | Admitting: Family Medicine

## 2019-07-25 DIAGNOSIS — C641 Malignant neoplasm of right kidney, except renal pelvis: Secondary | ICD-10-CM | POA: Diagnosis not present

## 2019-08-02 ENCOUNTER — Other Ambulatory Visit: Payer: Self-pay | Admitting: Cardiovascular Disease

## 2019-08-02 DIAGNOSIS — E785 Hyperlipidemia, unspecified: Secondary | ICD-10-CM

## 2019-08-02 DIAGNOSIS — I25708 Atherosclerosis of coronary artery bypass graft(s), unspecified, with other forms of angina pectoris: Secondary | ICD-10-CM

## 2019-08-13 ENCOUNTER — Ambulatory Visit: Payer: Medicare Other | Admitting: Family Medicine

## 2019-08-27 ENCOUNTER — Other Ambulatory Visit: Payer: Self-pay | Admitting: Cardiovascular Disease

## 2019-09-19 DIAGNOSIS — I2510 Atherosclerotic heart disease of native coronary artery without angina pectoris: Secondary | ICD-10-CM | POA: Diagnosis not present

## 2019-09-19 DIAGNOSIS — L299 Pruritus, unspecified: Secondary | ICD-10-CM | POA: Diagnosis not present

## 2019-09-19 DIAGNOSIS — C641 Malignant neoplasm of right kidney, except renal pelvis: Secondary | ICD-10-CM | POA: Diagnosis not present

## 2019-09-19 DIAGNOSIS — C642 Malignant neoplasm of left kidney, except renal pelvis: Secondary | ICD-10-CM | POA: Diagnosis not present

## 2019-09-19 DIAGNOSIS — Z951 Presence of aortocoronary bypass graft: Secondary | ICD-10-CM | POA: Diagnosis not present

## 2019-09-19 DIAGNOSIS — E785 Hyperlipidemia, unspecified: Secondary | ICD-10-CM | POA: Diagnosis not present

## 2019-09-19 DIAGNOSIS — R21 Rash and other nonspecific skin eruption: Secondary | ICD-10-CM | POA: Diagnosis not present

## 2019-09-19 DIAGNOSIS — Z905 Acquired absence of kidney: Secondary | ICD-10-CM | POA: Diagnosis not present

## 2019-09-19 DIAGNOSIS — E119 Type 2 diabetes mellitus without complications: Secondary | ICD-10-CM | POA: Diagnosis not present

## 2019-09-19 DIAGNOSIS — R52 Pain, unspecified: Secondary | ICD-10-CM | POA: Diagnosis not present

## 2019-09-19 DIAGNOSIS — E1165 Type 2 diabetes mellitus with hyperglycemia: Secondary | ICD-10-CM | POA: Diagnosis not present

## 2019-09-19 DIAGNOSIS — Z87891 Personal history of nicotine dependence: Secondary | ICD-10-CM | POA: Diagnosis not present

## 2019-10-04 DIAGNOSIS — L308 Other specified dermatitis: Secondary | ICD-10-CM | POA: Diagnosis not present

## 2019-10-04 DIAGNOSIS — L820 Inflamed seborrheic keratosis: Secondary | ICD-10-CM | POA: Diagnosis not present

## 2019-10-17 DIAGNOSIS — R918 Other nonspecific abnormal finding of lung field: Secondary | ICD-10-CM | POA: Diagnosis not present

## 2019-10-17 DIAGNOSIS — L299 Pruritus, unspecified: Secondary | ICD-10-CM | POA: Diagnosis not present

## 2019-10-17 DIAGNOSIS — R52 Pain, unspecified: Secondary | ICD-10-CM | POA: Diagnosis not present

## 2019-10-17 DIAGNOSIS — R739 Hyperglycemia, unspecified: Secondary | ICD-10-CM | POA: Diagnosis not present

## 2019-10-17 DIAGNOSIS — Z79899 Other long term (current) drug therapy: Secondary | ICD-10-CM | POA: Diagnosis not present

## 2019-10-17 DIAGNOSIS — E1165 Type 2 diabetes mellitus with hyperglycemia: Secondary | ICD-10-CM | POA: Diagnosis not present

## 2019-10-17 DIAGNOSIS — C642 Malignant neoplasm of left kidney, except renal pelvis: Secondary | ICD-10-CM | POA: Diagnosis not present

## 2019-10-17 DIAGNOSIS — C641 Malignant neoplasm of right kidney, except renal pelvis: Secondary | ICD-10-CM | POA: Diagnosis not present

## 2019-10-17 DIAGNOSIS — R21 Rash and other nonspecific skin eruption: Secondary | ICD-10-CM | POA: Diagnosis not present

## 2019-10-17 DIAGNOSIS — E039 Hypothyroidism, unspecified: Secondary | ICD-10-CM | POA: Diagnosis not present

## 2019-10-17 DIAGNOSIS — Z951 Presence of aortocoronary bypass graft: Secondary | ICD-10-CM | POA: Diagnosis not present

## 2019-10-17 DIAGNOSIS — I2510 Atherosclerotic heart disease of native coronary artery without angina pectoris: Secondary | ICD-10-CM | POA: Diagnosis not present

## 2019-10-17 DIAGNOSIS — Z87891 Personal history of nicotine dependence: Secondary | ICD-10-CM | POA: Diagnosis not present

## 2019-11-15 DIAGNOSIS — M79605 Pain in left leg: Secondary | ICD-10-CM | POA: Diagnosis not present

## 2019-11-15 DIAGNOSIS — Z5112 Encounter for antineoplastic immunotherapy: Secondary | ICD-10-CM | POA: Diagnosis not present

## 2019-11-15 DIAGNOSIS — C642 Malignant neoplasm of left kidney, except renal pelvis: Secondary | ICD-10-CM | POA: Diagnosis not present

## 2019-11-15 DIAGNOSIS — M79604 Pain in right leg: Secondary | ICD-10-CM | POA: Diagnosis not present

## 2019-12-05 ENCOUNTER — Other Ambulatory Visit: Payer: Self-pay | Admitting: Cardiovascular Disease

## 2019-12-06 MED ORDER — METOPROLOL TARTRATE 100 MG PO TABS: 100.0000 mg | ORAL_TABLET | Freq: Two times a day (BID) | ORAL | 0 refills | Status: DC

## 2019-12-12 DIAGNOSIS — Z951 Presence of aortocoronary bypass graft: Secondary | ICD-10-CM | POA: Diagnosis not present

## 2019-12-12 DIAGNOSIS — E039 Hypothyroidism, unspecified: Secondary | ICD-10-CM | POA: Diagnosis not present

## 2019-12-12 DIAGNOSIS — C642 Malignant neoplasm of left kidney, except renal pelvis: Secondary | ICD-10-CM | POA: Diagnosis not present

## 2019-12-12 DIAGNOSIS — Z5112 Encounter for antineoplastic immunotherapy: Secondary | ICD-10-CM | POA: Diagnosis not present

## 2019-12-12 DIAGNOSIS — R21 Rash and other nonspecific skin eruption: Secondary | ICD-10-CM | POA: Diagnosis not present

## 2019-12-12 DIAGNOSIS — Z87891 Personal history of nicotine dependence: Secondary | ICD-10-CM | POA: Diagnosis not present

## 2019-12-12 DIAGNOSIS — I2510 Atherosclerotic heart disease of native coronary artery without angina pectoris: Secondary | ICD-10-CM | POA: Diagnosis not present

## 2019-12-12 DIAGNOSIS — E119 Type 2 diabetes mellitus without complications: Secondary | ICD-10-CM | POA: Diagnosis not present

## 2019-12-12 DIAGNOSIS — Z79899 Other long term (current) drug therapy: Secondary | ICD-10-CM | POA: Diagnosis not present

## 2019-12-12 DIAGNOSIS — L299 Pruritus, unspecified: Secondary | ICD-10-CM | POA: Diagnosis not present

## 2019-12-12 DIAGNOSIS — M2550 Pain in unspecified joint: Secondary | ICD-10-CM | POA: Diagnosis not present

## 2019-12-12 DIAGNOSIS — R05 Cough: Secondary | ICD-10-CM | POA: Diagnosis not present

## 2019-12-12 DIAGNOSIS — R918 Other nonspecific abnormal finding of lung field: Secondary | ICD-10-CM | POA: Diagnosis not present

## 2019-12-19 ENCOUNTER — Other Ambulatory Visit: Payer: Self-pay

## 2019-12-19 ENCOUNTER — Ambulatory Visit (INDEPENDENT_AMBULATORY_CARE_PROVIDER_SITE_OTHER): Payer: Medicare Other | Admitting: Cardiovascular Disease

## 2019-12-19 ENCOUNTER — Encounter: Payer: Self-pay | Admitting: Cardiovascular Disease

## 2019-12-19 VITALS — BP 142/94 | HR 65 | Ht 68.0 in | Wt 218.8 lb

## 2019-12-19 DIAGNOSIS — I25119 Atherosclerotic heart disease of native coronary artery with unspecified angina pectoris: Secondary | ICD-10-CM

## 2019-12-19 DIAGNOSIS — E782 Mixed hyperlipidemia: Secondary | ICD-10-CM | POA: Diagnosis not present

## 2019-12-19 MED ORDER — ISOSORBIDE MONONITRATE ER 120 MG PO TB24: 120.0000 mg | ORAL_TABLET | Freq: Every day | ORAL | 3 refills | Status: DC

## 2019-12-19 NOTE — Progress Notes (Signed)
Cardiology Office Note:    Date:  12/23/2019   ID:  Sheri Hernandez, DOB 1955-11-06, MRN [id number]  PCP:  Susy Frizzle, MD  Cardiologist:  Sherren Mocha, MD  Electrophysiologist:  None   Referring MD: Susy Frizzle, MD   Chief Complaint  Patient presents with  . Coronary Artery Disease    History of Present Illness:    Sheri Hernandez is a 64 y.o. female with a hx of CAD and chronic angina.   She presented with non-STEMI in 2016 and underwent complex PCI of severe stenosis in the right coronary artery complicated by extensive dissection and acute inferior STEMI. She required emergency CABG with a saphenous vein graft to the right PDA and PLA. She had recurrent angina with cardiac catheterization demonstrating occlusion of her bypass graft. Her RCA remained patent with residual tight stenosis of the RCA with medical therapy recommended.  She is here alone today. She's been under a lot of stress considering change in treatment related to her renal cell CA. Chest pain symptoms are stable, occurring with modest exertion such as walking up a grade. No resting chest pain. DOE also stable. No orthopnea, PND.   Past Medical History:  Diagnosis Date  . Anxiety   . Arthritis    "maybe in my right hand" (05/26/2017)  . CAD (coronary artery disease)    a. 12/2014 Cath: LM nl, LAD 20p, 22m, D2 20ost, LCX 95p, 28m, RCA dominant, 95p, 50m/d, RPDA 95, RPL2 50, attempted RCA PCI w/ acute RCA occlusion-->CABG x 2 VG->RPDA->PLVB.  Marland Kitchen Chronic diastolic CHF (congestive heart failure) ([place])   . Hypertension   . Hypoalbuminemia   . Hypothyroidism   . Lower extremity edema   . Lung mass    LUNG MASSES--COUGH-BIOPSY NON-DIAGNOSTIC.  HX OF MEDIASTINAL MASS AND LUNG MASSES AGE 56 -NEGATIVE BIOSPY-BUT GIVEN DIAGNOSIS OF SARCOIDOSIS.  WORK UP CONTINUING ON DIAGNOSIS FOR PT'S LUNG MASSES--SHE HAS BIOPSY PROVEN KIDNEY CANCER  . Myocardial infarction ([place]) 12/2014 X 2  . 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  . Papilloma of breast 2012   left. lumpectomy  . Pulmonary nodules   . Renal cell cancer [facility])    metastatic renal cell carcinoma (biopsy of left renal mass 10/2012) On nivolumab at [facility]  . S/p nephrectomy    left, due to metastatic RCC, 3/14    Past Surgical History:  Procedure Laterality Date  . BREAST BIOPSY  08/18/2011   Procedure: BREAST BIOPSY WITH NEEDLE LOCALIZATION;  Surgeon: Merrie Roof, MD;  Location: [facility];  Service: General;  Laterality: Left;  left breast biopsy with needle localization  . BRONCHIAL BRUSH BIOPSY  2014  . CARDIAC CATHETERIZATION    . CORONARY ARTERY BYPASS GRAFT N/A 01/02/2015   Procedure: CORONARY ARTERY BYPASS GRAFTING (CABG)TIMES TWO USING RIGHT GREATER SAPHENOUS LEG VEIN HARVESTED ENDOSCOPICALLY;  Surgeon: [name], MD;  Location: [facility];  Service: Open Heart Surgery;  Laterality: N/A;  . LAPAROSCOPIC NEPHRECTOMY Left 11/22/2012   Procedure: LAPAROSCOPIC NEPHRECTOMY;  Surgeon: Dutch Gray, MD;  Location: [facility];  Service: Urology;  Laterality: Left;  . LEFT HEART CATH AND CORS/GRAFTS ANGIOGRAPHY N/A 10/11/2016   Procedure: Left Heart Cath and Cors/Grafts Angiography;  Surgeon: Sherren Mocha, MD;  Location: [facility] CV [facility];  Service: Cardiovascular;  Laterality: N/A;  . LEFT HEART CATHETERIZATION WITH CORONARY ANGIOGRAM N/A 01/02/2015   Procedure: LEFT HEART CATHETERIZATION WITH CORONARY ANGIOGRAM;  Surgeon: Leonie Man, MD;  Location: [facility] CATH LAB;  Service: Cardiovascular;  Laterality: N/A;  . NECK LESION BIOPSY Left 08/2016  . PORTA CATH INSERTION Right ~ 2017  . TONSILLECTOMY  1967  . VIDEO BRONCHOSCOPY  10/11/2012   Procedure: VIDEO BRONCHOSCOPY WITH FLUORO;  Surgeon: Kathee Delton, MD;  Location: [facility];  Service:  Cardiopulmonary;  Laterality: Bilateral;  . WIDS    . WISDOM TOOTH EXTRACTION     "all 4"    Current Medications: Current Meds  Medication Sig  . Acetaminophen (TYLENOL PO) Take 2 tablets by mouth every 6 (six) hours as needed (headache).  Marland Kitchen aspirin 81 MG EC tablet Take 1 tablet (81 mg total) by mouth daily.  Marland Kitchen atorvastatin (LIPITOR) 40 MG tablet TAKE 1 TABLET (40 MG TOTAL) BY MOUTH DAILY.  . benzonatate (TESSALON) 100 MG capsule Take 1 capsule (100 mg total) by mouth 3 (three) times daily as needed for cough.  . Cholecalciferol (VITAMIN D PO) Take 1 tablet by mouth daily.  . clopidogrel (PLAVIX) 75 MG tablet TAKE 1 TABLET BY MOUTH EVERY DAY  . furosemide (LASIX) 40 MG tablet Take 1 tablet (40 mg total) by mouth daily.  Marland Kitchen HYDROcodone-acetaminophen (NORCO/VICODIN) 5-325 MG tablet Take 1 tablet by mouth as needed.  . isosorbide mononitrate (IMDUR) 120 MG 24 hr tablet Take 1 tablet (120 mg total) by mouth daily.  Marland Kitchen levothyroxine (SYNTHROID) 150 MCG tablet TAKE 1 TABLET (137 MCG TOTAL) BY MOUTH DAILY BEFORE BREAKFAST.  Marland Kitchen lidocaine-prilocaine (EMLA) cream APPLY CREAM TO PORTACATH SITE APPROXIMATELY 1 HOUR BEFORE ACCESS AND COVER  . metoprolol tartrate (LOPRESSOR) 100 MG tablet Take 1 tablet (100 mg total) by mouth 2 (two) times daily. Please make yearly appt with Dr. Burt Knack for June for future refills. 1st attempt  . nitroGLYCERIN (NITROSTAT) 0.4 MG SL tablet PLACE 1 TABLET UNDER THE TONGUE EVERY 5 MINUTES AS NEEDED FOR CHEST PAIN  . Nivolumab (OPDIVO IV) Inject into the vein every 30 (thirty) days. Chemo therapy  . ondansetron (ZOFRAN ODT) 4 MG disintegrating tablet Take 1 tablet (4 mg total) by mouth every 8 (eight) hours as needed for nausea or vomiting.  Marland Kitchen oxycodone (OXY-IR) 5 MG capsule Take 5 mg by mouth daily as needed (severe pain). pain  . [DISCONTINUED] isosorbide mononitrate (IMDUR) 60 MG 24 hr tablet TAKE 1 TABLET BY MOUTH EVERY DAY     Allergies:   Silver   Social History    Socioeconomic History  . Marital status: Married    Spouse name: Not on file  . Number of children: 0  . Years of education: Not on file  . Highest education level: Not on file  Occupational History  . 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   Tobacco Use  . Smoking status: Former Smoker    Packs/day: 1.50    Years: 40.00    Pack years: 60.00    Types: Cigarettes    Quit date: 09/24/2012    Years since quitting: 7.2  . Smokeless tobacco: Never Used  Substance and Sexual Activity  . Alcohol use: Yes    Comment: 05/26/2017 "couple drinks/year"  . Drug use: No  . Sexual activity: Yes  Other Topics Concern  . Not on file  Social History Narrative  . Not on  file   Social Determinants of Health   Financial Resource Strain:   . Difficulty of Paying Living Expenses:   Food Insecurity:   . Worried About Charity fundraiser in the Last Year:   . Arboriculturist in the Last Year:   Transportation Needs:   . Film/video editor (Medical):   Marland Kitchen Lack of Transportation (Non-Medical):   Physical Activity:   . Days of Exercise per Week:   . Minutes of Exercise per Session:   Stress:   . Feeling of Stress :   Social Connections:   . Frequency of Communication with Friends and Family:   . Frequency of Social Gatherings with Friends and Family:   . Attends Religious Services:   . Active Member of Clubs or Organizations:   . Attends Archivist Meetings:   Marland Kitchen Marital Status:      Family History: The patient's family history includes Aortic dissection in her brother; Breast cancer in her maternal aunt; Cancer in her maternal grandfather; Colon cancer in her maternal aunt; Heart attack in her father; Heart disease in her brother, father, maternal grandmother, and mother; Skin cancer in her maternal uncle; Stomach cancer in her maternal uncle; Stroke in her brother.  ROS:   Please see the history of present illness.    All other systems reviewed and  are negative.  EKGs/Labs/Other Studies Reviewed:     EKG:  EKG is not ordered today.   Recent Labs: No results found for requested labs within last 8760 hours.  Recent Lipid Panel    Component Value Date/Time   CHOL 90 01/03/2015 0210   TRIG 77 01/03/2015 0210   HDL 15 (L) 01/03/2015 0210   CHOLHDL 6.0 01/03/2015 0210   VLDL 15 01/03/2015 0210   LDLCALC 60 01/03/2015 0210    Physical Exam:    VS:  BP (!) 142/94 (BP Location: Left Arm, Patient Position: Sitting, Cuff Size: Normal)   Pulse 65   Ht 5\' 8"  (1.727 m)   Wt 218 lb 12.8 oz (99.2 kg)   SpO2 96%   BMI 33.27 kg/m     Wt Readings from Last 3 Encounters:  12/19/19 218 lb 12.8 oz (99.2 kg)  03/07/19 213 lb (96.6 kg)  02/06/19 215 lb (97.5 kg)     GEN:  Well nourished, well developed in no acute distress HEENT: Normal NECK: No JVD; No carotid bruits LYMPHATICS: No lymphadenopathy CARDIAC: RRR, no murmurs, rubs, gallops RESPIRATORY:  Clear to auscultation without rales, wheezing or rhonchi  ABDOMEN: Soft, non-tender, non-distended MUSCULOSKELETAL:  No edema; No deformity  SKIN: Warm and dry NEUROLOGIC:  Alert and oriented x 3 PSYCHIATRIC:  Normal affect   ASSESSMENT:    1. Coronary artery disease involving native coronary artery of native heart with angina pectoris ([place])   2. Mixed hyperlipidemia    PLAN:    In order of problems listed above:  1. Remains limited by angina. Recommend increase imdur to 120 mg daily. Reviewed cath films and demonstrated findings to patient. Would not be unreasonable to consider repeat cath and possible PCI. The LCx has significant stenosis and the RCA is totally occluded. She is not ready to move forward with cath and will continue medical therapy for now. 2. Continue atorvastatin 40 mg. Will add a lipid panel to her next labs (drawn with oncology via port).   Medication Adjustments/Labs and Tests Ordered: Current medicines are reviewed at length with the patient today.   Concerns regarding  medicines are outlined above.  Orders Placed This Encounter  Procedures  . ECHOCARDIOGRAM COMPLETE   Meds ordered this encounter  Medications  . isosorbide mononitrate (IMDUR) 120 MG 24 hr tablet    Sig: Take 1 tablet (120 mg total) by mouth daily.    Dispense:  90 tablet    Refill:  3    Patient Instructions  Medication Instructions:  1) INCREASE IMDUR to 120 mg daily *If you need a refill on your cardiac medications before your next appointment, please call your pharmacy*  Testing/Procedures: Your provider has requested that you have an echocardiogram. Echocardiography is a painless test that uses sound waves to create images of your heart. It provides your doctor with information about the size and shape of your heart and how well your heart's chambers and valves are working. This procedure takes approximately one hour. There are no restrictions for this procedure.  Follow-Up: At [facility], you and your health needs are our priority.  As part of our continuing mission to provide you with exceptional heart care, we have created designated Provider Care Teams.  These Care Teams include your primary Cardiologist (physician) and Advanced Practice Providers (APPs -  Physician Assistants and Nurse Practitioners) who all work together to provide you with the care you need, when you need it. Your next appointment:   4 month(s) The format for your next appointment:   In Person Provider:    You may see Sherren Mocha, MD      Signed, Sherren Mocha, MD  12/23/2019 6:49 AM    [place]

## 2019-12-19 NOTE — Patient Instructions (Signed)
Medication Instructions:  1) INCREASE IMDUR to 120 mg daily *If you need a refill on your cardiac medications before your next appointment, please call your pharmacy*  Testing/Procedures: Your provider has requested that you have an echocardiogram. Echocardiography is a painless test that uses sound waves to create images of your heart. It provides your doctor with information about the size and shape of your heart and how well your heart's chambers and valves are working. This procedure takes approximately one hour. There are no restrictions for this procedure.  Follow-Up: At [facility], you and your health needs are our priority.  As part of our continuing mission to provide you with exceptional heart care, we have created designated Provider Care Teams.  These Care Teams include your primary Cardiologist (physician) and Advanced Practice Providers (APPs -  Physician Assistants and Nurse Practitioners) who all work together to provide you with the care you need, when you need it. Your next appointment:   4 month(s) The format for your next appointment:   In Person Provider:    You may see Sherren Mocha, MD

## 2019-12-24 ENCOUNTER — Other Ambulatory Visit: Payer: Self-pay

## 2019-12-24 ENCOUNTER — Telehealth: Payer: Self-pay

## 2019-12-24 DIAGNOSIS — E782 Mixed hyperlipidemia: Secondary | ICD-10-CM

## 2019-12-24 NOTE — Telephone Encounter (Signed)
Per Dr. Burt Knack, informed patient she will need cholesterol drawn at her next visit at [facility]. Lipid order printed and mailed to patient. She was grateful for call and agrees with treatment plan.

## 2020-01-09 DIAGNOSIS — C642 Malignant neoplasm of left kidney, except renal pelvis: Secondary | ICD-10-CM | POA: Diagnosis not present

## 2020-01-09 DIAGNOSIS — E785 Hyperlipidemia, unspecified: Secondary | ICD-10-CM | POA: Diagnosis not present

## 2020-01-09 DIAGNOSIS — Z5112 Encounter for antineoplastic immunotherapy: Secondary | ICD-10-CM | POA: Diagnosis not present

## 2020-01-16 ENCOUNTER — Ambulatory Visit (HOSPITAL_COMMUNITY): Payer: Medicare Other | Attending: Cardiovascular Disease

## 2020-01-16 ENCOUNTER — Other Ambulatory Visit: Payer: Self-pay

## 2020-01-16 DIAGNOSIS — I25119 Atherosclerotic heart disease of native coronary artery with unspecified angina pectoris: Secondary | ICD-10-CM | POA: Insufficient documentation

## 2020-01-24 ENCOUNTER — Other Ambulatory Visit: Payer: Self-pay | Admitting: Family Medicine

## 2020-02-06 DIAGNOSIS — J432 Centrilobular emphysema: Secondary | ICD-10-CM | POA: Diagnosis not present

## 2020-02-06 DIAGNOSIS — E039 Hypothyroidism, unspecified: Secondary | ICD-10-CM | POA: Diagnosis not present

## 2020-02-06 DIAGNOSIS — Z9221 Personal history of antineoplastic chemotherapy: Secondary | ICD-10-CM | POA: Diagnosis not present

## 2020-02-06 DIAGNOSIS — Z905 Acquired absence of kidney: Secondary | ICD-10-CM | POA: Diagnosis not present

## 2020-02-06 DIAGNOSIS — R52 Pain, unspecified: Secondary | ICD-10-CM | POA: Diagnosis not present

## 2020-02-06 DIAGNOSIS — Z9889 Other specified postprocedural states: Secondary | ICD-10-CM | POA: Diagnosis not present

## 2020-02-06 DIAGNOSIS — I2510 Atherosclerotic heart disease of native coronary artery without angina pectoris: Secondary | ICD-10-CM | POA: Diagnosis not present

## 2020-02-06 DIAGNOSIS — R21 Rash and other nonspecific skin eruption: Secondary | ICD-10-CM | POA: Diagnosis not present

## 2020-02-06 DIAGNOSIS — Z08 Encounter for follow-up examination after completed treatment for malignant neoplasm: Secondary | ICD-10-CM | POA: Diagnosis not present

## 2020-02-06 DIAGNOSIS — E1165 Type 2 diabetes mellitus with hyperglycemia: Secondary | ICD-10-CM | POA: Diagnosis not present

## 2020-02-06 DIAGNOSIS — R739 Hyperglycemia, unspecified: Secondary | ICD-10-CM | POA: Diagnosis not present

## 2020-02-06 DIAGNOSIS — L299 Pruritus, unspecified: Secondary | ICD-10-CM | POA: Diagnosis not present

## 2020-02-06 DIAGNOSIS — C642 Malignant neoplasm of left kidney, except renal pelvis: Secondary | ICD-10-CM | POA: Diagnosis not present

## 2020-02-11 DIAGNOSIS — M79622 Pain in left upper arm: Secondary | ICD-10-CM | POA: Diagnosis not present

## 2020-02-11 DIAGNOSIS — C642 Malignant neoplasm of left kidney, except renal pelvis: Secondary | ICD-10-CM | POA: Diagnosis not present

## 2020-02-11 DIAGNOSIS — Z5112 Encounter for antineoplastic immunotherapy: Secondary | ICD-10-CM | POA: Diagnosis not present

## 2020-03-29 ENCOUNTER — Other Ambulatory Visit: Payer: Self-pay | Admitting: Cardiovascular Disease

## 2020-04-09 DIAGNOSIS — E119 Type 2 diabetes mellitus without complications: Secondary | ICD-10-CM | POA: Diagnosis not present

## 2020-04-09 DIAGNOSIS — E032 Hypothyroidism due to medicaments and other exogenous substances: Secondary | ICD-10-CM | POA: Diagnosis not present

## 2020-04-09 DIAGNOSIS — Z905 Acquired absence of kidney: Secondary | ICD-10-CM | POA: Diagnosis not present

## 2020-04-09 DIAGNOSIS — Z79899 Other long term (current) drug therapy: Secondary | ICD-10-CM | POA: Diagnosis not present

## 2020-04-09 DIAGNOSIS — L299 Pruritus, unspecified: Secondary | ICD-10-CM | POA: Diagnosis not present

## 2020-04-09 DIAGNOSIS — E039 Hypothyroidism, unspecified: Secondary | ICD-10-CM | POA: Diagnosis not present

## 2020-04-09 DIAGNOSIS — C642 Malignant neoplasm of left kidney, except renal pelvis: Secondary | ICD-10-CM | POA: Diagnosis not present

## 2020-04-09 DIAGNOSIS — C7802 Secondary malignant neoplasm of left lung: Secondary | ICD-10-CM | POA: Diagnosis not present

## 2020-04-09 DIAGNOSIS — R21 Rash and other nonspecific skin eruption: Secondary | ICD-10-CM | POA: Diagnosis not present

## 2020-04-09 DIAGNOSIS — Z5112 Encounter for antineoplastic immunotherapy: Secondary | ICD-10-CM | POA: Diagnosis not present

## 2020-04-09 DIAGNOSIS — C786 Secondary malignant neoplasm of retroperitoneum and peritoneum: Secondary | ICD-10-CM | POA: Diagnosis not present

## 2020-04-09 DIAGNOSIS — I2510 Atherosclerotic heart disease of native coronary artery without angina pectoris: Secondary | ICD-10-CM | POA: Diagnosis not present

## 2020-04-09 DIAGNOSIS — Z951 Presence of aortocoronary bypass graft: Secondary | ICD-10-CM | POA: Diagnosis not present

## 2020-04-16 ENCOUNTER — Other Ambulatory Visit: Payer: Self-pay | Admitting: Cardiovascular Disease

## 2020-04-16 ENCOUNTER — Other Ambulatory Visit: Payer: Self-pay | Admitting: Family Medicine

## 2020-04-16 DIAGNOSIS — E785 Hyperlipidemia, unspecified: Secondary | ICD-10-CM

## 2020-04-16 DIAGNOSIS — I25708 Atherosclerosis of coronary artery bypass graft(s), unspecified, with other forms of angina pectoris: Secondary | ICD-10-CM

## 2020-04-16 NOTE — Telephone Encounter (Signed)
*  STAT* If patient is at the pharmacy, call can be transferred to refill team.   1. Which medications need to be refilled? (please list name of each medication and dose if known)  Nitroglycerin  2. Which pharmacy/location (including street and city if local pharmacy) is medication to be sent to? [facility] [address], [place]*  3. Do they need a 30 day or 90 day supply? Refills for a year

## 2020-04-27 ENCOUNTER — Encounter: Payer: Self-pay | Admitting: Cardiovascular Disease

## 2020-04-27 ENCOUNTER — Other Ambulatory Visit: Payer: Self-pay

## 2020-04-27 ENCOUNTER — Ambulatory Visit (INDEPENDENT_AMBULATORY_CARE_PROVIDER_SITE_OTHER): Payer: Medicare Other | Admitting: Cardiovascular Disease

## 2020-04-27 VITALS — BP 116/68 | HR 75 | Ht 68.0 in | Wt 221.4 lb

## 2020-04-27 DIAGNOSIS — E782 Mixed hyperlipidemia: Secondary | ICD-10-CM | POA: Diagnosis not present

## 2020-04-27 DIAGNOSIS — I10 Essential (primary) hypertension: Secondary | ICD-10-CM

## 2020-04-27 DIAGNOSIS — I25708 Atherosclerosis of coronary artery bypass graft(s), unspecified, with other forms of angina pectoris: Secondary | ICD-10-CM | POA: Diagnosis not present

## 2020-04-27 DIAGNOSIS — I25119 Atherosclerotic heart disease of native coronary artery with unspecified angina pectoris: Secondary | ICD-10-CM | POA: Diagnosis not present

## 2020-04-27 NOTE — Patient Instructions (Signed)
Medication Instructions:  1) Dr. Burt Knack recommends you START ZETIA 10 mg daily. Please let us know when you would like this called in for you. *If you need a refill on your cardiac medications before your next appointment, please call your pharmacy*  Lab Work: If you decide to start Zetia, we will check your cholesterol 3 or so months later.  Follow-Up: At [facility], you and your health needs are our priority.  As part of our continuing mission to provide you with exceptional heart care, we have created designated Provider Care Teams.  These Care Teams include your primary Cardiologist (physician) and Advanced Practice Providers (APPs -  Physician Assistants and Nurse Practitioners) who all work together to provide you with the care you need, when you need it. Your next appointment:   6 month(s) The format for your next appointment:   In Person Provider:   You may see Sherren Mocha, MD or one of the following Advanced Practice Providers on your designated Care Team:    Richardson Dopp, PA-C  Vin McKinnon, [place]

## 2020-04-27 NOTE — Progress Notes (Signed)
Cardiology Office Note:    Date:  04/27/2020   ID:  Sheri Hernandez, DOB Jan 04, 1956, MRN [id number]  PCP:  Susy Frizzle, MD  [facility] Cardiologist:  Sherren Mocha, MD  [place] Electrophysiologist:  None   Referring MD: Susy Frizzle, MD   Chief Complaint  Patient presents with  . Coronary Artery Disease    History of Present Illness:    Sheri Hernandez is a 64 y.o. female with a hx of CAD and chronic angina, presenting for follow-up evaluation.   She presented with non-STEMI in 2016 and underwent complex PCI of severe stenosis in the right coronary artery complicated by extensive dissection and acute inferior STEMI. She required emergency CABG with a saphenous vein graft to the right PDA and PLA. She had recurrent angina with cardiac catheterization demonstrating occlusion of her bypass graft. Her RCA remained patent with residual tight stenosis of the RCA with medical therapy recommended.  She is here alone today. She reports that eating a meal is her biggest anginal trigger at present. After eating, she complains of chest discomfort with walking or exerting herself. Overall angina pattern is less severe than it has been in the past. Reports no big problems with dyspnea. No orthopnea or PND.   Past Medical History:  Diagnosis Date  . Acute blood loss anemia 01/12/2015  . Adenocarcinoma, renal cell ([place]) 11/12/2012   Overview:   09/25/12 - CXR - lung masses  CT CAP - rt hilar mass, mult pulm nodules, LLL mass 3 x 3.1 cm, RLL mass 2.2 x 2.0 cm, left kidney mass 12.4 x 11.1 x 10.3 cm  10/05/12 PET - left lung mass 30 mm SUV 16.7, rt lung nodule 21 mm, SUV 10.4, left kidney mass 7 cm SUV 18, mass compressing RLL bronchus  10/11/12 lung biopsy - atypical cells  10/23/12 left kidney biopsy - high gr clear cell RCC   . Anxiety   . Arthritis    "maybe in my right hand" (05/26/2017)  . CAD (coronary artery disease)    a. 12/2014 Cath: LM nl, LAD 20p, 28m, D2 20ost, LCX  95p, 60m, RCA dominant, 95p, 73m/d, RPDA 95, RPL2 50, attempted RCA PCI w/ acute RCA occlusion-->CABG x 2 VG->RPDA->PLVB.  Marland Kitchen Chronic diastolic CHF (congestive heart failure) ([place])   . Drug-induced hypothyroidism 03/13/2014  . GERD (gastroesophageal reflux disease) 03/18/2015  . Hyperglycemia 01/12/2015  . Hypertension   . Hypoalbuminemia   . Hypothyroidism   . Lower extremity edema   . Lung mass    LUNG MASSES--COUGH-BIOPSY NON-DIAGNOSTIC.  HX OF MEDIASTINAL MASS AND LUNG MASSES AGE 21 -NEGATIVE BIOSPY-BUT GIVEN DIAGNOSIS OF SARCOIDOSIS.  WORK UP CONTINUING ON DIAGNOSIS FOR PT'S LUNG MASSES--SHE HAS BIOPSY PROVEN KIDNEY CANCER  . Mass of left breast 08/01/2011  . Myocardial infarction ([place]) 12/2014 X 2  . 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  . Papilloma of breast 2012   left. lumpectomy  . Pulmonary nodules   . Renal cell cancer [facility])    metastatic renal cell carcinoma (biopsy of left renal mass 10/2012) On nivolumab at [facility]  . Renal mass 10/01/2012   10/23/12 Bx Pos high grad clear cell renal cell ca> referred to oncology 10/26/2012    . S/p nephrectomy  left, due to metastatic RCC, 3/14    Past Surgical History:  Procedure Laterality Date  . BREAST BIOPSY  08/18/2011   Procedure: BREAST BIOPSY WITH NEEDLE LOCALIZATION;  Surgeon: Merrie Roof, MD;  Location: [place];  Service: General;  Laterality: Left;  left breast biopsy with needle localization  . BRONCHIAL BRUSH BIOPSY  2014  . CARDIAC CATHETERIZATION    . CORONARY ARTERY BYPASS GRAFT N/A 01/02/2015   Procedure: CORONARY ARTERY BYPASS GRAFTING (CABG)TIMES TWO USING RIGHT GREATER SAPHENOUS LEG VEIN HARVESTED ENDOSCOPICALLY;  Surgeon: [name], MD;  Location: [place];  Service: Open Heart Surgery;  Laterality: N/A;  .  LAPAROSCOPIC NEPHRECTOMY Left 11/22/2012   Procedure: LAPAROSCOPIC NEPHRECTOMY;  Surgeon: Dutch Gray, MD;  Location: [facility];  Service: Urology;  Laterality: Left;  . LEFT HEART CATH AND CORS/GRAFTS ANGIOGRAPHY N/A 10/11/2016   Procedure: Left Heart Cath and Cors/Grafts Angiography;  Surgeon: Sherren Mocha, MD;  Location: [place] CV LAB;  Service: Cardiovascular;  Laterality: N/A;  . LEFT HEART CATHETERIZATION WITH CORONARY ANGIOGRAM N/A 01/02/2015   Procedure: LEFT HEART CATHETERIZATION WITH CORONARY ANGIOGRAM;  Surgeon: Leonie Man, MD;  Location: [facility] CATH LAB;  Service: Cardiovascular;  Laterality: N/A;  . NECK LESION BIOPSY Left 08/2016  . PORTA CATH INSERTION Right ~ 2017  . TONSILLECTOMY  1967  . VIDEO BRONCHOSCOPY  10/11/2012   Procedure: VIDEO BRONCHOSCOPY WITH FLUORO;  Surgeon: Kathee Delton, MD;  Location: [facility];  Service: Cardiopulmonary;  Laterality: Bilateral;  . WIDS    . WISDOM TOOTH EXTRACTION     "all 4"    Current Medications: Current Meds  Medication Sig  . Acetaminophen (TYLENOL PO) Take 2 tablets by mouth every 6 (six) hours as needed (headache).  Marland Kitchen aspirin 81 MG EC tablet Take 1 tablet (81 mg total) by mouth daily.  Marland Kitchen atorvastatin (LIPITOR) 40 MG tablet TAKE 1 TABLET (40 MG TOTAL) BY MOUTH DAILY.  . benzonatate (TESSALON) 100 MG capsule Take 1 capsule (100 mg total) by mouth 3 (three) times daily as needed for cough.  . Cholecalciferol (VITAMIN D PO) Take 1 tablet by mouth daily.  . clopidogrel (PLAVIX) 75 MG tablet TAKE 1 TABLET BY MOUTH EVERY DAY  . furosemide (LASIX) 40 MG tablet Take 1 tablet (40 mg total) by mouth daily.  Marland Kitchen HYDROcodone-acetaminophen (NORCO/VICODIN) 5-325 MG tablet Take 1 tablet by mouth every 6 (six) hours as needed.  . isosorbide mononitrate (IMDUR) 120 MG 24 hr tablet Take 1 tablet (120 mg total) by mouth daily.  Marland Kitchen levothyroxine (SYNTHROID) 150 MCG tablet TAKE 1 TABLET (137 MCG TOTAL) BY MOUTH DAILY BEFORE BREAKFAST.  Marland Kitchen  lidocaine-prilocaine (EMLA) cream APPLY CREAM TO PORTACATH SITE APPROXIMATELY 1 HOUR BEFORE ACCESS AND COVER  . metoprolol tartrate (LOPRESSOR) 100 MG tablet TAKE 1 TABLET BY MOUTH 2 TIMES DAILY. MAKE YEARLY APPT WITH DR. Burt Knack FOR JUNE FOR FUTURE REFILLS.  Marland Kitchen nitroGLYCERIN (NITROSTAT) 0.4 MG SL tablet PLACE 1 TABLET UNDER THE TONGUE EVERY 5 MINUTES AS NEEDED FOR CHEST PAIN  . Nivolumab (OPDIVO IV) Inject into the vein every 30 (thirty) days. Chemo therapy  . ondansetron (ZOFRAN ODT) 4 MG disintegrating tablet Take 1 tablet (4 mg total) by mouth every 8 (eight) hours as needed for nausea or vomiting.  Marland Kitchen oxycodone (OXY-IR) 5 MG capsule Take 5 mg by mouth daily as needed (severe pain). pain     Allergies:   Silver   Social History   Socioeconomic History  . Marital status:  Married    Spouse name: Not on file  . Number of children: 0  . Years of education: Not on file  . Highest education level: Not on file  Occupational History  . 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   Tobacco Use  . Smoking status: Former Smoker    Packs/day: 1.50    Years: 40.00    Pack years: 60.00    Types: Cigarettes    Quit date: 09/24/2012    Years since quitting: 7.5  . Smokeless tobacco: Never Used  Vaping Use  . Vaping Use: Never used  Substance and Sexual Activity  . Alcohol use: Yes    Comment: 05/26/2017 "couple drinks/year"  . Drug use: No  . Sexual activity: Yes  Other Topics Concern  . Not on file  Social History Narrative  . Not on file   Social Determinants of Health   Financial Resource Strain:   . Difficulty of Paying Living Expenses: Not on file  Food Insecurity:   . Worried About Charity fundraiser in the Last Year: Not on file  . Ran Out of Food in the Last Year: Not on file  Transportation Needs:   . Lack of Transportation (Medical): Not on file  . Lack of Transportation (Non-Medical): Not on file  Physical Activity:   . Days of Exercise per Week: Not  on file  . Minutes of Exercise per Session: Not on file  Stress:   . Feeling of Stress : Not on file  Social Connections:   . Frequency of Communication with Friends and Family: Not on file  . Frequency of Social Gatherings with Friends and Family: Not on file  . Attends Religious Services: Not on file  . Active Member of Clubs or Organizations: Not on file  . Attends Archivist Meetings: Not on file  . Marital Status: Not on file     Family History: The patient's family history includes Aortic dissection in her brother; Breast cancer in her maternal aunt; Cancer in her maternal grandfather; Colon cancer in her maternal aunt; Heart attack in her father; Heart disease in her brother, father, maternal grandmother, and mother; Skin cancer in her maternal uncle; Stomach cancer in her maternal uncle; Stroke in her brother.  ROS:   Please see the history of present illness.    All other systems reviewed and are negative.  EKGs/Labs/Other Studies Reviewed:    The following studies were reviewed today: Echo 01/16/2020: IMPRESSIONS    1. Left ventricular ejection fraction, by estimation, is 60 to 65%. The  left ventricle has normal function. The left ventricle has no regional  wall motion abnormalities. There is mild concentric left ventricular  hypertrophy. Left ventricular diastolic  parameters are consistent with Grade I diastolic dysfunction (impaired  relaxation). Elevated left ventricular end-diastolic pressure.  2. Right ventricular systolic function is normal. The right ventricular  size is normal. There is normal pulmonary artery systolic pressure.  3. The mitral valve is normal in structure. Trivial mitral valve  regurgitation. No evidence of mitral stenosis.  4. The aortic valve is normal in structure. Aortic valve regurgitation is  not visualized. No aortic stenosis is present.  5. Aortic dilatation noted. There is mild dilatation of the ascending  aorta  measuring 38 mm.  6. The inferior vena cava is normal in size with greater than 50%  respiratory variability, suggesting right atrial pressure of 3 mmHg.   EKG:  EKG is ordered today.  The ekg ordered today demonstrates normal sinus rhythm 75 bpm, age-indeterminate inferior infarct, no significant change from previous tracings.  Recent Labs: No results found for requested labs within last 8760 hours.  Recent Lipid Panel    Component Value Date/Time   CHOL 90 01/03/2015 0210   TRIG 77 01/03/2015 0210   HDL 15 (L) 01/03/2015 0210   CHOLHDL 6.0 01/03/2015 0210   VLDL 15 01/03/2015 0210   LDLCALC 60 01/03/2015 0210    Physical Exam:    VS:  BP 116/68   Pulse 75   Ht 5\' 8"  (1.727 m)   Wt 221 lb 6.4 oz (100.4 kg)   SpO2 95%   BMI 33.66 kg/m     Wt Readings from Last 3 Encounters:  04/27/20 221 lb 6.4 oz (100.4 kg)  12/19/19 218 lb 12.8 oz (99.2 kg)  03/07/19 213 lb (96.6 kg)     GEN:  Well nourished, well developed in no acute distress HEENT: Normal NECK: No JVD; No carotid bruits LYMPHATICS: No lymphadenopathy CARDIAC: RRR, no murmurs, rubs, gallops RESPIRATORY:  Clear to auscultation without rales, wheezing or rhonchi  ABDOMEN: Soft, non-tender, non-distended MUSCULOSKELETAL:  No edema; No deformity  SKIN: Warm and dry NEUROLOGIC:  Alert and oriented x 3 PSYCHIATRIC:  Normal affect   ASSESSMENT:    1. Coronary artery disease involving native coronary artery of native heart with angina pectoris ([place])   2. Mixed hyperlipidemia   3. Essential hypertension    PLAN:    In order of problems listed above:  1. Medical program reviewed and looks good. Continue ASA/clopidogrel, imdur 120 mg daily, metoprolol 100 mg BID.  I reviewed her echo as outlined above that demonstrates normal LV systolic function with LVEF 65% and no regional wall motion abnormalities.  Talked about meal management, avoidance of large meals or exercise after meals.  2. Treated with atorvastatin.   Lipids reviewed with LDL above goal of less than 70 mg/dL.  Advised and ezetimibe 10 mg daily.  Patient is not sure she wants to take another medication.  We have written the name of this medicine down and she will go home and research the pros and cons.  She will contact us if she wishes to start it. 3. Blood pressure well controlled on current medical therapy with a reading of 116/68 today.   Medication Adjustments/Labs and Tests Ordered: Current medicines are reviewed at length with the patient today.  Concerns regarding medicines are outlined above.  No orders of the defined types were placed in this encounter.  No orders of the defined types were placed in this encounter.   There are no Patient Instructions on file for this visit.   Signed, Sherren Mocha, MD  04/27/2020 1:58 PM    [place]

## 2020-05-08 ENCOUNTER — Other Ambulatory Visit: Payer: Self-pay | Admitting: Cardiovascular Disease

## 2020-05-08 DIAGNOSIS — I2510 Atherosclerotic heart disease of native coronary artery without angina pectoris: Secondary | ICD-10-CM

## 2020-05-08 DIAGNOSIS — R609 Edema, unspecified: Secondary | ICD-10-CM

## 2020-05-08 DIAGNOSIS — R002 Palpitations: Secondary | ICD-10-CM

## 2020-05-21 ENCOUNTER — Emergency Department (HOSPITAL_COMMUNITY): Payer: Medicare Other

## 2020-05-21 ENCOUNTER — Other Ambulatory Visit: Payer: Self-pay

## 2020-05-21 ENCOUNTER — Emergency Department (HOSPITAL_COMMUNITY)
Admission: EM | Admit: 2020-05-21 | Discharge: 2020-05-22 | Disposition: A | Discharge status: Home / Self Care | Payer: Medicare Other | Attending: Emergency Medicine | Admitting: Emergency Medicine

## 2020-05-21 ENCOUNTER — Encounter (HOSPITAL_COMMUNITY): Payer: Self-pay

## 2020-05-21 DIAGNOSIS — R0602 Shortness of breath: Secondary | ICD-10-CM | POA: Diagnosis not present

## 2020-05-21 DIAGNOSIS — Z79899 Other long term (current) drug therapy: Secondary | ICD-10-CM | POA: Insufficient documentation

## 2020-05-21 DIAGNOSIS — Z87891 Personal history of nicotine dependence: Secondary | ICD-10-CM | POA: Diagnosis not present

## 2020-05-21 DIAGNOSIS — R079 Chest pain, unspecified: Secondary | ICD-10-CM | POA: Diagnosis not present

## 2020-05-21 DIAGNOSIS — I110 Hypertensive heart disease with heart failure: Secondary | ICD-10-CM | POA: Diagnosis not present

## 2020-05-21 DIAGNOSIS — R0689 Other abnormalities of breathing: Secondary | ICD-10-CM | POA: Diagnosis not present

## 2020-05-21 DIAGNOSIS — I10 Essential (primary) hypertension: Secondary | ICD-10-CM

## 2020-05-21 DIAGNOSIS — Z9012 Acquired absence of left breast and nipple: Secondary | ICD-10-CM | POA: Diagnosis not present

## 2020-05-21 DIAGNOSIS — I25810 Atherosclerosis of coronary artery bypass graft(s) without angina pectoris: Secondary | ICD-10-CM | POA: Insufficient documentation

## 2020-05-21 DIAGNOSIS — E039 Hypothyroidism, unspecified: Secondary | ICD-10-CM | POA: Diagnosis not present

## 2020-05-21 DIAGNOSIS — I5032 Chronic diastolic (congestive) heart failure: Secondary | ICD-10-CM | POA: Diagnosis not present

## 2020-05-21 DIAGNOSIS — Z79890 Hormone replacement therapy (postmenopausal): Secondary | ICD-10-CM | POA: Diagnosis not present

## 2020-05-21 DIAGNOSIS — R0789 Other chest pain: Secondary | ICD-10-CM | POA: Diagnosis not present

## 2020-05-21 DIAGNOSIS — Z7982 Long term (current) use of aspirin: Secondary | ICD-10-CM | POA: Diagnosis not present

## 2020-05-21 DIAGNOSIS — R072 Precordial pain: Secondary | ICD-10-CM | POA: Diagnosis not present

## 2020-05-21 DIAGNOSIS — R110 Nausea: Secondary | ICD-10-CM | POA: Insufficient documentation

## 2020-05-21 DIAGNOSIS — F419 Anxiety disorder, unspecified: Secondary | ICD-10-CM | POA: Insufficient documentation

## 2020-05-21 DIAGNOSIS — J90 Pleural effusion, not elsewhere classified: Secondary | ICD-10-CM | POA: Diagnosis not present

## 2020-05-21 DIAGNOSIS — R0902 Hypoxemia: Secondary | ICD-10-CM | POA: Diagnosis not present

## 2020-05-21 DIAGNOSIS — I499 Cardiac arrhythmia, unspecified: Secondary | ICD-10-CM | POA: Diagnosis not present

## 2020-05-21 NOTE — ED Triage Notes (Addendum)
Pt arrives to ED w/ c/o intermittent chest pain x 2 days. Pt denies n/v, sob. Pt has cardiac hx and stage 4 CKD. EMS EKG unremarkable, EMS VSS. Pt resp e/u. Pt received 3 nitro and 324 mg aspirin w/ EMS w/ no improvement in pain.

## 2020-05-22 ENCOUNTER — Telehealth: Payer: Self-pay | Admitting: Cardiovascular Disease

## 2020-05-22 DIAGNOSIS — R072 Precordial pain: Secondary | ICD-10-CM | POA: Diagnosis not present

## 2020-05-22 LAB — COMPREHENSIVE METABOLIC PANEL
ALT: 23 U/L (ref 0–44)
AST: 21 U/L (ref 15–41)
Albumin: 3.9 g/dL (ref 3.5–5.0)
Alkaline Phosphatase: 85 U/L (ref 38–126)
Anion gap: 10 (ref 5–15)
BUN: 12 mg/dL (ref 8–23)
CO2: 25 mmol/L (ref 22–32)
Calcium: 9.7 mg/dL (ref 8.9–10.3)
Chloride: 105 mmol/L (ref 98–111)
Creatinine, Ser: 0.78 mg/dL (ref 0.44–1.00)
GFR calc Af Amer: >60 mL/min (ref >60)
GFR calc non Af Amer: >60 mL/min (ref >60)
Glucose, Bld: 157 mg/dL — ABNORMAL HIGH (ref 70–99)
Potassium: 4 mmol/L (ref 3.5–5.1)
Sodium: 140 mmol/L (ref 135–145)
Total Bilirubin: 0.8 mg/dL (ref 0.3–1.2)
Total Protein: 7.8 g/dL (ref 6.5–8.1)

## 2020-05-22 LAB — I-STAT CHEM 8, ED
BUN: 17 mg/dL (ref 8–23)
Calcium, Ion: 1.19 mmol/L (ref 1.15–1.40)
Chloride: 104 mmol/L (ref 98–111)
Creatinine, Ser: 0.7 mg/dL (ref 0.44–1.00)
Glucose, Bld: 155 mg/dL — ABNORMAL HIGH (ref 70–99)
HCT: 47 % — ABNORMAL HIGH (ref 36.0–46.0)
Hemoglobin: 16 g/dL — ABNORMAL HIGH (ref 12.0–15.0)
Potassium: 4.7 mmol/L (ref 3.5–5.1)
Sodium: 143 mmol/L (ref 135–145)
TCO2: 27 mmol/L (ref 22–32)

## 2020-05-22 LAB — MAGNESIUM: Magnesium: 2.1 mg/dL (ref 1.7–2.4)

## 2020-05-22 LAB — CBC WITH DIFFERENTIAL/PLATELET
Abs Immature Granulocytes: 0.02 10*3/uL (ref 0.00–0.07)
Basophils Absolute: 0.1 10*3/uL (ref 0.0–0.1)
Basophils Relative: 1 %
Eosinophils Absolute: 0.1 10*3/uL (ref 0.0–0.5)
Eosinophils Relative: 1 %
HCT: 46.4 % — ABNORMAL HIGH (ref 36.0–46.0)
Hemoglobin: 15.1 g/dL — ABNORMAL HIGH (ref 12.0–15.0)
Immature Granulocytes: 0 %
Lymphocytes Relative: 23 %
Lymphs Abs: 2.6 10*3/uL (ref 0.7–4.0)
MCH: 29 pg (ref 26.0–34.0)
MCHC: 32.5 g/dL (ref 30.0–36.0)
MCV: 89.2 fL (ref 80.0–100.0)
Monocytes Absolute: 1.3 10*3/uL — ABNORMAL HIGH (ref 0.1–1.0)
Monocytes Relative: 11 %
Neutro Abs: 7.4 10*3/uL (ref 1.7–7.7)
Neutrophils Relative %: 64 %
Platelets: 343 10*3/uL (ref 150–400)
RBC: 5.2 MIL/uL — ABNORMAL HIGH (ref 3.87–5.11)
RDW: 15.8 % — ABNORMAL HIGH (ref 11.5–15.5)
WBC: 11.6 10*3/uL — ABNORMAL HIGH (ref 4.0–10.5)
nRBC: 0 % (ref 0.0–0.2)

## 2020-05-22 LAB — TROPONIN I (HIGH SENSITIVITY)
Troponin I (High Sensitivity): 11 ng/L (ref <18)
Troponin I (High Sensitivity): 11 ng/L (ref <18)

## 2020-05-22 LAB — D-DIMER, QUANTITATIVE: D-Dimer, Quant: 0.48 ug/mL-FEU (ref 0.00–0.50)

## 2020-05-22 LAB — TSH: TSH: 0.503 u[IU]/mL (ref 0.350–4.500)

## 2020-05-22 MED ORDER — AMLODIPINE BESYLATE 5 MG PO TABS: 5.0000 mg | ORAL_TABLET | Freq: Every evening | ORAL | 0 refills | Status: DC

## 2020-05-22 MED ORDER — AMLODIPINE BESYLATE 5 MG PO TABS
5.0000 mg | ORAL_TABLET | Freq: Once | ORAL | Status: AC
  Administered 2020-05-22: 5 mg via ORAL
  Filled 2020-05-22: qty 1

## 2020-05-22 MED ORDER — LORAZEPAM 1 MG PO TABS: 1.0000 mg | ORAL_TABLET | Freq: Once | ORAL | Status: DC

## 2020-05-22 NOTE — ED Notes (Signed)
Pt asked for her vitals to be rechecked again b/c she felt her BP at 0625 was not correct

## 2020-05-22 NOTE — Progress Notes (Signed)
Declined PORT access at this time. Fran Lowes, RN VAST

## 2020-05-22 NOTE — ED Notes (Signed)
Pt states she has felt some relief from taking the Nitro.

## 2020-05-22 NOTE — Discharge Instructions (Addendum)
You have been evaluated at [place] emergency department by myself Dr. Renold Genta as well as attending physician Dr. Isla Pence regarding your elevated blood pressures and chest pain. Your blood pressure although very elevated initially has come down after taking your morning medicines, would likely benefit from an additional blood pressure medicine at home for which I am prescribing Norvasc that she will take once nightly. Your blood work looking for damage to the heart or blood clots is reassuringly normal. Your chest pain was discussed with the cardiologist on-call Dr. Alcario Drought who suggested the medication that I am prescribing to you as well as close follow-up with your cardiologist Dr. Burt Knack. Please take your blood pressure and record it once daily until follow-up with Dr. Burt Knack.

## 2020-05-22 NOTE — Telephone Encounter (Signed)
Sheri Hernandez and her husband are calling stating she is currently in the hospital and has been since last night. They are wanting to speak with Dr. Burt Knack due to not yet being seen by a Doctor and going to the hospital because her systolic is ranging in the 200-220. They state they don't remember the diastolic at this time, but are worried she is going to stroke out. Please advise.

## 2020-05-22 NOTE — Telephone Encounter (Signed)
Spoke with pt again who is calling back to give [place] an update on her status.  She reports her husband brought her medications from home and she took them.  On re-check of VS BP 136/85 HR 72 appr 1 hour after her medications.  She is now being some better though tired because she has not slept in almost "20 hours" now.  Pt was very appreciative of the call back and having the ability to be heard ("thanks for much for listening to me")  Reassurance given that she is in the right place for the most appropriate care right now.  As we were talking she stated "they are taking me to my room now" and disconnected the call.

## 2020-05-22 NOTE — Telephone Encounter (Signed)
Patient calling back. She is very frustrated and anxious. She would like to speak to the nurse she spoke to previously to give her an update.

## 2020-05-22 NOTE — ED Triage Notes (Signed)
Pt extremely anxious in triage, pulled her back to collect a repeat EKG d/t pt hyperventilating in waiting room.  Able to help pt relax with deep breathing exercises. Unable to obtain labs from pt d/t poor venous access. Pt agreed to allow me to try one time, pt tense and pulling away every time I tried to stick her with a butterfly needle.  Pt has a porta cath, will collect labs when we she gets into a room.

## 2020-05-22 NOTE — ED Notes (Signed)
Pt stated her chest pain is getting worse and stated she took one of her own Nitro's

## 2020-05-22 NOTE — Telephone Encounter (Signed)
Spoke with patient who is very distressed because she has been in the ED at [facility] since last night.  She reports her BP at home last night was very elevated @ 220/115.  Her husband called 76 and she was transported to the ED.  She reports since she has been there she has no received any care.  They have taken her VS, done EKG and CXR but have not been able to obtain a vein, so no blood work as of yet. (has a port-a-cath)  She also reports she is not getting her medications. She has asked about her medications and was told she will not receive any of her medications until she is admitted.  She reports having have 2 episodes of angina during the night and has taken her own SL Ntg with relief.   Again she is very anxious and concerned about the delay in care.  Advised pt that while I understand her frustration d/t the wait she is experiencing, she is in the best place to obtain the care she needs.  Reassurance given as last VS documented at 8:29 am demonstrated BP 176/91, HR 91 02 sat 99% on RA.  Advised to try to remain calm to assist in control of her BP.  Pt thanked me for the call and information.

## 2020-05-22 NOTE — ED Provider Notes (Signed)
[facility] EMERGENCY DEPARTMENT Provider Note   CSN: [id number] Arrival date & time: 05/21/20  2126     History Chief Complaint  Patient presents with  . Chest Pain    Sheri Hernandez is a 64 y.o. female with past medical history of hypertension, hypothyroidism, hyperglycemia, anxiety, CAD status post CABG in 2016, metastatic renal cancer status post left nephrectomy on chemotherapy presents to ED for evaluation of high blood pressure as well as chest pain.  The patient states that 2 days prior to presentation she took her blood pressure at home in the evening and was elevated at 180/100.  She states that she took it multiple times over the remainder that evening and the next day rating as high as 332 systolic.  With this was associated left arm pain, bladder shoulder and back pain sensation of tightness and aching and anxiety.  She presented for this reason yesterday evening.  Unfortunately refused blood draws in the lobby and with prolonged wait.  A couple hours prior to my evaluation patient had an episode of what she reports to be her "angina" she describes this as a substernal tightness with associated nausea responding to her nitroglycerin.  The history is provided by the patient.  Chest Pain Pain location:  Substernal area Pain quality: tightness   Pain radiates to:  Does not radiate Pain severity:  Moderate Onset quality:  Gradual Duration:  30 minutes Timing:  Constant Progression:  Resolved Context: at rest   Associated symptoms: nausea and shortness of breath   Associated symptoms: no abdominal pain, no cough, no dizziness, no fever, no headache, no palpitations and no vomiting     HPI: A 64 year old patient with a history of hypertension, hypercholesterolemia and obesity presents for evaluation of chest pain. Initial onset of pain was approximately 1-3 hours ago. The patient's chest pain is described as heaviness/pressure/tightness, is worse with  exertion and is relieved by nitroglycerin. The patient's chest pain is middle- or left-sided, is not well-localized, is not sharp and does not radiate to the arms/jaw/neck. The patient does not complain of nausea and denies diaphoresis. The patient has a family history of coronary artery disease in a first-degree relative with onset less than age 59. The patient has no history of stroke, has no history of peripheral artery disease, has not smoked in the past 90 days and denies any history of treated diabetes.   Past Medical History:  Diagnosis Date  . Acute blood loss anemia 01/12/2015  . Adenocarcinoma, renal cell ([place]) 11/12/2012   Overview:   09/25/12 - CXR - lung masses  CT CAP - rt hilar mass, mult pulm nodules, LLL mass 3 x 3.1 cm, RLL mass 2.2 x 2.0 cm, left kidney mass 12.4 x 11.1 x 10.3 cm  10/05/12 PET - left lung mass 30 mm SUV 16.7, rt lung nodule 21 mm, SUV 10.4, left kidney mass 7 cm SUV 18, mass compressing RLL bronchus  10/11/12 lung biopsy - atypical cells  10/23/12 left kidney biopsy - high gr clear cell RCC   . Anxiety   . Arthritis    "maybe in my right hand" (05/26/2017)  . CAD (coronary artery disease)    a. 12/2014 Cath: LM nl, LAD 20p, 53m, D2 20ost, LCX 95p, 28m, RCA dominant, 95p, 33m/d, RPDA 95, RPL2 50, attempted RCA PCI w/ acute RCA occlusion-->CABG x 2 VG->RPDA->PLVB.  Marland Kitchen Chronic diastolic CHF (congestive heart failure) ([place])   . Drug-induced hypothyroidism 03/13/2014  . GERD (gastroesophageal  reflux disease) 03/18/2015  . Hyperglycemia 01/12/2015  . Hypertension   . Hypoalbuminemia   . Hypothyroidism   . Lower extremity edema   . Lung mass    LUNG MASSES--COUGH-BIOPSY NON-DIAGNOSTIC.  HX OF MEDIASTINAL MASS AND LUNG MASSES AGE 21 -NEGATIVE BIOSPY-BUT GIVEN DIAGNOSIS OF SARCOIDOSIS.  WORK UP CONTINUING ON DIAGNOSIS FOR PT'S LUNG MASSES--SHE HAS BIOPSY PROVEN KIDNEY CANCER  . Mass of left breast 08/01/2011  . Myocardial infarction ([place]) 12/2014 X 2  . 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  . Papilloma of breast 2012   left. lumpectomy  . Pulmonary nodules   . Renal cell cancer [facility])    metastatic renal cell carcinoma (biopsy of left renal mass 10/2012) On nivolumab at [facility]  . Renal mass 10/01/2012   10/23/12 Bx Pos high grad clear cell renal cell ca> referred to oncology 10/26/2012    . S/p nephrectomy    left, due to metastatic RCC, 3/14    Patient Active Problem List   Diagnosis Date Noted  . Chronic diastolic heart failure ([place]) 06/16/2017  . Hyperlipidemia 12/14/2016  . CAD (coronary artery disease) 10/12/2016  . Anxiety 10/10/2016  . Near syncope   . Chest pain 10/08/2016  . Essential hypertension 06/01/2016  . GERD (gastroesophageal reflux disease) 03/18/2015  . Acute blood loss anemia 01/12/2015  . Hyperglycemia 01/12/2015  . History of non-ST elevation myocardial infarction (NSTEMI)   . S/P CABG x 2 01/03/2015  . Drug-induced hypothyroidism 03/13/2014  . S/p nephrectomy   . Adenocarcinoma, renal cell ([place]) 11/12/2012  . Lung mass 10/01/2012  . Renal mass 10/01/2012  . Mass of left breast 08/01/2011    Past Surgical History:  Procedure Laterality Date  . BREAST BIOPSY  08/18/2011   Procedure: BREAST BIOPSY WITH NEEDLE LOCALIZATION;  Surgeon: Merrie Roof, MD;  Location: [place];  Service: General;  Laterality: Left;  left breast biopsy with needle localization  . BRONCHIAL BRUSH BIOPSY  2014  . CARDIAC CATHETERIZATION    . CORONARY ARTERY BYPASS GRAFT N/A 01/02/2015   Procedure: CORONARY ARTERY BYPASS GRAFTING (CABG)TIMES TWO USING RIGHT GREATER SAPHENOUS LEG VEIN HARVESTED ENDOSCOPICALLY;  Surgeon: [name], MD;  Location: [place];  Service: Open Heart Surgery;  Laterality: N/A;  . LAPAROSCOPIC NEPHRECTOMY Left 11/22/2012    Procedure: LAPAROSCOPIC NEPHRECTOMY;  Surgeon: Dutch Gray, MD;  Location: [facility];  Service: Urology;  Laterality: Left;  . LEFT HEART CATH AND CORS/GRAFTS ANGIOGRAPHY N/A 10/11/2016   Procedure: Left Heart Cath and Cors/Grafts Angiography;  Surgeon: Sherren Mocha, MD;  Location: [place] CV LAB;  Service: Cardiovascular;  Laterality: N/A;  . LEFT HEART CATHETERIZATION WITH CORONARY ANGIOGRAM N/A 01/02/2015   Procedure: LEFT HEART CATHETERIZATION WITH CORONARY ANGIOGRAM;  Surgeon: Leonie Man, MD;  Location: [facility] CATH LAB;  Service: Cardiovascular;  Laterality: N/A;  . NECK LESION BIOPSY Left 08/2016  . PORTA CATH INSERTION Right ~ 2017  . TONSILLECTOMY  1967  . VIDEO BRONCHOSCOPY  10/11/2012   Procedure: VIDEO BRONCHOSCOPY WITH FLUORO;  Surgeon: Kathee Delton, MD;  Location: [facility];  Service: Cardiopulmonary;  Laterality: Bilateral;  . WIDS    . WISDOM TOOTH EXTRACTION     "all 4"     OB History  Gravida  0   Para  0   Term  0   Preterm  0   AB  0   Living  0     SAB  0   TAB  0   Ectopic  0   Multiple  0   Live Births              Family History  Problem Relation Age of Onset  . Heart disease Mother   . Heart disease Father   . Heart attack Father   . Cancer Maternal Grandfather   . Heart disease Brother   . Stroke Brother   . Aortic dissection Brother   . Colon cancer Maternal Aunt   . Skin cancer Maternal Uncle   . Stomach cancer Maternal Uncle   . Heart disease Maternal Grandmother   . Breast cancer Maternal Aunt     Social History   Tobacco Use  . Smoking status: Former Smoker    Packs/day: 1.50    Years: 40.00    Pack years: 60.00    Types: Cigarettes    Quit date: 09/24/2012    Years since quitting: 7.6  . Smokeless tobacco: Never Used  Vaping Use  . Vaping Use: Never used  Substance Use Topics  . Alcohol use: Yes    Comment: 05/26/2017 "couple drinks/year"  . Drug use: No    Home Medications Prior to Admission medications    Medication Sig Start Date End Date Taking? Authorizing Provider  acetaminophen (TYLENOL) 500 MG tablet Take 500-1,000 mg by mouth every 6 (six) hours as needed for mild pain or headache.   Yes [provider]  aspirin 81 MG EC tablet Take 1 tablet (81 mg total) by mouth daily. 02/09/16  Yes Sherren Mocha, MD  atorvastatin (LIPITOR) 40 MG tablet TAKE 1 TABLET (40 MG TOTAL) BY MOUTH DAILY. 05/08/20  Yes Sherren Mocha, MD  Cholecalciferol (VITAMIN D-3) 25 MCG (1000 UT) CAPS Take 1,000 Units by mouth daily with breakfast.   Yes [provider]  clopidogrel (PLAVIX) 75 MG tablet TAKE 1 TABLET BY MOUTH EVERY DAY Patient taking differently: Take 75 mg by mouth daily.  08/27/19  Yes Sherren Mocha, MD  furosemide (LASIX) 40 MG tablet Take 1 tablet (40 mg total) by mouth daily. 09/08/17  Yes Sherren Mocha, MD  HYDROcodone-acetaminophen (NORCO/VICODIN) 5-325 MG tablet Take 1 tablet by mouth every 6 (six) hours as needed (for pain).  03/31/20  Yes [provider]  isosorbide mononitrate (IMDUR) 120 MG 24 hr tablet Take 1 tablet (120 mg total) by mouth daily. 12/19/19 12/13/20 Yes Sherren Mocha, MD  levothyroxine (SYNTHROID) 150 MCG tablet TAKE 1 TABLET (137 MCG TOTAL) BY MOUTH DAILY BEFORE BREAKFAST. Patient taking differently: Take 150 mcg by mouth daily before breakfast.  01/24/20  Yes Pickard, Cammie Mcgee, MD  lidocaine-prilocaine (EMLA) cream Apply 1 application topically See admin instructions. APPLY CREAM TO PORTACATH SITE APPROXIMATELY 1 HOUR BEFORE ACCESS AND COVER 01/09/19  Yes [provider]  Menthol (RICOLA) LOZG Use as directed 1 lozenge in the mouth or throat as needed (for a sore throat).   Yes [provider]  metoprolol tartrate (LOPRESSOR) 100 MG tablet TAKE 1 TABLET BY MOUTH 2 TIMES DAILY. MAKE YEARLY APPT WITH DR. Burt Knack FOR JUNE FOR FUTURE REFILLS. Patient taking differently: Take 100 mg by mouth 2 (two) times daily.  03/31/20  Yes Sherren Mocha, MD   nitroGLYCERIN (NITROSTAT) 0.4 MG SL tablet PLACE 1 TABLET UNDER THE TONGUE  EVERY 5 MINUTES AS NEEDED FOR CHEST PAIN Patient taking differently: Place 0.4 mg under the tongue every 5 (five) minutes as needed for chest pain.  04/16/20  Yes Sherren Mocha, MD  Nivolumab (OPDIVO IV) Inject into the vein every 30 (thirty) days. Chemo therapy   Yes [provider]  ondansetron (ZOFRAN ODT) 4 MG disintegrating tablet Take 1 tablet (4 mg total) by mouth every 8 (eight) hours as needed for nausea or vomiting. Patient taking differently: Take 4 mg by mouth every 8 (eight) hours as needed for nausea or vomiting (DISSOLVE ORALLY).  05/14/19  Yes [place], Modena Nunnery, MD  Acetaminophen (TYLENOL PO) Take 2 tablets by mouth every 6 (six) hours as needed (headache). Patient not taking: Reported on 05/22/2020    [provider]  amLODipine (NORVASC) 5 MG tablet Take 1 tablet (5 mg total) by mouth at bedtime. 05/22/20   Renold Genta, MD  benzonatate (TESSALON) 100 MG capsule Take 1 capsule (100 mg total) by mouth 3 (three) times daily as needed for cough. Patient not taking: Reported on 05/22/2020 05/14/19   Alycia Rossetti, MD  Cholecalciferol (VITAMIN D PO) Take 1 tablet by mouth daily. Patient not taking: Reported on 05/22/2020    [provider]  oxycodone (OXY-IR) 5 MG capsule Take 5 mg by mouth daily as needed (severe pain). pain Patient not taking: Reported on 05/22/2020 08/30/16   [provider]    Allergies    Silver and Tape  Review of Systems   Review of Systems  Constitutional: Negative for chills and fever.  HENT: Negative for facial swelling and voice change.   Eyes: Negative for redness and visual disturbance.  Respiratory: Positive for shortness of breath. Negative for cough.   Cardiovascular: Positive for chest pain. Negative for palpitations.  Gastrointestinal: Positive for nausea. Negative for abdominal pain and vomiting.  Genitourinary: Negative for  difficulty urinating and dysuria.  Musculoskeletal: Negative for gait problem and joint swelling.  Skin: Negative for rash and wound.  Neurological: Negative for dizziness and headaches.  Psychiatric/Behavioral: Negative for confusion and suicidal ideas. The patient is nervous/anxious.     Physical Exam Updated Vital Signs BP (!) 171/96   Pulse 93   Temp 98.6 F (37 C) (Oral)   Resp (!) 22   SpO2 92%   Physical Exam Constitutional:      General: She is not in acute distress. HENT:     Head: Normocephalic and atraumatic.     Mouth/Throat:     Mouth: Mucous membranes are moist.     Pharynx: Oropharynx is clear.  Eyes:     General: No scleral icterus.    Pupils: Pupils are equal, round, and reactive to light.  Cardiovascular:     Rate and Rhythm: Normal rate and regular rhythm.     Pulses: Normal pulses.  Pulmonary:     Effort: Pulmonary effort is normal. No respiratory distress.  Abdominal:     General: There is no distension.     Tenderness: There is no abdominal tenderness.  Musculoskeletal:        General: No tenderness or deformity.     Cervical back: Normal range of motion and neck supple.     Right lower leg: Edema present.     Left lower leg: Edema present.     Comments: Right lower extremity with slightly increased edema compared of the left, bilaterally tender to touch for patient  Neurological:     General: No focal deficit present.  Mental Status: She is alert and oriented to person, place, and time.  Psychiatric:        Mood and Affect: Mood normal.        Behavior: Behavior normal.     ED Results / Procedures / Treatments   Labs (all labs ordered are listed, but only abnormal results are displayed) Labs Reviewed  COMPREHENSIVE METABOLIC PANEL - Abnormal; Notable for the following components:      Result Value   Glucose, Bld 157 (*)    All other components within normal limits  CBC WITH DIFFERENTIAL/PLATELET - Abnormal; Notable for the following  components:   WBC 11.6 (*)    RBC 5.20 (*)    Hemoglobin 15.1 (*)    HCT 46.4 (*)    RDW 15.8 (*)    Monocytes Absolute 1.3 (*)    All other components within normal limits  I-STAT CHEM 8, ED - Abnormal; Notable for the following components:   Glucose, Bld 155 (*)    Hemoglobin 16.0 (*)    HCT 47.0 (*)    All other components within normal limits  MAGNESIUM  TSH  D-DIMER, QUANTITATIVE (NOT AT [facility])  CBC  TROPONIN I (HIGH SENSITIVITY)  TROPONIN I (HIGH SENSITIVITY)  TROPONIN I (HIGH SENSITIVITY)  TROPONIN I (HIGH SENSITIVITY)  TROPONIN I (HIGH SENSITIVITY)    EKG EKG Interpretation  Date/Time:  Friday May 22 2020 09:28:03 EDT Ventricular Rate:  96 PR Interval:  188 QRS Duration: 90 QT Interval:  384 QTC Calculation: 485 R Axis:   10 Text Interpretation: Normal sinus rhythm Possible Left atrial enlargement Left ventricular hypertrophy ( R in aVL , Cornell product ) Nonspecific ST and T wave abnormality Abnormal ECG No significant change since last tracing Confirmed by Isla Pence [phone]) on 05/22/2020 3:19:37 PM   Radiology DG Chest 2 View  Result Date: 05/21/2020 CLINICAL DATA:  Chest pain EXAM: CHEST - 2 VIEW COMPARISON:  March 07, 2018 FINDINGS: The heart size and mediastinal contours are mildly enlarged. Aortic knob calcifications are seen. Overlying median sternotomy wires are present. A right-sided MediPort catheter seen with the tip at the superior cavoatrial junction. No airspace consolidation or pleural effusion. No acute osseous abnormality. IMPRESSION: No active cardiopulmonary disease. Electronically Signed   By: Prudencio Pair M.D.   On: 05/21/2020 22:16    Procedures Procedures (including critical care time)  Medications Ordered in ED Medications  LORazepam (ATIVAN) tablet 1 mg (has no administration in time range)  amLODipine (NORVASC) tablet 5 mg (5 mg Oral Given 05/22/20 2116)    ED Course  I have reviewed the triage vital signs and the nursing  notes.  Pertinent labs & imaging results that were available during my care of the patient were reviewed by me and considered in my medical decision making (see chart for details).    MDM Rules/Calculators/A&P HEAR Score: 5                        EKG findings by my read: Compared to prior: 04/27/2020.  Rate: 90 rhythm: sinus Axis: appropriate  PR: 178 QRS: 94 QTc: 474. No evidence of ischemia or arrhythmia, nor any other pathologic findings concerning considering patient presentation. Findings discussed with attending who agrees.  Differential diagnosis considered: ACS, PE, aortic dissection, pneumonia, pneumothorax, anxiety, emergent complications of ulcer, esophageal spasm  Patient presents originally for high blood pressure with symptoms concerning for possible hypertensive emergency versus ACS.  It is notable however the  patient had repeatedly checked her blood pressure at home multiple times and become increasingly anxious as blood pressures increased which could be falsely elevated in the setting of her anxiety as could be her symptoms.  On presentation hypertension notable as is anxiety on review of triage notes.  Her episode later though of anginal-like symptoms at rest is also quite concerning prickly considering her history of known CAD and bypass surgery.  Access severely delayed given patient's refusal for IVs, IV team called to access the patient's port.  We will do a broad chest pain work-up, will obtain D-dimer and additional two serial troponins, chemistries.  Blood pressure improving following a.m. antihypertensives the patient took in the lobby.  Labs obtained be ultrasound-guided IV after patient refused Port-A-Cath access without lidocaine.  CBC and chemistries reviewed and unremarkable, D-dimer negative making PE in the setting of no mediastinal widening and prolonged duration of symptoms with some improvement, find dissection very unlikely as well.  Troponin within  normal limits x1.  Case discussed with cardiology on call who feels that despite the patient's risk, if troponins negative x2, very low likelihood of ACS and patient can be safely followed up in outpatient setting as she is closely followed by Dr. Burt Knack.  He suggests starting Norvasc here.  Repeat troponin negative, patient counseled of plan to initiate Norvasc, counseled to record blood pressure once daily to obtain a log.  Counseled for strict return precautions including chest pain at rest, shortness of breath, worsening leg swelling she expressed understanding and agreement.  First dose Norvasc 10 prior to discharge at this time I feel patient stable for discharge home to follow-up with Dr. Burt Knack outpatient.  Labs reviewed and interpreted by myself with significant findings above. Imaging reviewed by myself and interpreted by radiologist.  Case and plan above discussed with my attending Dr. Gilford Raid   Final Clinical Impression(s) / ED Diagnoses Final diagnoses:  Hypertension, unspecified type  Precordial chest pain    Rx / DC Orders ED Discharge Orders         Ordered    amLODipine (NORVASC) 5 MG tablet  Nightly        05/22/20 2112         Labs, studies and imaging reviewed by myself and considered in medical decision making if ordered. Imaging interpreted by radiology. Pt was discussed with my attending, Dr. Gilford Raid  Electronically signed by:  Roderic Palau [place]9/17/202111:29 PM       Renold Genta, MD 05/22/20 2329    Isla Pence, MD 05/25/20 1800

## 2020-05-24 ENCOUNTER — Other Ambulatory Visit: Payer: Self-pay | Admitting: Family Medicine

## 2020-05-25 ENCOUNTER — Telehealth: Payer: Self-pay | Admitting: Cardiovascular Disease

## 2020-05-25 NOTE — Telephone Encounter (Signed)
Pt c/o BP issue: STAT if pt c/o blurred vision, one-sided weakness or slurred speech  1. What are your last 5 BP readings? 176/100, 154/107, this morning 177/108 last night  2. Are you having any other symptoms (ex. Dizziness, headache, blurred vision, passed out)? Numbness and tinging in her  face, tingling in her finger tips  3. What is your BP issue? High blood pressure- pt wanted to be seen- made an appt with Vin for tomorrow- please call to evaluate

## 2020-05-25 NOTE — Telephone Encounter (Signed)
Left message to call office

## 2020-05-26 ENCOUNTER — Encounter: Payer: Self-pay | Admitting: Physician Assistant

## 2020-05-26 ENCOUNTER — Telehealth: Payer: Self-pay | Admitting: Physician Assistant

## 2020-05-26 ENCOUNTER — Other Ambulatory Visit: Payer: Self-pay

## 2020-05-26 ENCOUNTER — Ambulatory Visit (INDEPENDENT_AMBULATORY_CARE_PROVIDER_SITE_OTHER): Payer: Medicare Other | Admitting: Physician Assistant

## 2020-05-26 VITALS — BP 144/90 | HR 84 | Ht 68.0 in | Wt 219.0 lb

## 2020-05-26 DIAGNOSIS — I25708 Atherosclerosis of coronary artery bypass graft(s), unspecified, with other forms of angina pectoris: Secondary | ICD-10-CM | POA: Diagnosis not present

## 2020-05-26 DIAGNOSIS — R011 Cardiac murmur, unspecified: Secondary | ICD-10-CM | POA: Diagnosis not present

## 2020-05-26 DIAGNOSIS — R0602 Shortness of breath: Secondary | ICD-10-CM | POA: Diagnosis not present

## 2020-05-26 DIAGNOSIS — I25119 Atherosclerotic heart disease of native coronary artery with unspecified angina pectoris: Secondary | ICD-10-CM | POA: Diagnosis not present

## 2020-05-26 MED ORDER — AMLODIPINE BESYLATE 5 MG PO TABS: ORAL_TABLET | ORAL | 3 refills | Status: DC

## 2020-05-26 NOTE — Telephone Encounter (Signed)
Patient called stating that there needs to be a correction made on her chart. On the AVS summary states  INCREASE the Amlodipine to 5 mg taking 1 1/2 tablet daily, she states it should be 7.5mg  as is was just increased to 1 1/2 tablets.

## 2020-05-26 NOTE — Telephone Encounter (Signed)
Returned call to pt to help her understand the reasoning behind writing increase Amlodipine to 5 mg taking 1 1/2 tablet, because it doesn't come in a 7.5 mg tablet. Pt thanked me for the call.

## 2020-05-26 NOTE — Progress Notes (Signed)
Cardiology Office Note    Date:  05/26/2020   ID:  CHAREESE SERGENT, DOB 1955-09-28, MRN [id number]  PCP:  Susy Frizzle, MD  Cardiologist:  Dr. Burt Knack  Chief Complaint: Elevated Blood pressure   History of Present Illness:   JAYDY FITZHENRY is a 64 y.o. female  CAD with chronic angina, HTN, HLD and metastatic renal cell carcinoma s/p L nephrectomy, breast cancer s/p L left lumpectomy seen for elevated blood pressure   She presented with non-STEMI in 2016 and underwent complex PCI of severe stenosis in the right coronary artery complicated by extensive dissection and acute inferior STEMI. She required emergency CABG with a saphenous vein graft to the right PDA and PLA. She had recurrent angina with cardiac catheterization demonstrating occlusion of her bypass graft. Her RCA remained patent with residual tight stenosis of the RCA with medical therapy recommended.  Last seen by Dr. Burt Knack 04/27/2020. Reported chest discomfort after eating.   Patient was seen in emergency room last week for systolic blood pressure of 220.  Placed on amlodipine 5 mg daily.  Blood pressure now ranging 170-180/100s.  Patient is very anxious at her baseline.  Denies chest pain, shortness of breath, lower extremity edema or melena.  Did not brought her blood pressure cuff.   Past Medical History:  Diagnosis Date   Acute blood loss anemia 01/12/2015   Adenocarcinoma, renal cell ([place]) 11/12/2012   Overview:   09/25/12 - CXR - lung masses  CT CAP - rt hilar mass, mult pulm nodules, LLL mass 3 x 3.1 cm, RLL mass 2.2 x 2.0 cm, left kidney mass 12.4 x 11.1 x 10.3 cm  10/05/12 PET - left lung mass 30 mm SUV 16.7, rt lung nodule 21 mm, SUV 10.4, left kidney mass 7 cm SUV 18, mass compressing RLL bronchus  10/11/12 lung biopsy - atypical cells  10/23/12 left kidney biopsy - high gr clear cell RCC    Anxiety    Arthritis    "maybe in my right hand" (05/26/2017)   CAD (coronary artery disease)    a. 12/2014  Cath: LM nl, LAD 20p, 62m, D2 20ost, LCX 95p, 24m, RCA dominant, 95p, 59m/d, RPDA 95, RPL2 50, attempted RCA PCI w/ acute RCA occlusion-->CABG x 2 VG->RPDA->PLVB.   Chronic diastolic CHF (congestive heart failure) (HCC)    Drug-induced hypothyroidism 03/13/2014   GERD (gastroesophageal reflux disease) 03/18/2015   Hyperglycemia 01/12/2015   Hypertension    Hypoalbuminemia    Hypothyroidism    Lower extremity edema    Lung mass    LUNG MASSES--COUGH-BIOPSY NON-DIAGNOSTIC.  HX OF MEDIASTINAL MASS AND LUNG MASSES AGE 59 -NEGATIVE BIOSPY-BUT GIVEN DIAGNOSIS OF SARCOIDOSIS.  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   Papilloma of breast 2012   left. lumpectomy   Pulmonary nodules    Renal cell cancer (HCC)    metastatic renal cell carcinoma (biopsy of left renal mass 10/2012) On nivolumab at [facility]   Renal mass 10/01/2012   10/23/12 Bx Pos high grad clear cell renal cell ca> referred to oncology 10/26/2012  S/p nephrectomy    left, due to metastatic RCC, 3/14    Past Surgical History:  Procedure Laterality Date   BREAST BIOPSY  08/18/2011   Procedure: BREAST BIOPSY WITH NEEDLE LOCALIZATION;  Surgeon: Merrie Roof, MD;  Location: [facility];  Service: General;  Laterality: Left;  left breast biopsy with needle localization   BRONCHIAL BRUSH BIOPSY  2014   CARDIAC CATHETERIZATION     CORONARY ARTERY BYPASS GRAFT N/A 01/02/2015   Procedure: CORONARY ARTERY BYPASS GRAFTING (CABG)TIMES TWO USING RIGHT GREATER SAPHENOUS LEG VEIN HARVESTED ENDOSCOPICALLY;  Surgeon: [name], MD;  Location: [facility];  Service: Open  Heart Surgery;  Laterality: N/A;   LAPAROSCOPIC NEPHRECTOMY Left 11/22/2012   Procedure: LAPAROSCOPIC NEPHRECTOMY;  Surgeon: Dutch Gray, MD;  Location: [facility];  Service: Urology;  Laterality: Left;   LEFT HEART CATH AND CORS/GRAFTS ANGIOGRAPHY N/A 10/11/2016   Procedure: Left Heart Cath and Cors/Grafts Angiography;  Surgeon: Sherren Mocha, MD;  Location: [facility];  Service: Cardiovascular;  Laterality: N/A;   LEFT HEART CATHETERIZATION WITH CORONARY ANGIOGRAM N/A 01/02/2015   Procedure: LEFT HEART CATHETERIZATION WITH CORONARY ANGIOGRAM;  Surgeon: Leonie Man, MD;  Location[facility] CATH LAB;  Service: Cardiovascular;  Laterality: N/A;   NECK LESION BIOPSY Left 08/2016   PORTA CATH INSERTION Right ~ 2017   [place] BRONCHOSCOPY  10/11/2012   Procedure: VIDEO BRONCHOSCOPY WITH FLUORO;  Surgeon: Kathee Delton, MD;  Location: [facility];  Service: Cardiopulmonary;  Laterality: Bilateral;   WIDS     WISDOM TOOTH EXTRACTION     "all 4"    Current Medications: Prior to Admission medications   Medication Sig Start Date End Date Taking? Authorizing Provider  Acetaminophen (TYLENOL PO) Take 2 tablets by mouth every 6 (six) hours as needed (headache). Patient not taking: Reported on 05/22/2020    [provider]  acetaminophen (TYLENOL) 500 MG tablet Take 500-1,000 mg by mouth every 6 (six) hours as needed for mild pain or headache.    [provider]  amLODipine (NORVASC) 5 MG tablet Take 1 tablet (5 mg total) by mouth at bedtime. 05/22/20   Renold Genta, MD  aspirin 81 MG EC tablet Take 1 tablet (81 mg total) by mouth daily. 02/09/16   Sherren Mocha, MD  atorvastatin (LIPITOR) 40 MG tablet TAKE 1 TABLET (40 MG TOTAL) BY MOUTH DAILY. 05/08/20   Sherren Mocha, MD  benzonatate (TESSALON) 100 MG capsule Take 1 capsule (100 mg total) by mouth 3 (three) times daily as needed for cough. Patient not taking: Reported on 05/22/2020 05/14/19   Alycia Rossetti, MD  Cholecalciferol (VITAMIN D PO) Take 1 tablet by mouth daily. Patient not taking: Reported on 05/22/2020    [provider]  Cholecalciferol (VITAMIN D-3) 25 MCG (1000 UT) CAPS Take 1,000 Units by mouth daily with breakfast.    [provider]  clopidogrel (PLAVIX) 75 MG tablet TAKE 1 TABLET BY MOUTH EVERY DAY Patient taking differently: Take 75 mg by mouth daily.  08/27/19   Sherren Mocha, MD  furosemide (LASIX) 40 MG tablet Take 1 tablet (40 mg total) by mouth daily. 09/08/17   Sherren Mocha, MD  HYDROcodone-acetaminophen (NORCO/VICODIN) 5-325 MG tablet Take 1 tablet by mouth every 6 (six) hours as needed (for pain).  03/31/20   [provider]  isosorbide mononitrate (IMDUR) 120 MG 24 hr tablet Take 1 tablet (120 mg total) by mouth daily. 12/19/19 12/13/20  Sherren Mocha,  MD  levothyroxine (SYNTHROID) 150 MCG tablet Take 1 tablet (150 mcg total) by mouth daily before breakfast. 05/25/20   Susy Frizzle, MD  lidocaine-prilocaine (EMLA) cream Apply 1 application topically See admin instructions. APPLY CREAM TO PORTACATH SITE APPROXIMATELY 1 HOUR BEFORE ACCESS AND COVER 01/09/19   [provider]  Menthol (RICOLA) LOZG Use as directed 1 lozenge in the mouth or throat as needed (for a sore throat).    [provider]  metoprolol tartrate (LOPRESSOR) 100 MG tablet TAKE 1 TABLET BY MOUTH 2 TIMES DAILY. MAKE YEARLY APPT WITH DR. Burt Knack FOR JUNE FOR FUTURE REFILLS. Patient taking differently: Take 100 mg by mouth 2 (two) times daily.  03/31/20   Sherren Mocha, MD  nitroGLYCERIN (NITROSTAT) 0.4 MG SL tablet PLACE 1 TABLET UNDER THE TONGUE EVERY 5 MINUTES AS NEEDED FOR CHEST PAIN Patient taking differently: Place 0.4 mg under the tongue every 5 (five) minutes as needed for chest pain.  04/16/20   Sherren Mocha, MD  Nivolumab (OPDIVO IV) Inject into the vein every 30 (thirty) days. Chemo therapy    [provider]  ondansetron (ZOFRAN  ODT) 4 MG disintegrating tablet Take 1 tablet (4 mg total) by mouth every 8 (eight) hours as needed for nausea or vomiting. Patient taking differently: Take 4 mg by mouth every 8 (eight) hours as needed for nausea or vomiting (DISSOLVE ORALLY).  05/14/19   Beaulieu, Modena Nunnery, MD  oxycodone (OXY-IR) 5 MG capsule Take 5 mg by mouth daily as needed (severe pain). 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   Tobacco Use   Smoking status: Former Smoker    Packs/day: 1.50    Years: 40.00    Pack years: 60.00    Types: Cigarettes    Quit date: 09/24/2012    Years since quitting: 7.6   Smokeless tobacco: Never Used  Vaping Use   Vaping Use: Never used  Substance and Sexual Activity   Alcohol use: Yes    Comment: 05/26/2017 "couple drinks/year"   Drug use: No   Sexual activity: Yes  Other Topics Concern   Not on file  Social History Narrative   Not on file   Social Determinants of Health   Financial Resource Strain:    Difficulty of Paying Living Expenses: Not on file  Food Insecurity:    Worried About Charity fundraiser in the Last Year: Not on file   [employer] of Food in the Last Year: Not on file  Transportation Needs:    Lack of Transportation (Medical): Not on file   Lack of Transportation (Non-Medical): Not on file  Physical Activity:    Days of Exercise per Week: Not on file   Minutes of Exercise per Session: Not on file  Stress:    Feeling of Stress : Not on file  Social Connections:    Frequency of Communication with Friends and Family: Not on file   Frequency of Social Gatherings with Friends and  Family: Not on file   Attends Religious Services: Not on file   Active Member  of Clubs or Organizations: Not on file   Attends Archivist Meetings: Not on file   Marital Status: Not on file     Family History:  The patient's family history includes Aortic dissection in her brother; Breast cancer in her maternal aunt; Cancer in her maternal grandfather; Colon cancer in her maternal aunt; Heart attack in her father; Heart disease in her brother, father, maternal grandmother, and mother; Skin cancer in her maternal uncle; Stomach cancer in her maternal uncle; Stroke in her brother.   ROS:   Please see the history of present illness.    ROS All other systems reviewed and are negative.   PHYSICAL EXAM:   VS:  BP (!) 144/90    Pulse 84    Ht 5\' 8"  (1.727 m)    Wt 219 lb (99.3 kg)    SpO2 94%    BMI 33.30 kg/m    GEN: Well nourished, well developed, in no acute distress  HEENT: normal  Neck: no JVD, carotid bruits, or masses Cardiac: RRR; 3/6 systolic murmurs, rubs, or gallops,no edema  Respiratory:  clear to auscultation bilaterally, normal work of breathing GI: soft, nontender, nondistended, + BS MS: no deformity or atrophy  Skin: warm and dry, no rash Neuro:  Alert and Oriented x 3, Strength and sensation are intact Psych: euthymic mood, full affect  Wt Readings from Last 3 Encounters:  05/26/20 219 lb (99.3 kg)  04/27/20 221 lb 6.4 oz (100.4 kg)  12/19/19 218 lb 12.8 oz (99.2 kg)      Studies/Labs Reviewed:   EKG:  EKG is not ordered today.    Recent Labs: 05/22/2020: ALT 23; BUN 17; Creatinine, Ser 0.70; Hemoglobin 16.0; Magnesium 2.1; Platelets 343; Potassium 4.7; Sodium 143; TSH 0.503   Lipid Panel    Component Value Date/Time   CHOL 90 01/03/2015 0210   TRIG 77 01/03/2015 0210   HDL 15 (L) 01/03/2015 0210   CHOLHDL 6.0 01/03/2015 0210   VLDL 15 01/03/2015 0210   LDLCALC 60 01/03/2015 0210    Additional studies/ records that were reviewed today  include:   Echocardiogram: 01/16/20 1. Left ventricular ejection fraction, by estimation, is 60 to 65%. The  left ventricle has normal function. The left ventricle has no regional  wall motion abnormalities. There is mild concentric left ventricular  hypertrophy. Left ventricular diastolic  parameters are consistent with Grade I diastolic dysfunction (impaired  relaxation). Elevated left ventricular end-diastolic pressure.  2. Right ventricular systolic function is normal. The right ventricular  size is normal. There is normal pulmonary artery systolic pressure.  3. The mitral valve is normal in structure. Trivial mitral valve  regurgitation. No evidence of mitral stenosis.  4. The aortic valve is normal in structure. Aortic valve regurgitation is  not visualized. No aortic stenosis is present.  5. Aortic dilatation noted. There is mild dilatation of the ascending  aorta measuring 38 mm.  6. The inferior vena cava is normal in size with greater than 50%  respiratory variability, suggesting right atrial pressure of 3 mmHg.    Left Heart Cath and Cors/Grafts Angiography 10/2016  Conclusion  1. Severe 2 vessel CAD with tight stenosis of a small left circumflex and total occlusion of the proximal RCA (large dominant vessel) 2. Nonobstructive left main and LAD stenosis 3. Normal LV function by noninvasive assessment 4. Heavily calcified coronary arteries  Plan:  Initial medical therapy (negative enzymes this admission)  If fails medical therapy, consider PCI of the proximal  LCx and CTO-PCI with atherectomy of the RCA  OK to discharge home tomorrow if no complications arise  Diagnostic Dominance: Right   ASSESSMENT & PLAN:    1.  Hypertension -Elevated but much better than last week.  However elevated blood pressure could be due to underlying anxiety and history of renal cell carcinoma.  After long discussion patient decided to increase amlodipine to 7.5 mg daily.  She  will keep log of blood pressure and review with PCP later this week.  She was doing lots of bleeding on the Internet about medication and other issue.  I have strongly advised her to stop that. Reports eating low sodium diet and has chronic LE edema since   2.  Murmur -No pulmonary abnormality by echocardiogram May 2021.  However noted a strong murmur on exam which is confirmed by DOD Dr. Meda Coffee.  Will repeat echocardiogram.  3.  CAD -No angina.  -Continue aspirin, Plavix and Lipitor.    Medication Adjustments/Labs and Tests Ordered: Current medicines are reviewed at length with the patient today.  Concerns regarding medicines are outlined above.  Medication changes, Labs and Tests ordered today are listed in the Patient Instructions below. Patient Instructions  Medication Instructions:  Your physician has recommended you make the following change in your medication:  1.  INCREASE the Amlodipine to 5 mg taking 1 1/2 tablet daily    *If you need a refill on your cardiac medications before your next appointment, please call your pharmacy*   Lab Work: None ordered  If you have labs (blood work) drawn today and your tests are completely normal, you will receive your results only by:  [place] (if you have MyChart) OR  A paper copy in the mail If you have any lab test that is abnormal or we need to change your treatment, we will call you to review the results.   Testing/Procedures: Your physician has requested that you have an echocardiogram. Echocardiography is a painless test that uses sound waves to create images of your heart. It provides your doctor with information about the size and shape of your heart and how well your hearts chambers and valves are working. This procedure takes approximately one hour. There are no restrictions for this procedure.     Follow-Up: At [facility], you and your health needs are our priority.  As part of our continuing mission to  provide you with exceptional heart care, we have created designated Provider Care Teams.  These Care Teams include your primary Cardiologist (physician) and Advanced Practice Providers (APPs -  Physician Assistants and Nurse Practitioners) who all work together to provide you with the care you need, when you need it.  We recommend signing up for the patient portal called "MyChart".  Sign up information is provided on this After Visit Summary.  MyChart is used to connect with patients for Virtual Visits (Telemedicine).  Patients are able to view lab/test results, encounter notes, upcoming appointments, etc.  Non-urgent messages can be sent to your provider as well.   To learn more about what you can do with MyChart, go to [email or web address].    Your next appointment:   3-4 month(s)  The format for your next appointment:   In Person  Provider:   You will see one of the following Advanced Practice Providers on your designated Care Team:    Robbie Lis, [place]     Other Instructions  Echocardiogram An echocardiogram is a procedure that uses painless sound waves (ultrasound)  to produce an image of the heart. Images from an echocardiogram can provide important information about:  Signs of coronary artery disease (CAD).  Aneurysm detection. An aneurysm is a weak or damaged part of an artery wall that bulges out from the normal force of blood pumping through the body.  Heart size and shape. Changes in the size or shape of the heart can be associated with certain conditions, including heart failure, aneurysm, and CAD.  Heart muscle function.  Heart valve function.  Signs of a past heart attack.  Fluid buildup around the heart.  Thickening of the heart muscle.  A tumor or infectious growth around the heart valves. Tell a health care provider about:  Any allergies you have.  All medicines you are taking, including vitamins, herbs, eye drops, creams, and over-the-counter  medicines.  Any blood disorders you have.  Any surgeries you have had.  Any medical conditions you have.  Whether you are pregnant or may be pregnant. What are the risks? Generally, this is a safe procedure. However, problems may occur, including:  Allergic reaction to dye (contrast) that may be used during the procedure. What happens before the procedure? No specific preparation is needed. You may eat and drink normally. What happens during the procedure?   An IV tube may be inserted into one of your veins.  You may receive contrast through this tube. A contrast is an injection that improves the quality of the pictures from your heart.  A gel will be applied to your chest.  A wand-like tool (transducer) will be moved over your chest. The gel will help to transmit the sound waves from the transducer.  The sound waves will harmlessly bounce off of your heart to allow the heart images to be captured in real-time motion. The images will be recorded on a computer. The procedure may vary among health care providers and hospitals. What happens after the procedure?  You may return to your normal, everyday life, including diet, activities, and medicines, unless your health care provider tells you not to do that. Summary  An echocardiogram is a procedure that uses painless sound waves (ultrasound) to produce an image of the heart.  Images from an echocardiogram can provide important information about the size and shape of your heart, heart muscle function, heart valve function, and fluid buildup around your heart.  You do not need to do anything to prepare before this procedure. You may eat and drink normally.  After the echocardiogram is completed, you may return to your normal, everyday life, unless your health care provider tells you not to do that. This information is not intended to replace advice given to you by your health care provider. Make sure you discuss any questions you  have with your health care provider. Document Revised: 12/13/2018 Document Reviewed: 09/24/2016 Elsevier Patient Education  2020 [place], [place], [place]  05/26/2020 11:53 AM    [facility] [place], [place], [place]  [address] Phone: [phone]; Fax: [phone]

## 2020-05-26 NOTE — Patient Instructions (Addendum)
Medication Instructions:  Your physician has recommended you make the following change in your medication:  1.  INCREASE the Amlodipine to 5 mg taking 1 1/2 tablet daily    *If you need a refill on your cardiac medications before your next appointment, please call your pharmacy*   Lab Work: None ordered  If you have labs (blood work) drawn today and your tests are completely normal, you will receive your results only by: Marland Kitchen MyChart Message (if you have MyChart) OR . A paper copy in the mail If you have any lab test that is abnormal or we need to change your treatment, we will call you to review the results.   Testing/Procedures: Your physician has requested that you have an echocardiogram. Echocardiography is a painless test that uses sound waves to create images of your heart. It provides your doctor with information about the size and shape of your heart and how well your heart's chambers and valves are working. This procedure takes approximately one hour. There are no restrictions for this procedure.     Follow-Up: At [facility], you and your health needs are our priority.  As part of our continuing mission to provide you with exceptional heart care, we have created designated Provider Care Teams.  These Care Teams include your primary Cardiologist (physician) and Advanced Practice Providers (APPs -  Physician Assistants and Nurse Practitioners) who all work together to provide you with the care you need, when you need it.  We recommend signing up for the patient portal called "MyChart".  Sign up information is provided on this After Visit Summary.  MyChart is used to connect with patients for Virtual Visits (Telemedicine).  Patients are able to view lab/test results, encounter notes, upcoming appointments, etc.  Non-urgent messages can be sent to your provider as well.   To learn more about what you can do with MyChart, go to [email or web address].    Your next appointment:     3-4 month(s)  The format for your next appointment:   In Person  Provider:   You will see one of the following Advanced Practice Providers on your designated Care Team:    Robbie Lis, [place]     Other Instructions  Echocardiogram An echocardiogram is a procedure that uses painless sound waves (ultrasound) to produce an image of the heart. Images from an echocardiogram can provide important information about:  Signs of coronary artery disease (CAD).  Aneurysm detection. An aneurysm is a weak or damaged part of an artery wall that bulges out from the normal force of blood pumping through the body.  Heart size and shape. Changes in the size or shape of the heart can be associated with certain conditions, including heart failure, aneurysm, and CAD.  Heart muscle function.  Heart valve function.  Signs of a past heart attack.  Fluid buildup around the heart.  Thickening of the heart muscle.  A tumor or infectious growth around the heart valves. Tell a health care provider about:  Any allergies you have.  All medicines you are taking, including vitamins, herbs, eye drops, creams, and over-the-counter medicines.  Any blood disorders you have.  Any surgeries you have had.  Any medical conditions you have.  Whether you are pregnant or may be pregnant. What are the risks? Generally, this is a safe procedure. However, problems may occur, including:  Allergic reaction to dye (contrast) that may be used during the procedure. What happens before the procedure? No specific preparation is needed. You  may eat and drink normally. What happens during the procedure?   An IV tube may be inserted into one of your veins.  You may receive contrast through this tube. A contrast is an injection that improves the quality of the pictures from your heart.  A gel will be applied to your chest.  A wand-like tool (transducer) will be moved over your chest. The gel will help to transmit  the sound waves from the transducer.  The sound waves will harmlessly bounce off of your heart to allow the heart images to be captured in real-time motion. The images will be recorded on a computer. The procedure may vary among health care providers and hospitals. What happens after the procedure?  You may return to your normal, everyday life, including diet, activities, and medicines, unless your health care provider tells you not to do that. Summary  An echocardiogram is a procedure that uses painless sound waves (ultrasound) to produce an image of the heart.  Images from an echocardiogram can provide important information about the size and shape of your heart, heart muscle function, heart valve function, and fluid buildup around your heart.  You do not need to do anything to prepare before this procedure. You may eat and drink normally.  After the echocardiogram is completed, you may return to your normal, everyday life, unless your health care provider tells you not to do that. This information is not intended to replace advice given to you by your health care provider. Make sure you discuss any questions you have with your health care provider. Document Revised: 12/13/2018 Document Reviewed: 09/24/2016 Elsevier Patient Education  [place].

## 2020-05-26 NOTE — Telephone Encounter (Signed)
Patient currently in virtual visit with APP.

## 2020-05-28 ENCOUNTER — Ambulatory Visit (INDEPENDENT_AMBULATORY_CARE_PROVIDER_SITE_OTHER): Payer: Medicare Other | Admitting: Family Medicine

## 2020-05-28 ENCOUNTER — Other Ambulatory Visit: Payer: Self-pay

## 2020-05-28 VITALS — BP 140/80 | HR 65 | Temp 97.5°F | Ht 68.0 in | Wt 219.0 lb

## 2020-05-28 DIAGNOSIS — I25708 Atherosclerosis of coronary artery bypass graft(s), unspecified, with other forms of angina pectoris: Secondary | ICD-10-CM | POA: Diagnosis not present

## 2020-05-28 DIAGNOSIS — Z951 Presence of aortocoronary bypass graft: Secondary | ICD-10-CM | POA: Diagnosis not present

## 2020-05-28 DIAGNOSIS — I10 Essential (primary) hypertension: Secondary | ICD-10-CM | POA: Diagnosis not present

## 2020-05-28 MED ORDER — LEVOTHYROXINE SODIUM 150 MCG PO TABS
150.0000 ug | ORAL_TABLET | Freq: Every day | ORAL | 5 refills | Status: DC
Start: 2020-05-28 — End: 2020-09-23

## 2020-05-28 MED ORDER — AMLODIPINE BESYLATE 10 MG PO TABS: 10.0000 mg | ORAL_TABLET | Freq: Every day | ORAL | 3 refills | Status: DC

## 2020-05-28 NOTE — Progress Notes (Signed)
Subjective:    Patient ID: Sheri Hernandez, female    DOB: 1955-12-16, 64 y.o.   MRN: [id number]  HPI Patient was seen in the emergency room on September 17.  Patient was having chest pain.  Was diagnosed with hypertension and was started on amlodipine 5 mg daily.  I reviewed the ER provider's note in detail.  Afterward, the patient saw cardiology who recommended increasing the amlodipine to 7.5 mg a day.  She is here today for follow-up.   Patient seems very anxious today.  I rechecked her blood pressure and found to be 152/80 although much better than it was the other night in the emergency room.  Patient states that at home her diastolic numbers have been greater than 100.  I believe that she would benefit from increasing the amlodipine to 10 mg a day.  She is concerned because she already has swelling in her ankles.  She denies any angina today.  She does have a known history of coronary artery disease that is currently being managed medically. Past Medical History:  Diagnosis Date  . Acute blood loss anemia 01/12/2015  . Adenocarcinoma, renal cell ([place]) 11/12/2012   Overview:   09/25/12 - CXR - lung masses  CT CAP - rt hilar mass, mult pulm nodules, LLL mass 3 x 3.1 cm, RLL mass 2.2 x 2.0 cm, left kidney mass 12.4 x 11.1 x 10.3 cm  10/05/12 PET - left lung mass 30 mm SUV 16.7, rt lung nodule 21 mm, SUV 10.4, left kidney mass 7 cm SUV 18, mass compressing RLL bronchus  10/11/12 lung biopsy - atypical cells  10/23/12 left kidney biopsy - high gr clear cell RCC   . Anxiety   . Arthritis    "maybe in my right hand" (05/26/2017)  . CAD (coronary artery disease)    a. 12/2014 Cath: LM nl, LAD 20p, 61m, D2 20ost, LCX 95p, 29m, RCA dominant, 95p, 56m/d, RPDA 95, RPL2 50, attempted RCA PCI w/ acute RCA occlusion-->CABG x 2 VG->RPDA->PLVB.  Marland Kitchen Chronic diastolic CHF (congestive heart failure) ([place])   . Drug-induced hypothyroidism 03/13/2014  . GERD (gastroesophageal reflux disease) 03/18/2015  . Hyperglycemia  01/12/2015  . Hypertension   . Hypoalbuminemia   . Hypothyroidism   . Lower extremity edema   . Lung mass    LUNG MASSES--COUGH-BIOPSY NON-DIAGNOSTIC.  HX OF MEDIASTINAL MASS AND LUNG MASSES AGE 53 -NEGATIVE BIOSPY-BUT GIVEN DIAGNOSIS OF SARCOIDOSIS.  WORK UP CONTINUING ON DIAGNOSIS FOR PT'S LUNG MASSES--SHE HAS BIOPSY PROVEN KIDNEY CANCER  . Mass of left breast 08/01/2011  . Myocardial infarction ([place]) 12/2014 X 2  . 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  . Papilloma of breast 2012   left. lumpectomy  . Pulmonary nodules   . Renal cell cancer [facility])    metastatic renal cell carcinoma (biopsy of left renal mass 10/2012) On nivolumab at [facility]  . Renal mass 10/01/2012   10/23/12 Bx Pos high grad clear cell renal cell ca> referred to oncology 10/26/2012    . S/p nephrectomy    left, due to metastatic RCC, 3/14   Past Surgical History:  Procedure Laterality Date  . BREAST BIOPSY  08/18/2011   Procedure: BREAST BIOPSY WITH NEEDLE LOCALIZATION;  Surgeon: Luella Cook III,  MD;  Location: [facility];  Service: General;  Laterality: Left;  left breast biopsy with needle localization  . BRONCHIAL BRUSH BIOPSY  2014  . CARDIAC CATHETERIZATION    . CORONARY ARTERY BYPASS GRAFT N/A 01/02/2015   Procedure: CORONARY ARTERY BYPASS GRAFTING (CABG)TIMES TWO USING RIGHT GREATER SAPHENOUS LEG VEIN HARVESTED ENDOSCOPICALLY;  Surgeon: [name], MD;  Location: [facility];  Service: Open Heart Surgery;  Laterality: N/A;  . LAPAROSCOPIC NEPHRECTOMY Left 11/22/2012   Procedure: LAPAROSCOPIC NEPHRECTOMY;  Surgeon: Dutch Gray, MD;  Location: [facility];  Service: Urology;  Laterality: Left;  . LEFT HEART CATH AND CORS/GRAFTS ANGIOGRAPHY N/A 10/11/2016   Procedure: Left Heart Cath and Cors/Grafts Angiography;  Surgeon: Sherren Mocha, MD;  Location: [facility] CV LAB;  Service: Cardiovascular;  Laterality: N/A;  . LEFT HEART CATHETERIZATION WITH CORONARY ANGIOGRAM N/A 01/02/2015   Procedure: LEFT HEART CATHETERIZATION WITH CORONARY ANGIOGRAM;  Surgeon: Leonie Man, MD;  Location: [facility] CATH LAB;  Service: Cardiovascular;  Laterality: N/A;  . NECK LESION BIOPSY Left 08/2016  . PORTA CATH INSERTION Right ~ 2017  . TONSILLECTOMY  1967  . VIDEO BRONCHOSCOPY  10/11/2012   Procedure: VIDEO BRONCHOSCOPY WITH FLUORO;  Surgeon: Kathee Delton, MD;  Location: [facility] ENDOS[facility];  Service: Cardiopulmonary;  Laterality: Bilateral;  . WIDS    . WISDOM TOOTH EXTRACTION     "all 4"   Current Outpatient Medications on File Prior to Visit  Medication Sig Dispense Refill  . Acetaminophen (TYLENOL PO) Take 2 tablets by mouth every 6 (six) hours as needed (headache).     Marland Kitchen acetaminophen (TYLENOL) 500 MG tablet Take 500-1,000 mg by mouth every 6 (six) hours as needed for mild pain or headache.    Marland Kitchen amLODipine (NORVASC) 5 MG tablet Take 1 1/2 tablet by mouth daily 135 tablet 3  . aspirin 81 MG EC tablet Take 1 tablet (81 mg total) by mouth daily.    Marland Kitchen atorvastatin (LIPITOR) 40 MG tablet TAKE 1 TABLET (40 MG TOTAL) BY MOUTH DAILY. 90 tablet 3  . Cholecalciferol (VITAMIN D PO) Take 1 tablet by mouth daily.     . Cholecalciferol (VITAMIN D-3) 25 MCG (1000 UT) CAPS Take 1,000 Units by mouth daily with breakfast.    . clopidogrel (PLAVIX) 75 MG tablet TAKE 1 TABLET BY MOUTH EVERY DAY (Patient taking differently: Take 75 mg by mouth daily. ) 90 tablet 3  . furosemide (LASIX) 40 MG tablet Take 1 tablet (40 mg total) by mouth daily. 90 tablet 3  . HYDROcodone-acetaminophen (NORCO/VICODIN) 5-325 MG tablet Take 1 tablet by mouth every 6 (six) hours as needed (for pain).     . isosorbide mononitrate (IMDUR) 120 MG 24 hr tablet Take 1 tablet (120 mg total) by mouth daily. 90 tablet 3  . levothyroxine (SYNTHROID) 150 MCG tablet Take 1 tablet (150 mcg  total) by mouth daily before breakfast. 30 tablet 0  . lidocaine-prilocaine (EMLA) cream Apply 1 application topically See admin instructions. APPLY CREAM TO PORTACATH SITE APPROXIMATELY 1 HOUR BEFORE ACCESS AND COVER    . Menthol (RICOLA) LOZG Use as directed 1 lozenge in the mouth or throat as needed (for a sore throat).    . metoprolol tartrate (LOPRESSOR) 100 MG tablet TAKE 1 TABLET BY MOUTH 2 TIMES DAILY. MAKE YEARLY APPT WITH DR. Burt Knack FOR JUNE FOR FUTURE REFILLS. (Patient taking differently: Take 100 mg by mouth 2 (two) times daily. ) 180 tablet 2  . nitroGLYCERIN (NITROSTAT) 0.4 MG SL tablet  PLACE 1 TABLET UNDER THE TONGUE EVERY 5 MINUTES AS NEEDED FOR CHEST PAIN (Patient taking differently: Place 0.4 mg under the tongue every 5 (five) minutes as needed for chest pain. ) 25 tablet 6  . Nivolumab (OPDIVO IV) Inject into the vein every 30 (thirty) days. Chemo therapy    . ondansetron (ZOFRAN ODT) 4 MG disintegrating tablet Take 1 tablet (4 mg total) by mouth every 8 (eight) hours as needed for nausea or vomiting. (Patient taking differently: Take 4 mg by mouth every 8 (eight) hours as needed for nausea or vomiting (DISSOLVE ORALLY). ) 20 tablet 0  . oxycodone (OXY-IR) 5 MG capsule Take 5 mg by mouth daily as needed (severe pain). pain     No current facility-administered medications on file prior to visit.   Allergies  Allergen Reactions  . Silver Dermatitis and Other (See Comments)    Please use Mepilex bandaging ("brown/tan, not silver")  . Tape Other (See Comments)    PLEASE DO NOT USE "PLASTIC" TAPE!! IT "RIPS OFF MY SKIN"   Social History   Socioeconomic History  . Marital status: Married    Spouse name: Not on file  . Number of children: 0  . Years of education: Not on file  . Highest education level: Not on file  Occupational History  . 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   Tobacco Use  . Smoking status: Former Smoker    Packs/day: 1.50     Years: 40.00    Pack years: 60.00    Types: Cigarettes    Quit date: 09/24/2012    Years since quitting: 7.6  . Smokeless tobacco: Never Used  Vaping Use  . Vaping Use: Never used  Substance and Sexual Activity  . Alcohol use: Yes    Comment: 05/26/2017 "couple drinks/year"  . Drug use: No  . Sexual activity: Yes  Other Topics Concern  . Not on file  Social History Narrative  . Not on file   Social Determinants of Health   Financial Resource Strain:   . Difficulty of Paying Living Expenses: Not on file  Food Insecurity:   . Worried About Charity fundraiser in the Last Year: Not on file  . Ran Out of Food in the Last Year: Not on file  Transportation Needs:   . Lack of Transportation (Medical): Not on file  . Lack of Transportation (Non-Medical): Not on file  Physical Activity:   . Days of Exercise per Week: Not on file  . Minutes of Exercise per Session: Not on file  Stress:   . Feeling of Stress : Not on file  Social Connections:   . Frequency of Communication with Friends and Family: Not on file  . Frequency of Social Gatherings with Friends and Family: Not on file  . Attends Religious Services: Not on file  . Active Member of Clubs or Organizations: Not on file  . Attends Archivist Meetings: Not on file  . Marital Status: Not on file  Intimate Partner Violence:   . Fear of Current or Ex-Partner: Not on file  . Emotionally Abused: Not on file  . Physically Abused: Not on file  . Sexually Abused: Not on file      Review of Systems  All other systems reviewed and are negative.      Objective:   Physical Exam Vitals reviewed.  Constitutional:      Appearance: She is well-developed.  HENT:  Right Ear: External ear normal.     Left Ear: External ear normal.     Nose: Nose normal.     Mouth/Throat:     Pharynx: No oropharyngeal exudate.  Cardiovascular:     Rate and Rhythm: Normal rate and regular rhythm.     Heart sounds: Normal heart  sounds. No murmur heard.   Pulmonary:     Effort: Pulmonary effort is normal. No respiratory distress.     Breath sounds: Normal breath sounds. No wheezing or rales.  Musculoskeletal:     Cervical back: Neck supple.     Right lower leg: Edema present.     Left lower leg: Edema present.  Lymphadenopathy:     Cervical: No cervical adenopathy.           Assessment & Plan:  Essential hypertension  S/P CABG x 2  Given her known history of coronary artery disease, I have recommended increasing amlodipine to 10 mg a day and then rechecking blood pressure in 1 week.  I did refill her levothyroxine as this is due as she had a normal TSH at the hospital on the 17th.  Also recommended the patient receive the Covid vaccination even with natural immunity per CDC guidelines.

## 2020-05-29 ENCOUNTER — Telehealth: Payer: Self-pay | Admitting: Family Medicine

## 2020-05-29 NOTE — Telephone Encounter (Signed)
eror

## 2020-06-03 ENCOUNTER — Other Ambulatory Visit: Payer: Self-pay

## 2020-06-03 ENCOUNTER — Ambulatory Visit (HOSPITAL_COMMUNITY): Payer: Medicare Other | Attending: Internal Medicine

## 2020-06-03 DIAGNOSIS — R0602 Shortness of breath: Secondary | ICD-10-CM | POA: Insufficient documentation

## 2020-06-03 LAB — ECHOCARDIOGRAM COMPLETE
Area-P 1/2: 3.42 cm2
S' Lateral: 2.7 cm

## 2020-06-05 ENCOUNTER — Ambulatory Visit (INDEPENDENT_AMBULATORY_CARE_PROVIDER_SITE_OTHER): Payer: Medicare Other | Admitting: Family Medicine

## 2020-06-05 ENCOUNTER — Other Ambulatory Visit: Payer: Self-pay

## 2020-06-05 VITALS — BP 140/80 | HR 82 | Temp 97.3°F | Ht 68.0 in | Wt 219.0 lb

## 2020-06-05 DIAGNOSIS — I25708 Atherosclerosis of coronary artery bypass graft(s), unspecified, with other forms of angina pectoris: Secondary | ICD-10-CM | POA: Diagnosis not present

## 2020-06-05 DIAGNOSIS — I10 Essential (primary) hypertension: Secondary | ICD-10-CM

## 2020-06-05 NOTE — Progress Notes (Signed)
Subjective:    Patient ID: Sheri Hernandez, female    DOB: 12-23-1955, 64 y.o.   MRN: [id number]  HPI  05/28/20 Patient was seen in the emergency room on September 17.  Patient was having chest pain.  Was diagnosed with hypertension and was started on amlodipine 5 mg daily.  I reviewed the ER provider's note in detail.  Afterward, the patient saw cardiology who recommended increasing the amlodipine to 7.5 mg a day.  She is here today for follow-up.   Patient seems very anxious today.  I rechecked her blood pressure and found to be 152/80 although much better than it was the other night in the emergency room.  Patient states that at home her diastolic numbers have been greater than 100.  I believe that she would benefit from increasing the amlodipine to 10 mg a day.  She is concerned because she already has swelling in her ankles.  She denies any angina today.  She does have a known history of coronary artery disease that is currently being managed medically.  At that time, my plan was: Given her known history of coronary artery disease, I have recommended increasing amlodipine to 10 mg a day and then rechecking blood pressure in 1 week.  I did refill her levothyroxine as this is due as she had a normal TSH at the hospital on the 17th.  Also recommended the patient receive the Covid vaccination even with natural immunity per CDC guidelines.  06/05/20 Patient has been checking her blood pressure frequently since last week.  She did not increase amlodipine to 10 mg.  She is still taking 7.5 mg.  Systolic blood pressure has varied between 140 and 160 averaging around 150.  Diastolic blood pressure has been 70-100 but primarily above 90. Past Medical History:  Diagnosis Date  . Acute blood loss anemia 01/12/2015  . Adenocarcinoma, renal cell ([place]) 11/12/2012   Overview:   09/25/12 - CXR - lung masses  CT CAP - rt hilar mass, mult pulm nodules, LLL mass 3 x 3.1 cm, RLL mass 2.2 x 2.0 cm, left kidney mass  12.4 x 11.1 x 10.3 cm  10/05/12 PET - left lung mass 30 mm SUV 16.7, rt lung nodule 21 mm, SUV 10.4, left kidney mass 7 cm SUV 18, mass compressing RLL bronchus  10/11/12 lung biopsy - atypical cells  10/23/12 left kidney biopsy - high gr clear cell RCC   . Anxiety   . Arthritis    "maybe in my right hand" (05/26/2017)  . CAD (coronary artery disease)    a. 12/2014 Cath: LM nl, LAD 20p, 62m, D2 20ost, LCX 95p, 67m, RCA dominant, 95p, 58m/d, RPDA 95, RPL2 50, attempted RCA PCI w/ acute RCA occlusion-->CABG x 2 VG->RPDA->PLVB.  Marland Kitchen Chronic diastolic CHF (congestive heart failure) ([place])   . Drug-induced hypothyroidism 03/13/2014  . GERD (gastroesophageal reflux disease) 03/18/2015  . Hyperglycemia 01/12/2015  . Hypertension   . Hypoalbuminemia   . Hypothyroidism   . Lower extremity edema   . Lung mass    LUNG MASSES--COUGH-BIOPSY NON-DIAGNOSTIC.  HX OF MEDIASTINAL MASS AND LUNG MASSES AGE 72 -NEGATIVE BIOSPY-BUT GIVEN DIAGNOSIS OF SARCOIDOSIS.  WORK UP CONTINUING ON DIAGNOSIS FOR PT'S LUNG MASSES--SHE HAS BIOPSY PROVEN KIDNEY CANCER  . Mass of left breast 08/01/2011  . Myocardial infarction ([place]) 12/2014 X 2  . 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  . Papilloma of breast 2012   left. lumpectomy  . Pulmonary nodules   . Renal cell cancer [facility])    metastatic renal cell carcinoma (biopsy of left renal mass 10/2012) On nivolumab at [facility]  . Renal mass 10/01/2012   10/23/12 Bx Pos high grad clear cell renal cell ca> referred to oncology 10/26/2012    . S/p nephrectomy    left, due to metastatic RCC, 3/14   Past Surgical History:  Procedure Laterality Date  . BREAST BIOPSY  08/18/2011   Procedure: BREAST BIOPSY WITH NEEDLE LOCALIZATION;  Surgeon: Merrie Roof, MD;  Location: [facility];  Service: General;   Laterality: Left;  left breast biopsy with needle localization  . BRONCHIAL BRUSH BIOPSY  2014  . CARDIAC CATHETERIZATION    . CORONARY ARTERY BYPASS GRAFT N/A 01/02/2015   Procedure: CORONARY ARTERY BYPASS GRAFTING (CABG)TIMES TWO USING RIGHT GREATER SAPHENOUS LEG VEIN HARVESTED ENDOSCOPICALLY;  Surgeon: [name], MD;  Location: [facility];  Service: Open Heart Surgery;  Laterality: N/A;  . LAPAROSCOPIC NEPHRECTOMY Left 11/22/2012   Procedure: LAPAROSCOPIC NEPHRECTOMY;  Surgeon: Dutch Gray, MD;  Location: [facility];  Service: Urology;  Laterality: Left;  . LEFT HEART CATH AND CORS/GRAFTS ANGIOGRAPHY N/A 10/11/2016   Procedure: Left Heart Cath and Cors/Grafts Angiography;  Surgeon: Sherren Mocha, MD;  Location: [facility] CV [facility];  Service: Cardiovascular;  Laterality: N/A;  . LEFT HEART CATHETERIZATION WITH CORONARY ANGIOGRAM N/A 01/02/2015   Procedure: LEFT HEART CATHETERIZATION WITH CORONARY ANGIOGRAM;  Surgeon: Leonie Man, MD;  Location: [facility] CATH LAB;  Service: Cardiovascular;  Laterality: N/A;  . NECK LESION BIOPSY Left 08/2016  . PORTA CATH INSERTION Right ~ 2017  . TONSILLECTOMY  1967  . VIDEO BRONCHOSCOPY  10/11/2012   Procedure: VIDEO BRONCHOSCOPY WITH FLUORO;  Surgeon: Kathee Delton, MD;  Location: [facility];  Service: Cardiopulmonary;  Laterality: Bilateral;  . WIDS    . WISDOM TOOTH EXTRACTION     "all 4"   Current Outpatient Medications on File Prior to Visit  Medication Sig Dispense Refill  . Acetaminophen (TYLENOL PO) Take 2 tablets by mouth every 6 (six) hours as needed (headache).     Marland Kitchen acetaminophen (TYLENOL) 500 MG tablet Take 500-1,000 mg by mouth every 6 (six) hours as needed for mild pain or headache.    Marland Kitchen amLODipine (NORVASC) 10 MG tablet Take 1 tablet (10 mg total) by mouth daily. 90 tablet 3  . amLODipine (NORVASC) 5 MG tablet Take 1 1/2 tablet by mouth daily 135 tablet 3  . aspirin 81 MG EC tablet Take 1 tablet (81 mg total) by mouth daily.    Marland Kitchen atorvastatin  (LIPITOR) 40 MG tablet TAKE 1 TABLET (40 MG TOTAL) BY MOUTH DAILY. 90 tablet 3  . Cholecalciferol (VITAMIN D PO) Take 1 tablet by mouth daily.     . Cholecalciferol (VITAMIN D-3) 25 MCG (1000 UT) CAPS Take 1,000 Units by mouth daily with breakfast.    . clopidogrel (PLAVIX) 75 MG tablet TAKE 1 TABLET BY MOUTH EVERY DAY (Patient taking differently: Take 75 mg by mouth daily. ) 90 tablet 3  . furosemide (LASIX) 40 MG tablet Take 1 tablet (40 mg total) by mouth daily. 90 tablet 3  . HYDROcodone-acetaminophen (NORCO/VICODIN) 5-325 MG tablet Take 1 tablet by mouth every 6 (six) hours as needed (for pain).     Marland Kitchen  isosorbide mononitrate (IMDUR) 120 MG 24 hr tablet Take 1 tablet (120 mg total) by mouth daily. 90 tablet 3  . levothyroxine (SYNTHROID) 150 MCG tablet Take 1 tablet (150 mcg total) by mouth daily before breakfast. 30 tablet 5  . lidocaine-prilocaine (EMLA) cream Apply 1 application topically See admin instructions. APPLY CREAM TO PORTACATH SITE APPROXIMATELY 1 HOUR BEFORE ACCESS AND COVER    . Menthol (RICOLA) LOZG Use as directed 1 lozenge in the mouth or throat as needed (for a sore throat).    . metoprolol tartrate (LOPRESSOR) 100 MG tablet TAKE 1 TABLET BY MOUTH 2 TIMES DAILY. MAKE YEARLY APPT WITH DR. Burt Knack FOR JUNE FOR FUTURE REFILLS. (Patient taking differently: Take 100 mg by mouth 2 (two) times daily. ) 180 tablet 2  . nitroGLYCERIN (NITROSTAT) 0.4 MG SL tablet PLACE 1 TABLET UNDER THE TONGUE EVERY 5 MINUTES AS NEEDED FOR CHEST PAIN (Patient taking differently: Place 0.4 mg under the tongue every 5 (five) minutes as needed for chest pain. ) 25 tablet 6  . Nivolumab (OPDIVO IV) Inject into the vein every 30 (thirty) days. Chemo therapy    . ondansetron (ZOFRAN ODT) 4 MG disintegrating tablet Take 1 tablet (4 mg total) by mouth every 8 (eight) hours as needed for nausea or vomiting. (Patient taking differently: Take 4 mg by mouth every 8 (eight) hours as needed for nausea or vomiting  (DISSOLVE ORALLY). ) 20 tablet 0  . oxycodone (OXY-IR) 5 MG capsule Take 5 mg by mouth daily as needed (severe pain). pain     No current facility-administered medications on file prior to visit.   Allergies  Allergen Reactions  . Silver Dermatitis and Other (See Comments)    Please use Mepilex bandaging ("brown/tan, not silver")  . Tape Other (See Comments)    PLEASE DO NOT USE "PLASTIC" TAPE!! IT "RIPS OFF MY SKIN"   Social History   Socioeconomic History  . Marital status: Married    Spouse name: Not on file  . Number of children: 0  . Years of education: Not on file  . Highest education level: Not on file  Occupational History  . 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   Tobacco Use  . Smoking status: Former Smoker    Packs/day: 1.50    Years: 40.00    Pack years: 60.00    Types: Cigarettes    Quit date: 09/24/2012    Years since quitting: 7.7  . Smokeless tobacco: Never Used  Vaping Use  . Vaping Use: Never used  Substance and Sexual Activity  . Alcohol use: Yes    Comment: 05/26/2017 "couple drinks/year"  . Drug use: No  . Sexual activity: Yes  Other Topics Concern  . Not on file  Social History Narrative  . Not on file   Social Determinants of Health   Financial Resource Strain:   . Difficulty of Paying Living Expenses: Not on file  Food Insecurity:   . Worried About Charity fundraiser in the Last Year: Not on file  . Ran Out of Food in the Last Year: Not on file  Transportation Needs:   . Lack of Transportation (Medical): Not on file  . Lack of Transportation (Non-Medical): Not on file  Physical Activity:   . Days of Exercise per Week: Not on file  . Minutes of Exercise per Session: Not on file  Stress:   . Feeling of Stress : Not on file  Social Connections:   .  Frequency of Communication with Friends and Family: Not on file  . Frequency of Social Gatherings with Friends and Family: Not on file  . Attends Religious  Services: Not on file  . Active Member of Clubs or Organizations: Not on file  . Attends Archivist Meetings: Not on file  . Marital Status: Not on file  Intimate Partner Violence:   . Fear of Current or Ex-Partner: Not on file  . Emotionally Abused: Not on file  . Physically Abused: Not on file  . Sexually Abused: Not on file      Review of Systems  All other systems reviewed and are negative.      Objective:   Physical Exam Vitals reviewed.  Constitutional:      Appearance: She is well-developed.  Cardiovascular:     Rate and Rhythm: Normal rate and regular rhythm.     Heart sounds: Normal heart sounds. No murmur heard.   Pulmonary:     Effort: Pulmonary effort is normal. No respiratory distress.     Breath sounds: Normal breath sounds. No wheezing or rales.  Musculoskeletal:     Right lower leg: Edema present.     Left lower leg: Edema present.           Assessment & Plan:  Essential hypertension  Recommended the patient increase amlodipine to 10 mg a day and recheck blood pressure in 1 month.  Blood pressure has improved but is still not at goal.  Reviewed echocardiogram which showed an ejection fraction of 55 to 60%.

## 2020-06-15 ENCOUNTER — Telehealth: Payer: Self-pay | Admitting: Family Medicine

## 2020-06-15 NOTE — Telephone Encounter (Signed)
Left Pt vm returning her phone call.

## 2020-06-15 NOTE — Telephone Encounter (Signed)
CB# [phone]  Patient has a question about her medication.

## 2020-06-16 DIAGNOSIS — E1165 Type 2 diabetes mellitus with hyperglycemia: Secondary | ICD-10-CM | POA: Diagnosis not present

## 2020-06-16 DIAGNOSIS — R21 Rash and other nonspecific skin eruption: Secondary | ICD-10-CM | POA: Diagnosis not present

## 2020-06-16 DIAGNOSIS — E039 Hypothyroidism, unspecified: Secondary | ICD-10-CM | POA: Diagnosis not present

## 2020-06-16 DIAGNOSIS — I2510 Atherosclerotic heart disease of native coronary artery without angina pectoris: Secondary | ICD-10-CM | POA: Diagnosis not present

## 2020-06-16 DIAGNOSIS — C649 Malignant neoplasm of unspecified kidney, except renal pelvis: Secondary | ICD-10-CM | POA: Diagnosis not present

## 2020-06-16 DIAGNOSIS — Z951 Presence of aortocoronary bypass graft: Secondary | ICD-10-CM | POA: Diagnosis not present

## 2020-06-16 DIAGNOSIS — Z79899 Other long term (current) drug therapy: Secondary | ICD-10-CM | POA: Diagnosis not present

## 2020-06-16 DIAGNOSIS — C642 Malignant neoplasm of left kidney, except renal pelvis: Secondary | ICD-10-CM | POA: Diagnosis not present

## 2020-06-30 DIAGNOSIS — C642 Malignant neoplasm of left kidney, except renal pelvis: Secondary | ICD-10-CM | POA: Diagnosis not present

## 2020-08-12 ENCOUNTER — Other Ambulatory Visit: Payer: Self-pay

## 2020-08-12 ENCOUNTER — Ambulatory Visit (INDEPENDENT_AMBULATORY_CARE_PROVIDER_SITE_OTHER): Payer: Medicare Other | Admitting: Cardiovascular Disease

## 2020-08-12 ENCOUNTER — Encounter: Payer: Self-pay | Admitting: Cardiovascular Disease

## 2020-08-12 VITALS — BP 110/80 | HR 74 | Ht 68.0 in | Wt 225.0 lb

## 2020-08-12 DIAGNOSIS — I25119 Atherosclerotic heart disease of native coronary artery with unspecified angina pectoris: Secondary | ICD-10-CM

## 2020-08-12 DIAGNOSIS — E782 Mixed hyperlipidemia: Secondary | ICD-10-CM

## 2020-08-12 DIAGNOSIS — I25708 Atherosclerosis of coronary artery bypass graft(s), unspecified, with other forms of angina pectoris: Secondary | ICD-10-CM

## 2020-08-12 DIAGNOSIS — I10 Essential (primary) hypertension: Secondary | ICD-10-CM

## 2020-08-12 MED ORDER — FUROSEMIDE 40 MG PO TABS: 40.0000 mg | ORAL_TABLET | Freq: Every day | ORAL | 3 refills | Status: DC

## 2020-08-12 NOTE — Progress Notes (Signed)
Cardiology Office Note:    Date:  08/12/2020   ID:  Sheri Hernandez, DOB Jan 26, 1956, MRN [id number]  PCP:  Susy Frizzle, MD  [facility] Cardiologist:  Sherren Mocha, MD  [place] Electrophysiologist:  None   Referring MD: Susy Frizzle, MD   Chief Complaint  Patient presents with  . Coronary Artery Disease    History of Present Illness:    Sheri Hernandez is a 64 y.o. female with a hx of coronary artery disease, presenting for follow-up evaluation.  The patient has a complicated history, initially presenting with non-STEMI in 2016 and undergoing complex PCI of severe stenosis in the RCA complicated by extensive dissection requiring emergency CABG with the saphenous vein graft to PDA and PLA.  The patient has had recurrent angina and follow-up catheterization demonstrated occlusion of her bypass graft.  The RCA remained patent with tight residual stenosis and medical therapy was recommended in the setting of controlled symptoms.  Comorbid conditions include history of metastatic renal cell carcinoma status post left nephrectomy, breast cancer status post left lumpectomy, hypertension, and mixed hyperlipidemia.  She was last seen in our office in September of this year for follow-up after an emergency room visit with marked hypertension.  Amlodipine was increased and she was recommended to stay on a low-sodium diet.  An echocardiogram was done and this demonstrated no significant valvular disease and also normal LV function.  The patient is here alone today.  She is doing pretty well from a cardiac perspective.  She has not had any recent problems with chest pain, chest pressure, or shortness of breath.  Her chronic leg swelling is unchanged.  We talked about the possible association between amlodipine and leg swelling, but she has not noticed any difference on this medication.  She denies orthopnea, PND, or heart palpitations.  She has concerns about the COVID-19 vaccine  and we discussed recommendations regarding this in the context of her cardiac problems.  She has not taken any nitroglycerin since her last visit.  Past Medical History:  Diagnosis Date  . Acute blood loss anemia 01/12/2015  . Adenocarcinoma, renal cell ([place]) 11/12/2012   Overview:   09/25/12 - CXR - lung masses  CT CAP - rt hilar mass, mult pulm nodules, LLL mass 3 x 3.1 cm, RLL mass 2.2 x 2.0 cm, left kidney mass 12.4 x 11.1 x 10.3 cm  10/05/12 PET - left lung mass 30 mm SUV 16.7, rt lung nodule 21 mm, SUV 10.4, left kidney mass 7 cm SUV 18, mass compressing RLL bronchus  10/11/12 lung biopsy - atypical cells  10/23/12 left kidney biopsy - high gr clear cell RCC   . Anxiety   . Arthritis    "maybe in my right hand" (05/26/2017)  . CAD (coronary artery disease)    a. 12/2014 Cath: LM nl, LAD 20p, 8m, D2 20ost, LCX 95p, 20m, RCA dominant, 95p, 71m/d, RPDA 95, RPL2 50, attempted RCA PCI w/ acute RCA occlusion-->CABG x 2 VG->RPDA->PLVB.  Marland Kitchen Chronic diastolic CHF (congestive heart failure) ([place])   . Drug-induced hypothyroidism 03/13/2014  . GERD (gastroesophageal reflux disease) 03/18/2015  . Hyperglycemia 01/12/2015  . Hypertension   . Hypoalbuminemia   . Hypothyroidism   . Lower extremity edema   . Lung mass    LUNG MASSES--COUGH-BIOPSY NON-DIAGNOSTIC.  HX OF MEDIASTINAL MASS AND LUNG MASSES AGE 17 -NEGATIVE BIOSPY-BUT GIVEN DIAGNOSIS OF SARCOIDOSIS.  WORK UP CONTINUING ON DIAGNOSIS FOR PT'S LUNG MASSES--SHE HAS BIOPSY PROVEN KIDNEY CANCER  .  Mass of left breast 08/01/2011  . Myocardial infarction ([place]) 12/2014 X 2  . 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  . Papilloma of breast 2012   left. lumpectomy  . Pulmonary nodules   . Renal cell cancer [facility])    metastatic renal cell carcinoma (biopsy of  left renal mass 10/2012) On nivolumab at [facility]  . Renal mass 10/01/2012   10/23/12 Bx Pos high grad clear cell renal cell ca> referred to oncology 10/26/2012    . S/p nephrectomy    left, due to metastatic RCC, 3/14    Past Surgical History:  Procedure Laterality Date  . BREAST BIOPSY  08/18/2011   Procedure: BREAST BIOPSY WITH NEEDLE LOCALIZATION;  Surgeon: Merrie Roof, MD;  Location: [place];  Service: General;  Laterality: Left;  left breast biopsy with needle localization  . BRONCHIAL BRUSH BIOPSY  2014  . CARDIAC CATHETERIZATION    . CORONARY ARTERY BYPASS GRAFT N/A 01/02/2015   Procedure: CORONARY ARTERY BYPASS GRAFTING (CABG)TIMES TWO USING RIGHT GREATER SAPHENOUS LEG VEIN HARVESTED ENDOSCOPICALLY;  Surgeon: [name], MD;  Location: [place];  Service: Open Heart Surgery;  Laterality: N/A;  . LAPAROSCOPIC NEPHRECTOMY Left 11/22/2012   Procedure: LAPAROSCOPIC NEPHRECTOMY;  Surgeon: Dutch Gray, MD;  Location: [facility];  Service: Urology;  Laterality: Left;  . LEFT HEART CATH AND CORS/GRAFTS ANGIOGRAPHY N/A 10/11/2016   Procedure: Left Heart Cath and Cors/Grafts Angiography;  Surgeon: Sherren Mocha, MD;  Location: [place] [facility];  Service: Cardiovascular;  Laterality: N/A;  . LEFT HEART CATHETERIZATION WITH CORONARY ANGIOGRAM N/A 01/02/2015   Procedure: LEFT HEART CATHETERIZATION WITH CORONARY ANGIOGRAM;  Surgeon: Leonie Man, MD;  Location: [facility];  Service: Cardiovascular;  Laterality: N/A;  . NECK LESION BIOPSY Left 08/2016  . PORTA CATH INSERTION Right ~ 2017  . TONSILLECTOMY  1967  . VIDEO BRONCHOSCOPY  10/11/2012   Procedure: VIDEO BRONCHOSCOPY WITH FLUORO;  Surgeon: Kathee Delton, MD;  Location: [facility];  Service: Cardiopulmonary;  Laterality: Bilateral;  . WIDS    . WISDOM TOOTH EXTRACTION     "all 4"    Current Medications: Current Meds  Medication Sig  . acetaminophen (TYLENOL) 500 MG tablet Take 500-1,000 mg by mouth every 6 (six) hours as needed for  mild pain or headache.  Marland Kitchen amLODipine (NORVASC) 10 MG tablet Take 1 tablet (10 mg total) by mouth daily.  Marland Kitchen aspirin 81 MG EC tablet Take 1 tablet (81 mg total) by mouth daily.  Marland Kitchen atorvastatin (LIPITOR) 40 MG tablet TAKE 1 TABLET (40 MG TOTAL) BY MOUTH DAILY.  Marland Kitchen Cholecalciferol (VITAMIN D PO) Take 1 tablet by mouth daily.   . Cholecalciferol (VITAMIN D-3) 25 MCG (1000 UT) CAPS Take 1,000 Units by mouth daily with breakfast.  . clopidogrel (PLAVIX) 75 MG tablet TAKE 1 TABLET BY MOUTH EVERY DAY  . furosemide (LASIX) 40 MG tablet Take 1 tablet (40 mg total) by mouth daily.  Marland Kitchen HYDROcodone-acetaminophen (NORCO/VICODIN) 5-325 MG tablet Take 1 tablet by mouth every 6 (six) hours as needed (for pain).   . isosorbide mononitrate (IMDUR) 120 MG 24 hr tablet Take 1 tablet (120 mg total) by mouth daily.  Marland Kitchen levothyroxine (SYNTHROID) 150 MCG tablet Take 1 tablet (150 mcg total)  by mouth daily before breakfast.  . lidocaine-prilocaine (EMLA) cream Apply 1 application topically See admin instructions. APPLY CREAM TO PORTACATH SITE APPROXIMATELY 1 HOUR BEFORE ACCESS AND COVER  . Menthol (RICOLA) LOZG Use as directed 1 lozenge in the mouth or throat as needed (for a sore throat).  . metoprolol tartrate (LOPRESSOR) 100 MG tablet TAKE 1 TABLET BY MOUTH 2 TIMES DAILY. MAKE YEARLY APPT WITH DR. Burt Knack FOR JUNE FOR FUTURE REFILLS.  Marland Kitchen nitroGLYCERIN (NITROSTAT) 0.4 MG SL tablet PLACE 1 TABLET UNDER THE TONGUE EVERY 5 MINUTES AS NEEDED FOR CHEST PAIN  . ondansetron (ZOFRAN ODT) 4 MG disintegrating tablet Take 1 tablet (4 mg total) by mouth every 8 (eight) hours as needed for nausea or vomiting.  Marland Kitchen oxycodone (OXY-IR) 5 MG capsule Take 5 mg by mouth daily as needed (severe pain). pain  . [DISCONTINUED] furosemide (LASIX) 40 MG tablet Take 1 tablet (40 mg total) by mouth daily.     Allergies:   Silver and Tape   Social History   Socioeconomic History  . Marital status: Married    Spouse name: Not on file  . Number of  children: 0  . Years of education: Not on file  . Highest education level: Not on file  Occupational History  . 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   Tobacco Use  . Smoking status: Former Smoker    Packs/day: 1.50    Years: 40.00    Pack years: 60.00    Types: Cigarettes    Quit date: 09/24/2012    Years since quitting: 7.8  . Smokeless tobacco: Never Used  Vaping Use  . Vaping Use: Never used  Substance and Sexual Activity  . Alcohol use: Yes    Comment: 05/26/2017 "couple drinks/year"  . Drug use: No  . Sexual activity: Yes  Other Topics Concern  . Not on file  Social History Narrative  . Not on file   Social Determinants of Health   Financial Resource Strain:   . Difficulty of Paying Living Expenses: Not on file  Food Insecurity:   . Worried About Charity fundraiser in the Last Year: Not on file  . Ran Out of Food in the Last Year: Not on file  Transportation Needs:   . Lack of Transportation (Medical): Not on file  . Lack of Transportation (Non-Medical): Not on file  Physical Activity:   . Days of Exercise per Week: Not on file  . Minutes of Exercise per Session: Not on file  Stress:   . Feeling of Stress : Not on file  Social Connections:   . Frequency of Communication with Friends and Family: Not on file  . Frequency of Social Gatherings with Friends and Family: Not on file  . Attends Religious Services: Not on file  . Active Member of Clubs or Organizations: Not on file  . Attends Archivist Meetings: Not on file  . Marital Status: Not on file     Family History: The patient's family history includes Aortic dissection in her brother; Breast cancer in her maternal aunt; Cancer in her maternal grandfather; Colon cancer in her maternal aunt; Heart attack in her father; Heart disease in her brother, father, maternal grandmother, and mother; Skin cancer in her maternal uncle; Stomach cancer in her maternal uncle; Stroke in her  brother.  ROS:   Please see the history of present illness.    All other systems reviewed and are negative.  EKGs/Labs/Other Studies  Reviewed:    The following studies were reviewed today: Echo 06-03-2020: 1. Left ventricular ejection fraction, by estimation, is 55 to 60%. The  left ventricle has normal function. The left ventricle has no regional  wall motion abnormalities. Left ventricular diastolic parameters are  indeterminate. The average left  ventricular global longitudinal strain is -15.3 %. The global longitudinal  strain is abnormal.  2. Right ventricular systolic function is normal. The right ventricular  size is normal. There is normal pulmonary artery systolic pressure.  3. The mitral valve is normal in structure. Trivial mitral valve  regurgitation. No evidence of mitral stenosis.  4. The aortic valve was not well visualized. Aortic valve regurgitation  is trivial. No aortic stenosis is present.  5. The inferior vena cava is normal in size with greater than 50%  respiratory variability, suggesting right atrial pressure of 3 mmHg.   EKG:  EKG is not ordered today.    Recent Labs: 05/22/2020: ALT 23; BUN 17; Creatinine, Ser 0.70; Hemoglobin 16.0; Magnesium 2.1; Platelets 343; Potassium 4.7; Sodium 143; TSH 0.503  Recent Lipid Panel    Component Value Date/Time   CHOL 90 01/03/2015 0210   TRIG 77 01/03/2015 0210   HDL 15 (L) 01/03/2015 0210   CHOLHDL 6.0 01/03/2015 0210   VLDL 15 01/03/2015 0210   LDLCALC 60 01/03/2015 0210     Risk Assessment/Calculations:       Physical Exam:    VS:  BP 110/80   Pulse 74   Ht 5\' 8"  (1.727 m)   Wt 225 lb (102.1 kg)   SpO2 95%   BMI 34.21 kg/m     Wt Readings from Last 3 Encounters:  08/12/20 225 lb (102.1 kg)  06/05/20 219 lb (99.3 kg)  05/28/20 219 lb (99.3 kg)     GEN:  Well nourished, well developed in no acute distress HEENT: Normal NECK: No JVD; No carotid bruits LYMPHATICS: No  lymphadenopathy CARDIAC: RRR, 2/6 systolic murmur at the right upper sternal border RESPIRATORY:  Clear to auscultation without rales, wheezing or rhonchi  ABDOMEN: Soft, non-tender, non-distended MUSCULOSKELETAL:  1+ BL ankle edema; No deformity  SKIN: Warm and dry NEUROLOGIC:  Alert and oriented x 3 PSYCHIATRIC:  Normal affect   ASSESSMENT:    1. Coronary artery disease involving native coronary artery of native heart with angina pectoris ([place])   2. Mixed hyperlipidemia   3. Essential hypertension    PLAN:    In order of problems listed above:  1. Patient is stable on amlodipine and metoprolol.  Her blood pressure is very well controlled at this point.  She remains on aspirin and clopidogrel without bleeding complications at present.  She remains on a high intensity statin drug. 2. Treated with atorvastatin 40 mg daily.  Lipids from May show a total cholesterol of 127, HDL 31, LDL 81. 3. Blood pressure controlled on amlodipine, isosorbide, and metoprolol.  Overall the patient is stable.  Her murmur is likely flow murmur based on review of her echocardiogram which showed no significant valvular disease.  We discussed the benefits of the COVID-19 vaccine at preventing severe illness in patients with cardiac disease and with cancer.  She understands and will consider moving forward with this.  Shared Decision Making/Informed Consent      Medication Adjustments/Labs and Tests Ordered: Current medicines are reviewed at length with the patient today.  Concerns regarding medicines are outlined above.  No orders of the defined types were placed in this encounter.  Meds  ordered this encounter  Medications  . furosemide (LASIX) 40 MG tablet    Sig: Take 1 tablet (40 mg total) by mouth daily.    Dispense:  90 tablet    Refill:  3    DOSE CHANGE    Patient Instructions  Medication Instructions:  Your provider recommends that you continue on your current medications as directed. Please  refer to the Current Medication list given to you today.   *If you need a refill on your cardiac medications before your next appointment, please call your pharmacy*   Follow-Up: At [facility], you and your health needs are our priority.  As part of our continuing mission to provide you with exceptional heart care, we have created designated Provider Care Teams.  These Care Teams include your primary Cardiologist (physician) and Advanced Practice Providers (APPs -  Physician Assistants and Nurse Practitioners) who all work together to provide you with the care you need, when you need it. Your next appointment:   6 month(s) The format for your next appointment:   In Person Provider:   You may see Sherren Mocha, MD or one of the following Advanced Practice Providers on your designated Care Team:    Richardson Dopp, PA-C  Robbie Lis, [place]     For your leg edema you should do the following 1. Leg elevation - I recommend the Lounge Dr. Leg rest.  See below for details  2. Salt restriction  -  Use potassium chloride instead of regular salt as a salt substitute. 3. Walk regularly 4. Compression hose - [facility] 5. Weight loss    Available on [email or web address] Or  Go to [email or web address]         Signed, Sherren Mocha, MD  08/12/2020 4:57 PM    [place]

## 2020-08-12 NOTE — Patient Instructions (Addendum)
Medication Instructions:  Your provider recommends that you continue on your current medications as directed. Please refer to the Current Medication list given to you today.   *If you need a refill on your cardiac medications before your next appointment, please call your pharmacy*   Follow-Up: At [facility], you and your health needs are our priority.  As part of our continuing mission to provide you with exceptional heart care, we have created designated Provider Care Teams.  These Care Teams include your primary Cardiologist (physician) and Advanced Practice Providers (APPs -  Physician Assistants and Nurse Practitioners) who all work together to provide you with the care you need, when you need it. Your next appointment:   6 month(s) The format for your next appointment:   In Person Provider:   You may see Sherren Mocha, MD or one of the following Advanced Practice Providers on your designated Care Team:    Richardson Dopp, PA-C  Robbie Lis, [place]     For your leg edema you should do the following 1. Leg elevation - I recommend the Lounge Dr. Leg rest.  See below for details  2. Salt restriction  -  Use potassium chloride instead of regular salt as a salt substitute. 3. Walk regularly 4. Compression hose - [facility] 5. Weight loss    Available on [email or web address] Or  Go to [email or web address]

## 2020-08-17 ENCOUNTER — Other Ambulatory Visit: Payer: Self-pay | Admitting: Cardiovascular Disease

## 2020-09-15 ENCOUNTER — Other Ambulatory Visit: Payer: Self-pay

## 2020-09-15 ENCOUNTER — Telehealth (INDEPENDENT_AMBULATORY_CARE_PROVIDER_SITE_OTHER): Payer: Medicare Other | Admitting: Family Medicine

## 2020-09-15 DIAGNOSIS — J069 Acute upper respiratory infection, unspecified: Secondary | ICD-10-CM

## 2020-09-15 NOTE — Progress Notes (Addendum)
Subjective:    Patient ID: Sheri Hernandez, female    DOB: Aug 16, 1956, 65 y.o.   MRN: [id number]  HPI  Patient is a 65 year old who is being seen today as a telephone visit.  She consents to be seen via telephone.  Phone call began at 206.  Phone call concluded at 216.  She is in her car and I am in my office. Patient tested negative for COVID on January 4.  On January 5 she developed a cough.  She describes it as a rattling cough with congestion in her upper airways.  The cough is nonproductive.  She denies any shortness of breath.  She denies any fever.  She does have rhinorrhea.  It also hurts in her ribs when she coughs.  She denies any purulent sputum.  She denies any hemoptysis.  She denies any nausea or vomiting.  She has a history of COVID.  She has not had her COVID-vaccine. Past Medical History:  Diagnosis Date  . Acute blood loss anemia 01/12/2015  . Adenocarcinoma, renal cell ([place]) 11/12/2012   Overview:   09/25/12 - CXR - lung masses  CT CAP - rt hilar mass, mult pulm nodules, LLL mass 3 x 3.1 cm, RLL mass 2.2 x 2.0 cm, left kidney mass 12.4 x 11.1 x 10.3 cm  10/05/12 PET - left lung mass 30 mm SUV 16.7, rt lung nodule 21 mm, SUV 10.4, left kidney mass 7 cm SUV 18, mass compressing RLL bronchus  10/11/12 lung biopsy - atypical cells  10/23/12 left kidney biopsy - high gr clear cell RCC   . Anxiety   . Arthritis    "maybe in my right hand" (05/26/2017)  . CAD (coronary artery disease)    a. 12/2014 Cath: LM nl, LAD 20p, 86m, D2 20ost, LCX 95p, 90m, RCA dominant, 95p, 49m/d, RPDA 95, RPL2 50, attempted RCA PCI w/ acute RCA occlusion-->CABG x 2 VG->RPDA->PLVB.  Marland Kitchen Chronic diastolic CHF (congestive heart failure) ([place])   . Drug-induced hypothyroidism 03/13/2014  . GERD (gastroesophageal reflux disease) 03/18/2015  . Hyperglycemia 01/12/2015  . Hypertension   . Hypoalbuminemia   . Hypothyroidism   . Lower extremity edema   . Lung mass    LUNG MASSES--COUGH-BIOPSY NON-DIAGNOSTIC.  HX OF  MEDIASTINAL MASS AND LUNG MASSES AGE 89 -NEGATIVE BIOSPY-BUT GIVEN DIAGNOSIS OF SARCOIDOSIS.  WORK UP CONTINUING ON DIAGNOSIS FOR PT'S LUNG MASSES--SHE HAS BIOPSY PROVEN KIDNEY CANCER  . Mass of left breast 08/01/2011  . Myocardial infarction ([place]) 12/2014 X 2  . 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  . Papilloma of breast 2012   left. lumpectomy  . Pulmonary nodules   . Renal cell cancer [facility])    metastatic renal cell carcinoma (biopsy of left renal mass 10/2012) On nivolumab at [facility]  . Renal mass 10/01/2012   10/23/12 Bx Pos high grad clear cell renal cell ca> referred to oncology 10/26/2012    . S/p nephrectomy    left, due to metastatic RCC, 3/14   Past Surgical History:  Procedure Laterality Date  . BREAST BIOPSY  08/18/2011   Procedure: BREAST BIOPSY WITH NEEDLE LOCALIZATION;  Surgeon: Merrie Roof, MD;  Location: [facility];  Service: General;  Laterality: Left;  left breast biopsy with needle  localization  . BRONCHIAL BRUSH BIOPSY  2014  . CARDIAC CATHETERIZATION    . CORONARY ARTERY BYPASS GRAFT N/A 01/02/2015   Procedure: CORONARY ARTERY BYPASS GRAFTING (CABG)TIMES TWO USING RIGHT GREATER SAPHENOUS LEG VEIN HARVESTED ENDOSCOPICALLY;  Surgeon: [name], MD;  Location: [facility];  Service: Open Heart Surgery;  Laterality: N/A;  . LAPAROSCOPIC NEPHRECTOMY Left 11/22/2012   Procedure: LAPAROSCOPIC NEPHRECTOMY;  Surgeon: Dutch Gray, MD;  [facility];  Service: Urology;  Laterality: Left;  . LEFT HEART CATH AND CORS/GRAFTS ANGIOGRAPHY N/A 10/11/2016   Procedure: Left Heart Cath and Cors/Grafts Angiography;  Surgeon: Sherren Mocha, MD;  Location: [facility] CV LAB;  Service: Cardiovascular;  Laterality: N/A;  . LEFT HEART CATHETERIZATION WITH CORONARY ANGIOGRAM N/A 01/02/2015    Procedure: LEFT HEART CATHETERIZATION WITH CORONARY ANGIOGRAM;  Surgeon: Leonie Man, MD;  Location: [facility] CATH LAB;  Service: Cardiovascular;  Laterality: N/A;  . NECK LESION BIOPSY Left 08/2016  . PORTA CATH INSERTION Right ~ 2017  . TONSILLECTOMY  1967  . VIDEO BRONCHOSCOPY  10/11/2012   Procedure: VIDEO BRONCHOSCOPY WITH FLUORO;  Surgeon: Kathee Delton, MD;  [facility];  Service: Cardiopulmonary;  Laterality: Bilateral;  . WIDS    . WISDOM TOOTH EXTRACTION     "all 4"   Current Outpatient Medications on File Prior to Visit  Medication Sig Dispense Refill  . acetaminophen (TYLENOL) 500 MG tablet Take 500-1,000 mg by mouth every 6 (six) hours as needed for mild pain or headache.    Marland Kitchen amLODipine (NORVASC) 10 MG tablet Take 1 tablet (10 mg total) by mouth daily. 90 tablet 3  . aspirin 81 MG EC tablet Take 1 tablet (81 mg total) by mouth daily.    Marland Kitchen atorvastatin (LIPITOR) 40 MG tablet TAKE 1 TABLET (40 MG TOTAL) BY MOUTH DAILY. 90 tablet 3  . Cholecalciferol (VITAMIN D PO) Take 1 tablet by mouth daily.     . Cholecalciferol (VITAMIN D-3) 25 MCG (1000 UT) CAPS Take 1,000 Units by mouth daily with breakfast.    . clopidogrel (PLAVIX) 75 MG tablet TAKE 1 TABLET BY MOUTH EVERY DAY 90 tablet 3  . furosemide (LASIX) 40 MG tablet Take 1 tablet (40 mg total) by mouth daily. 90 tablet 3  . HYDROcodone-acetaminophen (NORCO/VICODIN) 5-325 MG tablet Take 1 tablet by mouth every 6 (six) hours as needed (for pain).     . isosorbide mononitrate (IMDUR) 120 MG 24 hr tablet Take 1 tablet (120 mg total) by mouth daily. 90 tablet 3  . levothyroxine (SYNTHROID) 150 MCG tablet Take 1 tablet (150 mcg total) by mouth daily before breakfast. 30 tablet 5  . lidocaine-prilocaine (EMLA) cream Apply 1 application topically See admin instructions. APPLY CREAM TO PORTACATH SITE APPROXIMATELY 1 HOUR BEFORE ACCESS AND COVER    . Menthol (RICOLA) LOZG Use as directed 1 lozenge in the mouth or throat as needed (for  a sore throat).    . metoprolol tartrate (LOPRESSOR) 100 MG tablet TAKE 1 TABLET BY MOUTH 2 TIMES DAILY. MAKE YEARLY APPT WITH DR. Burt Knack FOR JUNE FOR FUTURE REFILLS. 180 tablet 2  . nitroGLYCERIN (NITROSTAT) 0.4 MG SL tablet PLACE 1 TABLET UNDER THE TONGUE EVERY 5 MINUTES AS NEEDED FOR CHEST PAIN 25 tablet 6  . ondansetron (ZOFRAN ODT) 4 MG disintegrating tablet Take 1 tablet (4 mg total) by mouth every 8 (eight) hours as needed for nausea or vomiting. 20 tablet 0  . oxycodone (OXY-IR) 5 MG capsule Take 5 mg by  mouth daily as needed (severe pain). pain     No current facility-administered medications on file prior to visit.   Allergies  Allergen Reactions  . Silver Dermatitis and Other (See Comments)    Please use Mepilex bandaging ("brown/tan, not silver")  . Tape Other (See Comments)    PLEASE DO NOT USE "PLASTIC" TAPE!! IT "RIPS OFF MY SKIN"   Social History   Socioeconomic History  . Marital status: Married    Spouse name: Not on file  . Number of children: 0  . Years of education: Not on file  . Highest education level: Not on file  Occupational History  . 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   Tobacco Use  . Smoking status: Former Smoker    Packs/day: 1.50    Years: 40.00    Pack years: 60.00    Types: Cigarettes    Quit date: 09/24/2012    Years since quitting: 7.9  . Smokeless tobacco: Never Used  Vaping Use  . Vaping Use: Never used  Substance and Sexual Activity  . Alcohol use: Yes    Comment: 05/26/2017 "couple drinks/year"  . Drug use: No  . Sexual activity: Yes  Other Topics Concern  . Not on file  Social History Narrative  . Not on file   Social Determinants of Health   Financial Resource Strain: Not on file  Food Insecurity: Not on file  Transportation Needs: Not on file  Physical Activity: Not on file  Stress: Not on file  Social Connections: Not on file  Intimate Partner Violence: Not on file     Review of Systems   All other systems reviewed and are negative.      Objective:   Physical Exam        Assessment & Plan:  Viral upper respiratory tract infection - Plan: SARS-COV-2 RNA,(COVID-19) QUAL NAAT  Symptoms sound consistent for a viral upper respiratory tract infection.  Recommended supportive care with Mucinex for cough and chest congestion and Coricidin for head congestion.  Also recommended the patient come by the office so that we can swab her for COVID-19 as she would be a high risk patient given her age and history of renal cell cancer with mets to the lung.

## 2020-09-18 LAB — SARS-COV-2 RNA,(COVID-19) QUALITATIVE NAAT: SARS CoV2 RNA: NOT DETECTED

## 2020-09-21 ENCOUNTER — Ambulatory Visit: Payer: Medicare Other | Admitting: Family Medicine

## 2020-09-22 ENCOUNTER — Telehealth: Payer: Self-pay | Admitting: Cardiovascular Disease

## 2020-09-22 NOTE — Telephone Encounter (Signed)
Patient calling to speak with [facility]. She did not want to give any further information. She states to call her husband's number: [phone]

## 2020-09-23 ENCOUNTER — Telehealth: Payer: Self-pay

## 2020-09-23 ENCOUNTER — Other Ambulatory Visit: Payer: Self-pay | Admitting: Family Medicine

## 2020-09-23 NOTE — Telephone Encounter (Signed)
Sheri Hernandez called and would her recent after summary visit corrected. She stated she has kidney cancer and not lung cancer. Please advise.  She's also having a over active bladder. She's asking would you like to see her for this? Will set her up with an [place] appointment, if ok? Thursday 09/24/20  @ 2:15

## 2020-09-23 NOTE — Telephone Encounter (Signed)
Left message to call back  

## 2020-09-23 NOTE — Telephone Encounter (Signed)
The patient reports she has been very sick with an upper respiratory infection. She has an appointment with Dr. Dennard Schaumann tomorrow for follow-up after trying OTC meds for several days. She has a bad cough and thinks she has bronchitis. She also is experiencing overactive bladder.  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 She has had a couple bouts of CP over the last few days and the monitor said she had PVCs during those episodes. Encouraged her to show strips to Dr. Dennard Schaumann tomorrow and reiterated to her that Dr. Dennard Schaumann will call Cardiology if needed.   She also requests a Madaline Savage duty excuse letter from Dr. Burt Knack. She is scheduled 2/7 and requests a letter by early next week. While she was encouraged to ask Dr. Dennard Schaumann about Madaline Savage duty excuse since she will see him tomorrow, she is adamant about Dr. Burt Knack writing one as well.  She understands she will be called after speaking with Dr. Burt Knack.

## 2020-09-23 NOTE — Telephone Encounter (Signed)
Patient's husband is returning call. 

## 2020-09-24 ENCOUNTER — Encounter: Payer: Self-pay | Admitting: Family Medicine

## 2020-09-24 ENCOUNTER — Ambulatory Visit (INDEPENDENT_AMBULATORY_CARE_PROVIDER_SITE_OTHER): Payer: Medicare Other | Admitting: Family Medicine

## 2020-09-24 ENCOUNTER — Other Ambulatory Visit: Payer: Self-pay

## 2020-09-24 VITALS — BP 142/96 | HR 78 | Temp 97.4°F | Wt 223.0 lb

## 2020-09-24 DIAGNOSIS — R0781 Pleurodynia: Secondary | ICD-10-CM | POA: Diagnosis not present

## 2020-09-24 DIAGNOSIS — R3589 Other polyuria: Secondary | ICD-10-CM

## 2020-09-24 DIAGNOSIS — R0789 Other chest pain: Secondary | ICD-10-CM

## 2020-09-24 LAB — URINALYSIS, ROUTINE W REFLEX MICROSCOPIC
Bilirubin Urine: NEGATIVE
Glucose, UA: NEGATIVE
Hyaline Cast: NONE SEEN /LPF
Ketones, ur: NEGATIVE
Nitrite: NEGATIVE
Specific Gravity, Urine: 1.02 (ref 1.001–1.03)
pH: 5.5 (ref 5.0–8.0)

## 2020-09-24 LAB — MICROSCOPIC MESSAGE

## 2020-09-24 MED ORDER — SULFAMETHOXAZOLE-TRIMETHOPRIM 800-160 MG PO TABS: 1.0000 | ORAL_TABLET | Freq: Two times a day (BID) | ORAL | 0 refills | Status: DC

## 2020-09-24 MED ORDER — HYDROCODONE-ACETAMINOPHEN 5-325 MG PO TABS: 1.0000 | ORAL_TABLET | Freq: Four times a day (QID) | ORAL | 0 refills | Status: DC | PRN

## 2020-09-24 NOTE — Progress Notes (Signed)
Subjective:    Patient ID: Sheri Hernandez, female    DOB: Jul 29, 1956, 65 y.o.   MRN: [id number]  HPI  Patient presents today with severe pain in her right flank and her right mid axillary line.  This occurred suddenly this morning without provocation.  The pain is made worse by movement.  It is made worse by breathing.  Is made worse by palpation.  It is made worse by coughing.  She is moaning throughout our entire encounter.  She is screaming as she walks down the hall.  Gentle palpation of the ribs elicits severe pain in the right mid axillary line.  She denies any hemoptysis or shortness of breath although she does have pleurisy.  She denies any fevers or chills.  She does report increased urinary frequency starting after her recent upper respiratory infection.  Urinalysis today shows trace blood, +2 protein, negative nitrates, and +1 leukocyte esterase.  She denies dysuria but she does report hesitancy and urgency. Past Medical History:  Diagnosis Date  . Acute blood loss anemia 01/12/2015  . Adenocarcinoma, renal cell ([place]) 11/12/2012   Overview:   09/25/12 - CXR - lung masses  CT CAP - rt hilar mass, mult pulm nodules, LLL mass 3 x 3.1 cm, RLL mass 2.2 x 2.0 cm, left kidney mass 12.4 x 11.1 x 10.3 cm  10/05/12 PET - left lung mass 30 mm SUV 16.7, rt lung nodule 21 mm, SUV 10.4, left kidney mass 7 cm SUV 18, mass compressing RLL bronchus  10/11/12 lung biopsy - atypical cells  10/23/12 left kidney biopsy - high gr clear cell RCC   . Anxiety   . Arthritis    "maybe in my right hand" (05/26/2017)  . CAD (coronary artery disease)    a. 12/2014 Cath: LM nl, LAD 20p, 17m, D2 20ost, LCX 95p, 43m, RCA dominant, 95p, 38m/d, RPDA 95, RPL2 50, attempted RCA PCI w/ acute RCA occlusion-->CABG x 2 VG->RPDA->PLVB.  Marland Kitchen Chronic diastolic CHF (congestive heart failure) ([place])   . Drug-induced hypothyroidism 03/13/2014  . GERD (gastroesophageal reflux disease) 03/18/2015  . Hyperglycemia 01/12/2015  . Hypertension    . Hypoalbuminemia   . Hypothyroidism   . Lower extremity edema   . Lung mass    LUNG MASSES--COUGH-BIOPSY NON-DIAGNOSTIC.  HX OF MEDIASTINAL MASS AND LUNG MASSES AGE 35 -NEGATIVE BIOSPY-BUT GIVEN DIAGNOSIS OF SARCOIDOSIS.  WORK UP CONTINUING ON DIAGNOSIS FOR PT'S LUNG MASSES--SHE HAS BIOPSY PROVEN KIDNEY CANCER  . Mass of left breast 08/01/2011  . Myocardial infarction ([place]) 12/2014 X 2  . 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  . Papilloma of breast 2012   left. lumpectomy  . Pulmonary nodules   . Renal cell cancer [facility])    metastatic renal cell carcinoma (biopsy of left renal mass 10/2012) On nivolumab at [facility]  . Renal mass 10/01/2012   10/23/12 Bx Pos high grad clear cell renal cell ca> referred to oncology 10/26/2012    . S/p nephrectomy    left, due to metastatic RCC, 3/14   Past Surgical History:  Procedure Laterality Date  . BREAST BIOPSY  08/18/2011   Procedure: BREAST BIOPSY WITH NEEDLE LOCALIZATION;  Surgeon: Merrie Roof, MD;  Location: [facility];  Service: General;  Laterality: Left;  left breast biopsy with needle localization  . BRONCHIAL BRUSH BIOPSY  2014  . CARDIAC CATHETERIZATION    . CORONARY ARTERY BYPASS GRAFT N/A 01/02/2015   Procedure: CORONARY ARTERY BYPASS GRAFTING (CABG)TIMES TWO USING RIGHT GREATER SAPHENOUS LEG VEIN HARVESTED ENDOSCOPICALLY;  Surgeon: [name], MD;  Location: [facility];  Service: Open Heart Surgery;  Laterality: N/A;  . LAPAROSCOPIC NEPHRECTOMY Left 11/22/2012   Procedure: LAPAROSCOPIC NEPHRECTOMY;  Surgeon: Dutch Gray, MD;  [facility];  Service: Urology;  Laterality: Left;  . LEFT HEART CATH AND CORS/GRAFTS ANGIOGRAPHY N/A 10/11/2016   Procedure: Left Heart Cath and Cors/Grafts Angiography;  Surgeon: Sherren Mocha, MD;  Location: [facility] CV LAB;  Service: Cardiovascular;  Laterality: N/A;  . LEFT HEART CATHETERIZATION WITH CORONARY ANGIOGRAM N/A 01/02/2015   Procedure: LEFT HEART CATHETERIZATION WITH CORONARY ANGIOGRAM;  Surgeon: Leonie Man, MD;  Location: [facility] CATH LAB;  Service: Cardiovascular;  Laterality: N/A;  . NECK LESION BIOPSY Left 08/2016  . PORTA CATH INSERTION Right ~ 2017  . TONSILLECTOMY  1967  . VIDEO BRONCHOSCOPY  10/11/2012   Procedure: VIDEO BRONCHOSCOPY WITH FLUORO;  Surgeon: Kathee Delton, MD;  [facility];  Service: Cardiopulmonary;  Laterality: Bilateral;  . WIDS    . WISDOM TOOTH EXTRACTION     "all 4"   Current Outpatient Medications on File Prior to Visit  Medication Sig Dispense Refill  . acetaminophen (TYLENOL) 500 MG tablet Take 500-1,000 mg by mouth every 6 (six) hours as needed for mild pain or headache.    Marland Kitchen amLODipine (NORVASC) 10 MG tablet Take 1 tablet (10 mg total) by mouth daily. 90 tablet 3  . aspirin 81 MG EC tablet Take 1 tablet (81 mg total) by mouth daily.    Marland Kitchen atorvastatin (LIPITOR) 40 MG tablet TAKE 1 TABLET (40 MG TOTAL) BY MOUTH DAILY. 90 tablet 3  . Cholecalciferol (VITAMIN D PO) Take 1 tablet by mouth daily.     . clopidogrel (PLAVIX) 75 MG tablet TAKE 1 TABLET BY MOUTH EVERY DAY 90 tablet 3  . HYDROcodone-acetaminophen (NORCO/VICODIN) 5-325 MG tablet Take 1 tablet by mouth every 6 (six) hours as needed (for pain).     . isosorbide mononitrate (IMDUR) 120 MG 24 hr tablet Take 1 tablet (120 mg total) by mouth daily. 90 tablet 3  . levothyroxine (SYNTHROID) 150 MCG tablet TAKE 1 TABLET BY MOUTH DAILY BEFORE BREAKFAST. 30 tablet 4  . lidocaine-prilocaine (EMLA) cream Apply 1 application topically See admin instructions. APPLY CREAM TO PORTACATH SITE APPROXIMATELY 1 HOUR BEFORE ACCESS AND COVER    . Menthol (RICOLA) LOZG Use as directed 1 lozenge in the mouth or throat as needed (for a sore throat).    . metoprolol tartrate (LOPRESSOR) 100 MG tablet TAKE 1  TABLET BY MOUTH 2 TIMES DAILY. MAKE YEARLY APPT WITH DR. Burt Knack FOR JUNE FOR FUTURE REFILLS. 180 tablet 2  . nitroGLYCERIN (NITROSTAT) 0.4 MG SL tablet PLACE 1 TABLET UNDER THE TONGUE EVERY 5 MINUTES AS NEEDED FOR CHEST PAIN 25 tablet 6  . Cholecalciferol (VITAMIN D-3) 25 MCG (1000 UT) CAPS Take 1,000 Units by mouth daily with breakfast. (Patient not taking: Reported on 09/24/2020)    . furosemide (LASIX) 40 MG tablet Take 1 tablet (40 mg total) by mouth daily. (Patient not taking: Reported on 09/24/2020) 90 tablet 3  . ondansetron (ZOFRAN ODT) 4 MG disintegrating tablet Take 1 tablet (4 mg total) by mouth every 8 (eight) hours as needed for nausea or vomiting. (  Patient not taking: Reported on 09/24/2020) 20 tablet 0  . oxycodone (OXY-IR) 5 MG capsule Take 5 mg by mouth daily as needed (severe pain). pain (Patient not taking: Reported on 09/24/2020)     No current facility-administered medications on file prior to visit.   Allergies  Allergen Reactions  . Silver Dermatitis and Other (See Comments)    Please use Mepilex bandaging ("brown/tan, not silver")  . Tape Other (See Comments)    PLEASE DO NOT USE "PLASTIC" TAPE!! IT "RIPS OFF MY SKIN"   Social History   Socioeconomic History  . Marital status: Married    Spouse name: Not on file  . Number of children: 0  . Years of education: Not on file  . Highest education level: Not on file  Occupational History  . 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   Tobacco Use  . Smoking status: Former Smoker    Packs/day: 1.50    Years: 40.00    Pack years: 60.00    Types: Cigarettes    Quit date: 09/24/2012    Years since quitting: 8.0  . Smokeless tobacco: Never Used  Vaping Use  . Vaping Use: Never used  Substance and Sexual Activity  . Alcohol use: Yes    Comment: 05/26/2017 "couple drinks/year"  . Drug use: No  . Sexual activity: Yes  Other Topics Concern  . Not on file  Social History Narrative  . Not on file    Social Determinants of Health   Financial Resource Strain: Not on file  Food Insecurity: Not on file  Transportation Needs: Not on file  Physical Activity: Not on file  Stress: Not on file  Social Connections: Not on file  Intimate Partner Violence: Not on file     Review of Systems  All other systems reviewed and are negative.      Objective:   Physical Exam Vitals reviewed.  Constitutional:      General: She is in acute distress.     Appearance: She is not ill-appearing or toxic-appearing.  Cardiovascular:     Rate and Rhythm: Normal rate and regular rhythm.     Heart sounds: Normal heart sounds.  Pulmonary:     Effort: Pulmonary effort is normal. No respiratory distress.     Breath sounds: Normal breath sounds. No decreased air movement. No wheezing, rhonchi or rales.    Chest:     Chest wall: Tenderness present.    Neurological:     Mental Status: She is alert.           Assessment & Plan:  Polyuria - Plan: Urinalysis, Routine w reflex microscopic  Rib pain - Plan: DG Ribs Unilateral Right  Patient seems to be in extreme pain.  Suspect a rib fracture possibly due to coughing.  Obtain x-rays of the ribs to evaluate further.  Treat symptomatically with Norco 5/325 1 p.o. every 6 hours as needed pain.  Urinalysis suggest a urinary tract infection which I will treat with Bactrim double strength tablets 1 twice a day for 5 days.

## 2020-09-24 NOTE — Telephone Encounter (Signed)
Patient seen in office and discussed with PCP.

## 2020-09-24 NOTE — Telephone Encounter (Signed)
Her pmh is renal cell carcinoma with metastasis to the lung.  That is what is listed in her chart.

## 2020-09-25 ENCOUNTER — Other Ambulatory Visit: Payer: Self-pay | Admitting: Family Medicine

## 2020-09-25 ENCOUNTER — Ambulatory Visit
Admission: RE | Admit: 2020-09-25 | Discharge: 2020-09-25 | Disposition: A | Discharge status: Home / Self Care | Payer: Medicare Other | Source: Ambulatory Visit | Attending: Family Medicine | Admitting: Family Medicine

## 2020-09-25 DIAGNOSIS — R0781 Pleurodynia: Secondary | ICD-10-CM

## 2020-09-25 MED ORDER — LEVOFLOXACIN 500 MG PO TABS: 500.0000 mg | ORAL_TABLET | Freq: Every day | ORAL | 0 refills | Status: DC

## 2020-09-28 ENCOUNTER — Encounter: Payer: Self-pay | Admitting: Cardiovascular Disease

## 2020-09-28 NOTE — Telephone Encounter (Signed)
Letter written, printed and signed.  The patient's husband will come pick up the letter later today.  Letter placed at check-in for husband to pick up as the patient is still having respiratory symptoms.  The patient was grateful for assistance.

## 2020-09-29 ENCOUNTER — Encounter: Payer: Self-pay | Admitting: Family Medicine

## 2020-10-12 ENCOUNTER — Ambulatory Visit: Payer: Medicare Other | Admitting: Family Medicine

## 2020-10-13 ENCOUNTER — Other Ambulatory Visit: Payer: Self-pay

## 2020-10-13 ENCOUNTER — Ambulatory Visit (INDEPENDENT_AMBULATORY_CARE_PROVIDER_SITE_OTHER): Payer: Medicare Other | Admitting: Family Medicine

## 2020-10-13 ENCOUNTER — Telehealth: Payer: Self-pay | Admitting: *Deleted

## 2020-10-13 VITALS — BP 132/88 | HR 75 | Temp 98.3°F | Resp 16 | Ht 68.0 in | Wt 227.0 lb

## 2020-10-13 DIAGNOSIS — J189 Pneumonia, unspecified organism: Secondary | ICD-10-CM

## 2020-10-13 DIAGNOSIS — E118 Type 2 diabetes mellitus with unspecified complications: Secondary | ICD-10-CM

## 2020-10-13 DIAGNOSIS — R0781 Pleurodynia: Secondary | ICD-10-CM

## 2020-10-13 MED ORDER — LEVOFLOXACIN 500 MG PO TABS: 500.0000 mg | ORAL_TABLET | Freq: Every day | ORAL | 0 refills | Status: DC

## 2020-10-13 MED ORDER — HYDROCODONE-HOMATROPINE 5-1.5 MG/5ML PO SYRP: 5.0000 mL | ORAL_SOLUTION | Freq: Three times a day (TID) | ORAL | 0 refills | Status: DC | PRN

## 2020-10-13 MED ORDER — GUAIFENESIN-CODEINE 100-10 MG/5ML PO SOLN: 5.0000 mL | Freq: Three times a day (TID) | ORAL | 0 refills | Status: DC | PRN

## 2020-10-13 NOTE — Addendum Note (Signed)
Addended by: Jenna Luo T on: 10/13/2020 05:11 PM   Modules accepted: Orders

## 2020-10-13 NOTE — Progress Notes (Signed)
Subjective:    Patient ID: Sheri Hernandez, female    DOB: [birth year], 65 y.o.   MRN: [id number]  HPI 09/24/20 Patient presents today with severe pain in her right flank and her right mid axillary line.  This occurred suddenly this morning without provocation.  The pain is made worse by movement.  It is made worse by breathing.  Is made worse by palpation.  It is made worse by coughing.  She is moaning throughout our entire encounter.  She is screaming as she walks down the hall.  Gentle palpation of the ribs elicits severe pain in the right mid axillary line.  She denies any hemoptysis or shortness of breath although she does have pleurisy.  She denies any fevers or chills.  She does report increased urinary frequency starting after her recent upper respiratory infection.  Urinalysis today shows trace blood, +2 protein, negative nitrates, and +1 leukocyte esterase.  She denies dysuria but she does report hesitancy and urgency.  At that time, my plan was: Patient seems to be in extreme pain.  Suspect a rib fracture possibly due to coughing.  Obtain x-rays of the ribs to evaluate further.  Treat symptomatically with Norco 5/325 1 p.o. every 6 hours as needed pain.  Urinalysis suggest a urinary tract infection which I will treat with Bactrim double strength tablets 1 twice a day for 5 days.  Xrays showed: IMPRESSION: 1. Right middle lobe consolidation, which could reflect pneumonia. Followup PA and lateral chest X-ray is recommended in 3-4 weeks following trial of antibiotic therapy to ensure resolution and exclude underlying malignancy. 2. No acute or destructive bony lesions.  Therefore I started her on levaquin 500 mg poqday x 7 days and stopped bactrim.    10/13/20 Patient had CT scan at [facility] on February 1 as part of her follow-up for her metastatic renal cell carcinoma.  I have copied the relevant portions of the CT of the chest:  Lungs/Pleura: Biapical pleural-parenchymal scarring. Few  scattered sub-4 mm nodules for example left upper lobe appear stable. No new or enlarging lung nodules. Moderate centrilobular emphysema. Faint new groundglass opacities in the anterior right upper lobe. Bibasilar atelectasis/scarring  Patient states that the pain is better.  She is no longer having the pleurisy on the right side.  She is no longer tender to palpation in the area diagrammed below with the purple dot.  Therefore the pain is resolved.  On the recent CAT scan, it appeared that the pneumonia was resolving.  She states that she still having coughing however the coughing is improving.  She questions if she needs additional antibiotics.  She denies any fever.  She denies any shortness of breath.  She denies any pleurisy.  There was already radiographic improvement on a CAT scan after only 1 week of therapy.  Therefore I believe the pneumonia is resolving and the further antibiotics are not indicated try to explain that to the patient today.  She is long overdue for diabetes lab work.  Her last A1c was 7.4 in February.  Her oncologist recommended following up with me to manage her diabetes which I would be happy to do however the patient adamantly refuses to allow me to draw any blood work today.  I try to explain to the patient that I am unable to manage her diabetes without checking her cholesterol or A1c her urine microalbumin.  However, patient is declining to allow me to do this and I cannot convince her otherwise.  Past Medical History:  Diagnosis Date  . Acute blood loss anemia 01/12/2015  . Adenocarcinoma, renal cell ([place]) 11/12/2012   Overview:   09/25/12 - CXR - lung masses  CT CAP - rt hilar mass, mult pulm nodules, LLL mass 3 x 3.1 cm, RLL mass 2.2 x 2.0 cm, left kidney mass 12.4 x 11.1 x 10.3 cm  10/05/12 PET - left lung mass 30 mm SUV 16.7, rt lung nodule 21 mm, SUV 10.4, left kidney mass 7 cm SUV 18, mass compressing RLL bronchus  10/11/12 lung biopsy - atypical cells  10/23/12 left  kidney biopsy - high gr clear cell RCC   . Anxiety   . Arthritis    "maybe in my right hand" (05/26/2017)  . CAD (coronary artery disease)    a. 12/2014 Cath: LM nl, LAD 20p, 33m, D2 20ost, LCX 95p, 60m, RCA dominant, 95p, 78m/d, RPDA 95, RPL2 50, attempted RCA PCI w/ acute RCA occlusion-->CABG x 2 VG->RPDA->PLVB.  Marland Kitchen Chronic diastolic CHF (congestive heart failure) ([place])   . Drug-induced hypothyroidism 03/13/2014  . GERD (gastroesophageal reflux disease) 03/18/2015  . Hyperglycemia 01/12/2015  . Hypertension   . Hypoalbuminemia   . Hypothyroidism   . Lower extremity edema   . Lung mass    LUNG MASSES--COUGH-BIOPSY NON-DIAGNOSTIC.  HX OF MEDIASTINAL MASS AND LUNG MASSES AGE 89 -NEGATIVE BIOSPY-BUT GIVEN DIAGNOSIS OF SARCOIDOSIS.  WORK UP CONTINUING ON DIAGNOSIS FOR PT'S LUNG MASSES--SHE HAS BIOPSY PROVEN KIDNEY CANCER  . Mass of left breast 08/01/2011  . Myocardial infarction ([place]) 12/2014 X 2  . 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  . Papilloma of breast 2012   left. lumpectomy  . Pulmonary nodules   . Renal cell cancer [facility])    metastatic renal cell carcinoma (biopsy of left renal mass 10/2012) On nivolumab at [facility]  . Renal mass 10/01/2012   10/23/12 Bx Pos high grad clear cell renal cell ca> referred to oncology 10/26/2012    . S/p nephrectomy    left, due to metastatic RCC, 3/14   Past Surgical History:  Procedure Laterality Date  . BREAST BIOPSY  08/18/2011   Procedure: BREAST BIOPSY WITH NEEDLE LOCALIZATION;  Surgeon: Merrie Roof, MD;  Location: [place];  Service: General;  Laterality: Left;  left breast biopsy with needle localization  . BRONCHIAL BRUSH BIOPSY  2014  . CARDIAC CATHETERIZATION    . CORONARY ARTERY BYPASS GRAFT N/A 01/02/2015   Procedure: CORONARY ARTERY BYPASS  GRAFTING (CABG)TIMES TWO USING RIGHT GREATER SAPHENOUS LEG VEIN HARVESTED ENDOSCOPICALLY;  Surgeon: [name], MD;  Location: [place];  Service: Open Heart Surgery;  Laterality: N/A;  . LAPAROSCOPIC NEPHRECTOMY Left 11/22/2012   Procedure: LAPAROSCOPIC NEPHRECTOMY;  Surgeon: Dutch Gray, MD;  [facility];  Service: Urology;  Laterality: Left;  . LEFT HEART CATH AND CORS/GRAFTS ANGIOGRAPHY N/A 10/11/2016   Procedure: Left Heart Cath and Cors/Grafts Angiography;  Surgeon: Sherren Mocha, MD;  Location: [place] CV LAB;  Service: Cardiovascular;  Laterality: N/A;  . LEFT HEART CATHETERIZATION WITH CORONARY ANGIOGRAM N/A 01/02/2015   Procedure: LEFT HEART CATHETERIZATION WITH CORONARY ANGIOGRAM;  Surgeon: Leonie Man, MD;  Location: [facility] CATH LAB;  Service: Cardiovascular;  Laterality: N/A;  . NECK LESION BIOPSY Left 08/2016  .  PORTA CATH INSERTION Right ~ 2017  . TONSILLECTOMY  1967  . VIDEO BRONCHOSCOPY  10/11/2012   Procedure: VIDEO BRONCHOSCOPY WITH FLUORO;  Surgeon: Kathee Delton, MD;  [facility];  Service: Cardiopulmonary;  Laterality: Bilateral;  . WIDS    . WISDOM TOOTH EXTRACTION     "all 4"   Current Outpatient Medications on File Prior to Visit  Medication Sig Dispense Refill  . acetaminophen (TYLENOL) 500 MG tablet Take 500-1,000 mg by mouth every 6 (six) hours as needed for mild pain or headache.    Marland Kitchen amLODipine (NORVASC) 10 MG tablet Take 1 tablet (10 mg total) by mouth daily. 90 tablet 3  . aspirin 81 MG EC tablet Take 1 tablet (81 mg total) by mouth daily.    Marland Kitchen atorvastatin (LIPITOR) 40 MG tablet TAKE 1 TABLET (40 MG TOTAL) BY MOUTH DAILY. 90 tablet 3  . Cholecalciferol (VITAMIN D PO) Take 1 tablet by mouth daily.     . Cholecalciferol (VITAMIN D-3) 25 MCG (1000 UT) CAPS Take 1,000 Units by mouth daily with breakfast. (Patient not taking: Reported on 09/24/2020)    . clopidogrel (PLAVIX) 75 MG tablet TAKE 1 TABLET BY MOUTH EVERY DAY 90 tablet 3  . furosemide  (LASIX) 40 MG tablet Take 1 tablet (40 mg total) by mouth daily. (Patient not taking: Reported on 09/24/2020) 90 tablet 3  . HYDROcodone-acetaminophen (NORCO) 5-325 MG tablet Take 1 tablet by mouth every 6 (six) hours as needed for moderate pain. 30 tablet 0  . HYDROcodone-acetaminophen (NORCO/VICODIN) 5-325 MG tablet Take 1 tablet by mouth every 6 (six) hours as needed (for pain).     . isosorbide mononitrate (IMDUR) 120 MG 24 hr tablet Take 1 tablet (120 mg total) by mouth daily. 90 tablet 3  . levofloxacin (LEVAQUIN) 500 MG tablet Take 1 tablet (500 mg total) by mouth daily. 7 tablet 0  . levothyroxine (SYNTHROID) 150 MCG tablet TAKE 1 TABLET BY MOUTH DAILY BEFORE BREAKFAST. 30 tablet 4  . lidocaine-prilocaine (EMLA) cream Apply 1 application topically See admin instructions. APPLY CREAM TO PORTACATH SITE APPROXIMATELY 1 HOUR BEFORE ACCESS AND COVER    . Menthol (RICOLA) LOZG Use as directed 1 lozenge in the mouth or throat as needed (for a sore throat).    . metoprolol tartrate (LOPRESSOR) 100 MG tablet TAKE 1 TABLET BY MOUTH 2 TIMES DAILY. MAKE YEARLY APPT WITH DR. Burt Knack FOR JUNE FOR FUTURE REFILLS. 180 tablet 2  . nitroGLYCERIN (NITROSTAT) 0.4 MG SL tablet PLACE 1 TABLET UNDER THE TONGUE EVERY 5 MINUTES AS NEEDED FOR CHEST PAIN 25 tablet 6  . ondansetron (ZOFRAN ODT) 4 MG disintegrating tablet Take 1 tablet (4 mg total) by mouth every 8 (eight) hours as needed for nausea or vomiting. (Patient not taking: Reported on 09/24/2020) 20 tablet 0  . oxycodone (OXY-IR) 5 MG capsule Take 5 mg by mouth daily as needed (severe pain). pain (Patient not taking: Reported on 09/24/2020)     No current facility-administered medications on file prior to visit.   Allergies  Allergen Reactions  . Silver Dermatitis and Other (See Comments)    Please use Mepilex bandaging ("brown/tan, not silver")  . Tape Other (See Comments)    PLEASE DO NOT USE "PLASTIC" TAPE!! IT "RIPS OFF MY SKIN"   Social History    Socioeconomic History  . Marital status: Married    Spouse name: Not on file  . Number of children: 0  . Years of education: Not on file  .  Highest education level: Not on file  Occupational History  . 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   Tobacco Use  . Smoking status: Former Smoker    Packs/day: 1.50    Years: 40.00    Pack years: 60.00    Types: Cigarettes    Quit date: 09/24/2012    Years since quitting: 8.0  . Smokeless tobacco: Never Used  Vaping Use  . Vaping Use: Never used  Substance and Sexual Activity  . Alcohol use: Yes    Comment: 05/26/2017 "couple drinks/year"  . Drug use: No  . Sexual activity: Yes  Other Topics Concern  . Not on file  Social History Narrative  . Not on file   Social Determinants of Health   Financial Resource Strain: Not on file  Food Insecurity: Not on file  Transportation Needs: Not on file  Physical Activity: Not on file  Stress: Not on file  Social Connections: Not on file  Intimate Partner Violence: Not on file     Review of Systems  All other systems reviewed and are negative.      Objective:   Physical Exam Vitals reviewed.  Constitutional:      General: She is not in acute distress.    Appearance: She is not ill-appearing or toxic-appearing.  Cardiovascular:     Rate and Rhythm: Normal rate and regular rhythm.     Pulses: Normal pulses.     Heart sounds: Normal heart sounds. No murmur heard.   Pulmonary:     Effort: Pulmonary effort is normal. No respiratory distress.     Breath sounds: Normal breath sounds. No decreased air movement. No wheezing, rhonchi or rales.    Chest:     Chest wall: No tenderness.    Musculoskeletal:     Right lower leg: Edema present.     Left lower leg: Edema present.  Neurological:     Mental Status: She is alert.           Assessment & Plan:  Controlled type 2 diabetes mellitus with complication, without long-term current use of insulin  (HCC) - Plan: Hemoglobin A1c, CBC with Differential/Platelet, COMPLETE METABOLIC PANEL WITH GFR, Lipid panel, Microalbumin, urine  Rib pain  Community acquired pneumonia of right upper lobe of lung  Clinically the patient's pneumonia appears to be resolving.  The rib pain is totally subsided.  Therefore I do not feel that further antibiotic is necessary.  I recommended symptomatic treatment with Hycodan 1 teaspoon every 6 hours as needed for cough.  To assuage the patient's fears I did give her a prescription for Levaquin but I explained that it can cause tendinitis and muscle issues and therefore I recommended not taking it unless she developed fevers or chills or worsening cough or worsening chest pain none of which she has today.  I tried to convince the patient to allow me to check an A1c and also a fasting lipid panel and to check a urine microalbumin to help manage her diabetes.  Her A1c is long overdue.  Patient refuses.  This is frustrating for me as I am unable to be her doctor if she will not allow me to check basic lab work.  However this is her prerogative and I explained to her that I cannot manage her diabetes without checking lab work.  She states that she will come up with another plan.  Lastly she has significant edema in both legs distal to the knees on  exam today.  It is +2 and consistent with lymphedema.  She is not wearing any compression hose.  She is not taking her Lasix.  I recommended that she resume Lasix and start to wear compression hose to help manage the lymphedema.

## 2020-10-13 NOTE — Telephone Encounter (Signed)
Received call from patient.   States that [facility] has not received prescription for ABTx. Per notes, ABTx is not indicated, but Levaquin was to be prescribed to assuage patient fears.   Also reports that Hycodan is not available.   Call placed to pharmacy. Reports that Hycodan is on backorder and they have not had >6 months.   Reports that alternatives include: Robitussin AC Promethazine DM Bromed (Brompheniramine, dextromethorphan, and pseudoephedrine)  Please advise.

## 2020-10-13 NOTE — Telephone Encounter (Signed)
Sent levaquin and robitussin ac

## 2020-10-26 ENCOUNTER — Other Ambulatory Visit: Payer: Self-pay | Admitting: Family Medicine

## 2020-10-26 DIAGNOSIS — Z1231 Encounter for screening mammogram for malignant neoplasm of breast: Secondary | ICD-10-CM

## 2020-11-16 LAB — HM DIABETES EYE EXAM

## 2020-12-15 ENCOUNTER — Encounter: Payer: Self-pay | Admitting: Family Medicine

## 2020-12-15 ENCOUNTER — Other Ambulatory Visit: Payer: Self-pay

## 2020-12-15 ENCOUNTER — Ambulatory Visit (INDEPENDENT_AMBULATORY_CARE_PROVIDER_SITE_OTHER): Payer: Medicare Other | Admitting: Family Medicine

## 2020-12-15 ENCOUNTER — Ambulatory Visit
Admission: RE | Admit: 2020-12-15 | Discharge: 2020-12-15 | Disposition: A | Discharge status: Home / Self Care | Payer: Medicare Other | Source: Ambulatory Visit | Attending: Family Medicine | Admitting: Family Medicine

## 2020-12-15 VITALS — BP 130/72 | HR 82 | Temp 98.0°F | Resp 14 | Ht 68.0 in | Wt 226.0 lb

## 2020-12-15 DIAGNOSIS — Z87440 Personal history of urinary (tract) infections: Secondary | ICD-10-CM | POA: Diagnosis not present

## 2020-12-15 DIAGNOSIS — H8110 Benign paroxysmal vertigo, unspecified ear: Secondary | ICD-10-CM | POA: Diagnosis not present

## 2020-12-15 DIAGNOSIS — Z1231 Encounter for screening mammogram for malignant neoplasm of breast: Secondary | ICD-10-CM

## 2020-12-15 LAB — URINALYSIS, ROUTINE W REFLEX MICROSCOPIC
Bacteria, UA: NONE SEEN /HPF
Bilirubin Urine: NEGATIVE
Glucose, UA: NEGATIVE
Hgb urine dipstick: NEGATIVE
Ketones, ur: NEGATIVE
Leukocytes,Ua: NEGATIVE
Nitrite: NEGATIVE
RBC / HPF: NONE SEEN /HPF (ref 0–2)
Specific Gravity, Urine: 1.025 (ref 1.001–1.03)
WBC, UA: NONE SEEN /HPF (ref 0–5)
pH: 5.5 (ref 5.0–8.0)

## 2020-12-15 LAB — MICROSCOPIC MESSAGE

## 2020-12-15 MED ORDER — MECLIZINE HCL 25 MG PO TABS: 25.0000 mg | ORAL_TABLET | Freq: Three times a day (TID) | ORAL | 0 refills | Status: DC | PRN

## 2020-12-15 NOTE — Progress Notes (Signed)
Subjective:    Patient ID: Sheri Hernandez, female    DOB: 04-07-1956, 65 y.o.   MRN: [id number]  HPI Patient has a history of coronary artery disease.  She also has a history of metastatic renal cell adenocarcinoma.  She presents with a 1 month history of dizziness.  She states that she gets attacks of dizziness.  It particularly happens with when she moves or if she changes directions.  She states that she feels like the world is moving around her.  There is a little bit of a rotational component.  It is triggered by changes in position.  If she sits completely still, she does not feel vertigo or off-balance.  She denies any syncope or presyncope.  She denies any headaches.  She denies any other neurologic deficit.  She is able to do heel-to-toe walk normally in the hallway today under my evaluation.  Romberg exam is normal.  Finger-to-nose testing is normal.  However when she turns quickly on heel-to-toe walk, she felt off balance and a little bit of a rotation in her surroundings.  She would not allow Dix-Hallpike maneuver today because she was afraid it would make her sick.  This leads me to suspect vertigo Past Medical History:  Diagnosis Date  . Acute blood loss anemia 01/12/2015  . Adenocarcinoma, renal cell ([place]) 11/12/2012   Overview:   09/25/12 - CXR - lung masses  CT CAP - rt hilar mass, mult pulm nodules, LLL mass 3 x 3.1 cm, RLL mass 2.2 x 2.0 cm, left kidney mass 12.4 x 11.1 x 10.3 cm  10/05/12 PET - left lung mass 30 mm SUV 16.7, rt lung nodule 21 mm, SUV 10.4, left kidney mass 7 cm SUV 18, mass compressing RLL bronchus  10/11/12 lung biopsy - atypical cells  10/23/12 left kidney biopsy - high gr clear cell RCC   . Anxiety   . Arthritis    "maybe in my right hand" (05/26/2017)  . CAD (coronary artery disease)    a. 12/2014 Cath: LM nl, LAD 20p, 47m, D2 20ost, LCX 95p, 80m, RCA dominant, 95p, 88m/d, RPDA 95, RPL2 50, attempted RCA PCI w/ acute RCA occlusion-->CABG x 2 VG->RPDA->PLVB.  Marland Kitchen  Chronic diastolic CHF (congestive heart failure) ([place])   . Drug-induced hypothyroidism 03/13/2014  . GERD (gastroesophageal reflux disease) 03/18/2015  . Hyperglycemia 01/12/2015  . Hypertension   . Hypoalbuminemia   . Hypothyroidism   . Lower extremity edema   . Lung mass    LUNG MASSES--COUGH-BIOPSY NON-DIAGNOSTIC.  HX OF MEDIASTINAL MASS AND LUNG MASSES AGE 55 -NEGATIVE BIOSPY-BUT GIVEN DIAGNOSIS OF SARCOIDOSIS.  WORK UP CONTINUING ON DIAGNOSIS FOR PT'S LUNG MASSES--SHE HAS BIOPSY PROVEN KIDNEY CANCER  . Mass of left breast 08/01/2011  . Myocardial infarction ([place]) 12/2014 X 2  . 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  . Papilloma of breast 2012   left. lumpectomy  . Pulmonary nodules   . Renal cell cancer [facility])    metastatic renal cell carcinoma (biopsy of left renal mass 10/2012) On nivolumab at [facility]  . Renal mass 10/01/2012   10/23/12 Bx Pos high grad clear cell renal cell ca> referred to oncology 10/26/2012    . S/p nephrectomy    left, due to metastatic RCC,  3/14   Past Surgical History:  Procedure Laterality Date  . BREAST BIOPSY  08/18/2011   Procedure: BREAST BIOPSY WITH NEEDLE LOCALIZATION;  Surgeon: Merrie Roof, MD;  Location: [place];  Service: General;  Laterality: Left;  left breast biopsy with needle localization  . BRONCHIAL BRUSH BIOPSY  2014  . CARDIAC CATHETERIZATION    . CORONARY ARTERY BYPASS GRAFT N/A 01/02/2015   Procedure: CORONARY ARTERY BYPASS GRAFTING (CABG)TIMES TWO USING RIGHT GREATER SAPHENOUS LEG VEIN HARVESTED ENDOSCOPICALLY;  Surgeon: [name], MD;  Location: [place];  Service: Open Heart Surgery;  Laterality: N/A;  . LAPAROSCOPIC NEPHRECTOMY Left 11/22/2012   Procedure: LAPAROSCOPIC NEPHRECTOMY;  Surgeon: Dutch Gray, MD;  [facility];   Service: Urology;  Laterality: Left;  . LEFT HEART CATH AND CORS/GRAFTS ANGIOGRAPHY N/A 10/11/2016   Procedure: Left Heart Cath and Cors/Grafts Angiography;  Surgeon: Sherren Mocha, MD;  Location: [place] CV LAB;  Service: Cardiovascular;  Laterality: N/A;  . LEFT HEART CATHETERIZATION WITH CORONARY ANGIOGRAM N/A 01/02/2015   Procedure: LEFT HEART CATHETERIZATION WITH CORONARY ANGIOGRAM;  Surgeon: Leonie Man, MD;  Location: [facility] CATH LAB;  Service: Cardiovascular;  Laterality: N/A;  . NECK LESION BIOPSY Left 08/2016  . PORTA CATH INSERTION Right ~ 2017  . TONSILLECTOMY  1967  . VIDEO BRONCHOSCOPY  10/11/2012   Procedure: VIDEO BRONCHOSCOPY WITH FLUORO;  Surgeon: Kathee Delton, MD;  [facility];  Service: Cardiopulmonary;  Laterality: Bilateral;  . WIDS    . WISDOM TOOTH EXTRACTION     "all 4"   Current Outpatient Medications on File Prior to Visit  Medication Sig Dispense Refill  . acetaminophen (TYLENOL) 500 MG tablet Take 500-1,000 mg by mouth every 6 (six) hours as needed for mild pain or headache.    Marland Kitchen amLODipine (NORVASC) 10 MG tablet Take 1 tablet (10 mg total) by mouth daily. 90 tablet 3  . aspirin 81 MG EC tablet Take 1 tablet (81 mg total) by mouth daily.    Marland Kitchen atorvastatin (LIPITOR) 40 MG tablet TAKE 1 TABLET (40 MG TOTAL) BY MOUTH DAILY. 90 tablet 3  . Cholecalciferol (VITAMIN D PO) Take 1 tablet by mouth daily.     . Cholecalciferol (VITAMIN D-3) 25 MCG (1000 UT) CAPS Take 1,000 Units by mouth daily with breakfast.    . clopidogrel (PLAVIX) 75 MG tablet TAKE 1 TABLET BY MOUTH EVERY DAY 90 tablet 3  . furosemide (LASIX) 40 MG tablet Take 1 tablet (40 mg total) by mouth daily. 90 tablet 3  . HYDROcodone-acetaminophen (NORCO) 5-325 MG tablet Take 1 tablet by mouth every 6 (six) hours as needed for moderate pain. 30 tablet 0  . isosorbide mononitrate (IMDUR) 120 MG 24 hr tablet Take 1 tablet (120 mg total) by mouth daily. 90 tablet 3  . lidocaine-prilocaine (EMLA)  cream Apply 1 application topically See admin instructions. APPLY CREAM TO PORTACATH SITE APPROXIMATELY 1 HOUR BEFORE ACCESS AND COVER    . metoprolol tartrate (LOPRESSOR) 100 MG tablet TAKE 1 TABLET BY MOUTH 2 TIMES DAILY. MAKE YEARLY APPT WITH DR. Burt Knack FOR JUNE FOR FUTURE REFILLS. 180 tablet 2  . nitroGLYCERIN (NITROSTAT) 0.4 MG SL tablet PLACE 1 TABLET UNDER THE TONGUE EVERY 5 MINUTES AS NEEDED FOR CHEST PAIN 25 tablet 6  . ondansetron (ZOFRAN ODT) 4 MG disintegrating tablet Take 1 tablet (4 mg total) by mouth every 8 (eight) hours as needed for nausea or vomiting. 20 tablet 0  . levothyroxine (SYNTHROID) 150 MCG tablet  TAKE 1 TABLET BY MOUTH DAILY BEFORE BREAKFAST. (Patient not taking: Reported on 12/15/2020) 30 tablet 4  . oxycodone (OXY-IR) 5 MG capsule Take 5 mg by mouth daily as needed (severe pain). pain (Patient not taking: Reported on 12/15/2020)     No current facility-administered medications on file prior to visit.   Allergies  Allergen Reactions  . Silver Dermatitis and Other (See Comments)    Please use Mepilex bandaging ("brown/tan, not silver")  . Tape Other (See Comments)    PLEASE DO NOT USE "PLASTIC" TAPE!! IT "RIPS OFF MY SKIN"   Social History   Socioeconomic History  . Marital status: Married    Spouse name: Not on file  . Number of children: 0  . Years of education: Not on file  . Highest education level: Not on file  Occupational History  . 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   Tobacco Use  . Smoking status: Former Smoker    Packs/day: 1.50    Years: 40.00    Pack years: 60.00    Types: Cigarettes    Quit date: 09/24/2012    Years since quitting: 8.2  . Smokeless tobacco: Never Used  Vaping Use  . Vaping Use: Never used  Substance and Sexual Activity  . Alcohol use: Yes    Comment: 05/26/2017 "couple drinks/year"  . Drug use: No  . Sexual activity: Yes  Other Topics Concern  . Not on file  Social History Narrative  .  Not on file   Social Determinants of Health   Financial Resource Strain: Not on file  Food Insecurity: Not on file  Transportation Needs: Not on file  Physical Activity: Not on file  Stress: Not on file  Social Connections: Not on file  Intimate Partner Violence: Not on file     Review of Systems  All other systems reviewed and are negative.      Objective:   Physical Exam Vitals reviewed.  Constitutional:      General: She is not in acute distress.    Appearance: She is not ill-appearing or toxic-appearing.  Cardiovascular:     Rate and Rhythm: Normal rate and regular rhythm.     Pulses: Normal pulses.     Heart sounds: Normal heart sounds. No murmur heard.   Pulmonary:     Effort: Pulmonary effort is normal. No respiratory distress.     Breath sounds: Normal breath sounds. No decreased air movement. No wheezing, rhonchi or rales.  Musculoskeletal:     Right lower leg: No edema.     Left lower leg: No edema.  Neurological:     Mental Status: She is alert and oriented to person, place, and time. Mental status is at baseline.     Cranial Nerves: Cranial nerves are intact.     Sensory: Sensation is intact.     Motor: Motor function is intact. No weakness, tremor, atrophy or abnormal muscle tone.     Coordination: Coordination is intact. Romberg sign negative. Coordination normal. Finger-Nose-Finger Test and Heel to [facility] Test normal.           Assessment & Plan:  Hx: UTI (urinary tract infection) - Plan: Urinalysis, Routine w reflex microscopic  Benign paroxysmal positional vertigo, unspecified laterality  Based on her physical exam and her normal reassuring neurologic exam, I suspect the patient is having vertigo.  I recommended try meclizine 25 mg every 8 hours as needed and see if her symptoms improve over the next  few days.  If coma I would recommend imaging of the brain to rule out metastasis to the brain or potentially small lacunar infarct.  Patient will  call me back with an update later this week.  She refuses me to allow any blood work today.

## 2020-12-28 ENCOUNTER — Other Ambulatory Visit: Payer: Self-pay | Admitting: Cardiovascular Disease

## 2020-12-28 DIAGNOSIS — E785 Hyperlipidemia, unspecified: Secondary | ICD-10-CM

## 2020-12-28 DIAGNOSIS — I25708 Atherosclerosis of coronary artery bypass graft(s), unspecified, with other forms of angina pectoris: Secondary | ICD-10-CM

## 2021-01-05 ENCOUNTER — Other Ambulatory Visit: Payer: Self-pay | Admitting: Cardiovascular Disease

## 2021-02-03 NOTE — Progress Notes (Signed)
Cardiology Office Note:    Date:  02/04/2021   ID:  Sheri Hernandez, DOB 10-14-1955, MRN [id number]  PCP:  Susy Frizzle, MD   [facility] Providers Cardiologist:  Sherren Mocha, MD     Referring MD: Susy Frizzle, MD   Chief Complaint  Patient presents with  . Coronary Artery Disease    History of Present Illness:    Sheri Hernandez is a 65 y.o. female with a hx of CAD with chronic angina, returning for follow-up evaluation.   The patient has a complicated history, initially presenting with non-STEMI in 2016 and undergoing complex PCI of severe stenosis in the RCA complicated by extensive dissection requiring emergency CABG with the saphenous vein graft to PDA and PLA.  The patient has had recurrent angina and follow-up catheterization demonstrated occlusion of her bypass graft.  The RCA remained patent with tight residual stenosis and medical therapy was recommended in the setting of controlled symptoms.  Comorbid conditions include history of metastatic renal cell carcinoma status post left nephrectomy, breast cancer status post left lumpectomy, hypertension, and mixed hyperlipidemia.  She was last seen in our office in September of this year for follow-up after an emergency room visit with marked hypertension.  Amlodipine was increased and she was recommended to stay on a low-sodium diet.  An echocardiogram was done and this demonstrated no significant valvular disease and also normal LV function.  She is here alone today. She tells me that she has an abdominal hernia and has seen a Psychologist, sport and exercise at [facility] for consideration of surgical repair. She is still considering whether to do this. She reports stable angina and probably improved from past, rarely taking NTG now. No dyspnea, orthopnea, or PND. She is struggling with leg edema, especially involving the right leg. She can't get compression socks on. Doesn't elevate her legs regularly.  Past Medical History:  Diagnosis Date  .  Acute blood loss anemia 01/12/2015  . Adenocarcinoma, renal cell ([place]) 11/12/2012   Overview:   09/25/12 - CXR - lung masses  CT CAP - rt hilar mass, mult pulm nodules, LLL mass 3 x 3.1 cm, RLL mass 2.2 x 2.0 cm, left kidney mass 12.4 x 11.1 x 10.3 cm  10/05/12 PET - left lung mass 30 mm SUV 16.7, rt lung nodule 21 mm, SUV 10.4, left kidney mass 7 cm SUV 18, mass compressing RLL bronchus  10/11/12 lung biopsy - atypical cells  10/23/12 left kidney biopsy - high gr clear cell RCC   . Anxiety   . Arthritis    "maybe in my right hand" (05/26/2017)  . CAD (coronary artery disease)    a. 12/2014 Cath: LM nl, LAD 20p, 10m, D2 20ost, LCX 95p, 46m, RCA dominant, 95p, 22m/d, RPDA 95, RPL2 50, attempted RCA PCI w/ acute RCA occlusion-->CABG x 2 VG->RPDA->PLVB.  Marland Kitchen Chronic diastolic CHF (congestive heart failure) ([place])   . Drug-induced hypothyroidism 03/13/2014  . GERD (gastroesophageal reflux disease) 03/18/2015  . Hyperglycemia 01/12/2015  . Hypertension   . Hypoalbuminemia   . Hypothyroidism   . Lower extremity edema   . Lung mass    LUNG MASSES--COUGH-BIOPSY NON-DIAGNOSTIC.  HX OF MEDIASTINAL MASS AND LUNG MASSES AGE 89 -NEGATIVE BIOSPY-BUT GIVEN DIAGNOSIS OF SARCOIDOSIS.  WORK UP CONTINUING ON DIAGNOSIS FOR PT'S LUNG MASSES--SHE HAS BIOPSY PROVEN KIDNEY CANCER  . Mass of left breast 08/01/2011  . Myocardial infarction ([place]) 12/2014 X 2  . 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  . Papilloma of breast 2012   left. lumpectomy  . Pulmonary nodules   . Renal cell cancer [facility])    metastatic renal cell carcinoma (biopsy of left renal mass 10/2012) On nivolumab at [facility]  . Renal mass 10/01/2012   10/23/12 Bx Pos high grad clear cell renal cell ca> referred to oncology 10/26/2012    . S/p nephrectomy    left, due  to metastatic RCC, 3/14    Past Surgical History:  Procedure Laterality Date  . BREAST BIOPSY  08/18/2011   Procedure: BREAST BIOPSY WITH NEEDLE LOCALIZATION;  Surgeon: Merrie Roof, MD;  Location: [place];  Service: General;  Laterality: Left;  left breast biopsy with needle localization  . BRONCHIAL BRUSH BIOPSY  2014  . CARDIAC CATHETERIZATION    . CORONARY ARTERY BYPASS GRAFT N/A 01/02/2015   Procedure: CORONARY ARTERY BYPASS GRAFTING (CABG)TIMES TWO USING RIGHT GREATER SAPHENOUS LEG VEIN HARVESTED ENDOSCOPICALLY;  Surgeon: [name], MD;  Location: [place];  Service: Open Heart Surgery;  Laterality: N/A;  . LAPAROSCOPIC NEPHRECTOMY Left 11/22/2012   Procedure: LAPAROSCOPIC NEPHRECTOMY;  Surgeon: Dutch Gray, MD;  Location: [facility];  Service: Urology;  Laterality: Left;  . LEFT HEART CATH AND CORS/GRAFTS ANGIOGRAPHY N/A 10/11/2016   Procedure: Left Heart Cath and Cors/Grafts Angiography;  Surgeon: Sherren Mocha, MD;  Location: [place] CV LAB;  Service: Cardiovascular;  Laterality: N/A;  . LEFT HEART CATHETERIZATION WITH CORONARY ANGIOGRAM N/A 01/02/2015   Procedure: LEFT HEART CATHETERIZATION WITH CORONARY ANGIOGRAM;  Surgeon: Leonie Man, MD;  Location[facility] CATH LAB;  Service: Cardiovascular;  Laterality: N/A;  . NECK LESION BIOPSY Left 08/2016  . PORTA CATH INSERTION Right ~ 2017  . TONSILLECTOMY  1967  . VIDEO BRONCHOSCOPY  10/11/2012   Procedure: VIDEO BRONCHOSCOPY WITH FLUORO;  Surgeon: Kathee Delton, MD;  Location: [facility];  Service: Cardiopulmonary;  Laterality: Bilateral;  . WIDS    . WISDOM TOOTH EXTRACTION     "all 4"    Current Medications: Current Meds  Medication Sig  . acetaminophen (TYLENOL) 500 MG tablet Take 500-1,000 mg by mouth every 6 (six) hours as needed for mild pain or headache.  Marland Kitchen aspirin 81 MG EC tablet Take 1 tablet (81 mg total) by mouth daily.  Marland Kitchen atorvastatin (LIPITOR) 40 MG tablet TAKE 1 TABLET (40 MG TOTAL) BY MOUTH DAILY.  Marland Kitchen  Cholecalciferol (VITAMIN D PO) Take 1 tablet by mouth daily.   . Cholecalciferol (VITAMIN D-3) 25 MCG (1000 UT) CAPS Take 1,000 Units by mouth daily with breakfast.  . furosemide (LASIX) 40 MG tablet Take 1 tablet (40 mg total) by mouth daily.  Marland Kitchen HYDROcodone-acetaminophen (NORCO) 5-325 MG tablet Take 1 tablet by mouth every 6 (six) hours as needed for moderate pain.  . isosorbide mononitrate (IMDUR) 120 MG 24 hr tablet Take 1 tablet (120 mg total) by mouth daily.  Marland Kitchen levothyroxine (SYNTHROID) 150 MCG tablet TAKE 1 TABLET BY MOUTH DAILY BEFORE BREAKFAST.  Marland Kitchen lidocaine-prilocaine (EMLA) cream Apply 1 application topically See admin instructions. APPLY CREAM TO PORTACATH SITE APPROXIMATELY 1 HOUR BEFORE ACCESS AND COVER  . losartan (COZAAR) 100 MG tablet Take 1 tablet (100 mg total) by mouth daily.  . meclizine (ANTIVERT) 25 MG tablet Take 1 tablet (25 mg total) by  mouth 3 (three) times daily as needed for dizziness.  . ondansetron (ZOFRAN ODT) 4 MG disintegrating tablet Take 1 tablet (4 mg total) by mouth every 8 (eight) hours as needed for nausea or vomiting.  Marland Kitchen oxycodone (OXY-IR) 5 MG capsule Take 5 mg by mouth daily as needed (severe pain). pain  . [DISCONTINUED] amLODipine (NORVASC) 10 MG tablet Take 1 tablet (10 mg total) by mouth daily.  . [DISCONTINUED] clopidogrel (PLAVIX) 75 MG tablet TAKE 1 TABLET BY MOUTH EVERY DAY  . [DISCONTINUED] metoprolol tartrate (LOPRESSOR) 100 MG tablet TAKE 1 TABLET BY MOUTH 2 TIMES DAILY. MAKE YEARLY APPT WITH DR. Burt Knack FOR JUNE FOR FUTURE REFILLS.  . [DISCONTINUED] nitroGLYCERIN (NITROSTAT) 0.4 MG SL tablet PLACE 1 TABLET UNDER THE TONGUE EVERY 5 MINUTES AS NEEDED FOR CHEST PAIN     Allergies:   Silver and Tape   Social History   Socioeconomic History  . Marital status: Married    Spouse name: Not on file  . Number of children: 0  . Years of education: Not on file  . Highest education level: Not on file  Occupational History  . 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   Tobacco Use  . Smoking status: Former Smoker    Packs/day: 1.50    Years: 40.00    Pack years: 60.00    Types: Cigarettes    Quit date: 09/24/2012    Years since quitting: 8.3  . Smokeless tobacco: Never Used  Vaping Use  . Vaping Use: Never used  Substance and Sexual Activity  . Alcohol use: Yes    Comment: 05/26/2017 "couple drinks/year"  . Drug use: No  . Sexual activity: Yes  Other Topics Concern  . Not on file  Social History Narrative  . Not on file   Social Determinants of Health   Financial Resource Strain: Not on file  Food Insecurity: Not on file  Transportation Needs: Not on file  Physical Activity: Not on file  Stress: Not on file  Social Connections: Not on file     Family History: The patient's family history includes Aortic dissection in her brother; Breast cancer in her maternal aunt; Cancer in her maternal grandfather; Colon cancer in her maternal aunt; Heart attack in her father; Heart disease in her brother, father, maternal grandmother, and mother; Skin cancer in her maternal uncle; Stomach cancer in her maternal uncle; Stroke in her brother.  ROS:   Please see the history of present illness.    All other systems reviewed and are negative.  EKGs/Labs/Other Studies Reviewed:    The following studies were reviewed today: Echo 06/03/2020: IMPRESSIONS    1. Left ventricular ejection fraction, by estimation, is 55 to 60%. The  left ventricle has normal function. The left ventricle has no regional  wall motion abnormalities. Left ventricular diastolic parameters are  indeterminate. The average left  ventricular global longitudinal strain is -15.3 %. The global longitudinal  strain is abnormal.  2. Right ventricular systolic function is normal. The right ventricular  size is normal. There is normal pulmonary artery systolic pressure.  3. The mitral valve is normal in structure. Trivial mitral valve   regurgitation. No evidence of mitral stenosis.  4. The aortic valve was not well visualized. Aortic valve regurgitation  is trivial. No aortic stenosis is present.  5. The inferior vena cava is normal in size with greater than 50%  respiratory variability, suggesting right atrial pressure of 3 mmHg.   EKG:  EKG is not ordered today.   Recent Labs: 05/22/2020: ALT 23; BUN 17; Creatinine, Ser 0.70; Hemoglobin 16.0; Magnesium 2.1; Platelets 343; Potassium 4.7; Sodium 143; TSH 0.503  Recent Lipid Panel    Component Value Date/Time   CHOL 90 01/03/2015 0210   TRIG 77 01/03/2015 0210   HDL 15 (L) 01/03/2015 0210   CHOLHDL 6.0 01/03/2015 0210   VLDL 15 01/03/2015 0210   LDLCALC 60 01/03/2015 0210     Risk Assessment/Calculations:       Physical Exam:    VS:  BP 130/80   Pulse 66   Ht 5\' 9"  (1.753 m)   Wt 228 lb 6.4 oz (103.6 kg)   SpO2 94%   BMI 33.73 kg/m     Wt Readings from Last 3 Encounters:  02/04/21 228 lb 6.4 oz (103.6 kg)  12/15/20 226 lb (102.5 kg)  10/13/20 227 lb (103 kg)     GEN:  Well nourished, well developed in no acute distress HEENT: Normal NECK: No JVD; No carotid bruits LYMPHATICS: No lymphadenopathy CARDIAC: RRR, 2/6 systolic murmur at the RUSB RESPIRATORY:  Clear to auscultation without rales, wheezing or rhonchi  ABDOMEN: Soft, non-tender, non-distended MUSCULOSKELETAL:  3+ edema right leg, 1-2+ left leg; No deformity  SKIN: Warm and dry NEUROLOGIC:  Alert and oriented x 3 PSYCHIATRIC:  Normal affect   ASSESSMENT:    1. Coronary artery disease involving native coronary artery of native heart with angina pectoris ([place])   2. Mixed hyperlipidemia   3. Essential hypertension   4. Lymphedema   5. Coronary artery disease involving coronary bypass graft of native heart with other forms of angina pectoris ([place])   6. Hyperlipidemia, unspecified hyperlipidemia type    PLAN:    In order of problems listed above:  1. Stable angina,  well-controlled at present. Medications reviewed and will be continued.  2. Treated with atorvastatin 40 mg.  3. BP well-controlled.  The patient's chart and medication history is reviewed.  Amlodipine was added about 9 months ago after she was seen in the emergency room with hypertensive urgency.  I think this is probably exacerbated her chronic leg edema significantly.  We will discontinue amlodipine and start her on losartan 100 mg daily.  We will check a metabolic panel in a few weeks. 4. Symptoms are progressive and right leg edema is impressive on her exam today.  Her exam has some characteristics of lymphedema.  I am not sure how much amlodipine is exacerbating the situation.  See above for change in medicines.  Will refer her to a lymphedema specialist for consideration of physical therapy, compression, or a pump if indicated.   Medication Adjustments/Labs and Tests Ordered: Current medicines are reviewed at length with the patient today.  Concerns regarding medicines are outlined above.  Orders Placed This Encounter  Procedures  . Basic metabolic panel  . Ambulatory referral to Physical Therapy   Meds ordered this encounter  Medications  . losartan (COZAAR) 100 MG tablet    Sig: Take 1 tablet (100 mg total) by mouth daily.    Dispense:  90 tablet    Refill:  3  . clopidogrel (PLAVIX) 75 MG tablet    Sig: Take 1 tablet (75 mg total) by mouth daily.    Dispense:  90 tablet    Refill:  3  . metoprolol tartrate (LOPRESSOR) 100 MG tablet    Sig: Take 1 tablet (100 mg total) by mouth 2 (two) times daily.    Dispense:  180 tablet    Refill:  3  . nitroGLYCERIN (NITROSTAT) 0.4 MG SL tablet    Sig: Place 1 tablet (0.4 mg total) under the tongue every 5 (five) minutes as needed for chest pain.    Dispense:  90 tablet    Refill:  0    Patient Instructions  Medication Instructions:  1) STOP AMLODIPINE 2) START LOSARTAN 100 mg daily *If you need a refill on your cardiac medications  before your next appointment, please call your pharmacy*  Lab Work: BMET in a couple weeks (please have this done at [facility] between 02/17/21 and 02/26/21). If you have labs (blood work) drawn today and your tests are completely normal, you will receive your results only by: Marland Kitchen MyChart Message (if you have MyChart) OR . A paper copy in the mail If you have any lab test that is abnormal or we need to change your treatment, we will call you to review the results.  Follow-Up: You have been referred to [facility].  At [facility], you and your health needs are our priority.  As part of our continuing mission to provide you with exceptional heart care, we have created designated Provider Care Teams.  These Care Teams include your primary Cardiologist (physician) and Advanced Practice Providers (APPs -  Physician Assistants and Nurse Practitioners) who all work together to provide you with the care you need, when you need it. Your next appointment:   6-9 month(s) The format for your next appointment:   In Person Provider:   You may see Sherren Mocha, MD or one of the following Advanced Practice Providers on your designated Care Team:    Richardson Dopp, PA-C  Robbie Lis, [place]      Signed, Sherren Mocha, MD  02/04/2021 1:36 PM    [place]

## 2021-02-04 ENCOUNTER — Encounter: Payer: Self-pay | Admitting: Cardiovascular Disease

## 2021-02-04 ENCOUNTER — Ambulatory Visit: Payer: Medicare Other | Admitting: Cardiovascular Disease

## 2021-02-04 ENCOUNTER — Other Ambulatory Visit: Payer: Self-pay

## 2021-02-04 VITALS — BP 130/80 | HR 66 | Ht 69.0 in | Wt 228.4 lb

## 2021-02-04 DIAGNOSIS — E782 Mixed hyperlipidemia: Secondary | ICD-10-CM | POA: Diagnosis not present

## 2021-02-04 DIAGNOSIS — I10 Essential (primary) hypertension: Secondary | ICD-10-CM

## 2021-02-04 DIAGNOSIS — I25119 Atherosclerotic heart disease of native coronary artery with unspecified angina pectoris: Secondary | ICD-10-CM | POA: Diagnosis not present

## 2021-02-04 DIAGNOSIS — I25708 Atherosclerosis of coronary artery bypass graft(s), unspecified, with other forms of angina pectoris: Secondary | ICD-10-CM

## 2021-02-04 DIAGNOSIS — I890 Lymphedema, not elsewhere classified: Secondary | ICD-10-CM

## 2021-02-04 DIAGNOSIS — E785 Hyperlipidemia, unspecified: Secondary | ICD-10-CM

## 2021-02-04 MED ORDER — NITROGLYCERIN 0.4 MG SL SUBL: 0.4000 mg | SUBLINGUAL_TABLET | SUBLINGUAL | 0 refills | Status: DC | PRN

## 2021-02-04 MED ORDER — LOSARTAN POTASSIUM 100 MG PO TABS: 100.0000 mg | ORAL_TABLET | Freq: Every day | ORAL | 3 refills | Status: DC

## 2021-02-04 MED ORDER — METOPROLOL TARTRATE 100 MG PO TABS: 100.0000 mg | ORAL_TABLET | Freq: Two times a day (BID) | ORAL | 3 refills | Status: DC

## 2021-02-04 MED ORDER — CLOPIDOGREL BISULFATE 75 MG PO TABS: 1.0000 | ORAL_TABLET | Freq: Every day | ORAL | 3 refills | Status: DC

## 2021-02-04 NOTE — Patient Instructions (Addendum)
Medication Instructions:  1) STOP AMLODIPINE 2) START LOSARTAN 100 mg daily *If you need a refill on your cardiac medications before your next appointment, please call your pharmacy*  Lab Work: BMET in a couple weeks (please have this done at [facility] between 02/17/21 and 02/26/21). If you have labs (blood work) drawn today and your tests are completely normal, you will receive your results only by: Marland Kitchen MyChart Message (if you have MyChart) OR . A paper copy in the mail If you have any lab test that is abnormal or we need to change your treatment, we will call you to review the results.  Follow-Up: You have been referred to [facility].  At [facility], you and your health needs are our priority.  As part of our continuing mission to provide you with exceptional heart care, we have created designated Provider Care Teams.  These Care Teams include your primary Cardiologist (physician) and Advanced Practice Providers (APPs -  Physician Assistants and Nurse Practitioners) who all work together to provide you with the care you need, when you need it. Your next appointment:   6-9 month(s) The format for your next appointment:   In Person Provider:   You may see Sherren Mocha, MD or one of the following Advanced Practice Providers on your designated Care Team:    Richardson Dopp, PA-C  [place], [place]

## 2021-02-11 ENCOUNTER — Telehealth: Payer: Self-pay | Admitting: Cardiovascular Disease

## 2021-02-11 NOTE — Telephone Encounter (Signed)
*  STAT* If patient is at the pharmacy, call can be transferred to refill team.   1. Which medications need to be refilled? (please list name of each medication and dose if known)  clopidogrel (PLAVIX) 75 MG tablet  2. Which pharmacy/location (including street and city if local pharmacy) is medication to be sent to? [id number] [place], [place] - [address]  3. Do they need a 30 day or 90 day supply? 90 with refills

## 2021-02-11 NOTE — Telephone Encounter (Signed)
Called pt to inform her that her medication was already sent to her pharmacy as requested and that I called her pharmacy and they are getting medication ready for pt to pick up. I advised the pt that if she has any other problems, questions or concerns, please give our office a call back. Pt verbalized understanding.

## 2021-02-23 ENCOUNTER — Telehealth (HOSPITAL_COMMUNITY): Payer: Self-pay | Admitting: Physical Therapy

## 2021-02-23 ENCOUNTER — Ambulatory Visit (HOSPITAL_COMMUNITY): Payer: Medicare Other | Attending: Cardiovascular Disease | Admitting: Physical Therapy

## 2021-02-23 ENCOUNTER — Other Ambulatory Visit: Payer: Self-pay

## 2021-02-23 DIAGNOSIS — I890 Lymphedema, not elsewhere classified: Secondary | ICD-10-CM | POA: Insufficient documentation

## 2021-02-23 NOTE — Telephone Encounter (Signed)
Pt and Cinday agreed pt can return to PT Lymphedema tx after her vacation. Patient will call back to schedule treatments in AUG. 3xwk-

## 2021-02-23 NOTE — Therapy (Signed)
[facility] [address] [place], [place], [address] Phone: [phone]   Fax:  [phone]  Physical Therapy Evaluation  Patient Details  Name: Sheri [place] MRN: [id number] Date of Birth: 1956/04/11 Referring Provider (PT): Sherren Mocha   Encounter Date: 02/23/2021   PT End of Session - 02/23/21 1446     Visit Number 1    Number of Visits 18    Date for PT Re-Evaluation 05/04/21   PT will not be able to start treatment until early August as she is going on a 3 week cruise.   Authorization Type [employer] Medicare             Past Medical History:  Diagnosis Date   Acute blood loss anemia 01/12/2015   Adenocarcinoma, renal cell ([place]) 11/12/2012   Overview:   09/25/12 - CXR - lung masses  CT CAP - rt hilar mass, mult pulm nodules, LLL mass 3 x 3.1 cm, RLL mass 2.2 x 2.0 cm, left kidney mass 12.4 x 11.1 x 10.3 cm  10/05/12 PET - left lung mass 30 mm SUV 16.7, rt lung nodule 21 mm, SUV 10.4, left kidney mass 7 cm SUV 18, mass compressing RLL bronchus  10/11/12 lung biopsy - atypical cells  10/23/12 left kidney biopsy - high gr clear cell RCC    Anxiety    Arthritis    "maybe in my right hand" (05/26/2017)   CAD (coronary artery disease)    a. 12/2014 Cath: LM nl, LAD 20p, 70m, D2 20ost, LCX 95p, 51m, RCA dominant, 95p, 75m/d, RPDA 95, RPL2 50, attempted RCA PCI w/ acute RCA occlusion-->CABG x 2 VG->RPDA->PLVB.   Chronic diastolic CHF (congestive heart failure) (HCC)    Drug-induced hypothyroidism 03/13/2014   GERD (gastroesophageal reflux disease) 03/18/2015   Hyperglycemia 01/12/2015   Hypertension    Hypoalbuminemia    Hypothyroidism    Lower extremity edema    Lung mass    LUNG MASSES--COUGH-BIOPSY NON-DIAGNOSTIC.  HX OF MEDIASTINAL MASS AND LUNG MASSES AGE 45 -NEGATIVE BIOSPY-BUT GIVEN DIAGNOSIS OF SARCOIDOSIS.  WORK UP CONTINUING ON DIAGNOSIS FOR PT'S LUNG MASSES--SHE HAS BIOPSY PROVEN KIDNEY CANCER   Mass of left breast 08/01/2011    Myocardial infarction ([place]) 12/2014 X 2   Pain    HX OF EPISODES OF BREIF PAIN BACK OF HEAD-RIGHT SIDE- OCCURS WHEN PT TURNS HER HEAD TO RIGHT--BUT DOESN'T HAPPEN EVERY TIME SHE TURNS HER HEAD TO RIGHT--SHE HAS HAD ALL HER LIFE AND STATES PAST WORK [employer]- NEGATIVE FOR ANY BRAIN ISSUES.   Papilloma of breast 2012   left. lumpectomy   Pulmonary nodules    Renal cell cancer (HCC)    metastatic renal cell carcinoma (biopsy of left renal mass 10/2012) On nivolumab at [facility]   Renal mass 10/01/2012   10/23/12 Bx Pos high grad clear cell renal cell ca> referred to oncology 10/26/2012     S/p nephrectomy    left, due to metastatic RCC, 3/14    Past Surgical History:  Procedure Laterality Date   BREAST BIOPSY  08/18/2011   Procedure: BREAST BIOPSY WITH NEEDLE LOCALIZATION;  Surgeon: Merrie Roof, MD;  Location: [facility] [place] [facility];  Service: General;  Laterality: Left;  left breast biopsy with needle localization   BRONCHIAL BRUSH BIOPSY  2014   CARDIAC CATHETERIZATION     CORONARY ARTERY BYPASS GRAFT N/A 01/02/2015   Procedure: CORONARY ARTERY BYPASS GRAFTING (CABG)TIMES TWO USING RIGHT GREATER SAPHENOUS LEG VEIN HARVESTED ENDOSCOPICALLY;  Surgeon: Lilia Argue  Servando Snare, MD;  Location: [facility];  Service: Open Heart Surgery;  Laterality: N/A;   LAPAROSCOPIC NEPHRECTOMY Left 11/22/2012   Procedure: LAPAROSCOPIC NEPHRECTOMY;  Surgeon: Dutch Gray, MD;  Location: [facility];  Service: Urology;  Laterality: Left;   LEFT HEART CATH AND CORS/GRAFTS ANGIOGRAPHY N/A 10/11/2016   Procedure: Left Heart Cath and Cors/Grafts Angiography;  Surgeon: Sherren Mocha, MD;  Location: [facility];  Service: Cardiovascular;  Laterality: N/A;   LEFT HEART CATHETERIZATION WITH CORONARY ANGIOGRAM N/A 01/02/2015   Procedure: LEFT HEART CATHETERIZATION WITH CORONARY ANGIOGRAM;  Surgeon: Leonie Man, MD;  Location: [facility];  Service: Cardiovascular;  Laterality: N/A;   NECK LESION BIOPSY Left 08/2016   PORTA CATH INSERTION Right ~ 2017    [place] BRONCHOSCOPY  10/11/2012   Procedure: VIDEO BRONCHOSCOPY WITH FLUORO;  Surgeon: Kathee Delton, MD;  Location: [facility];  Service: Cardiopulmonary;  Laterality: Bilateral;   WIDS     WISDOM TOOTH EXTRACTION     "all 4"    There were no vitals filed for this visit.    Subjective Assessment - 02/23/21 1315     Subjective [name] states that she has been having increased swelling in both of her legs since she had a double bypass in 2016.  She  states that her MD kept on saying edema, edema, edema but did not say to do anything.  Her leg swelling increased so she did some investigating and asked the MD if it could be lymphedema and he said yes and sent her to this clinic.    Pertinent History 2 MI, Double bypass, Kidney cancer with LT kidney removed, DM    How long can you sit comfortably? no problem    How long can you stand comfortably? 30    How long can you walk comfortably? 15    Patient Stated Goals smaller legs    Currently in Pain? Yes    Pain Score 7     Pain Location Leg    Pain Orientation Left    Pain Descriptors / Indicators Tender    Pain Type Chronic pain    Pain Onset More than a month ago    Pain Frequency Constant    Aggravating Factors  dependent position    Pain Relieving Factors elevation    Effect of Pain on Daily Activities limits                [facility] PT Assessment - 02/23/21 0001       Assessment   Medical Diagnosis B LE lymphedema    Referring Provider (PT) Sherren Mocha    Onset Date/Surgical Date --   5 yrs ago   Next MD Visit not scheduled    Prior Therapy none      Precautions   Precautions --   cellulitis     Restrictions   Weight Bearing Restrictions No      Balance Screen   Has the patient fallen in the past 6 months No    Has the patient had a decrease in activity level because of a fear of falling?  No    Is the patient reluctant to leave their home because of a fear of falling?  No      Home  Ecologist residence      Prior Function   Level of Independence Independent      Cognition   Overall Cognitive Status Within  Functional Limits for tasks assessed               LYMPHEDEMA/ONCOLOGY QUESTIONNAIRE - 02/23/21 0001       What other symptoms do you have   Are you Having Heaviness or Tightness Yes    Are you having Pain Yes    Are you having pitting edema Yes    Body Site LE    Is it Hard or Difficult finding clothes that fit Yes      Lymphedema Stage   Stage STAGE 2 SPONTANEOUSLY IRREVERSIBLE      Lymphedema Assessments   Lymphedema Assessments Lower extremities      Right Lower Extremity Lymphedema   30 cm Proximal to Floor at Lateral Plantar Foot 39.7 cm    20 cm Proximal to Floor at Lateral Plantar Foot 37.9 1    10  cm Proximal to Floor at Lateral Malleoli 34.8 cm    Circumference of ankle/heel 33 cm.    5 cm Proximal to 1st MTP Joint 23.8 cm    Across MTP Joint 22 cm      Left Lower Extremity Lymphedema   30 cm Proximal to Floor at Lateral Plantar Foot 37 cm    20 cm Proximal to Floor at Lateral Plantar Foot 31.9 cm    10 cm Proximal to Floor at Lateral Malleoli 32 cm    Circumference of ankle/heel 31.8 cm.    5 cm Proximal to 1st MTP Joint 22.7 cm    Across MTP Joint 21.7 cm                     Objective measurements completed on examination: See above findings.               PT Education - 02/23/21 1443     Education Details What lymphedema is and that is can not be cured.  What our treatments do and that I will need to get clearance from Dr.Benjamin Rush, her oncologist prior to starting treatments.  Exercises that you can do to try and increase lymphatic flow.    Person(s) Educated Patient    Methods Explanation    Comprehension Verbalized understanding;Returned demonstration              PT Short Term Goals - 02/23/21 1531       PT SHORT TERM GOAL #1   Title Pt to state that  she understands what lymphedema is and how it is controlled.    Time 3    Period Weeks    Status New    Target Date 04/23/21   Pt unable to start until Aug.     PT SHORT TERM GOAL #2   Title Pt to have lost 2-3 cm in Rt LE , 1-2 in Lt    Time 3    Period Weeks    Status New               PT Long Term Goals - 02/23/21 1533       PT LONG TERM GOAL #1   Title PT to have loste 3-5 cm in RT LE, 2-3 in LT to decrease pain level to no greater than a 3/10    Time 6   Pt can not start until August.   Period Weeks    Status New    Target Date 05/14/21      PT LONG TERM GOAL #2   Title Pt to have obtained and be using  a compression pump as well as compression garments in order to keep leg volume at it's lowest point .    Time 6    Period Weeks    Status New                    Plan - 02/23/21 1447     Clinical Impression Statement Ms. Knoebel is a 65 yo female who has been referred to skilled PT for B Lymphedema by her cardiologist.  Evaluation demonstrates Rt LE edema greater than the Lt LE, increased pain, decreased activity tolerance. Ms. Biehler will need to be cleared for treatment by her oncologist, Dr. Roslyn Smiling due to having a Lt nephrectomy due to renal cell adenocarcinoma.  Once clearance has been obtaind Ms Shippee will benefit from skilled PT for total decongestive techniques to reduce her volumes and improve her activity tolerance.    Personal Factors and Comorbidities Comorbidity 3+    Comorbidities MI, CABG, nephrectomy,    Examination-Activity Limitations Locomotion Level;Dressing    Examination-Participation Restrictions Other;Community Activity    Stability/Clinical Decision Making Evolving/Moderate complexity    Clinical Decision Making Moderate    Rehab Potential Good    PT Frequency 3x / week    PT Duration 6 weeks    PT Treatment/Interventions Compression bandaging;Manual lymph drainage;Patient/family education;Therapeutic exercise    PT Next Visit  Plan cut foam, begin total decongestive technique.    PT Home Exercise Plan ankle pumps, LAQ, hip ab/adduction, marching, diaphragmic breathing and lymphe squeeze             Patient will benefit from skilled therapeutic intervention in order to improve the following deficits and impairments:  Pain, Difficulty walking, Increased edema  Visit Diagnosis: Lymphedema, not elsewhere classified     Problem List Patient Active Problem List   Diagnosis Date Noted   Chronic diastolic heart failure ([place]) 06/16/2017   Hyperlipidemia 12/14/2016   CAD (coronary artery disease) 10/12/2016   Anxiety 10/10/2016   Near syncope    Chest pain 10/08/2016   Essential hypertension 06/01/2016   GERD (gastroesophageal reflux disease) 03/18/2015   Acute blood loss anemia 01/12/2015   Hyperglycemia 01/12/2015   History of non-ST elevation myocardial infarction (NSTEMI)    S/P CABG x 2 01/03/2015   Drug-induced hypothyroidism 03/13/2014   S/p nephrectomy    Adenocarcinoma, renal cell ([place]) 11/12/2012   Lung mass 10/01/2012   Renal mass 10/01/2012   Mass of left breast 08/01/2011   Rayetta Humphrey, PT CLT [id number]  02/23/2021, 5:12 PM  [place], [place], [address] Phone: [phone]   Fax:  [phone]  Name: Sheri [place] MRN: [id number] Date of Birth: December 24, 1955

## 2021-02-24 ENCOUNTER — Other Ambulatory Visit: Payer: Medicare Other

## 2021-02-24 ENCOUNTER — Telehealth: Payer: Self-pay | Admitting: Cardiovascular Disease

## 2021-02-24 NOTE — Telephone Encounter (Signed)
Follow up:     Patient returning a call back and would like to know can she get this done at the [facility] in [place]. Would like to know if a order can be sent there. The patient went to [facility] and she forgot her hard script. Please advise. Patient is asking to speak with Katie. Patient stating she going on vacation and would like to know if she can not get this done before then can she wait, she will be gone about a month and stating she only has on Kidney.

## 2021-02-24 NOTE — Telephone Encounter (Signed)
Ne message:    Patient calling stating that she is at [facility] and they do not have a order. Patient states Katie told patient to go to [facility] please fax

## 2021-02-24 NOTE — Telephone Encounter (Signed)
    FROM PTS LAST OV WITH DR. Burt Knack ON 02/04/21  Lab Work: BMET in a couple weeks (please have this done at [facility] between 02/17/21 and 02/26/21). If you have labs (blood work) drawn today and your tests are completely normal, you will receive your results only by: [place] (if you have MyChart) OR A paper copy in the mail If you have any lab test that is abnormal or we need to change your treatment, we will call you to review the results.  Pt was advised at last OV with Dr. Burt Knack on 6/2 to go to [facility] between 6/15-6/24 to have a BMET drawn. Pt is there now and the lab is refusing to draw her BMET, until an order for BMET is faxed to them at [phone]. Lab order faxed to number provided with clear instruction to fax results back to our office ATTN: Dr. Burt Knack at [phone].  Fax just sent and will await to see if there is any issues on [place] end, if fax not received.  Pt made aware of this plan and agrees with this.

## 2021-02-24 NOTE — Telephone Encounter (Incomplete)
Patient is saying [facility] to didn't get the fax patient is asking to have the fax refax to them. Please advise

## 2021-02-24 NOTE — Telephone Encounter (Signed)
Talked to pt who is asking for [place] to arrange for her BMET to be drawn via her porta cath at the [facility].  Informed the pt that we cannot call the cancer center to have this arranged, she will need to go back to [facility] where she was at, and where Dr. Burt Knack advised her to go to, to have this lab drawn (pt given between 6/15-6/24 to do this).  Pt states they already left there.  Advised the pt to go back to [facility] lab, for we faxed over the order at fax number [phone], as she requested.  Pt did have a copy of lab order that was written by Dr. Burt Knack at last [place], and but forgot it at home, which is why she is having issues getting this lab done.  Pt can only be accessed via porta cath to get her lab done, and Dr. Burt Knack advised her to go there to have this done.  Pts husband then got on speaker phone and started yelling at triage, then hung up.   Off note: Lab order that was faxed to [facility] at [phone] and confirmation fax was received by them.

## 2021-02-25 ENCOUNTER — Ambulatory Visit (HOSPITAL_COMMUNITY): Payer: Medicare Other

## 2021-02-26 NOTE — Telephone Encounter (Signed)
Called patient to check on status of lab work.  She reports that she had labs drawn at [facility].  She wanted to know if she should continue on her medication. I advised her to continue to take medication.  Provider will review labs and if any changes are needed our office will reach out to her. She verbalizes understanding.

## 2021-03-01 ENCOUNTER — Encounter (HOSPITAL_COMMUNITY): Payer: Medicare Other | Admitting: Physical Therapy

## 2021-03-03 ENCOUNTER — Encounter (HOSPITAL_COMMUNITY): Payer: Medicare Other | Admitting: Physical Therapy

## 2021-03-05 ENCOUNTER — Encounter (HOSPITAL_COMMUNITY): Payer: Medicare Other

## 2021-03-09 ENCOUNTER — Encounter (HOSPITAL_COMMUNITY): Payer: Medicare Other

## 2021-03-11 ENCOUNTER — Encounter (HOSPITAL_COMMUNITY): Payer: Medicare Other | Admitting: Physical Therapy

## 2021-03-15 ENCOUNTER — Encounter (HOSPITAL_COMMUNITY): Payer: Medicare Other | Admitting: Physical Therapy

## 2021-03-17 ENCOUNTER — Encounter (HOSPITAL_COMMUNITY): Payer: Medicare Other

## 2021-03-19 ENCOUNTER — Encounter (HOSPITAL_COMMUNITY): Payer: Medicare Other

## 2021-03-22 ENCOUNTER — Encounter (HOSPITAL_COMMUNITY): Payer: Medicare Other | Admitting: Physical Therapy

## 2021-03-24 ENCOUNTER — Encounter (HOSPITAL_COMMUNITY): Payer: Medicare Other | Admitting: Physical Therapy

## 2021-03-26 ENCOUNTER — Encounter (HOSPITAL_COMMUNITY): Payer: Medicare Other

## 2021-03-29 ENCOUNTER — Telehealth: Payer: Self-pay | Admitting: Family Medicine

## 2021-03-29 ENCOUNTER — Other Ambulatory Visit: Payer: Self-pay | Admitting: Family Medicine

## 2021-03-29 NOTE — Telephone Encounter (Signed)
Patient called to follow up on refill request for levothyroxine (SYNTHROID) 150 MCG tablet [[id number]]   Rx changed by a different provider (Dr. Roslyn Smiling - oncologist) from 175mcg to 1104mcg (higher dose negatively impacts patient's skin); Rx sent by Denn[facility] was canceled.  Nothing further needed at this time; Rx refill for lower dose being handled by Dr. Roslyn Smiling.

## 2021-03-30 NOTE — Telephone Encounter (Signed)
PCP to be made aware.

## 2021-04-05 IMAGING — CR DG CHEST 2V
2 series · 2 of 2 positions shown · non-contrast
Comparison: March 07, 2018

CLINICAL DATA: Chest pain

EXAM:
CHEST - 2 VIEW

[chest pa]
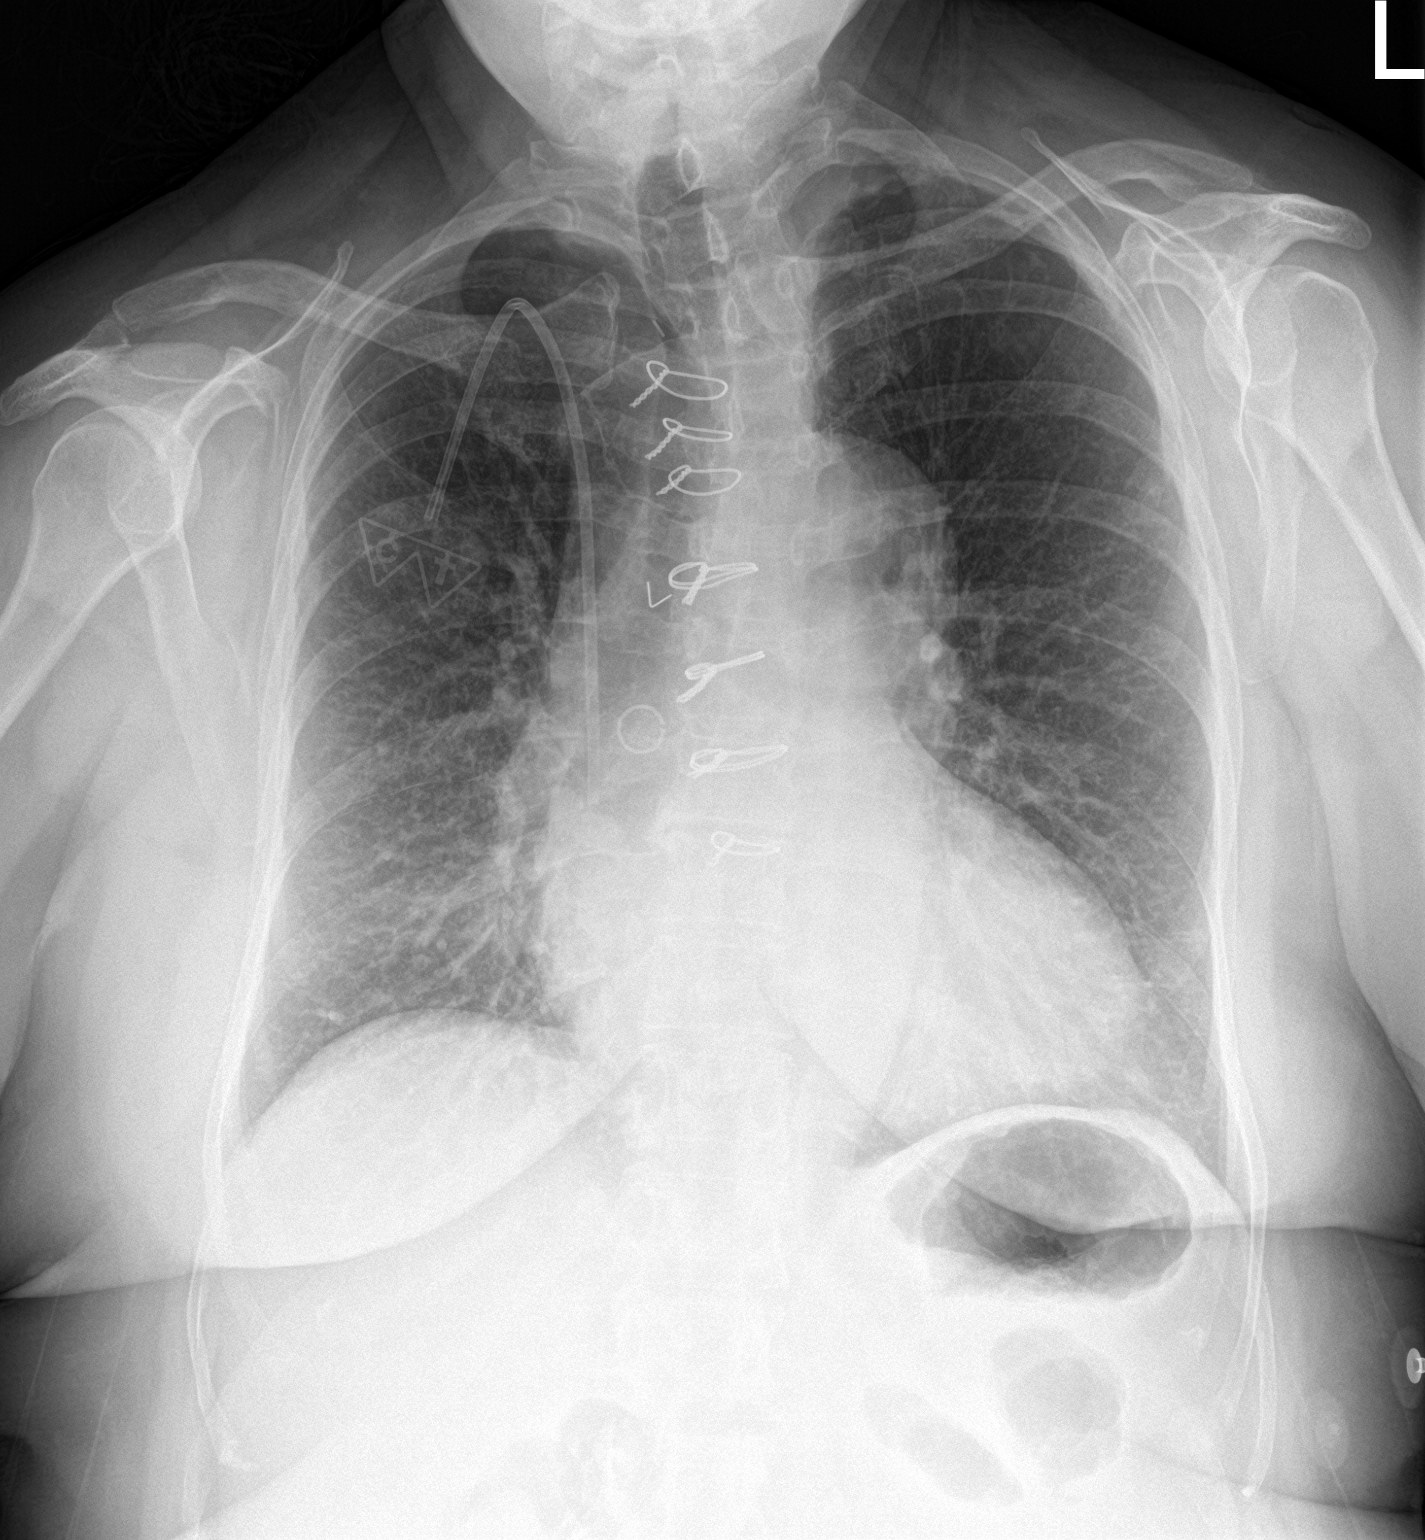

[chest lat]
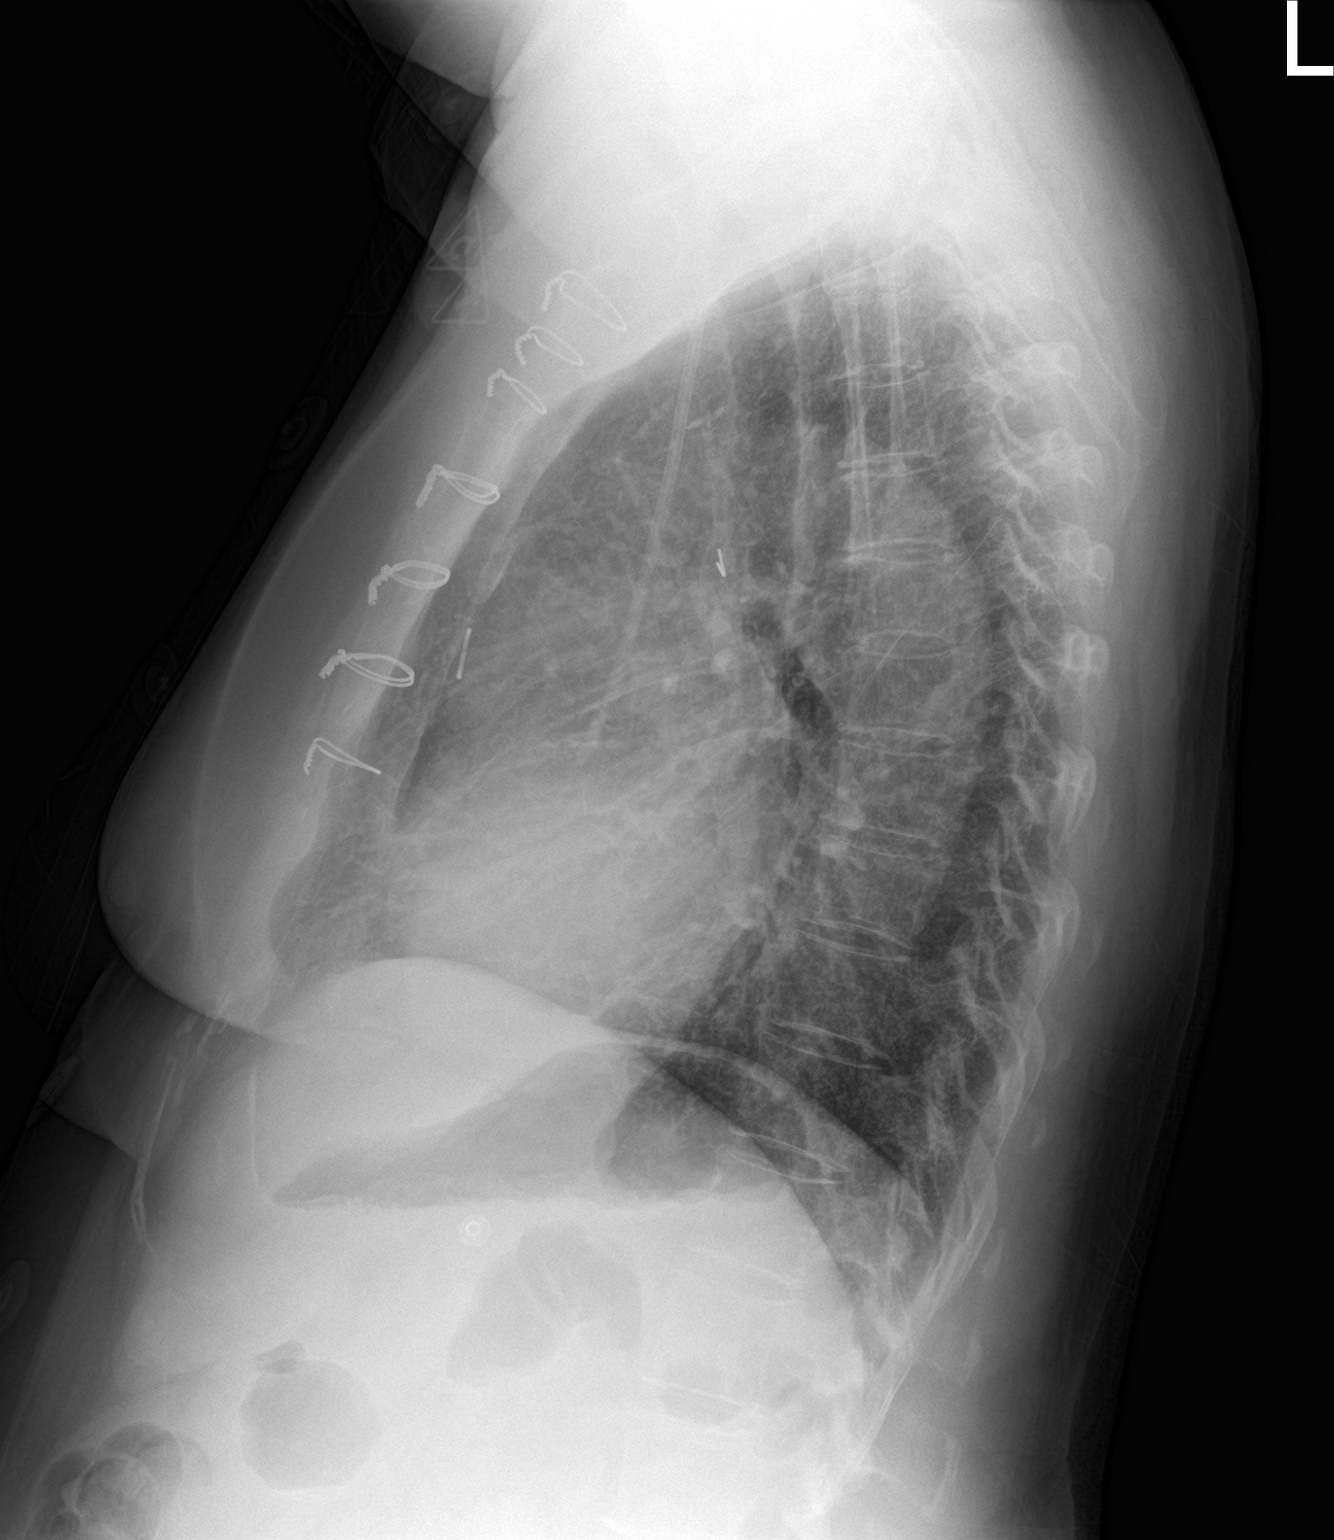

[2 of 2 positions shown; findings below may reference images not displayed]

FINDINGS: The heart size and mediastinal contours are mildly enlarged. Aortic
knob calcifications are seen. Overlying median sternotomy wires are
present. A right-sided MediPort catheter seen with the tip at the
superior cavoatrial junction. No airspace consolidation or pleural
effusion. No acute osseous abnormality.
IMPRESSION: No active cardiopulmonary disease.

## 2021-04-21 ENCOUNTER — Telehealth: Payer: Self-pay | Admitting: *Deleted

## 2021-04-21 ENCOUNTER — Other Ambulatory Visit: Payer: Self-pay | Admitting: Cardiovascular Disease

## 2021-04-21 NOTE — Telephone Encounter (Signed)
[place] [phone] is requesting isosorbide mononitrate (Imdur) 120 mg be changed to Imdur 60 mg ER tablets and patient take 2 tablets once a day to equal 120 mg. Patient can't swallow the 120 mg tablets due to size. Please advise of medication change. Thank you.

## 2021-04-21 NOTE — Telephone Encounter (Signed)
Called pharmacy and informed them that it is okay to switch from one Imdur 120 mg tablet to two Imdur 60 mg tablets.

## 2021-05-08 ENCOUNTER — Other Ambulatory Visit: Payer: Self-pay | Admitting: Cardiovascular Disease

## 2021-05-08 DIAGNOSIS — R609 Edema, unspecified: Secondary | ICD-10-CM

## 2021-05-08 DIAGNOSIS — R002 Palpitations: Secondary | ICD-10-CM

## 2021-05-08 DIAGNOSIS — I2510 Atherosclerotic heart disease of native coronary artery without angina pectoris: Secondary | ICD-10-CM

## 2021-05-11 ENCOUNTER — Ambulatory Visit: Payer: Medicare Other | Admitting: Family Medicine

## 2021-05-11 ENCOUNTER — Telehealth: Payer: Self-pay | Admitting: *Deleted

## 2021-05-11 ENCOUNTER — Encounter: Payer: Self-pay | Admitting: Family Medicine

## 2021-05-11 ENCOUNTER — Other Ambulatory Visit: Payer: Self-pay

## 2021-05-11 VITALS — BP 128/68 | HR 82 | Temp 98.1°F | Resp 16 | Ht 69.0 in | Wt 221.0 lb

## 2021-05-11 DIAGNOSIS — R319 Hematuria, unspecified: Secondary | ICD-10-CM

## 2021-05-11 DIAGNOSIS — E1165 Type 2 diabetes mellitus with hyperglycemia: Secondary | ICD-10-CM | POA: Diagnosis not present

## 2021-05-11 DIAGNOSIS — Z951 Presence of aortocoronary bypass graft: Secondary | ICD-10-CM | POA: Diagnosis not present

## 2021-05-11 DIAGNOSIS — I2510 Atherosclerotic heart disease of native coronary artery without angina pectoris: Secondary | ICD-10-CM | POA: Diagnosis not present

## 2021-05-11 LAB — URINALYSIS, ROUTINE W REFLEX MICROSCOPIC
Bilirubin Urine: NEGATIVE
Hgb urine dipstick: NEGATIVE
Ketones, ur: NEGATIVE
Leukocytes,Ua: NEGATIVE
Nitrite: NEGATIVE
Protein, ur: NEGATIVE
Specific Gravity, Urine: 1.01 (ref 1.001–1.035)
pH: 5.5 (ref 5.0–8.0)

## 2021-05-11 MED ORDER — GLIPIZIDE ER 5 MG PO TB24: 5.0000 mg | ORAL_TABLET | Freq: Every day | ORAL | 1 refills | Status: DC

## 2021-05-11 MED ORDER — BLOOD GLUCOSE SYSTEM PAK KIT: PACK | 1 refills | Status: AC

## 2021-05-11 MED ORDER — LANCETS MISC: 11 refills | Status: AC

## 2021-05-11 MED ORDER — BLOOD GLUCOSE TEST VI STRP: ORAL_STRIP | 11 refills | Status: AC

## 2021-05-11 NOTE — Addendum Note (Signed)
Addended by: Sheral Flow on: 05/11/2021 04:24 PM   Modules accepted: Orders

## 2021-05-11 NOTE — Telephone Encounter (Signed)
Chart notes from PCP on 05/11/2021: I did check a urinalysis today.  Apparently there was miscommunication.  The patient left after leaving the urine sample before I could see it.  The urine sample showed +3 glucose, no blood, no nitrates, no leukocyte esterase.  There is no evidence of urinary tract infection but there is evidence of uncontrolled diabetes and glucosuria.  Again I am very concerned that this patient has uncontrolled diabetes and she is not allowing [place] to manage it.  I would recommend that she get a glucometer and start checking her blood sugar fasting in the morning and 2 hours after supper.  I hope that this will be educational and teach her how bad her sugars are truly running.  Hopefully this will also emphasized to her the importance of allowing [place] to check the lab work so that we can manage it.  I suspect that her A1c will likely be much higher than 10.

## 2021-05-11 NOTE — Addendum Note (Signed)
Addended by: Jenna Luo T on: 05/11/2021 04:43 PM   Modules accepted: Orders

## 2021-05-11 NOTE — Telephone Encounter (Signed)
Patient returned to office to discuss results.   Glucometer and supplies sent to pharmacy.

## 2021-05-11 NOTE — Progress Notes (Addendum)
Subjective:    Patient ID: Sheri Hernandez, female    DOB: 09/01/1956, 65 y.o.   MRN: [id number]  HPI 10/13/20 Patient had CT scan at [facility] on February 1 as part of her follow-up for her metastatic renal cell carcinoma.  I have copied the relevant portions of the CT of the chest:  Lungs/Pleura: Biapical pleural-parenchymal scarring. Few scattered sub-4 mm nodules for example left upper lobe appear stable. No new or enlarging lung nodules. Moderate centrilobular emphysema. Faint new groundglass opacities in the anterior right upper lobe. Bibasilar atelectasis/scarring  Patient states that the pain is better.  She is no longer having the pleurisy on the right side.  She is no longer tender to palpation in the area diagrammed below with the purple dot.  Therefore the pain is resolved.  On the recent CAT scan, it appeared that the pneumonia was resolving.  She states that she still having coughing however the coughing is improving.  She questions if she needs additional antibiotics.  She denies any fever.  She denies any shortness of breath.  She denies any pleurisy.  There was already radiographic improvement on a CAT scan after only 1 week of therapy.  Therefore I believe the pneumonia is resolving and the further antibiotics are not indicated try to explain that to the patient today.  She is long overdue for diabetes lab work.  Her last A1c was 7.4 in February.  Her oncologist recommended following up with me to manage her diabetes which I would be happy to do however the patient adamantly refuses to allow me to draw any blood work today.  I try to explain to the patient that I am unable to manage her diabetes without checking her cholesterol or A1c her urine microalbumin.  However, patient is declining to allow me to do this and I cannot convince her otherwise.  At that time, my plan was:  Clinically the patient's pneumonia appears to be resolving.  The rib pain is totally subsided.  Therefore I do  not feel that further antibiotic is necessary.  I recommended symptomatic treatment with Hycodan 1 teaspoon every 6 hours as needed for cough.  To assuage the patient's fears I did give her a prescription for Levaquin but I explained that it can cause tendinitis and muscle issues and therefore I recommended not taking it unless she developed fevers or chills or worsening cough or worsening chest pain none of which she has today.  I tried to convince the patient to allow me to check an A1c and also a fasting lipid panel and to check a urine microalbumin to help manage her diabetes.  Her A1c is long overdue.  Patient refuses.  This is frustrating for me as I am unable to be her doctor if she will not allow me to check basic lab work.  However this is her choice and I explained to her that I cannot manage her diabetes without checking lab work.  She states that she will come up with another plan.  Lastly she has significant edema in both legs distal to the knees on exam today.  It is +2 and consistent with lymphedema.  She is not wearing any compression hose.  She is not taking her Lasix.  I recommended that she resume Lasix and start to wear compression hose to help manage the lymphedema.  05/11/21 I reviewed her recent office visit from her oncologist.  I have copied part of his assessment and plan below:  "  Diabetes Mellitus Type II: Diagnosed after HgA1c in 10/2017 was 6.7%. Recheck 05/17/2018 was 6.5. Repeat A1c in 10/2019 was 7.4. Recommended that patient follow up with PCP for recommendations. Remains hyperglycemic on labs today with stocking and glove tingling. I recommended she be started on treatment for diabetes and do not think this is a consequence of immune therapy."  Random blood sugar at that time was over 300.  As documented above, the patient refused to allow me to check lab work in February order to manage her diabetes.  She is here today for follow-up.  I do not have a recent A1c after February  2021.  At that time it was 7.4 however her random blood sugar recently was over 300.  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  Patient again refuses to allow me to check blood work today.  She only wants labs drawn from her port.  However our lab does not feel comfortable accessing her port.  Therefore I am not able to get blood work from the patient.  Therefore I gave the patient an ultimatum today.  She either has to go to the hospital lab or perhaps they will access the port or she needs to find a different physician.  I suspect that her diabetes is out of control.  She has too many risk factors that put her at to greater risk for future complications.  If I am unable to see lab work, I can no longer be her physician.  She states that she will go to [facility] and let them access report.  She does report a slight pink stain in her pad that she wears due to urinary incontinence.  She denies any dysuria.  She denies any frequency or urgency.    Past Medical History:  Diagnosis Date   Acute blood loss anemia 01/12/2015   Adenocarcinoma, renal cell ([place]) 11/12/2012   Overview:   09/25/12 - CXR - lung masses  CT CAP - rt hilar mass, mult pulm nodules, LLL mass 3 x 3.1 cm, RLL mass 2.2 x 2.0 cm, left kidney mass 12.4 x 11.1 x 10.3 cm  10/05/12 PET - left lung mass 30 mm SUV 16.7, rt lung nodule 21 mm, SUV 10.4, left kidney mass 7 cm SUV 18, mass compressing RLL bronchus  10/11/12 lung biopsy - atypical cells  10/23/12 left kidney biopsy - high gr clear cell RCC    Anxiety    Arthritis    "maybe in my right hand" (05/26/2017)   CAD (coronary artery disease)    a. 12/2014 Cath: LM nl, LAD 20p, 3m, D2 20ost, LCX 95p, 57m, RCA dominant, 95p, 60m/d, RPDA 95, RPL2 50, attempted RCA PCI w/ acute RCA occlusion-->CABG x 2 VG->RPDA->PLVB.   Chronic diastolic CHF (congestive heart failure) (HCC)    Drug-induced hypothyroidism 03/13/2014   GERD (gastroesophageal reflux disease) 03/18/2015   Hyperglycemia 01/12/2015    Hypertension    Hypoalbuminemia    Hypothyroidism    Lower extremity edema    Lung mass    LUNG MASSES--COUGH-BIOPSY NON-DIAGNOSTIC.  HX OF MEDIASTINAL MASS AND LUNG MASSES AGE 68 -NEGATIVE BIOSPY-BUT GIVEN DIAGNOSIS OF SARCOIDOSIS.  WORK UP CONTINUING ON DIAGNOSIS FOR PT'S LUNG MASSES--SHE HAS BIOPSY PROVEN KIDNEY CANCER   Mass of left breast 08/01/2011   Myocardial infarction ([place]) 12/2014 X 2   Pain    HX OF EPISODES OF BREIF PAIN BACK OF HEAD-RIGHT SIDE- OCCURS WHEN PT TURNS HER HEAD TO RIGHT--BUT DOESN'T HAPPEN EVERY TIME SHE TURNS HER  HEAD TO RIGHT--SHE HAS HAD ALL HER LIFE AND STATES PAST WORK UPS- NEGATIVE FOR ANY BRAIN ISSUES.   Papilloma of breast 2012   left. lumpectomy   Pulmonary nodules    Renal cell cancer (HCC)    metastatic renal cell carcinoma (biopsy of left renal mass 10/2012) On nivolumab at [facility]   Renal mass 10/01/2012   10/23/12 Bx Pos high grad clear cell renal cell ca> referred to oncology 10/26/2012     S/p nephrectomy    left, due to metastatic RCC, 3/14   Past Surgical History:  Procedure Laterality Date   BREAST BIOPSY  08/18/2011   Procedure: BREAST BIOPSY WITH NEEDLE LOCALIZATION;  Surgeon: Merrie Roof, MD;  Location: [place];  Service: General;  Laterality: Left;  left breast biopsy with needle localization   BRONCHIAL BRUSH BIOPSY  2014   CARDIAC CATHETERIZATION     CORONARY ARTERY BYPASS GRAFT N/A 01/02/2015   Procedure: CORONARY ARTERY BYPASS GRAFTING (CABG)TIMES TWO USING RIGHT GREATER SAPHENOUS LEG VEIN HARVESTED ENDOSCOPICALLY;  Surgeon: [name], MD;  Location: [place];  Service: Open Heart Surgery;  Laterality: N/A;   LAPAROSCOPIC NEPHRECTOMY Left 11/22/2012   Procedure: LAPAROSCOPIC NEPHRECTOMY;  Surgeon: Dutch Gray, MD;  [facility];  Service: Urology;  Laterality: Left;   LEFT HEART CATH AND CORS/GRAFTS ANGIOGRAPHY N/A 10/11/2016   Procedure: Left Heart Cath and Cors/Grafts Angiography;  Surgeon: Sherren Mocha, MD;  Location: [place] [facility];  Service: Cardiovascular;  Laterality: N/A;   LEFT HEART CATHETERIZATION WITH CORONARY ANGIOGRAM N/A 01/02/2015   Procedure: LEFT HEART CATHETERIZATION WITH CORONARY ANGIOGRAM;  Surgeon: Leonie Man, MD;  Location: [facility] CATH LAB;  Service: Cardiovascular;  Laterality: N/A;   NECK LESION BIOPSY Left 08/2016   PORTA CATH INSERTION Right ~ 2017   [place] BRONCHOSCOPY  10/11/2012   Procedure: VIDEO BRONCHOSCOPY WITH FLUORO;  Surgeon: Kathee Delton, MD;  [facility];  Service: Cardiopulmonary;  Laterality: Bilateral;   WIDS     WISDOM TOOTH EXTRACTION     "all 4"   Current Outpatient Medications on File Prior to Visit  Medication Sig Dispense Refill   acetaminophen (TYLENOL) 500 MG tablet Take 500-1,000 mg by mouth every 6 (six) hours as needed for mild pain or headache.     aspirin 81 MG EC tablet Take 1 tablet (81 mg total) by mouth daily.     atorvastatin (LIPITOR) 40 MG tablet TAKE 1 TABLET (40 MG TOTAL) BY MOUTH DAILY. 90 tablet 2   Cholecalciferol (VITAMIN D PO) Take 1 tablet by mouth daily.      Cholecalciferol (VITAMIN D-3) 25 MCG (1000 UT) CAPS Take 1,000 Units by mouth daily with breakfast.     clopidogrel (PLAVIX) 75 MG tablet Take 1 tablet (75 mg total) by mouth daily. 90 tablet 3   furosemide (LASIX) 40 MG tablet Take 1 tablet (40 mg total) by mouth daily. 90 tablet 3   HYDROcodone-acetaminophen (NORCO) 5-325 MG tablet Take 1 tablet by mouth every 6 (six) hours as needed for moderate pain. 30 tablet 0   isosorbide mononitrate (IMDUR) 60 MG 24 hr tablet Take 2 tablets (120 mg total) by mouth daily. 60 tablet 11   levothyroxine (SYNTHROID) 125 MCG tablet Take 125 mcg by mouth daily before breakfast.     lidocaine-prilocaine (EMLA) cream Apply 1 application topically See admin instructions. APPLY CREAM TO PORTACATH SITE APPROXIMATELY [place] AND COVER  losartan (COZAAR) 100 MG tablet Take 1 tablet (100 mg total) by mouth  daily. 90 tablet 3   meclizine (ANTIVERT) 25 MG tablet Take 1 tablet (25 mg total) by mouth 3 (three) times daily as needed for dizziness. 30 tablet 0   metoprolol tartrate (LOPRESSOR) 100 MG tablet Take 1 tablet (100 mg total) by mouth 2 (two) times daily. 180 tablet 3   nitroGLYCERIN (NITROSTAT) 0.4 MG SL tablet Place 1 tablet (0.4 mg total) under the tongue every 5 (five) minutes as needed for chest pain. 90 tablet 0   ondansetron (ZOFRAN ODT) 4 MG disintegrating tablet Take 1 tablet (4 mg total) by mouth every 8 (eight) hours as needed for nausea or vomiting. 20 tablet 0   oxycodone (OXY-IR) 5 MG capsule Take 5 mg by mouth daily as needed (severe pain). pain     No current facility-administered medications on file prior to visit.   Allergies  Allergen Reactions   Silver Dermatitis and Other (See Comments)    Please use Mepilex bandaging ("brown/tan, not silver")   Tape Other (See Comments)    PLEASE DO NOT USE "PLASTIC" TAPE!! 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   Tobacco Use   Smoking status: Former    Packs/day: 1.50    Years: 40.00    Pack years: 60.00    Types: Cigarettes    Quit date: 09/24/2012    Years since quitting: 8.6   Smokeless tobacco: Never  Vaping Use   Vaping Use: Never used  Substance and Sexual Activity   Alcohol use: Yes    Comment: 05/26/2017 "couple drinks/year"   Drug use: No   Sexual activity: Yes  Other Topics Concern   Not on file  Social History Narrative   Not on file   Social Determinants of Health   Financial Resource Strain: Not on file  Food Insecurity: Not on file  Transportation Needs: Not on file  Physical Activity: Not on file  Stress: Not on file  Social  Connections: Not on file  Intimate Partner Violence: Not on file     Review of Systems  All other systems reviewed and are negative.     Objective:   Physical Exam Vitals reviewed.  Constitutional:      General: She is not in acute distress.    Appearance: She is not ill-appearing or toxic-appearing.  Cardiovascular:     Rate and Rhythm: Normal rate and regular rhythm.     Pulses: Normal pulses.     Heart sounds: Normal heart sounds. No murmur heard. Pulmonary:     Effort: Pulmonary effort is normal. No respiratory distress.     Breath sounds: Normal breath sounds. No decreased air movement. No wheezing, rhonchi or rales.  Chest:     Chest wall: No tenderness.  Neurological:     Mental Status: She is alert.          Assessment & Plan:  S/P CABG x 2  Coronary artery disease involving native coronary artery of native heart without angina pectoris  Uncontrolled type 2 diabetes mellitus with hyperglycemia ([place]) - Plan: Hemoglobin A1c, Microalbumin, urine, COMPLETE METABOLIC PANEL WITH GFR, Lipid panel  Hematuria, unspecified type - Plan: Urinalysis, Routine w reflex microscopic If patient does not follow through and get lab work drawn at an outpatient lab center, I will can no longer be her physician.  Therefore I will discharge her from this practice with hope that she will follow-up with [facility].  I was very clear with the patient that I feel her diabetes is out of control.  However she continues to refuse to allow [place] to check labs unless we access her port which is not done in our facility.  Therefore I have placed an order in epic.  Patient will contact the hospital lab and see if they will access her port.  If not, I would recommend that she seek care at [facility] where she may be more comfortable with their care.    I did check a urinalysis today.  Apparently there was miscommunication.  The patient left after leaving the urine sample before I could see it.  The  urine sample showed +3 glucose, no blood, no nitrates, no leukocyte esterase.  There is no evidence of urinary tract infection but there is evidence of uncontrolled diabetes and glucosuria.  Again I am very concerned that this patient has uncontrolled diabetes and she is not allowing [place] to manage it.  I would recommend that she get a glucometer and start checking her blood sugar fasting in the morning and 2 hours after supper.  I hope that this will be educational and teach her how bad her sugars are truly running.  Hopefully this will also emphasize to her the importance of needing to manage her diabetes. Addendum: We were able to reach the patient and she came back.  I explained to her that her urine sample showed +3 glucose.  Once again, I emphasized the necessity of allowing [place] to check her lab work.  She again refused to allow [place] to do it here today.  I am going to start the patient on Glucotrol XL 5 mg p.o. every morning even without an A1c because I suspect that her A1c will be very high and I think it is only a matter of time before the patient suffers a negative consequence because of this.  She promises me that she will go to the hospital lab tomorrow to have the labs drawn so that I can get a better idea of how her sugars are running.  Also sent in a prescription for a glucometer and asked her to check her fasting blood sugars and 2-hour postprandial sugars every day.

## 2021-05-12 LAB — MICROALBUMIN, URINE: Microalb, Ur: 2.6 mg/dL

## 2021-05-13 ENCOUNTER — Telehealth: Payer: Self-pay | Admitting: Family Medicine

## 2021-05-13 NOTE — Telephone Encounter (Signed)
Call placed to patient.   Advised that orders will be given to [facility].   Call placed to [facility]. No answer, no VM.   Of note, patient reports that her CBG readings are as follows:  AM PM  7-Sep 324 226  8-Sep 260

## 2021-05-13 NOTE — Telephone Encounter (Signed)
Patient called to follow up on request from [facility]; requesting call from Dr. Dennard Schaumann regarding A1C test through porto cath. Please advise them at [phone].  Please advise patient at [phone].

## 2021-05-14 ENCOUNTER — Encounter: Payer: Self-pay | Admitting: *Deleted

## 2021-05-14 ENCOUNTER — Other Ambulatory Visit: Payer: Medicare Other

## 2021-05-14 MED ORDER — GLIPIZIDE ER 10 MG PO TB24: 10.0000 mg | ORAL_TABLET | Freq: Every day | ORAL | 3 refills | Status: DC

## 2021-05-14 NOTE — Telephone Encounter (Signed)
Call placed to [facility] and spoke to [place].   Reports that she is unsure if labs can be drawn if patient is not established at their facility. States that she will research and return call.

## 2021-05-14 NOTE — Telephone Encounter (Signed)
Received return call from [place].   Reports that [facility] is unable to take orders from outside providers. Therefore, she will not be able to have port accessed and labs drawn there.   Also states that patient has not been seen at [facility] >8 years.   Please advise.

## 2021-05-14 NOTE — Telephone Encounter (Signed)
Call placed to patient and patient made aware.   Agreeable to increasing Glipizide. Prescription sent to pharmacy.   States that she will ask Oncologist at [facility] if he will have labs drawn.   States that she will call with recommendations from oncology and [facility] next week.

## 2021-05-17 NOTE — Telephone Encounter (Signed)
Received call from [facility] [place] with [facility] [phone] telephone.   Reports that she will need orders from PCP to have labs drawn at facility.   Orders faxed to [phone].

## 2021-05-20 ENCOUNTER — Telehealth: Payer: Self-pay | Admitting: *Deleted

## 2021-05-20 NOTE — Telephone Encounter (Signed)
Received call from patient.   Reports that blood sugar readings are as follows:  AM NOON PM  7-Sep 324  226  8-Sep 260  234  9-Sep 242  339  10-Sep 232 270 200  11-Sep 232  296  12-Sep 222  182  13-Sep 177 112 137  14-Sep 144  137  15-Sep 184     Patient states that she is currently taking Glipizide 10mg  XR PO QD. Patient did report that she did take half a tab on 05/19/2021 because she was concerned her numbers would drop too low.   Also states that she did go to oncologist to have labs drawn from her port today, but they were unable to obtain any blood.   States that she can try again in (3) months.

## 2021-05-21 ENCOUNTER — Telehealth: Payer: Self-pay | Admitting: Family Medicine

## 2021-05-21 NOTE — Telephone Encounter (Signed)
Patient left voicemail message this morning regarding lab work she had to repeat this morning for a different provider. Placed outbound call to patient to verify message (voicemail was somewhat muffled). Patient stated she's already spoken with the nurse Christina and doesn't need a call back.

## 2021-05-21 NOTE — Telephone Encounter (Signed)
Call placed to patient and patient made aware.   Of note, patient states that she did return to oncology today and had labs obtained.

## 2021-05-24 ENCOUNTER — Telehealth: Payer: Self-pay | Admitting: Family Medicine

## 2021-05-24 NOTE — Telephone Encounter (Signed)
Left message for patient to call back and schedule Medicare Annual Wellness Visit (AWV) in office.  ° °If not able to come in office, please offer to do virtually or by telephone.  Left office number and my jabber #[phone]. ° °Due for AWVI ° °Please schedule at anytime with Nurse Health Advisor. °  °

## 2021-05-26 ENCOUNTER — Encounter: Payer: Self-pay | Admitting: *Deleted

## 2021-06-02 ENCOUNTER — Other Ambulatory Visit: Payer: Self-pay | Admitting: Family Medicine

## 2021-06-09 ENCOUNTER — Telehealth: Payer: Self-pay | Admitting: *Deleted

## 2021-06-09 NOTE — Telephone Encounter (Signed)
Received call from patient.   Reports that she did have labs drawn at oncology but has not heard from PCP about her readings. Noted labs are available in [place]. A1C noted at 11.2  States that she will also drop off her CBG log in AM.   Reports that she is averaging 125. Reports that she is continuing to take Glipizide 10mg  PO QD.

## 2021-06-10 NOTE — Telephone Encounter (Signed)
Awaiting blood sugar logs.

## 2021-06-10 NOTE — Telephone Encounter (Signed)
Routed logs to PCP.

## 2021-06-14 NOTE — Telephone Encounter (Signed)
Call placed to patient and patient made aware.   States that she would like to research medication prior to starting.   States that she will call back with her decision.

## 2021-07-15 ENCOUNTER — Telehealth: Payer: Self-pay | Admitting: Cardiovascular Disease

## 2021-07-15 NOTE — Telephone Encounter (Signed)
Spoke with the patient who states that she has had some chest pressure that comes and goes for the past week. She reports that she has been congested with a cough, running nose, sore throat and sneezing for about 10 days now. She states that she feels like she has a hairball stuck in her throat that she needs to cough up. She denies any shortness of breath. Sh reports blood pressure has been good. She saw her oncologist today and he advised that she call us due to chest tightness. She states that she does not feel the same as she did when she had prior heart attacks. Similar feeling to previous colds. She has not seen her PCP or taken any OTC medications for her symptoms. I have advised her to contact her PCP and take sound cough medicine.  Patient also states that she has a [employer] mobile and she has had about 4 incidences where she gets an abnormal rhythm. She reports one time it showed a wide QRS. She has had multiple episodes where it has showed tachycardia with heart rates around 113 bpm. Advised that this is just an elevated heart rate. Advised to check back on readings and let us know if it was showing anything different.  Will make Dr. Burt Knack aware and call back with any further recommendations.

## 2021-07-15 NOTE — Telephone Encounter (Signed)
I agree with your recommendations Carly. Thanks!

## 2021-07-15 NOTE — Telephone Encounter (Signed)
Pt c/o of Chest Pain: STAT if CP now or developed within 24 hours  1. Are you having CP right now? no  2. Are you experiencing any other symptoms (ex. SOB, nausea, vomiting, sweating)? Chest tightness, she does have a cold so she is wondering if it could be from that.  She saw her oncologist today and he suggested she call us. She said it feels like she has a hairball stuck in her throat.   3. How long have you been experiencing CP? About a week or two  4. Is your CP continuous or coming and going? Comes and goes   5. Have you taken Nitroglycerin? no ?

## 2021-07-26 ENCOUNTER — Telehealth: Payer: Self-pay

## 2021-07-26 DIAGNOSIS — E1165 Type 2 diabetes mellitus with hyperglycemia: Secondary | ICD-10-CM

## 2021-07-26 MED ORDER — DEXCOM G6 TRANSMITTER MISC: 1.0000 | 3 refills | Status: DC | PRN

## 2021-07-26 MED ORDER — DEXCOM G6 RECEIVER DEVI: 1.0000 | 1 refills | Status: DC

## 2021-07-26 MED ORDER — DEXCOM G6 SENSOR MISC: 1.0000 | 6 refills | Status: DC | PRN

## 2021-07-26 NOTE — Telephone Encounter (Signed)
Please advise. Thanks.  

## 2021-07-26 NOTE — Telephone Encounter (Signed)
Spoke with pt and she states Dexcom 6 is covered by her insurance. Rx sent to pharmacy as requested.

## 2021-07-26 NOTE — Telephone Encounter (Signed)
That is fine - we just got some samples of Dexcom. Please find out which one her insurance will cover and if it is one we have samples of, we can get her started today.

## 2021-07-26 NOTE — Telephone Encounter (Signed)
Pt called in stating that she has been sticking her fingers for over a month now to get her glucose readings, and pt states that this will not work for her, and would like to get a prescription for dexcon for her diabetes. Please advise.  Cb#: [phone]

## 2021-07-27 ENCOUNTER — Other Ambulatory Visit: Payer: Self-pay | Admitting: Family Medicine

## 2021-07-27 DIAGNOSIS — E1165 Type 2 diabetes mellitus with hyperglycemia: Secondary | ICD-10-CM

## 2021-07-27 MED ORDER — DEXCOM G6 TRANSMITTER MISC: 1.0000 | 3 refills | Status: DC | PRN

## 2021-07-27 MED ORDER — DEXCOM G6 RECEIVER DEVI: 1.0000 | 1 refills | Status: DC

## 2021-07-27 MED ORDER — DEXCOM G6 SENSOR MISC: 1.0000 | 6 refills | Status: DC | PRN

## 2021-07-27 NOTE — Addendum Note (Signed)
Addended by: Wadie Lessen on: 07/27/2021 12:13 PM   Modules accepted: Orders

## 2021-07-27 NOTE — Telephone Encounter (Signed)
Pt called to state Dexcom6 is not covered by her insurance.   Spoke with pt's pharmacy, [facility], and they advise Dexcom is covered under her [facility] DME benefits. They state rx would have to be sent to [place] for fulfillment. They provided information for [facility]: P- [phone] F [phone]  Spoke with [facility] and they need rx, pt info and recent OV notes. Rx's, demographics and LOV note faxed to [place].   Pt aware [facility] will contact her. Nothing further needed at this time.

## 2021-08-09 ENCOUNTER — Other Ambulatory Visit: Payer: Self-pay | Admitting: Family Medicine

## 2021-08-16 ENCOUNTER — Telehealth: Payer: Self-pay

## 2021-08-16 DIAGNOSIS — E1165 Type 2 diabetes mellitus with hyperglycemia: Secondary | ICD-10-CM

## 2021-08-16 NOTE — Telephone Encounter (Signed)
Pt called in stating that she needs some help in providing info needed to get a glucose monitor. Please advise.  Cb#: [phone]

## 2021-08-16 NOTE — Telephone Encounter (Signed)
Pt called to report she still has not received glucometer.  Advised pt all information was sent to [place] as recommended by her pharmacy. Pt states she has not heard from them.  Call placed to [facility], hold times are very long. WCB at a later time.

## 2021-08-17 NOTE — Telephone Encounter (Signed)
Patient called to follow up with nurse to see if [place] was contacted and to check on status of glucose monitor. Patient will contact [place] to follow up; requesting call back from nurse. Please advise at [phone].

## 2021-08-18 MED ORDER — FREESTYLE LIBRE 2 READER DEVI: 1.0000 | Freq: Once | 1 refills | Status: AC

## 2021-08-18 MED ORDER — DEXCOM G6 SENSOR MISC: 1.0000 | 6 refills | Status: DC | PRN

## 2021-08-18 MED ORDER — FREESTYLE LIBRE 2 SENSOR MISC: 1.0000 | 3 refills | Status: DC

## 2021-08-18 MED ORDER — DEXCOM G6 TRANSMITTER MISC: 1.0000 | 3 refills | Status: DC | PRN

## 2021-08-18 MED ORDER — DEXCOM G6 RECEIVER DEVI: 1.0000 | 1 refills | Status: DC

## 2021-08-18 NOTE — Telephone Encounter (Signed)
Spoke with patient.  She was advised that her insurance will not cover dexcom because she is not on insulin.  She request [facility]  This was done patient aware.

## 2021-08-18 NOTE — Telephone Encounter (Signed)
See other telephone encounter.  Patient was advised insurance will not cover dexcom and wishes to have [place] [facility].  This was done, patient aware.

## 2021-08-24 ENCOUNTER — Telehealth: Payer: Self-pay

## 2021-08-24 DIAGNOSIS — E1165 Type 2 diabetes mellitus with hyperglycemia: Secondary | ICD-10-CM

## 2021-08-24 MED ORDER — FREESTYLE LIBRE 3 SENSOR MISC: 1.0000 | 3 refills | Status: DC

## 2021-08-24 NOTE — Telephone Encounter (Signed)
Call from [facility], that patient needs freestyle libre 3 sent to pharmacy.  Order placed and patient aware.

## 2021-08-24 NOTE — Telephone Encounter (Signed)
Pt called in asking if prescription could be called [facility] to [facility] to change her diabetic glucose monitor from a 2 to a 3. Please advise.  Cb#: [phone]

## 2021-08-25 NOTE — Telephone Encounter (Signed)
This is a final attempt to get the patient her Libre 3.  Spoke with patient husband and we agree that the order needs to be sent to [facility].  All orders for dexcom sent to [facility] will be canceled.  Patient will now need to reach out to [facility] as order and information has been sent.  If this cannot be handled with this attempt patient may benefit from endocrinology referral.

## 2021-08-25 NOTE — Addendum Note (Signed)
Addended by: Amado Coe on: 08/25/2021 04:56 PM   Modules accepted: Orders

## 2021-08-27 ENCOUNTER — Telehealth: Payer: Self-pay

## 2021-08-27 DIAGNOSIS — E1165 Type 2 diabetes mellitus with hyperglycemia: Secondary | ICD-10-CM

## 2021-08-27 NOTE — Telephone Encounter (Signed)
Pt called in asking to get referral to [facility] an endocrinologist. Pt provided the phone number for this provider as [phone]. Please advise.  Cb#: [phone]

## 2021-08-27 NOTE — Telephone Encounter (Signed)
Referral placed.

## 2021-09-30 ENCOUNTER — Other Ambulatory Visit: Payer: Self-pay | Admitting: Cardiovascular Disease

## 2021-09-30 DIAGNOSIS — I25708 Atherosclerosis of coronary artery bypass graft(s), unspecified, with other forms of angina pectoris: Secondary | ICD-10-CM

## 2021-09-30 DIAGNOSIS — E785 Hyperlipidemia, unspecified: Secondary | ICD-10-CM

## 2021-11-10 ENCOUNTER — Telehealth: Payer: Self-pay | Admitting: Cardiovascular Disease

## 2021-11-10 NOTE — Telephone Encounter (Signed)
Patient asks that we send orders to [facility] to have a lipid panel done for Dr. Burt Knack. She has an appointment at [facility]. ? ?Please fax orders to [phone] ?

## 2021-11-10 NOTE — Telephone Encounter (Signed)
Spoke with pt and advised that she just had Lipids checked in Sept and they looked great.  Advised they typically only need to be checked yearly or with medication changes.  Also mentioned that Dr. Dennard Schaumann normally draws her labs for her.  Pt states he will no longer be doing that.  Pt states she normally gets labs drawn through her port a cath every 4 months at [facility] and was just trying to get ahead of the game.  Advised pt we could certainly look at ordering those for her next lab drawn at [place] if Dr. Burt Knack felt appropriate.  Pt verbalized understanding and was appreciative for call.  ?

## 2021-11-25 ENCOUNTER — Ambulatory Visit: Payer: Medicare Other | Admitting: Cardiovascular Disease

## 2021-12-01 ENCOUNTER — Ambulatory Visit: Payer: Medicare Other | Admitting: Cardiovascular Disease

## 2021-12-01 ENCOUNTER — Encounter: Payer: Self-pay | Admitting: Cardiovascular Disease

## 2021-12-01 VITALS — BP 150/92 | HR 74 | Ht 68.0 in | Wt 213.0 lb

## 2021-12-01 DIAGNOSIS — I25119 Atherosclerotic heart disease of native coronary artery with unspecified angina pectoris: Secondary | ICD-10-CM

## 2021-12-01 DIAGNOSIS — E782 Mixed hyperlipidemia: Secondary | ICD-10-CM | POA: Diagnosis not present

## 2021-12-01 DIAGNOSIS — R011 Cardiac murmur, unspecified: Secondary | ICD-10-CM

## 2021-12-01 DIAGNOSIS — R002 Palpitations: Secondary | ICD-10-CM

## 2021-12-01 DIAGNOSIS — I10 Essential (primary) hypertension: Secondary | ICD-10-CM

## 2021-12-01 NOTE — Patient Instructions (Signed)
Medication Instructions:  ?Your physician recommends that you continue on your current medications as directed. Please refer to the Current Medication list given to you today. ? ?*If you need a refill on your cardiac medications before your next appointment, please call your pharmacy* ? ? ?Lab Work: ?NONE ?If you have labs (blood work) drawn today and your tests are completely normal, you will receive your results only by: ?MyChart Message (if you have MyChart) OR ?A paper copy in the mail ?If you have any lab test that is abnormal or we need to change your treatment, we will call you to review the results. ? ? ?Testing/Procedures: ?ECHO in 6 months ?Your physician has requested that you have an echocardiogram. Echocardiography is a painless test that uses sound waves to create images of your heart. It provides your doctor with information about the size and shape of your heart and how well your heart?s chambers and valves are working. This procedure takes approximately one hour. There are no restrictions for this procedure. ?Follow-Up: ?At [facility], you and your health needs are our priority.  As part of our continuing mission to provide you with exceptional heart care, we have created designated Provider Care Teams.  These Care Teams include your primary Cardiologist (physician) and Advanced Practice Providers (APPs -  Physician Assistants and Nurse Practitioners) who all work together to provide you with the care you need, when you need it. ? ?Your next appointment:   ?6 month(s) ? ?The format for your next appointment:   ?In Person ? ?Provider:   ?Sherren Mocha, MD   ? ?**Please monitor BP at home and let us know if pressure is running greater than 135/85** ?  ?

## 2021-12-01 NOTE — Progress Notes (Signed)
?Cardiology Office Note:   ? ?Date:  12/01/2021  ? ?ID:  Sheri Hernandez, DOB 1955-10-31, MRN [id number] ? ?PCP:  Susy Frizzle, MD ?  ?[facility] ?Cardiologist:  Sherren Mocha, MD    ? ?Referring MD: Susy Frizzle, MD  ? ?Chief Complaint  ?Patient presents with  ? Coronary Artery Disease  ? ? ?History of Present Illness:   ? ?Sheri Hernandez is a 66 y.o. female with a hx of CAD with chronic angina, returning for follow-up evaluation.  ?  ?The patient has a complicated history, initially presenting with non-STEMI in 2016 and undergoing complex PCI of severe stenosis in the RCA complicated by extensive dissection requiring emergency CABG with the saphenous vein graft to PDA and PLA.  The patient has had recurrent angina and follow-up catheterization demonstrated occlusion of her bypass graft.  The RCA remained patent with tight residual stenosis and medical therapy was recommended in the setting of controlled symptoms.  Comorbid conditions include history of metastatic renal cell carcinoma status post left nephrectomy, breast cancer status post left lumpectomy, hypertension, and mixed hyperlipidemia. ? ?The patient is here alone today.  She has been doing pretty well from a cardiac perspective.  States that her angina has really decreased over the last 6 months.  She is not taking much nitroglycerin anymore.  She does not know what to attribute this to, but she is pleased that she is not having much chest pain at this point.  No shortness of breath, orthopnea, or PND.  She has some concerns about readings on her Kardia mobile and I reviewed these with her today.  She denies lightheadedness or syncope.  She has chronic leg swelling which has been improved. ? ?Past Medical History:  ?Diagnosis Date  ? Acute blood loss anemia 01/12/2015  ? Adenocarcinoma, renal cell ([place]) 11/12/2012  ? Overview:  ? 09/25/12 - CXR - lung masses ? CT CAP - rt hilar mass, mult pulm nodules, LLL mass 3 x 3.1 cm, RLL mass  2.2 x 2.0 cm, left kidney mass 12.4 x 11.1 x 10.3 cm ? 10/05/12 PET - left lung mass 30 mm SUV 16.7, rt lung nodule 21 mm, SUV 10.4, left kidney mass 7 cm SUV 18, mass compressing RLL bronchus ? 10/11/12 lung biopsy - atypical cells ? 10/23/12 left kidney biopsy - high gr clear cell RCC   ? Anxiety   ? Arthritis   ? "maybe in my right hand" (05/26/2017)  ? CAD (coronary artery disease)   ? a. 12/2014 Cath: LM nl, LAD 20p, 49m D2 20ost, LCX 95p, 897mRCA dominant, 95p, 8051m RPDA 95, RPL2 50, attempted RCA PCI w/ acute RCA occlusion-->CABG x 2 VG->RPDA->PLVB.  ? Chronic diastolic CHF (congestive heart failure) ([facility] ? Drug-induced hypothyroidism 03/13/2014  ? GERD (gastroesophageal reflux disease) 03/18/2015  ? Hyperglycemia 01/12/2015  ? Hypertension   ? Hypoalbuminemia   ? Hypothyroidism   ? Lower extremity edema   ? Lung mass   ? LUNG MASSES--COUGH-BIOPSY NON-DIAGNOSTIC.  HX OF MEDIASTINAL MASS AND LUNG MASSES AGE 14 -NEGATIVE BIOSPY-BUT GIVEN DIAGNOSIS OF SARCOIDOSIS.  WORK UP CONTINUING ON DIAGNOSIS FOR PT'S LUNG MASSES--SHE HAS BIOPSY PROVEN KIDNEY CANCER  ? Mass of left breast 08/01/2011  ? Myocardial infarction ([facility]/2016 X 2  ? Pain   ? HX OF EPISODES OF BREIF PAIN BACK OF HEAD-RIGHT SIDE- OCCURS WHEN PT TURNS HER HEAD TO RIGHT--BUT DOESN'T HAPPEN EVERY TIME SHE TURNS HER HEAD TO RIGHT--SHE HAS  HAD ALL HER LIFE AND STATES PAST WORK [employer]- NEGATIVE FOR ANY BRAIN ISSUES.  ? Papilloma of breast 2012  ? left. lumpectomy  ? Pulmonary nodules   ? Renal cell cancer ([place])   ? metastatic renal cell carcinoma (biopsy of left renal mass 10/2012) On nivolumab at [facility]  ? Renal mass 10/01/2012  ? 10/23/12 Bx Pos high grad clear cell renal cell ca> referred to oncology 10/26/2012    ? S/p nephrectomy   ? left, due to metastatic RCC, 3/14  ? ? ?Past Surgical History:  ?Procedure Laterality Date  ? BREAST BIOPSY  08/18/2011  ? Procedure: BREAST BIOPSY WITH NEEDLE LOCALIZATION;  Surgeon: Merrie Roof, MD;  Location: [place];   Service: General;  Laterality: Left;  left breast biopsy with needle localization  ? BRONCHIAL BRUSH BIOPSY  2014  ? CARDIAC CATHETERIZATION    ? CORONARY ARTERY BYPASS GRAFT N/A 01/02/2015  ? Procedure: CORONARY ARTERY BYPASS GRAFTING (CABG)TIMES TWO USING RIGHT GREATER SAPHENOUS LEG VEIN HARVESTED ENDOSCOPICALLY;  Surgeon: [name], MD;  Location: [place];  Service: Open Heart Surgery;  Laterality: N/A;  ? LAPAROSCOPIC NEPHRECTOMY Left 11/22/2012  ? Procedure: LAPAROSCOPIC NEPHRECTOMY;  Surgeon: Dutch Gray, MD;  Location: [facility];  Service: Urology;  Laterality: Left;  ? LEFT HEART CATH AND CORS/GRAFTS ANGIOGRAPHY N/A 10/11/2016  ? Procedure: Left Heart Cath and Cors/Grafts Angiography;  Surgeon: Sherren Mocha, MD;  Location: [place] [facility];  Service: Cardiovascular;  Laterality: N/A;  ? LEFT HEART CATHETERIZATION WITH CORONARY ANGIOGRAM N/A 01/02/2015  ? Procedure: LEFT HEART CATHETERIZATION WITH CORONARY ANGIOGRAM;  Surgeon: Leonie Man, MD;  Location: [facility];  Service: Cardiovascular;  Laterality: N/A;  ? NECK LESION BIOPSY Left 08/2016  ? PORTA CATH INSERTION Right ~ 2017  ? TONSILLECTOMY  1967  ? VIDEO BRONCHOSCOPY  10/11/2012  ? Procedure: VIDEO BRONCHOSCOPY WITH FLUORO;  Surgeon: Kathee Delton, MD;  Location: [facility];  Service: Cardiopulmonary;  Laterality: Bilateral;  ? WIDS    ? WISDOM TOOTH EXTRACTION    ? "all 4"  ? ? ?Current Medications: ?Current Meds  ?Medication Sig  ? acetaminophen (TYLENOL) 500 MG tablet Take 500-1,000 mg by mouth every 6 (six) hours as needed for mild pain or headache.  ? aspirin 81 MG EC tablet Take 1 tablet (81 mg total) by mouth daily.  ? atorvastatin (LIPITOR) 40 MG tablet TAKE 1 TABLET (40 MG TOTAL) BY MOUTH DAILY.  ? Blood Glucose Monitoring Suppl (BLOOD GLUCOSE SYSTEM PAK) KIT Please dispense based on patient and insurance preference. Use as directed to monitor FSBS 4x daily due to uncontrolled diabetes. Dx: E11.65  ? Cholecalciferol (VITAMIN D-3) 25 MCG  (1000 UT) CAPS Take 1,000 Units by mouth daily with breakfast.  ? clopidogrel (PLAVIX) 75 MG tablet Take 1 tablet (75 mg total) by mouth daily.  ? Continuous Blood Gluc Receiver (FREESTYLE LIBRE 2 READER) DEVI   ? Continuous Blood Gluc Sensor (FREESTYLE LIBRE 3 SENSOR) MISC SMARTSIG:1 Topical Every 2 Weeks  ? Continuous Blood Gluc Transmit (DEXCOM G6 TRANSMITTER) MISC USE AS DIRECTED REPLACE EVERY 3 MONTHS  ? Continuous Blood Gluc Transmit (DEXCOM G6 TRANSMITTER) MISC 1 each by Does not apply route as needed (replace q56m.  ? glipiZIDE (GLUCOTROL XL) 10 MG 24 hr tablet TAKE 1 TABLET (10 MG TOTAL) BY MOUTH DAILY WITH BREAKFAST.  ? Glucose Blood (BLOOD GLUCOSE TEST STRIPS) STRP Please dispense based on patient and insurance preference. Use as directed to monitor FSBS 4x  daily due to uncontrolled diabetes. Dx: E11.65  ? HYDROcodone-acetaminophen (NORCO) 5-325 MG tablet Take 1 tablet by mouth every 6 (six) hours as needed for moderate pain.  ? Lancets MISC Please dispense based on patient and insurance preference. Use as directed to monitor FSBS 4x daily due to uncontrolled diabetes. Dx: E11.65  ? levothyroxine (SYNTHROID) 125 MCG tablet Take 125 mcg by mouth daily before breakfast.  ? levothyroxine (SYNTHROID) 125 MCG tablet Take 1 tablet by mouth daily.  ? lidocaine-prilocaine (EMLA) cream Apply 1 application topically See admin instructions. APPLY CREAM TO PORTACATH SITE APPROXIMATELY 1 HOUR BEFORE ACCESS AND COVER  ? losartan (COZAAR) 100 MG tablet Take 1 tablet (100 mg total) by mouth daily.  ? metoprolol tartrate (LOPRESSOR) 100 MG tablet Take 1 tablet (100 mg total) by mouth 2 (two) times daily.  ? nitroGLYCERIN (NITROSTAT) 0.4 MG SL tablet PLACE 1 TABLET UNDER THE TONGUE EVERY 5 MINUTES AS NEEDED FOR CHEST PAIN.  ? ondansetron (ZOFRAN ODT) 4 MG disintegrating tablet Take 1 tablet (4 mg total) by mouth every 8 (eight) hours as needed for nausea or vomiting.  ?  ? ?Allergies:   Silver and Tape  ? ?Social History   ? ?Socioeconomic History  ? Marital status: Married  ?  Spouse name: Not on file  ? Number of children: 0  ? Years of education: Not on file  ? Highest education level: Not on file  ?Occupational History

## 2021-12-29 ENCOUNTER — Other Ambulatory Visit: Payer: Self-pay | Admitting: Family Medicine

## 2021-12-29 DIAGNOSIS — Z1231 Encounter for screening mammogram for malignant neoplasm of breast: Secondary | ICD-10-CM

## 2022-01-13 ENCOUNTER — Ambulatory Visit
Admission: RE | Admit: 2022-01-13 | Discharge: 2022-01-13 | Disposition: A | Discharge status: Home / Self Care | Payer: Medicare Other | Source: Ambulatory Visit | Attending: Family Medicine | Admitting: Family Medicine

## 2022-01-13 DIAGNOSIS — Z1231 Encounter for screening mammogram for malignant neoplasm of breast: Secondary | ICD-10-CM

## 2022-01-29 ENCOUNTER — Other Ambulatory Visit: Payer: Self-pay | Admitting: Cardiovascular Disease

## 2022-01-29 DIAGNOSIS — R002 Palpitations: Secondary | ICD-10-CM

## 2022-01-29 DIAGNOSIS — R609 Edema, unspecified: Secondary | ICD-10-CM

## 2022-01-29 DIAGNOSIS — I2510 Atherosclerotic heart disease of native coronary artery without angina pectoris: Secondary | ICD-10-CM

## 2022-02-13 ENCOUNTER — Other Ambulatory Visit: Payer: Self-pay | Admitting: Cardiovascular Disease

## 2022-03-03 NOTE — Patient Instructions (Signed)
Sheri Hernandez , Thank you for taking time to come for your Medicare Wellness Visit. I appreciate your ongoing commitment to your health goals. Please review the following plan we discussed and let me know if I can assist you in the future.   Screening recommendations/referrals: Colonoscopy: Cologuard order placed today.  Mammogram: Done 01/13/2022 Repeat annually  Bone Density: Due at age 64.  Recommended yearly ophthalmology/optometry visit for glaucoma screening and checkup Recommended yearly dental visit for hygiene and checkup  Vaccinations: Influenza vaccine: Due Fall 2023. Pneumococcal vaccine: Due after age 54. Tdap vaccine: Due every 10 years. Shingles vaccine: Available at your local pharmacy.   Covid-19: Done 01/26/2021 and 12/22/2020  Advanced directives: Advance directive discussed with you today. Even though you declined this today, please call our office should you change your mind, and we can give you the proper paperwork for you to fill out.   Conditions/risks identified: Aim for 30 minutes of exercise or brisk walking, 6-8 glasses of water, and 5 servings of fruits and vegetables each day.  Next appointment: Follow up in one year for your annual wellness visit. 2024.  Preventive Care 40-64 Years, Female Preventive care refers to lifestyle choices and visits with your health care provider that can promote health and wellness. What does preventive care include? A yearly physical exam. This is also called an annual well check. Dental exams once or twice a year. Routine eye exams. Ask your health care provider how often you should have your eyes checked. Personal lifestyle choices, including: Daily care of your teeth and gums. Regular physical activity. Eating a healthy diet. Avoiding tobacco and drug use. Limiting alcohol use. Practicing safe sex. Taking low-dose aspirin daily starting at age 87. Taking vitamin and mineral supplements as recommended by your health care  provider. What happens during an annual well check? The services and screenings done by your health care provider during your annual well check will depend on your age, overall health, lifestyle risk factors, and family history of disease. Counseling  Your health care provider may ask you questions about your: Alcohol use. Tobacco use. Drug use. Emotional well-being. Home and relationship well-being. Sexual activity. Eating habits. Work and work Statistician. Method of birth control. Menstrual cycle. Pregnancy history. Screening  You may have the following tests or measurements: Height, weight, and BMI. Blood pressure. Lipid and cholesterol levels. These may be checked every 5 years, or more frequently if you are over 72 years old. Skin check. Lung cancer screening. You may have this screening every year starting at age 32 if you have a 30-pack-year history of smoking and currently smoke or have quit within the past 15 years. Fecal occult blood test (FOBT) of the stool. You may have this test every year starting at age 77. Flexible sigmoidoscopy or colonoscopy. You may have a sigmoidoscopy every 5 years or a colonoscopy every 10 years starting at age 74. Hepatitis C blood test. Hepatitis B blood test. Sexually transmitted disease (STD) testing. Diabetes screening. This is done by checking your blood sugar (glucose) after you have not eaten for a while (fasting). You may have this done every 1-3 years. Mammogram. This may be done every 1-2 years. Talk to your health care provider about when you should start having regular mammograms. This may depend on whether you have a family history of breast cancer. BRCA-related cancer screening. This may be done if you have a family history of breast, ovarian, tubal, or peritoneal cancers. Pelvic exam and Pap test. This  may be done every 3 years starting at age 37. Starting at age 21, this may be done every 5 years if you have a Pap test in  combination with an HPV test. Bone density scan. This is done to screen for osteoporosis. You may have this scan if you are at high risk for osteoporosis. Discuss your test results, treatment options, and if necessary, the need for more tests with your health care provider. Vaccines  Your health care provider may recommend certain vaccines, such as: Influenza vaccine. This is recommended every year. Tetanus, diphtheria, and acellular pertussis (Tdap, Td) vaccine. You may need a Td booster every 10 years. Zoster vaccine. You may need this after age 8. Pneumococcal 13-valent conjugate (PCV13) vaccine. You may need this if you have certain conditions and were not previously vaccinated. Pneumococcal polysaccharide (PPSV23) vaccine. You may need one or two doses if you smoke cigarettes or if you have certain conditions. Talk to your health care provider about which screenings and vaccines you need and how often you need them. This information is not intended to replace advice given to you by your health care provider. Make sure you discuss any questions you have with your health care provider. Document Released: 09/18/2015 Document Revised: 05/11/2016 Document Reviewed: 06/23/2015 Elsevier Interactive Patient Education  2017 [place] Prevention in the Home Falls can cause injuries. They can happen to people of all ages. There are many things you can do to make your home safe and to help prevent falls. What can I do on the outside of my home? Regularly fix the edges of walkways and driveways and fix any cracks. Remove anything that might make you trip as you walk through a door, such as a raised step or threshold. Trim any bushes or trees on the path to your home. Use bright outdoor lighting. Clear any walking paths of anything that might make someone trip, such as rocks or tools. Regularly check to see if handrails are loose or broken. Make sure that both sides of any steps have  handrails. Any raised decks and porches should have guardrails on the edges. Have any leaves, snow, or ice cleared regularly. Use sand or salt on walking paths during winter. Clean up any spills in your garage right away. This includes oil or grease spills. What can I do in the bathroom? Use night lights. Install grab bars by the toilet and in the tub and shower. Do not use towel bars as grab bars. Use non-skid mats or decals in the tub or shower. If you need to sit down in the shower, use a plastic, non-slip stool. Keep the floor dry. Clean up any water that spills on the floor as soon as it happens. Remove soap buildup in the tub or shower regularly. Attach bath mats securely with double-sided non-slip rug tape. Do not have throw rugs and other things on the floor that can make you trip. What can I do in the bedroom? Use night lights. Make sure that you have a light by your bed that is easy to reach. Do not use any sheets or blankets that are too big for your bed. They should not hang down onto the floor. Have a firm chair that has side arms. You can use this for support while you get dressed. Do not have throw rugs and other things on the floor that can make you trip. What can I do in the kitchen? Clean up any spills right away.  Avoid walking on wet floors. Keep items that you use a lot in easy-to-reach places. If you need to reach something above you, use a strong step stool that has a grab bar. Keep electrical cords out of the way. Do not use floor polish or wax that makes floors slippery. If you must use wax, use non-skid floor wax. Do not have throw rugs and other things on the floor that can make you trip. What can I do with my stairs? Do not leave any items on the stairs. Make sure that there are handrails on both sides of the stairs and use them. Fix handrails that are broken or loose. Make sure that handrails are as long as the stairways. Check any carpeting to make sure  that it is firmly attached to the stairs. Fix any carpet that is loose or worn. Avoid having throw rugs at the top or bottom of the stairs. If you do have throw rugs, attach them to the floor with carpet tape. Make sure that you have a light switch at the top of the stairs and the bottom of the stairs. If you do not have them, ask someone to add them for you. What else can I do to help prevent falls? Wear shoes that: Do not have high heels. Have rubber bottoms. Are comfortable and fit you well. Are closed at the toe. Do not wear sandals. If you use a stepladder: Make sure that it is fully opened. Do not climb a closed stepladder. Make sure that both sides of the stepladder are locked into place. Ask someone to hold it for you, if possible. Clearly mark and make sure that you can see: Any grab bars or handrails. First and last steps. Where the edge of each step is. Use tools that help you move around (mobility aids) if they are needed. These include: Canes. Walkers. Scooters. Crutches. Turn on the lights when you go into a dark area. Replace any light bulbs as soon as they burn out. Set up your furniture so you have a clear path. Avoid moving your furniture around. If any of your floors are uneven, fix them. If there are any pets around you, be aware of where they are. Review your medicines with your doctor. Some medicines can make you feel dizzy. This can increase your chance of falling. Ask your doctor what other things that you can do to help prevent falls. This information is not intended to replace advice given to you by your health care provider. Make sure you discuss any questions you have with your health care provider. Document Released: 06/18/2009 Document Revised: 01/28/2016 Document Reviewed: 09/26/2014 Elsevier Interactive Patient Education  2017 [employer].

## 2022-03-04 ENCOUNTER — Ambulatory Visit (INDEPENDENT_AMBULATORY_CARE_PROVIDER_SITE_OTHER): Payer: Medicare Other

## 2022-03-04 VITALS — Ht 68.0 in | Wt 211.0 lb

## 2022-03-04 DIAGNOSIS — Z0000 Encounter for general adult medical examination without abnormal findings: Secondary | ICD-10-CM

## 2022-03-04 DIAGNOSIS — Z1211 Encounter for screening for malignant neoplasm of colon: Secondary | ICD-10-CM

## 2022-03-04 NOTE — Progress Notes (Signed)
Subjective:   Sheri Hernandez is a 66 y.o. female who presents for an Initial Medicare Annual Wellness Visit. Virtual Visit via Telephone Note  I connected with  TIAH HECKEL on 03/04/22 at 11:30 AM EDT by telephone and verified that I am speaking with the correct person using two identifiers.  Location: Patient: HOME Provider: [facility] Persons participating in the virtual visit: patient/Nurse Health Advisor   I discussed the limitations, risks, security and privacy concerns of performing an evaluation and management service by telephone and the availability of in person appointments. The patient expressed understanding and agreed to proceed.  Interactive audio and video telecommunications were attempted between this nurse and patient, however failed, due to patient having technical difficulties OR patient did not have access to video capability.  We continued and completed visit with audio only.  Some vital signs may be absent or patient reported.   Chriss Driver, LPN  Review of Systems     Cardiac Risk Factors include: advanced age (>105mn, >>20women);hypertension;diabetes mellitus;dyslipidemia;sedentary lifestyle;obesity (BMI >30kg/m2)     Objective:    Today's Vitals   03/04/22 1111  Weight: 211 lb (95.7 kg)  Height: '5\' 8"'  (1.727 m)   Body mass index is 32.08 kg/m.     03/04/2022   11:17 AM 02/23/2021    2:46 PM 02/23/2021    2:45 PM 05/26/2017    7:58 PM 10/10/2016    9:37 AM 01/02/2015   11:32 AM 11/22/2012   11:31 AM  Advanced Directives  Does Patient Have a Medical Advance Directive? No No No Yes Yes Yes Patient has advance directive, copy not in chart  Type of Advance Directive    [place] Does patient want to make changes to medical advance directive?    No - Patient declined No - Patient declined No - Patient declined   Copy of  [place] Chart?    No - copy requested No - copy requested No - copy requested Copy requested from family  Would patient like information on creating a medical advance directive? No - Patient declined No - Patient declined  No - Patient declined     Pre-existing out of facility DNR order (yellow form or pink MOST form)       No    Current Medications (verified) Outpatient Encounter Medications as of 03/04/2022  Medication Sig   acetaminophen (TYLENOL) 500 MG tablet Take 500-1,000 mg by mouth every 6 (six) hours as needed for mild pain or headache.   aspirin 81 MG EC tablet Take 1 tablet (81 mg total) by mouth daily.   atorvastatin (LIPITOR) 40 MG tablet TAKE 1 TABLET BY MOUTH EVERY DAY   Blood Glucose Monitoring Suppl (BLOOD GLUCOSE SYSTEM PAK) KIT Please dispense based on patient and insurance preference. Use as directed to monitor FSBS 4x daily due to uncontrolled diabetes. Dx: E11.65   Cholecalciferol (VITAMIN D-3) 25 MCG (1000 UT) CAPS Take 1,000 Units by mouth daily with breakfast.   Continuous Blood Gluc Receiver (FREESTYLE LIBRE 2 READER) DEVI    Continuous Blood Gluc Sensor (FREESTYLE LIBRE 3 SENSOR) MISC SMARTSIG:1 Topical Every 2 Weeks   Continuous Blood Gluc Transmit (DEXCOM G6 TRANSMITTER) MISC USE AS DIRECTED REPLACE EVERY 3 MONTHS   Continuous Blood Gluc Transmit (DEXCOM G6 TRANSMITTER) MISC 1 each by Does not apply route as needed (replace q364m  glipiZIDE (GLUCOTROL XL) 10 MG 24 hr tablet TAKE 1 TABLET (10 MG TOTAL) BY MOUTH DAILY WITH BREAKFAST.   Glucose Blood (BLOOD GLUCOSE TEST STRIPS) STRP Please dispense based on patient and insurance preference. Use as directed to monitor FSBS 4x daily due to uncontrolled diabetes. Dx: E11.65   HYDROcodone-acetaminophen (NORCO) 5-325 MG tablet Take 1 tablet by mouth every 6 (six) hours as needed for moderate pain.   Lancets MISC Please dispense based on patient and insurance preference. Use as directed to monitor FSBS  4x daily due to uncontrolled diabetes. Dx: E11.65   levothyroxine (SYNTHROID) 125 MCG tablet Take 125 mcg by mouth daily before breakfast.   levothyroxine (SYNTHROID) 125 MCG tablet Take 1 tablet by mouth daily.   lidocaine-prilocaine (EMLA) cream Apply 1 application topically See admin instructions. APPLY CREAM TO PORTACATH SITE APPROXIMATELY 1 HOUR BEFORE ACCESS AND COVER   losartan (COZAAR) 100 MG tablet TAKE 1 TABLET BY MOUTH EVERY DAY   metoprolol tartrate (LOPRESSOR) 100 MG tablet TAKE 1 TABLET BY MOUTH TWICE A DAY   nitroGLYCERIN (NITROSTAT) 0.4 MG SL tablet PLACE 1 TABLET UNDER THE TONGUE EVERY 5 MINUTES AS NEEDED FOR CHEST PAIN.   ondansetron (ZOFRAN ODT) 4 MG disintegrating tablet Take 1 tablet (4 mg total) by mouth every 8 (eight) hours as needed for nausea or vomiting.   isosorbide mononitrate (IMDUR) 60 MG 24 hr tablet Take 2 tablets (120 mg total) by mouth daily.   No facility-administered encounter medications on file as of 03/04/2022.    Allergies (verified) Silver and Tape   History: Past Medical History:  Diagnosis Date   Acute blood loss anemia 01/12/2015   Adenocarcinoma, renal cell ([place]) 11/12/2012   Overview:   09/25/12 - CXR - lung masses  CT CAP - rt hilar mass, mult pulm nodules, LLL mass 3 x 3.1 cm, RLL mass 2.2 x 2.0 cm, left kidney mass 12.4 x 11.1 x 10.3 cm  10/05/12 PET - left lung mass 30 mm SUV 16.7, rt lung nodule 21 mm, SUV 10.4, left kidney mass 7 cm SUV 18, mass compressing RLL bronchus  10/11/12 lung biopsy - atypical cells  10/23/12 left kidney biopsy - high gr clear cell RCC    Anxiety    Arthritis    "maybe in my right hand" (05/26/2017)   CAD (coronary artery disease)    a. 12/2014 Cath: LM nl, LAD 20p, 68m D2 20ost, LCX 95p, 835mRCA dominant, 95p, 8077m RPDA 95, RPL2 50, attempted RCA PCI w/ acute RCA occlusion-->CABG x 2 VG->RPDA->PLVB.   Chronic diastolic CHF (congestive heart failure) (HCC)    Drug-induced hypothyroidism 03/13/2014   GERD  (gastroesophageal reflux disease) 03/18/2015   Hyperglycemia 01/12/2015   Hypertension    Hypoalbuminemia    Hypothyroidism    Lower extremity edema    Lung mass    LUNG MASSES--COUGH-BIOPSY NON-DIAGNOSTIC.  HX OF MEDIASTINAL MASS AND LUNG MASSES AGE 6 -NEGATIVE BIOSPY-BUT GIVEN DIAGNOSIS OF SARCOIDOSIS.  WORK UP CONTINUING ON DIAGNOSIS FOR PT'S LUNG MASSES--SHE HAS BIOPSY PROVEN KIDNEY CANCER   Mass of left breast 08/01/2011   [place]ocardial infarction ([place] X 2   Pain    HX OF EPISODES OF BREIF PAIN BACK OF HEAD-RIGHT SIDE- OCCURS WHEN PT TURNS HER HEAD TO RIGHT--BUT DOESN'T HAPPEN EVERY TIME SHE TURNS HER HEAD TO RIGHT--SHE HAS HAD ALL HER LIFE AND STATES PAST WORK [employer]- NEGATIVE FOR ANY BRAIN ISSUES.   Papilloma of breast 2012   left. lumpectomy   Pulmonary nodules  Renal cell cancer (HCC)    metastatic renal cell carcinoma (biopsy of left renal mass 10/2012) On nivolumab at [facility]   Renal mass 10/01/2012   10/23/12 Bx Pos high grad clear cell renal cell ca> referred to oncology 10/26/2012     S/p nephrectomy    left, due to metastatic RCC, 3/14   Past Surgical History:  Procedure Laterality Date   BREAST BIOPSY  08/18/2011   Procedure: BREAST BIOPSY WITH NEEDLE LOCALIZATION;  Surgeon: Merrie Roof, MD;  Location: [facility];  Service: General;  Laterality: Left;  left breast biopsy with needle localization   BRONCHIAL BRUSH BIOPSY  2014   CARDIAC CATHETERIZATION     CORONARY ARTERY BYPASS GRAFT N/A 01/02/2015   Procedure: CORONARY ARTERY BYPASS GRAFTING (CABG)TIMES TWO USING RIGHT GREATER SAPHENOUS LEG VEIN HARVESTED ENDOSCOPICALLY;  Surgeon: [name], MD;  Location: [facility];  Service: Open Heart Surgery;  Laterality: N/A;   LAPAROSCOPIC NEPHRECTOMY Left 11/22/2012   Procedure: LAPAROSCOPIC NEPHRECTOMY;  Surgeon: Dutch Gray, MD;  Location: [facility];  Service: Urology;  Laterality: Left;   LEFT HEART CATH AND CORS/GRAFTS ANGIOGRAPHY N/A 10/11/2016   Procedure: Left Heart Cath and  Cors/Grafts Angiography;  Surgeon: Sherren Mocha, MD;  Location: [facility];  Service: Cardiovascular;  Laterality: N/A;   LEFT HEART CATHETERIZATION WITH CORONARY ANGIOGRAM N/A 01/02/2015   Procedure: LEFT HEART CATHETERIZATION WITH CORONARY ANGIOGRAM;  Surgeon: Leonie Man, MD;  Location: [facility];  Service: Cardiovascular;  Laterality: N/A;   NECK LESION BIOPSY Left 08/2016   PORTA CATH INSERTION Right ~ 2017   [place] BRONCHOSCOPY  10/11/2012   Procedure: VIDEO BRONCHOSCOPY WITH FLUORO;  Surgeon: Kathee Delton, MD;  Location: [facility];  Service: Cardiopulmonary;  Laterality: Bilateral;   WIDS     WISDOM TOOTH EXTRACTION     "all 4"   Family History  Problem Relation Age of Onset   Heart disease Mother    Heart disease Father    Heart attack Father    Cancer Maternal Grandfather    Heart disease Brother    Stroke Brother    Aortic dissection Brother    Colon cancer Maternal Aunt    Skin cancer Maternal Uncle    Stomach cancer Maternal Uncle    Heart disease Maternal Grandmother    Breast cancer Maternal Aunt    Social History   Socioeconomic History   Marital status: Married    Spouse name: Not on file   Number of children: 0   Years of education: Not on file   Highest education level: Not on file  Occupational History   Occupation: UNEMPLOYEED    Comment: Radio broadcast assistant; 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   Tobacco Use   Smoking status: Former    Packs/day: 1.50    Years: 40.00    Total pack years: 60.00    Types: Cigarettes    Quit date: 09/24/2012    Years since quitting: 9.4   Smokeless tobacco: Never  Vaping Use   Vaping Use: Never used  Substance and Sexual Activity   Alcohol use: Yes    Comment: 05/26/2017 "couple drinks/year"   Drug use: No   Sexual activity: Yes  Other Topics Concern   Not on file  Social History Narrative   Not on file   Social Determinants of Health   Financial Resource Strain: Low Risk  (03/04/2022)   Overall  Financial Resource Strain (CARDIA)    Difficulty of Paying  Living Expenses: Not hard at all  Food Insecurity: No Food Insecurity (03/04/2022)   Hunger Vital Sign    Worried About Running Out of Food in the Last Year: Never true    Ran Out of Food in the Last Year: Never true  Transportation Needs: No Transportation Needs (03/04/2022)   PRAPARE - Hydrologist (Medical): No    Lack of Transportation (Non-Medical): No  Physical Activity: Insufficiently Active (03/04/2022)   Exercise Vital Sign    Days of Exercise per Week: 3 days    Minutes of Exercise per Session: 30 min  Stress: No Stress Concern Present (03/04/2022)   [place]    Feeling of Stress : Not at all  Social Connections: [place] (03/04/2022)   Social Connection and Isolation Panel [NHANES]    Frequency of Communication with Friends and Family: More than three times a week    Frequency of Social Gatherings with Friends and Family: More than three times a week    Attends Religious Services: More than 4 times per year    Active Member of [employer] or Organizations: Yes    Attends Music therapist: More than 4 times per year    Marital Status: Married    Tobacco Counseling Counseling given: Not Answered   Clinical Intake:  Pre-visit preparation completed: Yes  Pain : No/denies pain     BMI - recorded: 32.08 Nutritional Status: BMI > 30  Obese Nutritional Risks: None Diabetes: Yes  How often do you need to have someone help you when you read instructions, pamphlets, or other written materials from your doctor or pharmacy?: 1 - Never  Diabetic?Nutrition Risk Assessment:  Has the patient had any N/V/D within the last 2 months?  No  Does the patient have any non-healing wounds?  No  Has the patient had any unintentional weight loss or weight gain?  No   Diabetes:  Is the patient diabetic?  Yes  If  diabetic, was a CBG obtained today?  No  Did the patient bring in their glucometer from home?  No  How often do you monitor your CBG's? dail.   Financial Strains and Diabetes Management:  Are you having any financial strains with the device, your supplies or your medication? No .  Does the patient want to be seen by Chronic Care Management for management of their diabetes?  No  Would the patient like to be referred to a Nutritionist or for Diabetic Management?  No   Diabetic Exams:  Diabetic Eye Exam: Completed 07/2021.  Pt has been advised about the importance in completing this exam.  Diabetic Foot Exam: Completed due. Pt has been advised about the importance in completing this exam.   Interpreter Needed?: No  Information entered by :: mj Brentney Goldbach, lpn   Activities of Daily Living    03/04/2022   11:18 AM  In your present state of health, do you have any difficulty performing the following activities:  Hearing? 0  Vision? 0  Difficulty concentrating or making decisions? 0  Walking or climbing stairs? 0  Dressing or bathing? 0  Doing errands, shopping? 0  Preparing Food and eating ? N  Using the Toilet? N  In the past six months, have you accidently leaked urine? Y  Do you have problems with loss of bowel control? N  Managing your Medications? N  Managing your Finances? N  Housekeeping or managing your Housekeeping? N  Patient Care Team: Susy Frizzle, MD as PCP - General (Family Medicine) Sherren Mocha, MD as PCP - Cardiology (Cardiology) Jovita Kussmaul, MD as Attending Physician (General Surgery) Clance, Armando Reichert, MD as Attending Physician (Pulmonary Disease) [name], MD (Inactive) as Consulting Physician (Cardiothoracic Surgery) Mariel Craft, MD as Consulting Physician (Hematology and Oncology)  Indicate any recent Medical Services you may have received from other than [facility] providers in the past year (date may be approximate).      Assessment:   This is a routine wellness examination for [place].  Hearing/Vision screen Hearing Screening - Comments:: Some hearing issues.  Vision Screening - Comments:: [employer]. 07/2021. Readers.   Dietary issues and exercise activities discussed: Current Exercise Habits: Home exercise routine, Type of exercise: walking, Time (Minutes): 20, Frequency (Times/Week): 4, Weekly Exercise (Minutes/Week): 80, Intensity: Mild, Exercise limited by: cardiac condition(s)   Goals Addressed             This Visit's Progress    Exercise 3x per week (30 min per time)         Depression Screen    03/04/2022   11:16 AM 03/07/2018    3:10 PM  PHQ 2/9 Scores  PHQ - 2 Score 0 1    Fall Risk    03/04/2022   11:17 AM  Fall Risk   Falls in the past year? 0  Number falls in past yr: 0  Injury with Fall? 0  Risk for fall due to : No Fall Risks  Follow up Falls prevention discussed    [place]:  Any stairs in or around the home? Yes  If so, are there any without handrails? No  Home free of loose throw rugs in walkways, pet beds, electrical cords, etc? Yes  Adequate lighting in your home to reduce risk of falls? Yes   ASSISTIVE DEVICES UTILIZED TO PREVENT FALLS:  Life alert? No  Use of a cane, walker or w/c? No  Grab bars in the bathroom? No  Shower chair or bench in shower? No  Elevated toilet seat or a handicapped toilet? No   TIMED UP AND GO:  Was the test performed? No .  PHONE VISIT.  Cognitive Function:        03/04/2022   11:18 AM  6CIT Screen  What Year? 0 points  What month? 0 points  What time? 0 points  Count back from 20 0 points  Months in reverse 0 points  Repeat phrase 0 points  Total Score 0 points    Immunizations Immunization History  Administered Date(s) Administered   PFIZER Comirnaty(Gray Top)Covid-19 Tri-Sucrose Vaccine 12/22/2020, 01/26/2021    TDAP status: Due, Education has been  provided regarding the importance of this vaccine. Advised may receive this vaccine at local pharmacy or Health Dept. Aware to provide a copy of the vaccination record if obtained from local pharmacy or Health Dept. Verbalized acceptance and understanding.  Flu Vaccine status: Declined, Education has been provided regarding the importance of this vaccine but patient still declined. Advised may receive this vaccine at local pharmacy or Health Dept. Aware to provide a copy of the vaccination record if obtained from local pharmacy or Health Dept. Verbalized acceptance and understanding.  Pneumococcal vaccine status: Due, Education has been provided regarding the importance of this vaccine. Advised may receive this vaccine at local pharmacy or Health Dept. Aware to provide a copy of the vaccination record if obtained from local  pharmacy or Health Dept. Verbalized acceptance and understanding.  Covid-19 vaccine status: Completed vaccines  Qualifies for Shingles Vaccine? Yes   Zostavax completed No   Shingrix Completed?: No.    Education has been provided regarding the importance of this vaccine. Patient has been advised to call insurance company to determine out of pocket expense if they have not yet received this vaccine. Advised may also receive vaccine at local pharmacy or Health Dept. Verbalized acceptance and understanding.  Screening Tests Health Maintenance  Topic Date Due   FOOT EXAM  Never done   HIV Screening  Never done   Hepatitis C Screening  Never done   TETANUS/TDAP  Never done   Zoster Vaccines- Shingrix (1 of 2) Never done   COLONOSCOPY (Pts 45-43yr Insurance coverage will need to be confirmed)  Never done   HEMOGLOBIN A1C  07/05/2015   PAP SMEAR-Modifier  11/06/2017   COVID-19 Vaccine (3 - Pfizer risk series) 02/23/2021   OPHTHALMOLOGY EXAM  11/16/2021   INFLUENZA VACCINE  04/05/2022   MAMMOGRAM  01/14/2024   HPV VACCINES  Aged Out    Health Maintenance  Health  Maintenance Due  Topic Date Due   FOOT EXAM  Never done   HIV Screening  Never done   Hepatitis C Screening  Never done   TETANUS/TDAP  Never done   Zoster Vaccines- Shingrix (1 of 2) Never done   COLONOSCOPY (Pts 45-473yrInsurance coverage will need to be confirmed)  Never done   HEMOGLOBIN A1C  07/05/2015   PAP SMEAR-Modifier  11/06/2017   COVID-19 Vaccine (3 - Pfizer risk series) 02/23/2021   OPHTHALMOLOGY EXAM  11/16/2021    Colorectal cancer screening: Referral to GI placed COLOGUARD ORDER PLACED TODAY. Pt aware the office will call re: appt.  Mammogram status: Completed 01/13/2022. Repeat every year  Bone Density status: Ordered DUE AT AGE 89Pt provided with contact info and advised to call to schedule appt.  Lung Cancer Screening: (Low Dose CT Chest recommended if Age 64-80ears, 30 pack-year currently smoking OR have quit w/in 15years.) does not qualify.   Additional Screening:  Hepatitis C Screening: does qualify; Completed DUE  Vision Screening: Recommended annual ophthalmology exams for early detection of glaucoma and other disorders of the eye. Is the patient up to date with their annual eye exam?  Yes  Who is the provider or what is the name of the office in which the patient attends annual eye exams? [facility]. If pt is not established with a provider, would they like to be referred to a provider to establish care? No .   Dental Screening: Recommended annual dental exams for proper oral hygiene  Community Resource Referral / Chronic Care Management: CRR required this visit?  No   CCM required this visit?  No      Plan:     I have personally reviewed and noted the following in the patient's chart:   Medical and social history Use of alcohol, tobacco or illicit drugs  Current medications and supplements including opioid prescriptions. Patient is not currently taking opioid prescriptions. Functional ability and status Nutritional  status Physical activity Advanced directives List of other physicians Hospitalizations, surgeries, and ER visits in previous 12 months Vitals Screenings to include cognitive, depression, and falls Referrals and appointments  In addition, I have reviewed and discussed with patient certain preventive protocols, quality metrics, and best practice recommendations. A written personalized care plan for preventive services as well as general preventive health  recommendations were provided to patient.     Chriss Driver, LPN   9/56/2130   Nurse Notes: Pt declined vaccines at this time. Discussed Cologuard. Pt is interested and order placed.

## 2022-03-24 LAB — COLOGUARD: COLOGUARD: NEGATIVE

## 2022-04-04 ENCOUNTER — Telehealth: Payer: Self-pay | Admitting: Cardiovascular Disease

## 2022-04-04 NOTE — Telephone Encounter (Signed)
Call sent straight to triage. Patient complaining of chest pain that comes and goes, pain in left arm and shoulder, and SOB. Patient stated " I feel like I can hardly get around, I feel like an old women". Informed patient with her history and symptoms she should go to the ED. Patient stated she would rather see Dr. Burt Knack. Informed patient that she could see DOD tomorrow. Patient stated she is out of town and cannot come in tomorrow. Informed patient to go to the nearest ED to get evaluated. Patient stated she would go to urgent care instead. Made patient an appointment with DOD when she gets back in town next week at patient's request. Not sure if patient will actually go to urgent care or ED, at the least she will see DOD, Dr. Harrington Challenger, next Tuesday. Will send message to Dr. Burt Knack, so he is aware.

## 2022-04-04 NOTE — Telephone Encounter (Signed)
Pt c/o of Chest Pain: STAT if CP now or developed within 24 hours  1. Are you having CP right now? No  2. Are you experiencing any other symptoms (ex. SOB, nausea, vomiting, sweating)? SOB   3. How long have you been experiencing CP? Has been worsening since Easter   4. Is your CP continuous or coming and going? Coming and going   5. Have you taken Nitroglycerin? Yes, took 1 tablet this morning.  ?

## 2022-04-05 NOTE — Telephone Encounter (Signed)
Thx for letting me know. Agree with your recommendations.

## 2022-04-06 ENCOUNTER — Other Ambulatory Visit: Payer: Self-pay | Admitting: Cardiovascular Disease

## 2022-04-08 NOTE — Telephone Encounter (Signed)
Per OV note on 12/01/21 by [facility]:  Stable with improvement in her angina.  She will continue on aspirin, clopidogrel, isosorbide, and metoprolol.  I will see her back in 6 months.  Medication will be refilled at this time.

## 2022-04-12 ENCOUNTER — Encounter: Payer: Self-pay | Admitting: Internal Medicine

## 2022-04-12 ENCOUNTER — Ambulatory Visit: Payer: Medicare Other | Admitting: Internal Medicine

## 2022-04-12 VITALS — BP 134/88 | HR 66 | Ht 68.0 in | Wt 219.0 lb

## 2022-04-12 DIAGNOSIS — I25708 Atherosclerosis of coronary artery bypass graft(s), unspecified, with other forms of angina pectoris: Secondary | ICD-10-CM | POA: Diagnosis not present

## 2022-04-12 DIAGNOSIS — R0602 Shortness of breath: Secondary | ICD-10-CM

## 2022-04-12 DIAGNOSIS — I25119 Atherosclerotic heart disease of native coronary artery with unspecified angina pectoris: Secondary | ICD-10-CM

## 2022-04-12 DIAGNOSIS — I10 Essential (primary) hypertension: Secondary | ICD-10-CM

## 2022-04-12 DIAGNOSIS — Z79899 Other long term (current) drug therapy: Secondary | ICD-10-CM

## 2022-04-12 MED ORDER — POTASSIUM CHLORIDE ER 10 MEQ PO TBCR: 10.0000 meq | EXTENDED_RELEASE_TABLET | ORAL | 3 refills | Status: DC

## 2022-04-12 MED ORDER — FUROSEMIDE 40 MG PO TABS: 40.0000 mg | ORAL_TABLET | ORAL | 3 refills | Status: DC

## 2022-04-12 NOTE — Patient Instructions (Signed)
Medication Instructions:  Lasix 40 mg and KCL 10 meq together every other day   *If you need a refill on your cardiac medications before your next appointment, please call your pharmacy*   Lab Work: BMET AND PRO BNP IN ONE WEEK  If you have labs (blood work) drawn today and your tests are completely normal, you will receive your results only by: [place] (if you have MyChart) OR A paper copy in the mail If you have any lab test that is abnormal or we need to change your treatment, we will call you to review the results.   Testing/Procedures:  Your physician has recommended that you have a pulmonary function test. Pulmonary Function Tests are a group of tests that measure how well air moves in and out of your lungs.     Follow-Up: At [facility], you and your health needs are our priority.  As part of our continuing mission to provide you with exceptional heart care, we have created designated Provider Care Teams.  These Care Teams include your primary Cardiologist (physician) and Advanced Practice Providers (APPs -  Physician Assistants and Nurse Practitioners) who all work together to provide you with the care you need, when you need it.  We recommend signing up for the patient portal called "MyChart".  Sign up information is provided on this After Visit Summary.  MyChart is used to connect with patients for Virtual Visits (Telemedicine).  Patients are able to view lab/test results, encounter notes, upcoming appointments, etc.  Non-urgent messages can be sent to your provider as well.   To learn more about what you can do with MyChart, go to [email or web address].     Important Information About Sugar

## 2022-04-12 NOTE — Progress Notes (Addendum)
Cardiology Office Note:    Date:  04/12/2022   ID:  Sheri Hernandez, DOB [birth year], MRN [id number]  PCP:  Susy Frizzle, MD   [facility] Providers Cardiologist:  Sherren Mocha, MD     Referring MD: Susy Frizzle, MD   Pt presents for evaluation of CP    History of Present Illness:    Sheri Hernandez is a 66 y.o. female with a hx of CAD with chronic angina, r The pt phas hx of NSTEM in 2016  Underwent complex PCI of RCA   Complicated by dissection with patient going on to emergent CABG (SVG to PDA/PLSA).   Since then the pt had recurrent angina  Repeat cath showed occlusion of the SVG   RCA remained patent with tight stenosis   REcomm medical Rx    Pt also has a hx of metastatic renal CA (s/p nephrectomy), breast CA (s/p lumpectomy) HTN, HL  The pt is followed by [place] seen in clinic in March 2023      The pt presents today as an add on.   Says for the past 3 to 4 wks she feels like she cant get enough air.   She feels panicky    Denies wheezing.   Denies CP       The pt says when she cath before she had a doom and gloom sensation.   No pain   Felt like she was dying   She has not had that now    Pt says she is sleeping OK  Sleeps in chair, has been since high school  She just got back from the beach   Has had some ankle swelling since before going down.  IT has not gotten better   Admits to eating a lot of seafood   Pateint set to have an echo on Aug 30     Past Medical History:  Diagnosis Date   Acute blood loss anemia 01/12/2015   Adenocarcinoma, renal cell ([place]) 11/12/2012   Overview:   09/25/12 - CXR - lung masses  CT CAP - rt hilar mass, mult pulm nodules, LLL mass 3 x 3.1 cm, RLL mass 2.2 x 2.0 cm, left kidney mass 12.4 x 11.1 x 10.3 cm  10/05/12 PET - left lung mass 30 mm SUV 16.7, rt lung nodule 21 mm, SUV 10.4, left kidney mass 7 cm SUV 18, mass compressing RLL bronchus  10/11/12 lung biopsy - atypical cells  10/23/12 left kidney biopsy - high gr  clear cell RCC    Anxiety    Arthritis    "maybe in my right hand" (05/26/2017)   CAD (coronary artery disease)    a. 12/2014 Cath: LM nl, LAD 20p, 23m D2 20ost, LCX 95p, 835mRCA dominant, 95p, 8020m RPDA 95, RPL2 50, attempted RCA PCI w/ acute RCA occlusion-->CABG x 2 VG->RPDA->PLVB.   Chronic diastolic CHF (congestive heart failure) (HCC)    Drug-induced hypothyroidism 03/13/2014   GERD (gastroesophageal reflux disease) 03/18/2015   Hyperglycemia 01/12/2015   Hypertension    Hypoalbuminemia    Hypothyroidism    Lower extremity edema    Lung mass    LUNG MASSES--COUGH-BIOPSY NON-DIAGNOSTIC.  HX OF MEDIASTINAL MASS AND LUNG MASSES AGE 38 -NEGATIVE BIOSPY-BUT GIVEN DIAGNOSIS OF SARCOIDOSIS.  WORK UP CONTINUING ON DIAGNOSIS FOR PT'S LUNG MASSES--SHE HAS BIOPSY PROVEN KIDNEY CANCER   Mass of left breast 08/01/2011   [place] X 2   Pain  HX OF EPISODES OF BREIF PAIN BACK OF HEAD-RIGHT SIDE- OCCURS WHEN PT TURNS HER HEAD TO RIGHT--BUT DOESN'T HAPPEN EVERY TIME SHE TURNS HER HEAD TO RIGHT--SHE HAS HAD ALL HER LIFE AND STATES PAST WORK UPS- NEGATIVE FOR ANY BRAIN ISSUES.   Papilloma of breast 2012   left. lumpectomy   Pulmonary nodules    Renal cell cancer (HCC)    metastatic renal cell carcinoma (biopsy of left renal mass 10/2012) On nivolumab at [facility]   Renal mass 10/01/2012   10/23/12 Bx Pos high grad clear cell renal cell ca> referred to oncology 10/26/2012     S/p nephrectomy    left, due to metastatic RCC, 3/14    Past Surgical History:  Procedure Laterality Date   BREAST BIOPSY  08/18/2011   Procedure: BREAST BIOPSY WITH NEEDLE LOCALIZATION;  Surgeon: Merrie Roof, MD;  Location: [place];  Service: General;  Laterality: Left;  left breast biopsy with needle localization   BRONCHIAL BRUSH BIOPSY  2014   CARDIAC CATHETERIZATION     CORONARY ARTERY BYPASS GRAFT N/A 01/02/2015   Procedure: CORONARY ARTERY BYPASS GRAFTING (CABG)TIMES TWO USING RIGHT GREATER  SAPHENOUS LEG VEIN HARVESTED ENDOSCOPICALLY;  Surgeon: [name], MD;  Location: [place];  Service: Open Heart Surgery;  Laterality: N/A;   LAPAROSCOPIC NEPHRECTOMY Left 11/22/2012   Procedure: LAPAROSCOPIC NEPHRECTOMY;  Surgeon: Dutch Gray, MD;  [facility];  Service: Urology;  Laterality: Left;   LEFT HEART CATH AND CORS/GRAFTS ANGIOGRAPHY N/A 10/11/2016   Procedure: Left Heart Cath and Cors/Grafts Angiography;  Surgeon: Sherren Mocha, MD;  Location: [place] CV LAB;  Service: Cardiovascular;  Laterality: N/A;   LEFT HEART CATHETERIZATION WITH CORONARY ANGIOGRAM N/A 01/02/2015   Procedure: LEFT HEART CATHETERIZATION WITH CORONARY ANGIOGRAM;  Surgeon: Leonie Man, MD;  Location: [facility] CATH LAB;  Service: Cardiovascular;  Laterality: N/A;   NECK LESION BIOPSY Left 08/2016   PORTA CATH INSERTION Right ~ 2017   [place] BRONCHOSCOPY  10/11/2012   Procedure: VIDEO BRONCHOSCOPY WITH FLUORO;  Surgeon: Kathee Delton, MD;  [facility];  Service: Cardiopulmonary;  Laterality: Bilateral;   WIDS     WISDOM TOOTH EXTRACTION     "all 4"    Current Medications: Current Meds  Medication Sig   acetaminophen (TYLENOL) 500 MG tablet Take 500-1,000 mg by mouth every 6 (six) hours as needed for mild pain or headache.   aspirin 81 MG EC tablet Take 1 tablet (81 mg total) by mouth daily.   atorvastatin (LIPITOR) 40 MG tablet TAKE 1 TABLET BY MOUTH EVERY DAY   Blood Glucose Monitoring Suppl (BLOOD GLUCOSE SYSTEM PAK) KIT Please dispense based on patient and insurance preference. Use as directed to monitor FSBS 4x daily due to uncontrolled diabetes. Dx: E11.65   Cholecalciferol (VITAMIN D-3) 25 MCG (1000 UT) CAPS Take 1,000 Units by mouth daily with breakfast.   clopidogrel (PLAVIX) 75 MG tablet TAKE 1 TABLET BY MOUTH EVERY DAY   Continuous Blood Gluc Receiver (FREESTYLE LIBRE 2 READER) DEVI    Continuous Blood Gluc Sensor (FREESTYLE LIBRE 3 SENSOR) MISC SMARTSIG:1  Topical Every 2 Weeks   furosemide (LASIX) 40 MG tablet Take 1 tablet (40 mg total) by mouth every other day.   glipiZIDE (GLUCOTROL XL) 10 MG 24 hr tablet TAKE 1 TABLET (10 MG TOTAL) BY MOUTH DAILY WITH BREAKFAST.   Glucose Blood (BLOOD GLUCOSE TEST STRIPS) STRP Please dispense based on patient and insurance preference. Use as  directed to monitor FSBS 4x daily due to uncontrolled diabetes. Dx: E11.65   HYDROcodone-acetaminophen (NORCO) 5-325 MG tablet Take 1 tablet by mouth every 6 (six) hours as needed for moderate pain.   Lancets MISC Please dispense based on patient and insurance preference. Use as directed to monitor FSBS 4x daily due to uncontrolled diabetes. Dx: E11.65   levothyroxine (SYNTHROID) 125 MCG tablet Take 1 tablet by mouth daily.   lidocaine-prilocaine (EMLA) cream Apply 1 application topically See admin instructions. APPLY CREAM TO PORTACATH SITE APPROXIMATELY 1 HOUR BEFORE ACCESS AND COVER   losartan (COZAAR) 100 MG tablet TAKE 1 TABLET BY MOUTH EVERY DAY   metoprolol tartrate (LOPRESSOR) 100 MG tablet TAKE 1 TABLET BY MOUTH TWICE A DAY   nitroGLYCERIN (NITROSTAT) 0.4 MG SL tablet PLACE 1 TABLET UNDER THE TONGUE EVERY 5 MINUTES AS NEEDED FOR CHEST PAIN.   potassium chloride (KLOR-CON) 10 MEQ tablet Take 1 tablet (10 mEq total) by mouth every other day.     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   Tobacco Use   Smoking status: Former    Packs/day: 1.50    Years: 40.00    Total pack years: 60.00    Types: Cigarettes    Quit date: 09/24/2012    Years since quitting: 9.5   Smokeless tobacco: Never  Vaping Use   Vaping Use: Never used  Substance and Sexual Activity   Alcohol use: Yes    Comment:  05/26/2017 "couple drinks/year"   Drug use: No   Sexual activity: Yes  Other Topics Concern   Not on file  Social History Narrative   Not on file   Social Determinants of Health   Financial Resource Strain: Low Risk  (03/04/2022)   Overall Financial Resource Strain (CARDIA)    Difficulty of Paying Living Expenses: Not hard at all  Food Insecurity: No Food Insecurity (03/04/2022)   Hunger Vital Sign    Worried About Running Out of Food in the Last Year: Never true    Ran Out of Food in the Last Year: Never true  Transportation Needs: No Transportation Needs (03/04/2022)   PRAPARE - Hydrologist (Medical): No    Lack of Transportation (Non-Medical): No  Physical Activity: Insufficiently Active (03/04/2022)   Exercise Vital Sign    Days of Exercise per Week: 3 days    Minutes of Exercise per Session: 30 min  Stress: No Stress Concern Present (03/04/2022)   [place]    Feeling of Stress : Not at all  Social Connections: [place] (03/04/2022)   Social Connection and Isolation Panel [NHANES]    Frequency of Communication with Friends and Family: More than three times a week    Frequency of Social Gatherings with Friends and Family: More than three times a week    Attends Religious Services: More than 4 times per year    Active Member of [employer] or Organizations: Yes    Attends Music therapist: More than 4 times per year    Marital Status: Married     Family History: The patient's family history includes  Aortic dissection in her brother; Breast cancer in her maternal aunt; Cancer in her maternal grandfather; Colon cancer in her maternal aunt; Heart attack in her father; Heart disease in her brother, father, maternal grandmother, and mother; Skin cancer in her maternal uncle; Stomach cancer in her maternal uncle; Stroke in her brother.  ROS:   Please see the history of  present illness.    All other systems reviewed and are negative.  EKGs/Labs/Other Studies Reviewed:    The following studies were reviewed today: Echo 06/03/2020:  1. Left ventricular ejection fraction, by estimation, is 55 to 60%. The  left ventricle has normal function. The left ventricle has no regional  wall motion abnormalities. Left ventricular diastolic parameters are  indeterminate. The average left  ventricular global longitudinal strain is -15.3 %. The global longitudinal  strain is abnormal.   2. Right ventricular systolic function is normal. The right ventricular  size is normal. There is normal pulmonary artery systolic pressure.   3. The mitral valve is normal in structure. Trivial mitral valve  regurgitation. No evidence of mitral stenosis.   4. The aortic valve was not well visualized. Aortic valve regurgitation  is trivial. No aortic stenosis is present.   5. The inferior vena cava is normal in size with greater than 50%  respiratory variability, suggesting right atrial pressure of 3 mmHg.  EKG:  EKG is  ordered today.  SR 66 bpm   LVH   Septal MI  Q wave III Recent Labs: No results found for requested labs within last 365 days.  Recent Lipid Panel    Component Value Date/Time   CHOL 90 01/03/2015 0210   TRIG 77 01/03/2015 0210   HDL 15 (L) 01/03/2015 0210   CHOLHDL 6.0 01/03/2015 0210   VLDL 15 01/03/2015 0210   LDLCALC 60 01/03/2015 0210     Risk Assessment/Calculations:           Physical Exam:    VS:  BP 134/88   Pulse 66   Ht 5' 8" (1.727 m)   Wt 219 lb (99.3 kg) Comment: Pt stated weight  SpO2 94%   BMI 33.30 kg/m     Wt Readings from Last 3 Encounters:  04/12/22 219 lb (99.3 kg)  03/04/22 211 lb (95.7 kg)  12/01/21 213 lb (96.6 kg)     GEN: Obese 66 yo in no acute distress HEENT: Normal NECK: No JVD; No carotid bruits CARDIAC: RRR, 2/6 systolic murmur at the RUSB RESPIRATORY:  Some decreased flow on forced expiration  Very mild upper  airway wheeze  No rales   ABDOMEN: Soft, non-tender, non-distended MUSCULOSKELETAL:  2+ edema; No deformity  SKIN: Warm and dry NEUROLOGIC:  Alert and oriented x 3 PSYCHIATRIC:  Normal affect   ASSESSMENT:    1  Dypsnea  Pt has worsening dyspnea over the past few wks   On exam, some decreased airflow on forced expiration   Note she had mild obstructive dz on PFTs in 2014    Exam also significant for LE edema ? If symtoms related to volume overload     Would recomm labs first but she says she will only have through port  Done in [place]  WIll try Rx with Lasix 40 mg with 10 KC every other day.  Follow up BMET and BNP early next week  Pt has echo scheduled Will also set up for PFTs  2    CAD   Pt with complicated hx   Has severe  dz in RCA   I am not convinced above symtposm represent angina   Follow   3   HL  ON atorvastatin   Will need to have lipids followed     4   Hx of metastatic renal CA     Pt told that no evid recurrence    Medication Adjustments/Labs and Tests Ordered: Current medicines are reviewed at length with the patient today.  Concerns regarding medicines are outlined above.  Orders Placed This Encounter  Procedures   Basic Metabolic Panel (BMET)   Pro b natriuretic peptide (BNP)   EKG 12-Lead   Pulmonary function test   Meds ordered this encounter  Medications   furosemide (LASIX) 40 MG tablet    Sig: Take 1 tablet (40 mg total) by mouth every other day.    Dispense:  90 tablet    Refill:  3   potassium chloride (KLOR-CON) 10 MEQ tablet    Sig: Take 1 tablet (10 mEq total) by mouth every other day.    Dispense:  90 tablet    Refill:  3    Patient Instructions  Medication Instructions:  Lasix 40 mg and KCL 10 meq together every other day   *If you need a refill on your cardiac medications before your next appointment, please call your pharmacy*   Lab Work: BMET AND PRO BNP IN ONE WEEK  If you have labs (blood work) drawn today and your tests are  completely normal, you will receive your results only by: [place] (if you have MyChart) OR A paper copy in the mail If you have any lab test that is abnormal or we need to change your treatment, we will call you to review the results.   Testing/Procedures:  Your physician has recommended that you have a pulmonary function test. Pulmonary Function Tests are a group of tests that measure how well air moves in and out of your lungs.     Follow-Up: At [facility], you and your health needs are our priority.  As part of our continuing mission to provide you with exceptional heart care, we have created designated Provider Care Teams.  These Care Teams include your primary Cardiologist (physician) and Advanced Practice Providers (APPs -  Physician Assistants and Nurse Practitioners) who all work together to provide you with the care you need, when you need it.  We recommend signing up for the patient portal called "MyChart".  Sign up information is provided on this After Visit Summary.  MyChart is used to connect with patients for Virtual Visits (Telemedicine).  Patients are able to view lab/test results, encounter notes, upcoming appointments, etc.  Non-urgent messages can be sent to your provider as well.   To learn more about what you can do with MyChart, go to [email or web address].     Important Information About Sugar         Signed, [name], MD  04/12/2022 8:35 PM    [place]

## 2022-04-18 ENCOUNTER — Telehealth: Payer: Self-pay | Admitting: Internal Medicine

## 2022-04-18 NOTE — Telephone Encounter (Signed)
Pt c/o medication issue:  1. Name of Medication:   potassium chloride (KLOR-CON) 10 MEQ tablet    2. How are you currently taking this medication (dosage and times per day)? Take 1 tablet (10 mEq total) by mouth every other day.  3. Are you having a reaction (difficulty breathing--STAT)? No  4. What is your medication issue? Pt states that tablet is too large for her to take. She would like to know if she is able to break in half or can another dosage with smaller tablet be prescribed.  Pt would also like to know if requested lab work can be done at her Oncologist being that she has to have labs done there as well. Please advise

## 2022-04-18 NOTE — Telephone Encounter (Signed)
Called the patient.  She has not started the lasix or potassium yet because she was not going to be able to swallow the potassium pill.  She will try breaking it in half, dissolving it or placing it whole in applesauce or pudding to take it.    Would also like to have labs at cancer center where they access her port a cath for blood draws.  She know they should be done in about one week after starting the lasix and potassium.  Will plan to go there next Monday.

## 2022-05-04 ENCOUNTER — Ambulatory Visit (HOSPITAL_COMMUNITY): Payer: Medicare Other | Attending: Cardiology

## 2022-05-04 DIAGNOSIS — I25119 Atherosclerotic heart disease of native coronary artery with unspecified angina pectoris: Secondary | ICD-10-CM | POA: Insufficient documentation

## 2022-05-04 DIAGNOSIS — R011 Cardiac murmur, unspecified: Secondary | ICD-10-CM | POA: Diagnosis not present

## 2022-05-04 LAB — ECHOCARDIOGRAM COMPLETE
Area-P 1/2: 2.76 cm2
S' Lateral: 2.9 cm

## 2022-05-10 NOTE — Progress Notes (Unsigned)
Subjective:    Patient ID: Sheri Hernandez, female    DOB: 1955-11-03, 66 y.o.   MRN: [id number]  HPI Sheri Hernandez presents today with complaints of a lump near her clavicle since ***. She denies ***. He has a history significant for renal cell carcinoma with mets to the lung.  Past Medical History:  Diagnosis Date   Acute blood loss anemia 01/12/2015   Adenocarcinoma, renal cell ([place]) 11/12/2012   Overview:   09/25/12 - CXR - lung masses  CT CAP - rt hilar mass, mult pulm nodules, LLL mass 3 x 3.1 cm, RLL mass 2.2 x 2.0 cm, left kidney mass 12.4 x 11.1 x 10.3 cm  10/05/12 PET - left lung mass 30 mm SUV 16.7, rt lung nodule 21 mm, SUV 10.4, left kidney mass 7 cm SUV 18, mass compressing RLL bronchus  10/11/12 lung biopsy - atypical cells  10/23/12 left kidney biopsy - high gr clear cell RCC    Anxiety    Arthritis    "maybe in my right hand" (05/26/2017)   CAD (coronary artery disease)    a. 12/2014 Cath: LM nl, LAD 20p, 11m D2 20ost, LCX 95p, 864mRCA dominant, 95p, 8088m RPDA 95, RPL2 50, attempted RCA PCI w/ acute RCA occlusion-->CABG x 2 VG->RPDA->PLVB.   Chronic diastolic CHF (congestive heart failure) (HCC)    Drug-induced hypothyroidism 03/13/2014   GERD (gastroesophageal reflux disease) 03/18/2015   Hyperglycemia 01/12/2015   Hypertension    Hypoalbuminemia    Hypothyroidism    Lower extremity edema    Lung mass    LUNG MASSES--COUGH-BIOPSY NON-DIAGNOSTIC.  HX OF MEDIASTINAL MASS AND LUNG MASSES AGE 30 -NEGATIVE BIOSPY-BUT GIVEN DIAGNOSIS OF SARCOIDOSIS.  WORK UP CONTINUING ON DIAGNOSIS FOR PT'S LUNG MASSES--SHE HAS BIOPSY PROVEN KIDNEY CANCER   Mass of left breast 08/01/2011   Myocardial infarction ([place]/2016 X 2   Pain    HX OF EPISODES OF BREIF PAIN BACK OF HEAD-RIGHT SIDE- OCCURS WHEN PT TURNS HER HEAD TO RIGHT--BUT DOESN'T HAPPEN EVERY TIME SHE TURNS HER HEAD TO RIGHT--SHE HAS HAD ALL HER LIFE AND STATES PAST WORK UPS- NEGATIVE FOR ANY BRAIN ISSUES.   Papilloma of breast 2012    left. lumpectomy   Pulmonary nodules    Renal cell cancer (HCC)    metastatic renal cell carcinoma (biopsy of left renal mass 10/2012) [facility]   10/23/12 Bx Pos high grad clear cell renal cell ca> referred to oncology 10/26/2012     S/p nephrectomy    left, due to metastatic RCC, 3/14   Past Surgical History:  Procedure Laterality Date   BREAST BIOPSY  08/18/2011   Procedure: BREAST BIOPSY WITH NEEDLE LOCALIZATION;  Surgeon: PauMerrie RoofD;  [facility];  Laterality: Left;  left breast biopsy with needle localization   BRONCHIAL BRUSH BIOPSY  2014   CARDIAC CATHETERIZATION     CORONARY ARTERY BYPASS GRAFT N/A 01/02/2015   Procedure: CORONARY ARTERY BYPASS GRAFTING (CABG)TIMES TWO USING RIGHT GREATER SAPHENOUS LEG VEIN HARVESTED ENDOSCOPICALLY;  Surgeon: EdwGrace IsaacD;  [facility] [place] [place] [facility];  Laterality: N/A;   LAPAROSCOPIC NEPHRECTOMY Left 11/22/2012   Procedure: LAPAROSCOPIC NEPHRECTOMY;  Surgeon: LesDutch GrayD;  [facility];  Service: Urology;  Laterality: Left;   LEFT HEART CATH AND CORS/GRAFTS ANGIOGRAPHY N/A 10/11/2016   Procedure: Left Heart Cath and Cors/Grafts Angiography;  Surgeon: MicSherren MochaD;  Location: [place] CV LAB;  Service: Cardiovascular;  Laterality: N/A;   LEFT HEART CATHETERIZATION WITH CORONARY ANGIOGRAM N/A 01/02/2015   Procedure: LEFT HEART CATHETERIZATION WITH CORONARY ANGIOGRAM;  Surgeon: Leonie Man, MD;  Location: [facility] CATH LAB;  Service: Cardiovascular;  Laterality: N/A;   NECK LESION BIOPSY Left 08/2016   PORTA CATH INSERTION Right ~ 2017   [place] BRONCHOSCOPY  10/11/2012   Procedure: VIDEO BRONCHOSCOPY WITH FLUORO;  Surgeon: Kathee Delton, MD;  [facility];  Service: Cardiopulmonary;  Laterality: Bilateral;   WIDS     WISDOM TOOTH EXTRACTION     "all 4"   Current Outpatient Medications on File Prior to Visit   Medication Sig Dispense Refill   acetaminophen (TYLENOL) 500 MG tablet Take 500-1,000 mg by mouth every 6 (six) hours as needed for mild pain or headache.     aspirin 81 MG EC tablet Take 1 tablet (81 mg total) by mouth daily.     atorvastatin (LIPITOR) 40 MG tablet TAKE 1 TABLET BY MOUTH EVERY DAY 90 tablet 3   Blood Glucose Monitoring Suppl (BLOOD GLUCOSE SYSTEM PAK) KIT Please dispense based on patient and insurance preference. Use as directed to monitor FSBS 4x daily due to uncontrolled diabetes. Dx: E11.65 1 kit 1   Cholecalciferol (VITAMIN D-3) 25 MCG (1000 UT) CAPS Take 1,000 Units by mouth daily with breakfast.     clopidogrel (PLAVIX) 75 MG tablet TAKE 1 TABLET BY MOUTH EVERY DAY 90 tablet 3   Continuous Blood Gluc Receiver (FREESTYLE LIBRE 2 READER) DEVI      Continuous Blood Gluc Sensor (FREESTYLE LIBRE 3 SENSOR) MISC SMARTSIG:1 Topical Every 2 Weeks     Continuous Blood Gluc Transmit (DEXCOM G6 TRANSMITTER) MISC USE AS DIRECTED REPLACE EVERY 3 MONTHS (Patient not taking: Reported on 04/12/2022) 1 each 3   Continuous Blood Gluc Transmit (DEXCOM G6 TRANSMITTER) MISC 1 each by Does not apply route as needed (replace q40m. (Patient not taking: Reported on 04/12/2022) 1 each 3   furosemide (LASIX) 40 MG tablet Take 1 tablet (40 mg total) by mouth every other day. 90 tablet 3   glipiZIDE (GLUCOTROL XL) 10 MG 24 hr tablet TAKE 1 TABLET (10 MG TOTAL) BY MOUTH DAILY WITH BREAKFAST. 90 tablet 1   Glucose Blood (BLOOD GLUCOSE TEST STRIPS) STRP Please dispense based on patient and insurance preference. Use as directed to monitor FSBS 4x daily due to uncontrolled diabetes. Dx: E11.65 150 strip 11   HYDROcodone-acetaminophen (NORCO) 5-325 MG tablet Take 1 tablet by mouth every 6 (six) hours as needed for moderate pain. 30 tablet 0   isosorbide mononitrate (IMDUR) 60 MG 24 hr tablet Take 2 tablets (120 mg total) by mouth daily. 60 tablet 11   Lancets MISC Please dispense based on patient and insurance  preference. Use as directed to monitor FSBS 4x daily due to uncontrolled diabetes. Dx: E11.65 150 each 11   levothyroxine (SYNTHROID) 125 MCG tablet Take 125 mcg by mouth daily before breakfast. (Patient not taking: Reported on 04/12/2022)     levothyroxine (SYNTHROID) 125 MCG tablet Take 1 tablet by mouth daily.     lidocaine-prilocaine (EMLA) cream Apply 1 application topically See admin instructions. APPLY CREAM TO PORTACATH SITE APPROXIMATELY 1 HOUR BEFORE ACCESS AND COVER     losartan (COZAAR) 100 MG tablet TAKE 1 TABLET BY MOUTH EVERY DAY 90 tablet 3   metoprolol tartrate (LOPRESSOR) 100 MG tablet TAKE 1 TABLET BY MOUTH TWICE A  DAY 180 tablet 2   nitroGLYCERIN (NITROSTAT) 0.4 MG SL tablet PLACE 1 TABLET UNDER THE TONGUE EVERY 5 MINUTES AS NEEDED FOR CHEST PAIN. 25 tablet 3   ondansetron (ZOFRAN ODT) 4 MG disintegrating tablet Take 1 tablet (4 mg total) by mouth every 8 (eight) hours as needed for nausea or vomiting. (Patient not taking: Reported on 04/12/2022) 20 tablet 0   potassium chloride (KLOR-CON) 10 MEQ tablet Take 1 tablet (10 mEq total) by mouth every other day. 90 tablet 3   No current facility-administered medications on file prior to visit.   Allergies  Allergen Reactions   Silver Dermatitis and Other (See Comments)    Please use Mepilex bandaging ("brown/tan, not silver")   Tape Other (See Comments)    PLEASE DO NOT USE "PLASTIC" TAPE!! IT "RIPS OFF MY SKIN"     Review of Systems     Objective:   Physical Exam        Assessment & Plan:

## 2022-05-11 ENCOUNTER — Ambulatory Visit: Payer: Medicare Other | Admitting: Family Medicine

## 2022-05-11 VITALS — BP 132/72 | HR 68 | Temp 98.5°F | Ht 69.0 in | Wt 218.0 lb

## 2022-05-11 DIAGNOSIS — M799 Soft tissue disorder, unspecified: Secondary | ICD-10-CM

## 2022-05-11 DIAGNOSIS — F32A Depression, unspecified: Secondary | ICD-10-CM | POA: Diagnosis not present

## 2022-05-13 ENCOUNTER — Ambulatory Visit: Payer: Medicare Other | Admitting: Cardiovascular Disease

## 2022-06-07 ENCOUNTER — Other Ambulatory Visit: Payer: Self-pay | Admitting: Cardiovascular Disease

## 2022-06-07 ENCOUNTER — Other Ambulatory Visit: Payer: Self-pay

## 2022-06-07 DIAGNOSIS — E785 Hyperlipidemia, unspecified: Secondary | ICD-10-CM

## 2022-06-07 DIAGNOSIS — I25708 Atherosclerosis of coronary artery bypass graft(s), unspecified, with other forms of angina pectoris: Secondary | ICD-10-CM

## 2022-06-07 NOTE — Telephone Encounter (Signed)
Pharmacy faxed a refill request for Continuous Blood Gluc Sensor (FREESTYLE LIBRE 3 SENSOR) [place] [[id number]]    Order Details Dose, Route, Frequency: As Directed  Dispense Quantity: -- Refills: --        Sig: SMARTSIG:1 Topical Every 2 Weeks       Start Date: 08/26/21 End Date: --  Written Date: -- Expiration Date: --  Ordering Date: 12/01/21    Source:  Received from: [facility]

## 2022-06-07 NOTE — Telephone Encounter (Signed)
Rx refill sent to pharmacy. 

## 2022-06-08 ENCOUNTER — Other Ambulatory Visit: Payer: Self-pay

## 2022-06-08 DIAGNOSIS — E1159 Type 2 diabetes mellitus with other circulatory complications: Secondary | ICD-10-CM

## 2022-06-08 MED ORDER — FREESTYLE LIBRE 3 SENSOR MISC: 5 refills | Status: AC

## 2022-06-08 NOTE — Telephone Encounter (Signed)
Requested medication (s) are due for refill today: yes  Requested medication (s) are on the active medication list: yes  Last refill:  unsure, historical provider  Future visit scheduled: no  Notes to clinic:  Historical Provider, please assess.       Requested Prescriptions  Pending Prescriptions Disp Refills   Continuous Blood Gluc Sensor (FREESTYLE LIBRE 3 SENSOR) MISC       Endocrinology: Diabetes - Testing Supplies Failed - 06/07/2022  3:55 PM      Failed - Valid encounter within last 12 months    Recent Outpatient Visits           1 year ago S/P CABG x 2   [place] Pickard, Cammie Mcgee, MD   1 year ago Hx: UTI (urinary tract infection)   [place] Susy Frizzle, MD   1 year ago Controlled type 2 diabetes mellitus with complication, without long-term current use of insulin ([place])   [place] Pickard, Cammie Mcgee, MD   1 year ago [place] Dennard Schaumann Cammie Mcgee, MD   1 year ago Viral upper respiratory tract infection   [place] Pickard, Cammie Mcgee, MD

## 2022-07-05 ENCOUNTER — Other Ambulatory Visit: Payer: Self-pay | Admitting: Cardiovascular Disease

## 2022-09-22 ENCOUNTER — Encounter: Payer: Self-pay | Admitting: Cardiovascular Disease

## 2022-09-22 ENCOUNTER — Ambulatory Visit: Payer: Medicare Other | Attending: Cardiovascular Disease | Admitting: Cardiovascular Disease

## 2022-09-22 VITALS — BP 138/80 | HR 77 | Ht 68.0 in | Wt 224.0 lb

## 2022-09-22 DIAGNOSIS — E782 Mixed hyperlipidemia: Secondary | ICD-10-CM

## 2022-09-22 DIAGNOSIS — R0602 Shortness of breath: Secondary | ICD-10-CM

## 2022-09-22 DIAGNOSIS — I25119 Atherosclerotic heart disease of native coronary artery with unspecified angina pectoris: Secondary | ICD-10-CM

## 2022-09-22 DIAGNOSIS — I10 Essential (primary) hypertension: Secondary | ICD-10-CM | POA: Diagnosis not present

## 2022-09-22 MED ORDER — SPIRONOLACTONE 25 MG PO TABS: 25.0000 mg | ORAL_TABLET | Freq: Every day | ORAL | 3 refills | Status: DC

## 2022-09-22 NOTE — Progress Notes (Signed)
Cardiology Office Note:    Date:  09/22/2022   ID:  Sheri Hernandez, DOB 1956/02/16, MRN [id number]  PCP:  Donita Brooks, MD   [facility] Providers Cardiologist:  Tonny Bollman, MD     Referring MD: Donita Brooks, MD   Chief Complaint  Patient presents with   Coronary Artery Disease    History of Present Illness:    Sheri Hernandez is a 67 y.o. female with a hx of CAD, presenting for follow-up evaluation. The patient has a complicated history, initially presenting with non-STEMI in 2016 and undergoing complex PCI of severe stenosis in the RCA complicated by extensive dissection requiring emergency CABG with the saphenous vein graft to PDA and PLA.  The patient has had recurrent angina and follow-up catheterization demonstrated occlusion of her bypass graft.  The RCA remained patent with tight residual stenosis and medical therapy was recommended in the setting of controlled symptoms.  Comorbid conditions include history of metastatic renal cell carcinoma status post left nephrectomy, breast cancer status post left lumpectomy, hypertension, and mixed hyperlipidemia.   The patient is here alone today.  She has not been experiencing any angina.  States that her angina has improved significantly over time.  She is not requiring sublingual nitroglycerin any longer.  She continues to have some mild exertional dyspnea and her biggest complaint is chronic leg edema.  She is unable to take furosemide because the urinary frequency really bothers her.  She also has trouble swallowing potassium pills.  She denies orthopnea, PND, or lightheadedness.  She brings in her cardia mobile device and we went over her readings.  Some of the things that she is concerned about are artifactual.  I reviewed this with her today.  The only pertinent finding is there are occasional PVCs.  She's here alone today. She has  Past Medical History:  Diagnosis Date   Acute blood loss anemia 01/12/2015    Adenocarcinoma, renal cell (HCC) 11/12/2012   Overview:   09/25/12 - CXR - lung masses  CT CAP - rt hilar mass, mult pulm nodules, LLL mass 3 x 3.1 cm, RLL mass 2.2 x 2.0 cm, left kidney mass 12.4 x 11.1 x 10.3 cm  10/05/12 PET - left lung mass 30 mm SUV 16.7, rt lung nodule 21 mm, SUV 10.4, left kidney mass 7 cm SUV 18, mass compressing RLL bronchus  10/11/12 lung biopsy - atypical cells  10/23/12 left kidney biopsy - high gr clear cell RCC    Anxiety    Arthritis    "maybe in my right hand" (05/26/2017)   CAD (coronary artery disease)    a. 12/2014 Cath: LM nl, LAD 20p, 73m, D2 20ost, LCX 95p, 43m, RCA dominant, 95p, 49m/d, RPDA 95, RPL2 50, attempted RCA PCI w/ acute RCA occlusion-->CABG x 2 VG->RPDA->PLVB.   Chronic diastolic CHF (congestive heart failure) (HCC)    Drug-induced hypothyroidism 03/13/2014   GERD (gastroesophageal reflux disease) 03/18/2015   Hyperglycemia 01/12/2015   Hypertension    Hypoalbuminemia    Hypothyroidism    Lower extremity edema    Lung mass    LUNG MASSES--COUGH-BIOPSY NON-DIAGNOSTIC.  HX OF MEDIASTINAL MASS AND LUNG MASSES AGE 58 -NEGATIVE BIOSPY-BUT GIVEN DIAGNOSIS OF SARCOIDOSIS.  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   Papilloma of breast 2012   left. lumpectomy   Pulmonary nodules    Renal cell cancer (HCC)    metastatic renal cell carcinoma (biopsy of left renal mass 10/2012) On nivolumab at [facility]   Renal mass 10/01/2012   10/23/12 Bx Pos high grad clear cell renal cell ca> referred to oncology 10/26/2012     S/p nephrectomy    left, due to metastatic RCC, 3/14    Past Surgical History:   Procedure Laterality Date   BREAST BIOPSY  08/18/2011   Procedure: BREAST BIOPSY WITH NEEDLE LOCALIZATION;  Surgeon: Robyne Askew, MD;  Location: [facility] OR;  Service: General;  Laterality: Left;  left breast biopsy with needle localization   BRONCHIAL BRUSH BIOPSY  2014   CARDIAC CATHETERIZATION     CORONARY ARTERY BYPASS GRAFT N/A 01/02/2015   Procedure: CORONARY ARTERY BYPASS GRAFTING (CABG)TIMES TWO USING RIGHT GREATER SAPHENOUS LEG VEIN HARVESTED ENDOSCOPICALLY;  Surgeon: Delight Ovens, MD;  Location: [facility] OR;  Service: Open Heart Surgery;  Laterality: N/A;   LAPAROSCOPIC NEPHRECTOMY Left 11/22/2012   Procedure: LAPAROSCOPIC NEPHRECTOMY;  Surgeon: Crecencio Mc, MD;  Location: [facility] ORS;  Service: Urology;  Laterality: Left;   LEFT HEART CATH AND CORS/GRAFTS ANGIOGRAPHY N/A 10/11/2016   Procedure: Left Heart Cath and Cors/Grafts Angiography;  Surgeon: Tonny Bollman, MD;  Location: [facility] INVASIVE CV LAB;  Service: Cardiovascular;  Laterality: N/A;   LEFT HEART CATHETERIZATION WITH CORONARY ANGIOGRAM N/A 01/02/2015   Procedure: LEFT HEART CATHETERIZATION WITH CORONARY ANGIOGRAM;  Surgeon: Marykay Lex, MD;  Location: [facility] CATH LAB;  Service: Cardiovascular;  Laterality: N/A;   NECK LESION BIOPSY Left 08/2016   PORTA CATH INSERTION Right ~ 2017   TONSILLECTOMY  1967   VIDEO BRONCHOSCOPY  10/11/2012   Procedure: VIDEO BRONCHOSCOPY WITH FLUORO;  Surgeon: Barbaraann Share, MD;  Location: [facility] ENDOSCOPY;  Service: Cardiopulmonary;  Laterality: Bilateral;   WIDS     WISDOM TOOTH EXTRACTION     "all 4"    Current Medications: Current Meds  Medication Sig   acetaminophen (TYLENOL) 500 MG tablet Take 500-1,000 mg by mouth every 6 (six) hours as needed for mild pain or headache.   aspirin 81 MG EC tablet Take 1 tablet (81 mg total) by mouth daily.   atorvastatin (LIPITOR) 40 MG tablet TAKE 1 TABLET BY MOUTH EVERY DAY   Blood Glucose Monitoring Suppl (BLOOD GLUCOSE SYSTEM PAK) KIT Please dispense based on  patient and insurance preference. Use as directed to monitor FSBS 4x daily due to uncontrolled diabetes. Dx: E11.65   Cholecalciferol (VITAMIN D-3) 25 MCG (1000 UT) CAPS Take 1,000 Units by mouth daily with breakfast.   clopidogrel (PLAVIX) 75 MG tablet TAKE 1 TABLET BY MOUTH EVERY DAY   Continuous Blood Gluc Receiver (FREESTYLE LIBRE 2 READER) DEVI    Continuous Blood Gluc Sensor (FREESTYLE LIBRE 3 SENSOR) MISC SMARTSIG:1 Topical Every 2 Weeks   glipiZIDE (GLUCOTROL XL) 10 MG 24 hr tablet TAKE 1 TABLET (10 MG TOTAL) BY MOUTH DAILY WITH BREAKFAST.   Glucose Blood (BLOOD GLUCOSE TEST STRIPS) STRP Please dispense based on patient and insurance preference. Use as directed to monitor FSBS 4x daily due to uncontrolled diabetes. Dx: E11.65   HYDROcodone-acetaminophen (NORCO) 5-325 MG tablet Take 1 tablet by mouth every 6 (six)  hours as needed for moderate pain.   isosorbide mononitrate (IMDUR) 60 MG 24 hr tablet TAKE 2 TABLETS BY MOUTH DAILY.   Lancets MISC Please dispense based on patient and insurance preference. Use as directed to monitor FSBS 4x daily due to uncontrolled diabetes. Dx: E11.65   levothyroxine (SYNTHROID) 125 MCG tablet Take 1 tablet by mouth daily.   lidocaine-prilocaine (EMLA) cream Apply 1 application topically See admin instructions. APPLY CREAM TO PORTACATH SITE APPROXIMATELY 1 HOUR BEFORE ACCESS AND COVER   losartan (COZAAR) 100 MG tablet TAKE 1 TABLET BY MOUTH EVERY DAY   metoprolol tartrate (LOPRESSOR) 100 MG tablet TAKE 1 TABLET BY MOUTH TWICE A DAY   nitroGLYCERIN (NITROSTAT) 0.4 MG SL tablet Place 1 tablet (0.4 mg total) under the tongue every 5 (five) minutes as needed for chest pain.   ondansetron (ZOFRAN ODT) 4 MG disintegrating tablet Take 1 tablet (4 mg total) by mouth every 8 (eight) hours as needed for nausea or vomiting.   spironolactone (ALDACTONE) 25 MG tablet Take 1 tablet (25 mg total) by mouth daily.   [DISCONTINUED] furosemide (LASIX) 40 MG tablet Take 1 tablet  (40 mg total) by mouth every other day.   [DISCONTINUED] potassium chloride (KLOR-CON) 10 MEQ tablet Take 1 tablet (10 mEq total) by mouth every other day.     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   Tobacco Use   Smoking status: Former    Packs/day: 1.50    Years: 40.00    Total pack years: 60.00    Types: Cigarettes    Quit date: 09/24/2012    Years since quitting: 10.0   Smokeless tobacco: Never  Vaping Use   Vaping Use: Never used  Substance and Sexual Activity   Alcohol use: Yes    Comment: 05/26/2017 "couple drinks/year"   Drug use: No   Sexual activity: Yes  Other Topics Concern   Not on file  Social History Narrative   Not on file   Social Determinants of Health   Financial Resource Strain: Low Risk  (03/04/2022)   Overall Financial Resource Strain (CARDIA)    Difficulty of Paying Living Expenses: Not hard at all  Food Insecurity: No Food Insecurity (03/04/2022)   Hunger Vital Sign    Worried About Running Out of Food in the Last Year: Never true    Ran Out of Food in the Last Year: Never true  Transportation Needs: No Transportation Needs (03/04/2022)   PRAPARE - Hydrologist (Medical): No    Lack of Transportation (Non-Medical): No  Physical Activity: Insufficiently Active (03/04/2022)   Exercise Vital Sign    Days of Exercise per Week: 3 days    Minutes of Exercise per Session: 30 min  Stress: No Stress Concern Present (03/04/2022)   [place]    Feeling of Stress : Not at all  Social Connections: [place] (03/04/2022)   Social Connection and Isolation Panel [NHANES]    Frequency of  Communication with Friends and Family: More than three times a week    Frequency of Social Gatherings with Friends and Family: More than  three times a week    Attends Religious Services: More than 4 times per year    Active Member of Clubs or Organizations: Yes    Attends Engineer, structural: More than 4 times per year    Marital Status: Married     Family History: The patient's family history includes Aortic dissection in her brother; Breast cancer in her maternal aunt; Cancer in her maternal grandfather; Colon cancer in her maternal aunt; Heart attack in her father; Heart disease in her brother, father, maternal grandmother, and mother; Skin cancer in her maternal uncle; Stomach cancer in her maternal uncle; Stroke in her brother.  ROS:   Please see the history of present illness.    All other systems reviewed and are negative.  EKGs/Labs/Other Studies Reviewed:    The following studies were reviewed today: Echo 05/04/2022: 1. Left ventricular ejection fraction, by estimation, is 55 to 60%. The  left ventricle has normal function. The left ventricle demonstrates  regional wall motion abnormalities with basal inferior hypokinesis. There  is mild left ventricular hypertrophy.  Left ventricular diastolic parameters are consistent with Grade I  diastolic dysfunction (impaired relaxation).   2. Right ventricular systolic function is normal. The right ventricular  size is normal. There is normal pulmonary artery systolic pressure. The  estimated right ventricular systolic pressure is 23.6 mmHg.   3. Left atrial size was mildly dilated.   4. The mitral valve is normal in structure. Mild mitral valve  regurgitation. No evidence of mitral stenosis.   5. The aortic valve is tricuspid. There is moderate calcification of the  aortic valve. Aortic valve regurgitation is trivial. No aortic stenosis is  present.   6. Aortic dilatation noted. There is mild dilatation of the ascending   aorta, measuring 38 mm.   7. The inferior vena cava is normal in size with greater than 50%  respiratory variability, suggesting right atrial pressure of 3 mmHg.    EKG:  EKG is ordered today.  The ekg ordered today demonstrates normal sinus rhythm 77 bpm, age-indeterminate inferior infarct.  Recent Labs: No results found for requested labs within last 365 days.  Recent Lipid Panel    Component Value Date/Time   CHOL 90 01/03/2015 0210   TRIG 77 01/03/2015 0210   HDL 15 (L) 01/03/2015 0210   CHOLHDL 6.0 01/03/2015 0210   VLDL 15 01/03/2015 0210   LDLCALC 60 01/03/2015 0210     Risk Assessment/Calculations:                Physical Exam:    VS:  BP 138/80   Pulse 77   Ht 5\' 8"  (1.727 m)   Wt 224 lb (101.6 kg)   SpO2 95%   BMI 34.06 kg/m     Wt Readings from Last 3 Encounters:  09/22/22 224 lb (101.6 kg)  05/11/22 218 lb (98.9 kg)  04/12/22 219 lb (99.3 kg)     GEN:  Well nourished, well developed in no acute distress HEENT: Normal NECK: No JVD; No carotid bruits LYMPHATICS: No lymphadenopathy CARDIAC: RRR, 2/6 SEM at the RUSB RESPIRATORY:  Clear to auscultation without rales, wheezing or rhonchi  ABDOMEN: Soft, non-tender, non-distended MUSCULOSKELETAL:  2+ pretibial and ankle edema; No deformity  SKIN: Warm and dry NEUROLOGIC:  Alert and oriented x 3 PSYCHIATRIC:  Normal affect   ASSESSMENT:    1. Essential hypertension   2. SOB (shortness of breath)   3. Coronary artery disease involving native coronary artery of  native heart with angina pectoris (HCC)   4. Mixed hyperlipidemia    PLAN:    In order of problems listed above:  Blood pressure is controlled.  Continue current medical therapy. Appears clinically stable.  Despite her leg edema which I do not think is related to heart failure, she otherwise does not appear volume overloaded.  I reviewed her echo and it demonstrated normal LVEF and grade 1 diastolic dysfunction.  We discussed  pathophysiology of diastolic heart failure today.  She cannot tolerate furosemide because of frequent urination and inability to swallow potassium supplements.  I will try her on spironolactone 25 mg daily.  Will arrange for metabolic panel in 2 weeks. Stable on isosorbide and metoprolol.  Angina is better controlled now than it has been in the past.  She has not required any sublingual nitroglycerin. Will update her labs when she comes in 2 weeks for metabolic panel.  Since her labs are drawn through her port, this will be ordered at her oncology clinic.  We gave her a paper prescription to have labs drawn today.     Medication Adjustments/Labs and Tests Ordered: Current medicines are reviewed at length with the patient today.  Concerns regarding medicines are outlined above.  Orders Placed This Encounter  Procedures   EKG 12-Lead   Meds ordered this encounter  Medications   spironolactone (ALDACTONE) 25 MG tablet    Sig: Take 1 tablet (25 mg total) by mouth daily.    Dispense:  90 tablet    Refill:  3    Patient Instructions  Medication Instructions:  Please start Spironolactone 25 mg a day. Continue all other medications as listed.  *If you need a refill on your cardiac medications before your next appointment, please call your pharmacy*  Lab Work: Please have blood work 2 weeks after starting Spironolactone 25 mg a day.  If you have labs (blood work) drawn today and your tests are completely normal, you will receive your results only by: MyChart Message (if you have MyChart) OR A paper copy in the mail If you have any lab test that is abnormal or we need to change your treatment, we will call you to review the results.  Follow-Up: At [facility], you and your health needs are our priority.  As part of our continuing mission to provide you with exceptional heart care, we have created designated Provider Care Teams.  These Care Teams include your primary Cardiologist  (physician) and Advanced Practice Providers (APPs -  Physician Assistants and Nurse Practitioners) who all work together to provide you with the care you need, when you need it.  We recommend signing up for the patient portal called "MyChart".  Sign up information is provided on this After Visit Summary.  MyChart is used to connect with patients for Virtual Visits (Telemedicine).  Patients are able to view lab/test results, encounter notes, upcoming appointments, etc.  Non-urgent messages can be sent to your provider as well.   To learn more about what you can do with MyChart, go to [email or web address].    Your next appointment:   6 month(s)  Provider:   Tonny Bollman, MD        Signed, Tonny Bollman, MD  09/22/2022 5:00 PM     [facility]

## 2022-09-22 NOTE — Patient Instructions (Signed)
Medication Instructions:  Please start Spironolactone 25 mg a day. Continue all other medications as listed.  *If you need a refill on your cardiac medications before your next appointment, please call your pharmacy*  Lab Work: Please have blood work 2 weeks after starting Spironolactone 25 mg a day.  If you have labs (blood work) drawn today and your tests are completely normal, you will receive your results only by: MyChart Message (if you have MyChart) OR A paper copy in the mail If you have any lab test that is abnormal or we need to change your treatment, we will call you to review the results.  Follow-Up: At [facility], you and your health needs are our priority.  As part of our continuing mission to provide you with exceptional heart care, we have created designated Provider Care Teams.  These Care Teams include your primary Cardiologist (physician) and Advanced Practice Providers (APPs -  Physician Assistants and Nurse Practitioners) who all work together to provide you with the care you need, when you need it.  We recommend signing up for the patient portal called "MyChart".  Sign up information is provided on this After Visit Summary.  MyChart is used to connect with patients for Virtual Visits (Telemedicine).  Patients are able to view lab/test results, encounter notes, upcoming appointments, etc.  Non-urgent messages can be sent to your provider as well.   To learn more about what you can do with MyChart, go to [email or web address].    Your next appointment:   6 month(s)  Provider:   Tonny Bollman, MD

## 2022-11-08 ENCOUNTER — Telehealth: Payer: Self-pay | Admitting: Cardiovascular Disease

## 2022-11-08 DIAGNOSIS — I25708 Atherosclerosis of coronary artery bypass graft(s), unspecified, with other forms of angina pectoris: Secondary | ICD-10-CM

## 2022-11-08 DIAGNOSIS — E785 Hyperlipidemia, unspecified: Secondary | ICD-10-CM

## 2022-11-08 MED ORDER — NITROGLYCERIN 0.4 MG SL SUBL: 0.4000 mg | SUBLINGUAL_TABLET | SUBLINGUAL | 1 refills | Status: DC | PRN

## 2022-11-08 NOTE — Telephone Encounter (Signed)
Called patient back about her message. Patient stated that her husband and her are leaving in 8 days for a two week cruise. Patient stated they would be back on March 30 th. She would like 2 prescriptions of nitro sent to pharmacy. Patient also wants a prescription written for SCD's. Will send to Dr. Burt Knack and his nurse.

## 2022-11-08 NOTE — Telephone Encounter (Signed)
Returned call to patient and informed her that she would need to contact PCP for SCD devices that we do not prescribe DME equipment. I did send in nitroglycerin rx to pharmacy as requested to cover her for upcoming vacation. No further questions.

## 2022-11-08 NOTE — Telephone Encounter (Signed)
Patient states she is calling to speak to the nurse. Please advise

## 2022-12-11 ENCOUNTER — Other Ambulatory Visit: Payer: Self-pay | Admitting: Cardiovascular Disease

## 2022-12-19 ENCOUNTER — Ambulatory Visit: Payer: Medicare Other | Admitting: Cardiovascular Disease

## 2022-12-21 ENCOUNTER — Telehealth: Payer: Self-pay | Admitting: Cardiovascular Disease

## 2022-12-21 NOTE — Telephone Encounter (Signed)
Pt says she is supposed to go to [facility] tomorrow for a 2 wk blood check regarding a medication Dr. Excell Seltzer started the pt on. She only has 1 wk in her of the medication. She states if not sufficient, she would like to r/s but if the 1 wk if fine, then she will go. Please advise.

## 2022-12-21 NOTE — Telephone Encounter (Signed)
Spoke with patient to clarify what medication she is referring to and her concerns with lab work. She states she has only been taking spironolactone for 7 days and was supposed to have labs drawn at 2 weeks. She is due for her routine lab work at [facility] and will not be going back until July.  She wanted to make Dr Excell Seltzer aware. Advised I would forward her message to him for review.

## 2022-12-22 ENCOUNTER — Telehealth: Payer: Self-pay | Admitting: Cardiovascular Disease

## 2022-12-22 DIAGNOSIS — E785 Hyperlipidemia, unspecified: Secondary | ICD-10-CM

## 2022-12-22 DIAGNOSIS — Z79899 Other long term (current) drug therapy: Secondary | ICD-10-CM

## 2022-12-22 DIAGNOSIS — I25708 Atherosclerosis of coronary artery bypass graft(s), unspecified, with other forms of angina pectoris: Secondary | ICD-10-CM

## 2022-12-22 NOTE — Telephone Encounter (Signed)
Orders for labs, BMET, ALT, Lipids and LFT faxed to [facility] at [phone] per patient request.

## 2022-12-22 NOTE — Telephone Encounter (Signed)
Spoke with patient who states that she left her copy of the BMET order at home that she was intending to take to [facility] when they accessed her port. She called office and RN spoke with [facility] who advised we fax order over. [facility] states they never received the order and pt left without BMET being done. Spoke with Dr Excell Seltzer again, who states pt should stop medication or return to [facility] (or come to [place] to attempt peripheral draw) if wishing to continue Spironolactone. Pt states her legs 'are as small as they've ever been " and she's happy with the drug. After long discussion, she agrees to call [facility] tomorrow and try to get lab appt via port access completed in the next 2 weeks. States she will call us to provide update. Refuses peripheral stick.

## 2022-12-22 NOTE — Telephone Encounter (Signed)
Pt called stating she is headed to [facility] now for a 2 wk blood check regarding a medication Dr. Excell Seltzer started her on but forgot the order at home and would like to know if it can be electronically sent to [facility]. Pt was unable to provide the fax number and said she would callback once she arrives to the office. Please advise

## 2022-12-22 NOTE — Telephone Encounter (Signed)
Patient called to report fax number is [phone] so order can be faxed over.

## 2023-01-04 ENCOUNTER — Telehealth: Payer: Self-pay | Admitting: Cardiovascular Disease

## 2023-01-04 NOTE — Telephone Encounter (Signed)
Labs received from [facility]. Was being checked due to recent start on Spironolactone. Labs show K=3.8, creatinine=0.72, GFR>90. Advised patient that labs show she is doing well and to continue medication as prescribed. Sending labs to be scanned into chart.

## 2023-01-04 NOTE — Telephone Encounter (Signed)
Pt, with husband on the phone as well, states Dr. Excell Seltzer ordered labs to be done at [facility] to see if the medication pt is on for the swelling, is helping or not. The labs were done yesterday. Pt and pts husband state the results should've been faxed to the office so they can be made aware of the results. They would like to know today, if a fax has been received and what the results are. They are very insistent on knowing today due to pt having one kidney. Pt states she needs to know if she needs to continue taking the medication or not. Please advise.

## 2023-01-19 ENCOUNTER — Other Ambulatory Visit: Payer: Self-pay | Admitting: Cardiovascular Disease

## 2023-01-19 DIAGNOSIS — I2510 Atherosclerotic heart disease of native coronary artery without angina pectoris: Secondary | ICD-10-CM

## 2023-01-19 DIAGNOSIS — R002 Palpitations: Secondary | ICD-10-CM

## 2023-01-19 DIAGNOSIS — R609 Edema, unspecified: Secondary | ICD-10-CM

## 2023-02-06 ENCOUNTER — Telehealth: Payer: Self-pay | Admitting: Cardiovascular Disease

## 2023-02-06 ENCOUNTER — Other Ambulatory Visit: Payer: Self-pay | Admitting: Cardiovascular Disease

## 2023-02-06 DIAGNOSIS — E785 Hyperlipidemia, unspecified: Secondary | ICD-10-CM

## 2023-02-06 DIAGNOSIS — I25708 Atherosclerosis of coronary artery bypass graft(s), unspecified, with other forms of angina pectoris: Secondary | ICD-10-CM

## 2023-02-06 MED ORDER — NITROGLYCERIN 0.4 MG SL SUBL
0.4000 mg | SUBLINGUAL_TABLET | SUBLINGUAL | 1 refills | Status: DC | PRN
Start: 2023-02-06 — End: 2023-03-27

## 2023-02-06 NOTE — Telephone Encounter (Signed)
Pt c/o medication issue:  1. Name of Medication: nitroGLYCERIN (NITROSTAT) 0.4 MG SL tablet   2. How are you currently taking this medication (dosage and times per day)? Place 1 tablet (0.4 mg total) under the tongue every 5 (five) minutes as needed for chest pain.   3. Are you having a reaction (difficulty breathing--STAT)?  no  4. What is your medication issue? [place] and [place] medication. And where she is at now they only have the spray and she wants to make sure its okay for her to take. Please advise

## 2023-02-06 NOTE — Telephone Encounter (Signed)
Attempted to return call to patient, no answer. Left message asking her to call back. Spray available in [place] as well and would recommend getting here rather than overseas d/t FDA regulations here.

## 2023-02-06 NOTE — Telephone Encounter (Signed)
Patient is returning call.  °

## 2023-02-06 NOTE — Telephone Encounter (Signed)
Returned call to patient who states she's in [place] on vacation (was in [place]) and misplaced her bottle of NTG during the trip. Saw a provider there who provided her with the lingual spray per metered dose. She is NOT having any chest pain or symptoms, but is very nervous to not have something with her. Educated her that the spray is produced here in the [place] as well, she states hers is labeled that it's made in [place]. Advised I cannot guarantee safety of products outside of [place] manufacturing, but that in a pinch, it would be better than nothing. Per NIH site: Both sublingual tablet and lingual spray formulations caused maximal vasodilation at 3 minutes; however, the spray provided faster (at 2 minutes), greater, and more prolonged (15 minutes) vasodilation than the tablet. Requests we [facility] so she can have filled upon her return-she has taken several trips this year and misplaced/ruined 3 bottles.

## 2023-02-06 NOTE — Telephone Encounter (Signed)
Pt called in and she stated she can get the NIto lingual Pump Spray in [place].  She would like to know if this compares to the [facility] she takes here in the states.  This is a spray that she sprays under her tongue   [phone]

## 2023-03-23 ENCOUNTER — Telehealth: Payer: Self-pay | Admitting: Family Medicine

## 2023-03-23 ENCOUNTER — Other Ambulatory Visit: Payer: Self-pay | Admitting: Family Medicine

## 2023-03-23 ENCOUNTER — Ambulatory Visit (INDEPENDENT_AMBULATORY_CARE_PROVIDER_SITE_OTHER): Payer: Medicare Other

## 2023-03-23 VITALS — Ht 68.0 in | Wt 224.0 lb

## 2023-03-23 DIAGNOSIS — Z1231 Encounter for screening mammogram for malignant neoplasm of breast: Secondary | ICD-10-CM

## 2023-03-23 DIAGNOSIS — Z0000 Encounter for general adult medical examination without abnormal findings: Secondary | ICD-10-CM

## 2023-03-23 DIAGNOSIS — Z780 Asymptomatic menopausal state: Secondary | ICD-10-CM

## 2023-03-23 MED ORDER — FLUCONAZOLE 150 MG PO TABS: 150.0000 mg | ORAL_TABLET | Freq: Once | ORAL | 0 refills | Status: DC

## 2023-03-23 NOTE — Telephone Encounter (Signed)
Patient called to follow up on todauy's conversation with Nurse Health Advisor about having a yeast infection; stated she was told the provider would call a script in to her pharmacy. Patient went to the pharmacy and stated the script hasn't been called in yet.  Pharmacy confirmed as:  [id number] [place], [place] - [address] [place] [address] Phone: [phone]  Fax: [phone] DEA #: [id number]   Please advise at [phone].

## 2023-03-23 NOTE — Patient Instructions (Signed)
[name] , Thank you for taking time to come for your Medicare Wellness Visit. I appreciate your ongoing commitment to your health goals. Please review the following plan we discussed and let me know if I can assist you in the future.   These are the goals we discussed:  Goals      Exercise 3x per week (30 min per time)        This is a list of the screening recommended for you and due dates:  Health Maintenance  Topic Date Due   Complete foot exam   Never done   HIV Screening  Never done   Yearly kidney health urinalysis for diabetes  Never done   Hepatitis C Screening  Never done   DTaP/Tdap/Td vaccine (1 - Tdap) Never done   Zoster (Shingles) Vaccine (1 of 2) Never done   Hemoglobin A1C  07/05/2015   Pap Smear  11/06/2017   COVID-19 Vaccine (3 - Pfizer risk series) 02/23/2021   Yearly kidney function blood test for diabetes  05/22/2021   Eye exam for diabetics  11/16/2021   DEXA scan (bone density measurement)  Never done   Screening for Lung Cancer  03/22/2024*   Pneumonia Vaccine (1 of 2 - PCV) 03/22/2024*   Flu Shot  04/06/2023   Mammogram  01/14/2024   Medicare Annual Wellness Visit  03/22/2024   Cologuard (Stool DNA test)  03/18/2025   HPV Vaccine  Aged Out   Colon Cancer Screening  Discontinued  *Topic was postponed. The date shown is not the original due date.    Advanced directives: Information on Advanced Care Planning can be found at [facility] Advance Health Care Directives Advance Health Care Directives ([email or web address] Please bring a copy of your health care power of attorney and living will to the office to be added to your chart at your convenience.  Conditions/risks identified: Aim for 30 minutes of exercise or brisk walking, 6-8 glasses of water, and 5 servings of fruits and vegetables each day.  Next appointment: Follow up in one year for your annual wellness visit   You have an order for:  []   2D Mammogram  [x]   3D Mammogram  [x]    Bone Density     Please call for appointment:   [facility] [address] [place], [place] [address] [phone]  Make sure to wear two-piece clothing.  No lotions, powders, or deodorants the day of the appointment. Make sure to bring picture ID and insurance card.  Bring list of medications you are currently taking including any supplements.   Schedule your [place] screening mammogram through MyChart!   Log into your MyChart account.  Go to 'Visit' (or 'Appointments' if on mobile App) --> Schedule an Appointment  Under 'Select a Reason for Visit' choose the Mammogram Screening option.  Complete the pre-visit questions and select the time and place that best fits your schedule.   Preventive Care 65 Years and Older, Female Preventive care refers to lifestyle choices and visits with your health care provider that can promote health and wellness. What does preventive care include? A yearly physical exam. This is also called an annual well check. Dental exams once or twice a year. Routine eye exams. Ask your health care provider how often you should have your eyes checked. Personal lifestyle choices, including: Daily care of your teeth and gums. Regular physical activity. Eating a healthy diet. Avoiding tobacco and drug use. Limiting alcohol use.  Practicing safe sex. Taking low-dose aspirin every day. Taking vitamin and mineral supplements as recommended by your health care provider. What happens during an annual well check? The services and screenings done by your health care provider during your annual well check will depend on your age, overall health, lifestyle risk factors, and family history of disease. Counseling  Your health care provider may ask you questions about your: Alcohol use. Tobacco use. Drug use. Emotional well-being. Home and relationship well-being. Sexual activity. Eating habits. History of falls. Memory and ability  to understand (cognition). Work and work Astronomer. Reproductive health. Screening  You may have the following tests or measurements: Height, weight, and BMI. Blood pressure. Lipid and cholesterol levels. These may be checked every 5 years, or more frequently if you are over 45 years old. Skin check. Lung cancer screening. You may have this screening every year starting at age 103 if you have a 30-pack-year history of smoking and currently smoke or have quit within the past 15 years. Fecal occult blood test (FOBT) of the stool. You may have this test every year starting at age 40. Flexible sigmoidoscopy or colonoscopy. You may have a sigmoidoscopy every 5 years or a colonoscopy every 10 years starting at age 54. Hepatitis C blood test. Hepatitis B blood test. Sexually transmitted disease (STD) testing. Diabetes screening. This is done by checking your blood sugar (glucose) after you have not eaten for a while (fasting). You may have this done every 1-3 years. Bone density scan. This is done to screen for osteoporosis. You may have this done starting at age 74. Mammogram. This may be done every 1-2 years. Talk to your health care provider about how often you should have regular mammograms. Talk with your health care provider about your test results, treatment options, and if necessary, the need for more tests. Vaccines  Your health care provider may recommend certain vaccines, such as: Influenza vaccine. This is recommended every year. Tetanus, diphtheria, and acellular pertussis (Tdap, Td) vaccine. You may need a Td booster every 10 years. Zoster vaccine. You may need this after age 33. Pneumococcal 13-valent conjugate (PCV13) vaccine. One dose is recommended after age 73. Pneumococcal polysaccharide (PPSV23) vaccine. One dose is recommended after age 17. Talk to your health care provider about which screenings and vaccines you need and how often you need them. This information is not  intended to replace advice given to you by your health care provider. Make sure you discuss any questions you have with your health care provider. Document Released: 09/18/2015 Document Revised: 05/11/2016 Document Reviewed: 06/23/2015 Elsevier Interactive Patient Education  2017 [employer].  Fall Prevention in the Home Falls can cause injuries. They can happen to people of all ages. There are many things you can do to make your home safe and to help prevent falls. What can I do on the outside of my home? Regularly fix the edges of walkways and driveways and fix any cracks. Remove anything that might make you trip as you walk through a door, such as a raised step or threshold. Trim any bushes or trees on the path to your home. Use bright outdoor lighting. Clear any walking paths of anything that might make someone trip, such as rocks or tools. Regularly check to see if handrails are loose or broken. Make sure that both sides of any steps have handrails. Any raised decks and porches should have guardrails on the edges. Have any leaves, snow, or ice cleared regularly. Use sand or  salt on walking paths during winter. Clean up any spills in your garage right away. This includes oil or grease spills. What can I do in the bathroom? Use night lights. Install grab bars by the toilet and in the tub and shower. Do not use towel bars as grab bars. Use non-skid mats or decals in the tub or shower. If you need to sit down in the shower, use a plastic, non-slip stool. Keep the floor dry. Clean up any water that spills on the floor as soon as it happens. Remove soap buildup in the tub or shower regularly. Attach bath mats securely with double-sided non-slip rug tape. Do not have throw rugs and other things on the floor that can make you trip. What can I do in the bedroom? Use night lights. Make sure that you have a light by your bed that is easy to reach. Do not use any sheets or blankets that are  too big for your bed. They should not hang down onto the floor. Have a firm chair that has side arms. You can use this for support while you get dressed. Do not have throw rugs and other things on the floor that can make you trip. What can I do in the kitchen? Clean up any spills right away. Avoid walking on wet floors. Keep items that you use a lot in easy-to-reach places. If you need to reach something above you, use a strong step stool that has a grab bar. Keep electrical cords out of the way. Do not use floor polish or wax that makes floors slippery. If you must use wax, use non-skid floor wax. Do not have throw rugs and other things on the floor that can make you trip. What can I do with my stairs? Do not leave any items on the stairs. Make sure that there are handrails on both sides of the stairs and use them. Fix handrails that are broken or loose. Make sure that handrails are as long as the stairways. Check any carpeting to make sure that it is firmly attached to the stairs. Fix any carpet that is loose or worn. Avoid having throw rugs at the top or bottom of the stairs. If you do have throw rugs, attach them to the floor with carpet tape. Make sure that you have a light switch at the top of the stairs and the bottom of the stairs. If you do not have them, ask someone to add them for you. What else can I do to help prevent falls? Wear shoes that: Do not have high heels. Have rubber bottoms. Are comfortable and fit you well. Are closed at the toe. Do not wear sandals. If you use a stepladder: Make sure that it is fully opened. Do not climb a closed stepladder. Make sure that both sides of the stepladder are locked into place. Ask someone to hold it for you, if possible. Clearly mark and make sure that you can see: Any grab bars or handrails. First and last steps. Where the edge of each step is. Use tools that help you move around (mobility aids) if they are needed. These  include: Canes. Walkers. Scooters. Crutches. Turn on the lights when you go into a dark area. Replace any light bulbs as soon as they burn out. Set up your furniture so you have a clear path. Avoid moving your furniture around. If any of your floors are uneven, fix them. If there are any pets around you, be aware of  where they are. Review your medicines with your doctor. Some medicines can make you feel dizzy. This can increase your chance of falling. Ask your doctor what other things that you can do to help prevent falls. This information is not intended to replace advice given to you by your health care provider. Make sure you discuss any questions you have with your health care provider. Document Released: 06/18/2009 Document Revised: 01/28/2016 Document Reviewed: 09/26/2014 Elsevier Interactive Patient Education  2017 [employer].

## 2023-03-23 NOTE — Progress Notes (Signed)
Subjective:   Sheri Hernandez is a 67 y.o. female who presents for Medicare Annual (Subsequent) preventive examination.  Visit Complete: Virtual  I connected with  Sheri Hernandez on 03/23/23 by a audio enabled telemedicine application and verified that I am speaking with the correct person using two identifiers.  Patient Location: Home  Provider Location: Home Office  I discussed the limitations of evaluation and management by telemedicine. The patient expressed understanding and agreed to proceed.  Patient Medicare AWV questionnaire was completed by the patient on 03/22/23; I have confirmed that all information answered by patient is correct and no changes since this date.  Review of Systems     Cardiac Risk Factors include: advanced age (>45men, >30 women);diabetes mellitus;dyslipidemia  Per patient no change in vitals since last visit, unable to obtain new vitals due to telehealth visit     Objective:    Today's Vitals   03/23/23 1336  Weight: 224 lb (101.6 kg)  Height: 5\' 8"  (1.727 m)   Body mass index is 34.06 kg/m.     03/23/2023    1:44 PM 03/04/2022   11:17 AM 02/23/2021    2:46 PM 02/23/2021    2:45 PM 05/26/2017    7:58 PM 10/10/2016    9:37 AM 01/02/2015   11:32 AM  Advanced Directives  Does Patient Have a Medical Advance Directive? No No No No Yes Yes Yes  Type of Primary school teacher of [place];Living will Healthcare Power of [place];Living will  Does patient want to make changes to medical advance directive?     No - Patient declined No - Patient declined No - Patient declined  Copy of Healthcare Power of Attorney in Chart?     No - copy requested No - copy requested No - copy requested  Would patient like information on creating a medical advance directive? Yes (MAU/Ambulatory/Procedural Areas - Information given) No - Patient declined No - Patient declined  No - Patient declined      Current Medications  (verified) Outpatient Encounter Medications as of 03/23/2023  Medication Sig   acetaminophen (TYLENOL) 500 MG tablet Take 500-1,000 mg by mouth every 6 (six) hours as needed for mild pain or headache.   aspirin 81 MG EC tablet Take 1 tablet (81 mg total) by mouth daily.   atorvastatin (LIPITOR) 40 MG tablet TAKE 1 TABLET BY MOUTH EVERY DAY   Blood Glucose Monitoring Suppl (BLOOD GLUCOSE SYSTEM PAK) KIT Please dispense based on patient and insurance preference. Use as directed to monitor FSBS 4x daily due to uncontrolled diabetes. Dx: E11.65   Cholecalciferol (VITAMIN D-3) 25 MCG (1000 UT) CAPS Take 1,000 Units by mouth daily with breakfast.   clopidogrel (PLAVIX) 75 MG tablet TAKE 1 TABLET BY MOUTH EVERY DAY   Continuous Blood Gluc Receiver (FREESTYLE LIBRE 2 READER) DEVI    Continuous Blood Gluc Sensor (FREESTYLE LIBRE 3 SENSOR) MISC SMARTSIG:1 Topical Every 2 Weeks   glipiZIDE (GLUCOTROL XL) 10 MG 24 hr tablet TAKE 1 TABLET (10 MG TOTAL) BY MOUTH DAILY WITH BREAKFAST.   Glucose Blood (BLOOD GLUCOSE TEST STRIPS) STRP Please dispense based on patient and insurance preference. Use as directed to monitor FSBS 4x daily due to uncontrolled diabetes. Dx: E11.65   isosorbide mononitrate (IMDUR) 60 MG 24 hr tablet TAKE 2 TABLETS BY MOUTH DAILY.   Lancets MISC Please dispense based on patient and insurance preference. Use as directed to monitor FSBS 4x daily  due to uncontrolled diabetes. Dx: E11.65   levothyroxine (SYNTHROID) 125 MCG tablet Take 1 tablet by mouth daily.   lidocaine-prilocaine (EMLA) cream Apply 1 application topically See admin instructions. APPLY CREAM TO PORTACATH SITE APPROXIMATELY 1 HOUR BEFORE ACCESS AND COVER   losartan (COZAAR) 100 MG tablet TAKE 1 TABLET BY MOUTH EVERY DAY   metoprolol tartrate (LOPRESSOR) 100 MG tablet TAKE 1 TABLET BY MOUTH TWICE A DAY   nitroGLYCERIN (NITROSTAT) 0.4 MG SL tablet Place 1 tablet (0.4 mg total) under the tongue every 5 (five) minutes as needed for  chest pain.   spironolactone (ALDACTONE) 25 MG tablet Take 1 tablet (25 mg total) by mouth daily.   [DISCONTINUED] HYDROcodone-acetaminophen (NORCO) 5-325 MG tablet Take 1 tablet by mouth every 6 (six) hours as needed for moderate pain.   ondansetron (ZOFRAN ODT) 4 MG disintegrating tablet Take 1 tablet (4 mg total) by mouth every 8 (eight) hours as needed for nausea or vomiting. (Patient not taking: Reported on 03/23/2023)   No facility-administered encounter medications on file as of 03/23/2023.    Allergies (verified) Silver and Tape   History: Past Medical History:  Diagnosis Date   Acute blood loss anemia 01/12/2015   Adenocarcinoma, renal cell (HCC) 11/12/2012   Overview:   09/25/12 - CXR - lung masses  CT CAP - rt hilar mass, mult pulm nodules, LLL mass 3 x 3.1 cm, RLL mass 2.2 x 2.0 cm, left kidney mass 12.4 x 11.1 x 10.3 cm  10/05/12 PET - left lung mass 30 mm SUV 16.7, rt lung nodule 21 mm, SUV 10.4, left kidney mass 7 cm SUV 18, mass compressing RLL bronchus  10/11/12 lung biopsy - atypical cells  10/23/12 left kidney biopsy - high gr clear cell RCC    Anxiety    Arthritis    "maybe in my right hand" (05/26/2017)   CAD (coronary artery disease)    a. 12/2014 Cath: LM nl, LAD 20p, 56m, D2 20ost, LCX 95p, 16m, RCA dominant, 95p, 32m/d, RPDA 95, RPL2 50, attempted RCA PCI w/ acute RCA occlusion-->CABG x 2 VG->RPDA->PLVB.   Chronic diastolic CHF (congestive heart failure) (HCC)    Drug-induced hypothyroidism 03/13/2014   GERD (gastroesophageal reflux disease) 03/18/2015   Hyperglycemia 01/12/2015   Hypertension    Hypoalbuminemia    Hypothyroidism    Lower extremity edema    Lung mass    LUNG MASSES--COUGH-BIOPSY NON-DIAGNOSTIC.  HX OF MEDIASTINAL MASS AND LUNG MASSES AGE 63 -NEGATIVE BIOSPY-BUT GIVEN DIAGNOSIS OF SARCOIDOSIS.  WORK UP CONTINUING ON DIAGNOSIS FOR PT'S LUNG MASSES--SHE HAS BIOPSY PROVEN KIDNEY CANCER   Mass of left breast 08/01/2011   Myocardial infarction (HCC) 12/2014 X 2    Pain    HX OF EPISODES OF BREIF PAIN BACK OF HEAD-RIGHT SIDE- OCCURS WHEN PT TURNS HER HEAD TO RIGHT--BUT DOESN'T HAPPEN EVERY TIME SHE TURNS HER HEAD TO RIGHT--SHE HAS HAD ALL HER LIFE AND STATES PAST WORK [employer]- NEGATIVE FOR ANY BRAIN ISSUES.   Papilloma of breast 2012   left. lumpectomy   Pulmonary nodules    Renal cell cancer (HCC)    metastatic renal cell carcinoma (biopsy of left renal mass 10/2012) On nivolumab at [facility]   Renal mass 10/01/2012   10/23/12 Bx Pos high grad clear cell renal cell ca> referred to oncology 10/26/2012     S/p nephrectomy    left, due to metastatic RCC, 3/14   Past Surgical History:  Procedure Laterality Date   BREAST BIOPSY  08/18/2011  Procedure: BREAST BIOPSY WITH NEEDLE LOCALIZATION;  Surgeon: Robyne Askew, MD;  Location: [facility] OR;  Service: General;  Laterality: Left;  left breast biopsy with needle localization   BRONCHIAL BRUSH BIOPSY  2014   CARDIAC CATHETERIZATION     CORONARY ARTERY BYPASS GRAFT N/A 01/02/2015   Procedure: CORONARY ARTERY BYPASS GRAFTING (CABG)TIMES TWO USING RIGHT GREATER SAPHENOUS LEG VEIN HARVESTED ENDOSCOPICALLY;  Surgeon: Delight Ovens, MD;  Location: [facility] OR;  Service: Open Heart Surgery;  Laterality: N/A;   LAPAROSCOPIC NEPHRECTOMY Left 11/22/2012   Procedure: LAPAROSCOPIC NEPHRECTOMY;  Surgeon: Crecencio Mc, MD;  Location: [facility] ORS;  Service: Urology;  Laterality: Left;   LEFT HEART CATH AND CORS/GRAFTS ANGIOGRAPHY N/A 10/11/2016   Procedure: Left Heart Cath and Cors/Grafts Angiography;  Surgeon: Tonny Bollman, MD;  Location: [facility] INVASIVE CV LAB;  Service: Cardiovascular;  Laterality: N/A;   LEFT HEART CATHETERIZATION WITH CORONARY ANGIOGRAM N/A 01/02/2015   Procedure: LEFT HEART CATHETERIZATION WITH CORONARY ANGIOGRAM;  Surgeon: Marykay Lex, MD;  Location: [facility] CATH LAB;  Service: Cardiovascular;  Laterality: N/A;   NECK LESION BIOPSY Left 08/2016   PORTA CATH INSERTION Right ~ 2017   TONSILLECTOMY  1967   VIDEO BRONCHOSCOPY   10/11/2012   Procedure: VIDEO BRONCHOSCOPY WITH FLUORO;  Surgeon: Barbaraann Share, MD;  Location: [facility] ENDOSCOPY;  Service: Cardiopulmonary;  Laterality: Bilateral;   WIDS     WISDOM TOOTH EXTRACTION     "all 4"   Family History  Problem Relation Age of Onset   Heart disease Mother    Heart disease Father    Heart attack Father    Cancer Maternal Grandfather    Heart disease Brother    Stroke Brother    Aortic dissection Brother    Colon cancer Maternal Aunt    Skin cancer Maternal Uncle    Stomach cancer Maternal Uncle    Heart disease Maternal Grandmother    Breast cancer Maternal Aunt    Social History   Socioeconomic History   Marital status: Married    Spouse name: Not on file   Number of children: 0   Years of education: Not on file   Highest education level: Not on file  Occupational History   Occupation: UNEMPLOYEED    Comment: IT consultant; 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   Tobacco Use   Smoking status: Former    Current packs/day: 0.00    Average packs/day: 1.5 packs/day for 40.0 years (60.0 ttl pk-yrs)    Types: Cigarettes    Start date: 09/24/1972    Quit date: 09/24/2012    Years since quitting: 10.4   Smokeless tobacco: Never  Vaping Use   Vaping status: Never Used  Substance and Sexual Activity   Alcohol use: Yes    Comment: 05/26/2017 "couple drinks/year"   Drug use: No   Sexual activity: Yes  Other Topics Concern   Not on file  Social History Narrative   Not on file   Social Determinants of Health   Financial Resource Strain: Low Risk  (03/23/2023)   Overall Financial Resource Strain (CARDIA)    Difficulty of Paying Living Expenses: Not hard at all  Food Insecurity: No Food Insecurity (03/23/2023)   Hunger Vital Sign    Worried About Running Out of Food in the Last Year: Never true    Ran Out of Food in the Last Year: Never true  Transportation Needs: No Transportation Needs (03/23/2023)   PRAPARE - Administrator, Civil Service (Medical): No  Lack of  Transportation (Non-Medical): No  Physical Activity: Insufficiently Active (03/23/2023)   Exercise Vital Sign    Days of Exercise per Week: 1 day    Minutes of Exercise per Session: 10 min  Stress: No Stress Concern Present (03/23/2023)   [employer] of Occupational Health - Occupational Stress Questionnaire    Feeling of Stress : Only a little  Social Connections: Socially Integrated (03/23/2023)   Social Connection and Isolation Panel [NHANES]    Frequency of Communication with Friends and Family: Three times a week    Frequency of Social Gatherings with Friends and Family: Once a week    Attends Religious Services: More than 4 times per year    Active Member of [employer] or Organizations: Yes    Attends Engineer, structural: More than 4 times per year    Marital Status: Married    Tobacco Counseling Counseling given: Not Answered   Clinical Intake:  Pre-visit preparation completed: Yes  Pain : No/denies pain  Diabetes: No  How often do you need to have someone help you when you read instructions, pamphlets, or other written materials from your doctor or pharmacy?: 1 - Never  Interpreter Needed?: No  Information entered by :: Kandis Fantasia LPN   Activities of Daily Living    03/23/2023    1:44 PM 03/22/2023    1:42 PM  In your present state of health, do you have any difficulty performing the following activities:  Hearing? 0 0  Vision? 1 1  Difficulty concentrating or making decisions? 0 0  Walking or climbing stairs? 1 1  Dressing or bathing? 0 0  Doing errands, shopping? 1 1  Preparing Food and eating ? N N  Using the Toilet? N N  In the past six months, have you accidently leaked urine? Y Y  Do you have problems with loss of bowel control? N N  Managing your Medications? N N  Managing your Finances? N N  Housekeeping or managing your Housekeeping? N N    Patient Care Team: Donita Brooks, MD as PCP - General (Family Medicine) Tonny Bollman, MD as PCP - Cardiology (Cardiology) Griselda Miner, MD as Attending Physician (General Surgery) Clance, Maree Krabbe, MD as Attending Physician (Pulmonary Disease) Delight Ovens, MD (Inactive) as Consulting Physician (Cardiothoracic Surgery) Thayer Jew, MD as Consulting Physician (Hematology and Oncology) Kirtland Bouchard, MD as Referring Physician (Hematology and Oncology)  Indicate any recent Medical Services you may have received from other than [facility] providers in the past year (date may be approximate).     Assessment:   This is a routine wellness examination for [place].  Hearing/Vision screen Hearing Screening - Comments:: Denies hearing difficulties   Vision Screening - Comments:: No vision problems; will schedule routine eye exam soon    Dietary issues and exercise activities discussed:     Goals Addressed             This Visit's Progress    Exercise 3x per week (30 min per time)   Not on track     Depression Screen    03/23/2023    1:42 PM 05/11/2022   12:11 PM 03/04/2022   11:16 AM 03/07/2018    3:10 PM  PHQ 2/9 Scores  PHQ - 2 Score 0 1 0 1    Fall Risk    03/23/2023    1:43 PM 03/22/2023    1:42 PM 05/11/2022   12:12 PM 03/04/2022  11:17 AM  Fall Risk   Falls in the past year? 0 0 0 0  Number falls in past yr: 0 0 0 0  Injury with Fall? 0 0 0 0  Risk for fall due to : No Fall Risks  No Fall Risks No Fall Risks  Follow up Falls prevention discussed;Education provided;Falls evaluation completed  Falls prevention discussed Falls prevention discussed    MEDICARE RISK AT HOME:  Medicare Risk at Home - 03/23/23 1343     Any stairs in or around the home? Yes    If so, are there any without handrails? Yes    Home free of loose throw rugs in walkways, pet beds, electrical cords, etc? Yes    Adequate lighting in your home to reduce risk of falls? Yes    Life alert? No    Use of a cane, walker or w/c? Yes    Grab bars in the bathroom?  No    Shower chair or bench in shower? No    Elevated toilet seat or a handicapped toilet? No             TIMED UP AND GO:  Was the test performed?  No    Cognitive Function:        03/23/2023    1:44 PM 03/04/2022   11:18 AM  6CIT Screen  What Year? 0 points 0 points  What month? 0 points 0 points  What time? 0 points 0 points  Count back from 20 0 points 0 points  Months in reverse 2 points 0 points  Repeat phrase 0 points 0 points  Total Score 2 points 0 points    Immunizations Immunization History  Administered Date(s) Administered   PFIZER Comirnaty(Gray Top)Covid-19 Tri-Sucrose Vaccine 12/22/2020, 01/26/2021    TDAP status: Due, Education has been provided regarding the importance of this vaccine. Advised may receive this vaccine at local pharmacy or Health Dept. Aware to provide a copy of the vaccination record if obtained from local pharmacy or Health Dept. Verbalized acceptance and understanding.  Pneumococcal vaccine status: Due, Education has been provided regarding the importance of this vaccine. Advised may receive this vaccine at local pharmacy or Health Dept. Aware to provide a copy of the vaccination record if obtained from local pharmacy or Health Dept. Verbalized acceptance and understanding.  Covid-19 vaccine status: Declined, Education has been provided regarding the importance of this vaccine but patient still declined. Advised may receive this vaccine at local pharmacy or Health Dept.or vaccine clinic. Aware to provide a copy of the vaccination record if obtained from local pharmacy or Health Dept. Verbalized acceptance and understanding.  Qualifies for Shingles Vaccine? Yes   Zostavax completed No   Shingrix Completed?: No.    Education has been provided regarding the importance of this vaccine. Patient has been advised to call insurance company to determine out of pocket expense if they have not yet received this vaccine. Advised may also receive  vaccine at local pharmacy or Health Dept. Verbalized acceptance and understanding.  Screening Tests Health Maintenance  Topic Date Due   FOOT EXAM  Never done   HIV Screening  Never done   Diabetic kidney evaluation - Urine ACR  Never done   Hepatitis C Screening  Never done   DTaP/Tdap/Td (1 - Tdap) Never done   Zoster Vaccines- Shingrix (1 of 2) Never done   HEMOGLOBIN A1C  07/05/2015   PAP SMEAR-Modifier  11/06/2017   COVID-19 Vaccine (3 - Pfizer risk series)  02/23/2021   Diabetic kidney evaluation - eGFR measurement  05/22/2021   OPHTHALMOLOGY EXAM  11/16/2021   DEXA SCAN  Never done   Lung Cancer Screening  03/22/2024 (Originally 10/08/2017)   Pneumonia Vaccine 46+ Years old (1 of 2 - PCV) 03/22/2024 (Originally 03/14/1962)   INFLUENZA VACCINE  04/06/2023   MAMMOGRAM  01/14/2024   Medicare Annual Wellness (AWV)  03/22/2024   Fecal DNA (Cologuard)  03/18/2025   HPV VACCINES  Aged Out   Colonoscopy  Discontinued    Health Maintenance  Health Maintenance Due  Topic Date Due   FOOT EXAM  Never done   HIV Screening  Never done   Diabetic kidney evaluation - Urine ACR  Never done   Hepatitis C Screening  Never done   DTaP/Tdap/Td (1 - Tdap) Never done   Zoster Vaccines- Shingrix (1 of 2) Never done   HEMOGLOBIN A1C  07/05/2015   PAP SMEAR-Modifier  11/06/2017   COVID-19 Vaccine (3 - Pfizer risk series) 02/23/2021   Diabetic kidney evaluation - eGFR measurement  05/22/2021   OPHTHALMOLOGY EXAM  11/16/2021   DEXA SCAN  Never done    Colorectal cancer screening: Type of screening: Cologuard. Completed 03/18/22. Repeat every 3 years  Mammogram status: Ordered today. Pt provided with contact info and advised to call to schedule appt.   Bone Density status: Ordered today. Pt provided with contact info and advised to call to schedule appt.  Lung Cancer Screening: (Low Dose CT Chest recommended if Age 79-80 years, 20 pack-year currently smoking OR have quit w/in 15years.) does  qualify.   Lung Cancer Screening Referral: will discuss with pcp   Additional Screening:  Hepatitis C Screening: does qualify  Vision Screening: Recommended annual ophthalmology exams for early detection of glaucoma and other disorders of the eye. Is the patient up to date with their annual eye exam?  No  Who is the provider or what is the name of the office in which the patient attends annual eye exams? none If pt is not established with a provider, would they like to be referred to a provider to establish care? No .   Dental Screening: Recommended annual dental exams for proper oral hygiene  Diabetic Foot Exam: Diabetic Foot Exam: Overdue, Pt has been advised about the importance in completing this exam. Pt is scheduled for diabetic foot exam on at next office visit.  Community Resource Referral / Chronic Care Management: CRR required this visit?  No   CCM required this visit?  No     Plan:     I have personally reviewed and noted the following in the patient's chart:   Medical and social history Use of alcohol, tobacco or illicit drugs  Current medications and supplements including opioid prescriptions. Patient is not currently taking opioid prescriptions. Functional ability and status Nutritional status Physical activity Advanced directives List of other physicians Hospitalizations, surgeries, and ER visits in previous 12 months Vitals Screenings to include cognitive, depression, and falls Referrals and appointments  In addition, I have reviewed and discussed with patient certain preventive protocols, quality metrics, and best practice recommendations. A written personalized care plan for preventive services as well as general preventive health recommendations were provided to patient.     Kandis Fantasia [place], [place]   11/10/6576   After Visit Summary: (MyChart) Due to this being a telephonic visit, the after visit summary with patients personalized plan was  offered to patient via MyChart   Nurse Notes: See telephone note

## 2023-03-24 ENCOUNTER — Ambulatory Visit
Admission: RE | Admit: 2023-03-24 | Discharge: 2023-03-24 | Disposition: A | Discharge status: Home / Self Care | Payer: Medicare Other | Source: Ambulatory Visit | Attending: Family Medicine | Admitting: Family Medicine

## 2023-03-24 DIAGNOSIS — Z780 Asymptomatic menopausal state: Secondary | ICD-10-CM

## 2023-03-27 ENCOUNTER — Ambulatory Visit: Payer: Medicare Other | Attending: Cardiovascular Disease | Admitting: Cardiovascular Disease

## 2023-03-27 ENCOUNTER — Encounter: Payer: Self-pay | Admitting: Cardiovascular Disease

## 2023-03-27 VITALS — BP 112/80 | HR 62 | Ht 68.0 in | Wt 221.6 lb

## 2023-03-27 DIAGNOSIS — I10 Essential (primary) hypertension: Secondary | ICD-10-CM | POA: Diagnosis not present

## 2023-03-27 DIAGNOSIS — I25708 Atherosclerosis of coronary artery bypass graft(s), unspecified, with other forms of angina pectoris: Secondary | ICD-10-CM

## 2023-03-27 DIAGNOSIS — E782 Mixed hyperlipidemia: Secondary | ICD-10-CM

## 2023-03-27 MED ORDER — NITROGLYCERIN 0.4 MG SL SUBL
SUBLINGUAL_TABLET | SUBLINGUAL | 6 refills | Status: DC
Start: 2023-03-27 — End: 2023-08-16

## 2023-03-27 NOTE — Patient Instructions (Signed)
Medication Instructions:  Refilled Nitroglycerin SL tabs *If you need a refill on your cardiac medications before your next appointment, please call your pharmacy*   Lab Work: NONE If you have labs (blood work) drawn today and your tests are completely normal, you will receive your results only by: MyChart Message (if you have MyChart) OR A paper copy in the mail If you have any lab test that is abnormal or we need to change your treatment, we will call you to review the results.  Testing/Procedures: NONE  Follow-Up: At [facility], you and your health needs are our priority.  As part of our continuing mission to provide you with exceptional heart care, we have created designated Provider Care Teams.  These Care Teams include your primary Cardiologist (physician) and Advanced Practice Providers (APPs -  Physician Assistants and Nurse Practitioners) who all work together to provide you with the care you need, when you need it.    Your next appointment:   6 month(s)  Provider:   Tonny Bollman, MD

## 2023-03-27 NOTE — Progress Notes (Signed)
Cardiology Office Note:    Date:  03/27/2023   ID:  Sheri Hernandez, DOB 1955-11-20, MRN [id number]  PCP:  Donita Brooks, MD   [facility] Providers Cardiologist:  Tonny Bollman, MD     Referring MD: Donita Brooks, MD   Chief Complaint  Patient presents with   Coronary Artery Disease    History of Present Illness:    Sheri Hernandez is a 67 y.o. female with a hx of  CAD, presenting for follow-up evaluation. The patient has a complicated history, initially presenting with non-STEMI in 2016 and undergoing complex PCI of severe stenosis in the RCA complicated by extensive dissection requiring emergency CABG with the saphenous vein graft to PDA and PLA.  The patient has had recurrent angina and follow-up catheterization demonstrated occlusion of her bypass graft.  The RCA remained patent with tight residual stenosis and medical therapy was recommended in the setting of controlled symptoms.  Comorbid conditions include history of metastatic renal cell carcinoma status post left nephrectomy, breast cancer status post left lumpectomy, hypertension, and mixed hyperlipidemia.   The patient is here alone today.  She has recently returned from a trip to [place] where she spent about a month traveling through [place], [place], and [place].  She did well on her trip and did not have any anginal symptoms until returning home where she had to use 1 sublingual nitroglycerin a few days ago.  She otherwise has not had any recent chest pain or pressure.  Her leg swelling is improved since starting spironolactone at the time of her last visit.  No orthopnea or PND.  Patient has mild dyspnea with heavy exertion, otherwise no concerns reported today.  She has had some difficulty with glycemic control.  Past Medical History:  Diagnosis Date   Acute blood loss anemia 01/12/2015   Adenocarcinoma, renal cell (HCC) 11/12/2012   Overview:   09/25/12 - CXR - lung masses  CT CAP - rt  hilar mass, mult pulm nodules, LLL mass 3 x 3.1 cm, RLL mass 2.2 x 2.0 cm, left kidney mass 12.4 x 11.1 x 10.3 cm  10/05/12 PET - left lung mass 30 mm SUV 16.7, rt lung nodule 21 mm, SUV 10.4, left kidney mass 7 cm SUV 18, mass compressing RLL bronchus  10/11/12 lung biopsy - atypical cells  10/23/12 left kidney biopsy - high gr clear cell RCC    Anxiety    Arthritis    "maybe in my right hand" (05/26/2017)   CAD (coronary artery disease)    a. 12/2014 Cath: LM nl, LAD 20p, 30m, D2 20ost, LCX 95p, 9m, RCA dominant, 95p, 45m/d, RPDA 95, RPL2 50, attempted RCA PCI w/ acute RCA occlusion-->CABG x 2 VG->RPDA->PLVB.   Chronic diastolic CHF (congestive heart failure) (HCC)    Drug-induced hypothyroidism 03/13/2014   GERD (gastroesophageal reflux disease) 03/18/2015   Hyperglycemia 01/12/2015   Hypertension    Hypoalbuminemia    Hypothyroidism    Lower extremity edema    Lung mass    LUNG MASSES--COUGH-BIOPSY NON-DIAGNOSTIC.  HX OF MEDIASTINAL MASS AND LUNG MASSES AGE 35 -NEGATIVE BIOSPY-BUT GIVEN DIAGNOSIS OF SARCOIDOSIS.  WORK UP CONTINUING ON DIAGNOSIS FOR PT'S LUNG MASSES--SHE HAS BIOPSY PROVEN KIDNEY CANCER   Mass of left breast 08/01/2011   Myocardial infarction (HCC) 12/2014 X 2   Pain    HX OF EPISODES OF BREIF PAIN BACK OF HEAD-RIGHT SIDE- OCCURS WHEN PT TURNS HER HEAD TO RIGHT--BUT DOESN'T HAPPEN EVERY TIME SHE  TURNS HER HEAD TO RIGHT--SHE HAS HAD ALL HER LIFE AND STATES PAST WORK [employer]- NEGATIVE FOR ANY BRAIN ISSUES.   Papilloma of breast 2012   left. lumpectomy   Pulmonary nodules    Renal cell cancer (HCC)    metastatic renal cell carcinoma (biopsy of left renal mass 10/2012) On nivolumab at [facility]   Renal mass 10/01/2012   10/23/12 Bx Pos high grad clear cell renal cell ca> referred to oncology 10/26/2012     S/p nephrectomy    left, due to metastatic RCC, 3/14    Past Surgical History:  Procedure Laterality Date   BREAST BIOPSY  08/18/2011   Procedure: BREAST BIOPSY WITH NEEDLE  LOCALIZATION;  Surgeon: Robyne Askew, MD;  Location: [facility] OR;  Service: General;  Laterality: Left;  left breast biopsy with needle localization   BRONCHIAL BRUSH BIOPSY  2014   CARDIAC CATHETERIZATION     CORONARY ARTERY BYPASS GRAFT N/A 01/02/2015   Procedure: CORONARY ARTERY BYPASS GRAFTING (CABG)TIMES TWO USING RIGHT GREATER SAPHENOUS LEG VEIN HARVESTED ENDOSCOPICALLY;  Surgeon: Delight Ovens, MD;  Location[facility] OR;  Service: Open Heart Surgery;  Laterality: N/A;   LAPAROSCOPIC NEPHRECTOMY Left 11/22/2012   Procedure: LAPAROSCOPIC NEPHRECTOMY;  Surgeon: Crecencio Mc, MD;  Location: [facility] ORS;  Service: Urology;  Laterality: Left;   LEFT HEART CATH AND CORS/GRAFTS ANGIOGRAPHY N/A 10/11/2016   Procedure: Left Heart Cath and Cors/Grafts Angiography;  Surgeon: Tonny Bollman, MD;  Location: [facility] INVASIVE CV LAB;  Service: Cardiovascular;  Laterality: N/A;   LEFT HEART CATHETERIZATION WITH CORONARY ANGIOGRAM N/A 01/02/2015   Procedure: LEFT HEART CATHETERIZATION WITH CORONARY ANGIOGRAM;  Surgeon: Marykay Lex, MD;  Location[facility] CATH LAB;  Service: Cardiovascular;  Laterality: N/A;   NECK LESION BIOPSY Left 08/2016   PORTA CATH INSERTION Right ~ 2017   TONSILLECTOMY  1967   VIDEO BRONCHOSCOPY  10/11/2012   Procedure: VIDEO BRONCHOSCOPY WITH FLUORO;  Surgeon: Barbaraann Share, MD;  Location: [facility] ENDOSCOPY;  Service: Cardiopulmonary;  Laterality: Bilateral;   WIDS     WISDOM TOOTH EXTRACTION     "all 4"    Current Medications: Current Meds  Medication Sig   acetaminophen (TYLENOL) 500 MG tablet Take 500-1,000 mg by mouth every 6 (six) hours as needed for mild pain or headache.   aspirin 81 MG EC tablet Take 1 tablet (81 mg total) by mouth daily.   atorvastatin (LIPITOR) 40 MG tablet TAKE 1 TABLET BY MOUTH EVERY DAY   Blood Glucose Monitoring Suppl (BLOOD GLUCOSE SYSTEM PAK) KIT Please dispense based on patient and insurance preference. Use as directed to monitor FSBS 4x daily due to uncontrolled  diabetes. Dx: E11.65   Cholecalciferol (VITAMIN D-3) 25 MCG (1000 UT) CAPS Take 1,000 Units by mouth daily with breakfast.   clopidogrel (PLAVIX) 75 MG tablet TAKE 1 TABLET BY MOUTH EVERY DAY   Continuous Blood Gluc Receiver (FREESTYLE LIBRE 2 READER) DEVI    Continuous Blood Gluc Sensor (FREESTYLE LIBRE 3 SENSOR) MISC SMARTSIG:1 Topical Every 2 Weeks   glipiZIDE (GLUCOTROL XL) 10 MG 24 hr tablet TAKE 1 TABLET (10 MG TOTAL) BY MOUTH DAILY WITH BREAKFAST. (Patient taking differently: Take 20 mg by mouth daily with breakfast.)   Glucose Blood (BLOOD GLUCOSE TEST STRIPS) STRP Please dispense based on patient and insurance preference. Use as directed to monitor FSBS 4x daily due to uncontrolled diabetes. Dx: E11.65   isosorbide mononitrate (IMDUR) 60 MG 24 hr tablet TAKE 2 TABLETS BY MOUTH DAILY.  Lancets MISC Please dispense based on patient and insurance preference. Use as directed to monitor FSBS 4x daily due to uncontrolled diabetes. Dx: E11.65   levothyroxine (SYNTHROID) 125 MCG tablet Take 1 tablet by mouth daily.   lidocaine-prilocaine (EMLA) cream Apply 1 application topically See admin instructions. APPLY CREAM TO PORTACATH SITE APPROXIMATELY 1 HOUR BEFORE ACCESS AND COVER   losartan (COZAAR) 100 MG tablet TAKE 1 TABLET BY MOUTH EVERY DAY   metoprolol tartrate (LOPRESSOR) 100 MG tablet TAKE 1 TABLET BY MOUTH TWICE A DAY   ondansetron (ZOFRAN ODT) 4 MG disintegrating tablet Take 1 tablet (4 mg total) by mouth every 8 (eight) hours as needed for nausea or vomiting.   spironolactone (ALDACTONE) 25 MG tablet Take 1 tablet (25 mg total) by mouth daily.   [DISCONTINUED] nitroGLYCERIN (NITROSTAT) 0.4 MG SL tablet Place 1 tablet (0.4 mg total) under the tongue every 5 (five) minutes as needed for chest pain.     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   Tobacco Use   Smoking status: Former    Current packs/day: 0.00    Average packs/day: 1.5 packs/day for 40.0 years (60.0 ttl pk-yrs)    Types: Cigarettes    Start date: 09/24/1972    Quit date: 09/24/2012    Years since quitting: 10.5   Smokeless tobacco: Never  Vaping Use   Vaping status: Never Used  Substance and Sexual Activity   Alcohol use: Yes    Comment: 05/26/2017 "couple drinks/year"   Drug use: No   Sexual activity: Yes  Other Topics Concern   Not on file  Social History Narrative   Not on file   Social Determinants of Health   Financial Resource Strain: Low Risk  (03/23/2023)   Overall Financial Resource Strain (CARDIA)    Difficulty of Paying Living Expenses: Not hard at all  Food Insecurity: No Food Insecurity (03/23/2023)   Hunger Vital Sign    Worried About Running Out of Food in the Last Year: Never true    Ran Out of Food in the Last Year: Never true  Transportation Needs: No Transportation Needs (03/23/2023)   PRAPARE - Administrator, Civil Service (Medical): No    Lack of Transportation (Non-Medical): No  Physical Activity: Insufficiently Active (03/23/2023)   Exercise Vital Sign    Days of Exercise per Week: 1 day    Minutes of Exercise per Session: 10 min  Stress: No Stress Concern Present (03/23/2023)   [employer] of Occupational Health - Occupational Stress Questionnaire    Feeling of Stress : Only a little  Social Connections: Socially Integrated (03/23/2023)   Social Connection and Isolation Panel [NHANES]    Frequency of Communication with Friends and Family: Three times a week    Frequency of Social Gatherings with Friends and Family: Once a week    Attends Religious Services: More than 4 times per year    Active Member of [employer] or Organizations: Yes    Attends Engineer, structural: More than 4  times per year    Marital Status: Married     Family History: The patient's family history includes Aortic dissection  in her brother; Breast cancer in her maternal aunt; Cancer in her maternal grandfather; Colon cancer in her maternal aunt; Heart attack in her father; Heart disease in her brother, father, maternal grandmother, and mother; Skin cancer in her maternal uncle; Stomach cancer in her maternal uncle; Stroke in her brother.  ROS:   Please see the history of present illness.    All other systems reviewed and are negative.  EKGs/Labs/Other Studies Reviewed:          Recent Labs: No results found for requested labs within last 365 days.  Recent Lipid Panel    Component Value Date/Time   CHOL 90 01/03/2015 0210   TRIG 77 01/03/2015 0210   HDL 15 (L) 01/03/2015 0210   CHOLHDL 6.0 01/03/2015 0210   VLDL 15 01/03/2015 0210   LDLCALC 60 01/03/2015 0210     Risk Assessment/Calculations:                Physical Exam:    VS:  BP 112/80   Pulse 62   Ht 5\' 8"  (1.727 m)   Wt 221 lb 9.6 oz (100.5 kg)   SpO2 96%   BMI 33.69 kg/m     Wt Readings from Last 3 Encounters:  03/27/23 221 lb 9.6 oz (100.5 kg)  03/23/23 224 lb (101.6 kg)  09/22/22 224 lb (101.6 kg)     GEN:  Well nourished, well developed in no acute distress HEENT: Normal NECK: No JVD; No carotid bruits LYMPHATICS: No lymphadenopathy CARDIAC: RRR, 2/6 early peaking systolic ejection murmur at the right upper sternal border RESPIRATORY:  Clear to auscultation without rales, wheezing or rhonchi  ABDOMEN: Soft, non-tender, non-distended MUSCULOSKELETAL: Trace bilateral pretibial edema; No deformity  SKIN: Warm and dry NEUROLOGIC:  Alert and oriented x 3 PSYCHIATRIC:  Normal affect   ASSESSMENT:    1. Mixed hyperlipidemia   2. Coronary artery disease involving coronary bypass graft of native heart with other forms of angina pectoris (HCC)   3. Essential hypertension    PLAN:    In order of  problems listed above:  LDL is 66, reviewed through labs in care everywhere.  Patient with low HDL cholesterol of 28 and hypertriglyceridemia with triglyceride level of 215.  Continue atorvastatin 40 mg daily.  Needs to work on glycemic control.  We discussed this at length today. Stable angina reported.  She is using much less nitroglycerin than in the past.  She prefers sublingual however nitroglycerin spray so we will change her prescription.  Otherwise continue current management with isosorbide, aspirin, clopidogrel, and metoprolol. Home blood pressure readings have been erratic.  She has some very high readings with use of a wrist cuff.  Her office readings have been in a good range.  I rechecked her blood pressure today and obtained a reading of 130/72.  Her initial reading here was 112/80.  I do not think her home cuff is accurate.  I advised her to obtain an upper arm cuff for home monitoring as needed.  Continue spironolactone, metoprolol, and isosorbide as she is doing.      Medication Adjustments/Labs and Tests Ordered: Current medicines are reviewed at length with the patient today.  Concerns regarding medicines are outlined above.  No orders of the defined types were placed in this encounter.  Meds ordered this encounter  Medications   nitroGLYCERIN (NITROSTAT) 0.4 MG SL tablet    Sig: Dissolve 1 tablet under the tongue every 5 minutes as needed for chest pain. Max of  3 doses, then 911.    Dispense:  50 tablet    Refill:  6    Patient Instructions  Medication Instructions:  Refilled Nitroglycerin SL tabs *If you need a refill on your cardiac medications before your next appointment, please call your pharmacy*   Lab Work: NONE If you have labs (blood work) drawn today and your tests are completely normal, you will receive your results only by: MyChart Message (if you have MyChart) OR A paper copy in the mail If you have any lab test that is abnormal or we need to change  your treatment, we will call you to review the results.  Testing/Procedures: NONE  Follow-Up: At [facility], you and your health needs are our priority.  As part of our continuing mission to provide you with exceptional heart care, we have created designated Provider Care Teams.  These Care Teams include your primary Cardiologist (physician) and Advanced Practice Providers (APPs -  Physician Assistants and Nurse Practitioners) who all work together to provide you with the care you need, when you need it.    Your next appointment:   6 month(s)  Provider:   Tonny Bollman, MD        Signed, Tonny Bollman, MD  03/27/2023 1:13 PM    [facility]

## 2023-04-05 ENCOUNTER — Ambulatory Visit: Payer: Medicare Other

## 2023-04-06 ENCOUNTER — Ambulatory Visit
Admission: RE | Admit: 2023-04-06 | Discharge: 2023-04-06 | Disposition: A | Discharge status: Home / Self Care | Payer: Medicare Other | Source: Ambulatory Visit | Attending: Family Medicine | Admitting: Family Medicine

## 2023-04-06 ENCOUNTER — Telehealth: Payer: Self-pay | Admitting: Cardiovascular Disease

## 2023-04-06 DIAGNOSIS — Z1231 Encounter for screening mammogram for malignant neoplasm of breast: Secondary | ICD-10-CM

## 2023-04-06 NOTE — Telephone Encounter (Signed)
Pt c/o medication issue:  1. Name of Medication: Jardiance  2. How are you currently taking this medication (dosage and times per day)?   3. Are you having a reaction (difficulty breathing--STAT)?   4. What is your medication issue? Patient said her other doctor wants her to take this medicine and another medicine. She wants to talk to the nurse about her taking these medicines.

## 2023-04-06 NOTE — Telephone Encounter (Signed)
Pt concerned -- MD wants to start her on both Jardiance and Januvia (pharm is filling [place] b/c they do not have Januvia) on top of the glipizide she is taking for her diabetes. She is calling concerned about how starting these will affect her heart and if Dr. Excell Seltzer thinks it is safe/ok for her to start them. She is also concerned how the medication might affect/worsen her kidney disease and/or heart. (She googled the meds)  Pt educated that London Pepper will be a positive for both her heart and kidney. Januvia is neutral when it comes to her heart.  Informed that I would still forward this to Dr. Excell Seltzer per her request, for his advisement on the matter. Aware his nurse will follow up with her once he reviews. Pt agreeable to plan.

## 2023-04-10 NOTE — Telephone Encounter (Signed)
Sheri Hernandez is excellent - aside from glucose lowering, it also provides added cardiovascular and renal benefits.  Januvia is effective for glucose lowering but doesn't provide any secondary or tertiary benefits. Once weekly GLP like Mounjaro would be more effective for glucose lowering while provided added cardiovascular and weight loss benefits. Reviewed endo note from 7/30 and looks like she was against injectable medication which is likely why Januvia was picked instead. Her A1c was very high at 11.5% last week so the addition of 2 meds for her DM is definitely warranted.

## 2023-04-10 NOTE — Telephone Encounter (Signed)
Spoke with patient and shared response from pharmacist:  Sheri Hernandez is excellent - aside from glucose lowering, it also provides added cardiovascular and renal benefits.   Januvia is effective for glucose lowering but doesn't provide any secondary or tertiary benefits. Once weekly GLP like Mounjaro would be more effective for glucose lowering while provided added cardiovascular and weight loss benefits. Reviewed endo note from 7/30 and looks like she was against injectable medication which is likely why Januvia was picked instead. Her A1c was very high at 11.5% last week so the addition of 2 meds for her DM is definitely warranted.        Patient verbalized understanding and states she will discuss with her endocrinologist. She expressed appreciation for follow-up and information provided.

## 2023-04-17 ENCOUNTER — Other Ambulatory Visit: Payer: Self-pay | Admitting: Cardiovascular Disease

## 2023-05-01 ENCOUNTER — Other Ambulatory Visit: Payer: Self-pay | Admitting: Family Medicine

## 2023-05-02 NOTE — Telephone Encounter (Signed)
Requested medication (s) are due for refill today: routing for review  Requested medication (s) are on the active medication list: yes  Last refill:  03/23/23  Future visit scheduled: no  Notes to clinic:  Medication not assigned to a protocol, review manually.      Requested Prescriptions  Pending Prescriptions Disp Refills   fluconazole (DIFLUCAN) 150 MG tablet [Pharmacy Med Name: FLUCONAZOLE 150 MG TABLET] 1 tablet 0    Sig: Take 1 tablet (150 mg total) by mouth once for 1 dose.     Off-Protocol Failed - 05/01/2023  9:20 AM      Failed - Medication not assigned to a protocol, review manually.      Failed - Valid encounter within last 12 months    Recent Outpatient Visits           1 year ago S/P CABG x 2   [facility] Tanya Nones, Priscille Heidelberg, MD   2 years ago Hx: UTI (urinary tract infection)   [facility] Donita Brooks, MD   2 years ago Controlled type 2 diabetes mellitus with complication, without long-term current use of insulin (HCC)   [facility] Pickard, Priscille Heidelberg, MD   2 years ago Polyuria   [facility] Donita Brooks, MD   2 years ago Viral upper respiratory tract infection   [facility] Pickard, Priscille Heidelberg, MD

## 2023-05-24 ENCOUNTER — Other Ambulatory Visit: Payer: Self-pay | Admitting: Cardiovascular Disease

## 2023-05-24 DIAGNOSIS — I2510 Atherosclerotic heart disease of native coronary artery without angina pectoris: Secondary | ICD-10-CM

## 2023-05-24 DIAGNOSIS — R002 Palpitations: Secondary | ICD-10-CM

## 2023-05-24 DIAGNOSIS — R609 Edema, unspecified: Secondary | ICD-10-CM

## 2023-05-26 ENCOUNTER — Other Ambulatory Visit: Payer: Self-pay | Admitting: Cardiovascular Disease

## 2023-06-08 ENCOUNTER — Encounter: Payer: Self-pay | Admitting: Family Medicine

## 2023-06-08 ENCOUNTER — Ambulatory Visit: Payer: Medicare Other | Admitting: Family Medicine

## 2023-06-08 VITALS — BP 124/72 | HR 78 | Temp 98.2°F | Ht 68.0 in | Wt 219.0 lb

## 2023-06-08 DIAGNOSIS — N898 Other specified noninflammatory disorders of vagina: Secondary | ICD-10-CM | POA: Diagnosis not present

## 2023-06-08 MED ORDER — FLUCONAZOLE 150 MG PO TABS: 150.0000 mg | ORAL_TABLET | Freq: Once | ORAL | 0 refills | Status: AC

## 2023-06-08 NOTE — Progress Notes (Signed)
Subjective:    Patient ID: Sheri Hernandez, female    DOB: 31-Jan-1956, 67 y.o.   MRN: [id number]  Vaginal Itching   Patient has a history of diabetes mellitus type 2.  Her endocrinologist recently started her on Jardiance 25 mg daily.  This occurred approximately 3 months ago.  Since that time, the patient complains of vaginal itching.  History is very difficult to obtain as patient feels very uncomfortable explaining her history and symptoms to me.  From what I gather, she has vaginal itching but no discharge.  She denies any rash.  She does complain about itching around the labia majora and labia minora.  She denies any dysuria.  She denies any urgency or frequency.  She denies any abdominal pain.  I treated the patient once before over the telephone with Diflucan.  Her endocrinologist has treated her second time with Diflucan however this time it did not improve.  Therefore I asked the patient to come in for a pelvic exam and to do a wet prep.  She refuses a pelvic exam.  She does not feel comfortable with me examining her.  I tried to explain to her that it is very difficult to treat her without being able to examine her.  There is a possibility for vaginosis or rarely cellulitis.  Patient continues to refuse a pelvic exam. Past Medical History:  Diagnosis Date   Acute blood loss anemia 01/12/2015   Adenocarcinoma, renal cell (HCC) 11/12/2012   Overview:   09/25/12 - CXR - lung masses  CT CAP - rt hilar mass, mult pulm nodules, LLL mass 3 x 3.1 cm, RLL mass 2.2 x 2.0 cm, left kidney mass 12.4 x 11.1 x 10.3 cm  10/05/12 PET - left lung mass 30 mm SUV 16.7, rt lung nodule 21 mm, SUV 10.4, left kidney mass 7 cm SUV 18, mass compressing RLL bronchus  10/11/12 lung biopsy - atypical cells  10/23/12 left kidney biopsy - high gr clear cell RCC    Anxiety    Arthritis    "maybe in my right hand" (05/26/2017)   CAD (coronary artery disease)    a. 12/2014 Cath: LM nl, LAD 20p, 58m, D2 20ost, LCX 95p, 69m,  RCA dominant, 95p, 42m/d, RPDA 95, RPL2 50, attempted RCA PCI w/ acute RCA occlusion-->CABG x 2 VG->RPDA->PLVB.   Chronic diastolic CHF (congestive heart failure) (HCC)    Drug-induced hypothyroidism 03/13/2014   GERD (gastroesophageal reflux disease) 03/18/2015   Hyperglycemia 01/12/2015   Hypertension    Hypoalbuminemia    Hypothyroidism    Lower extremity edema    Lung mass    LUNG MASSES--COUGH-BIOPSY NON-DIAGNOSTIC.  HX OF MEDIASTINAL MASS AND LUNG MASSES AGE 67 -NEGATIVE BIOSPY-BUT GIVEN DIAGNOSIS OF SARCOIDOSIS.  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   Papilloma of breast 2012   left. lumpectomy   Pulmonary nodules    Renal cell cancer (HCC)    metastatic renal cell carcinoma (biopsy of left renal mass 10/2012) On nivolumab at [facility]   Renal mass 10/01/2012   10/23/12 Bx Pos high grad  clear cell renal cell ca> referred to oncology 10/26/2012     S/p nephrectomy    left, due to metastatic RCC, 3/14   Past Surgical History:  Procedure Laterality Date   BREAST BIOPSY  08/18/2011   Procedure: BREAST BIOPSY WITH NEEDLE LOCALIZATION;  Surgeon: Robyne Askew, MD;  Location: [facility] OR;  Service: General;  Laterality: Left;  left breast biopsy with needle localization   BRONCHIAL BRUSH BIOPSY  2014   CARDIAC CATHETERIZATION     CORONARY ARTERY BYPASS GRAFT N/A 01/02/2015   Procedure: CORONARY ARTERY BYPASS GRAFTING (CABG)TIMES TWO USING RIGHT GREATER SAPHENOUS LEG VEIN HARVESTED ENDOSCOPICALLY;  Surgeon: Delight Ovens, MD;  Location: [facility] OR;  Service: Open Heart Surgery;  Laterality: N/A;   LAPAROSCOPIC NEPHRECTOMY Left 11/22/2012    Procedure: LAPAROSCOPIC NEPHRECTOMY;  Surgeon: Crecencio Mc, MD;  Location: [facility] ORS;  Service: Urology;  Laterality: Left;   LEFT HEART CATH AND CORS/GRAFTS ANGIOGRAPHY N/A 10/11/2016   Procedure: Left Heart Cath and Cors/Grafts Angiography;  Surgeon: Tonny Bollman, MD;  Location: [facility] INVASIVE CV LAB;  Service: Cardiovascular;  Laterality: N/A;   LEFT HEART CATHETERIZATION WITH CORONARY ANGIOGRAM N/A 01/02/2015   Procedure: LEFT HEART CATHETERIZATION WITH CORONARY ANGIOGRAM;  Surgeon: Marykay Lex, MD;  Location: [facility] CATH LAB;  Service: Cardiovascular;  Laterality: N/A;   NECK LESION BIOPSY Left 08/2016   PORTA CATH INSERTION Right ~ 2017   TONSILLECTOMY  1967   VIDEO BRONCHOSCOPY  10/11/2012   Procedure: VIDEO BRONCHOSCOPY WITH FLUORO;  Surgeon: Barbaraann Share, MD;  Location: [facility] ENDOSCOPY;  Service: Cardiopulmonary;  Laterality: Bilateral;   WIDS     WISDOM TOOTH EXTRACTION     "all 4"   Current Outpatient Medications on File Prior to Visit  Medication Sig Dispense Refill   acetaminophen (TYLENOL) 500 MG tablet Take 500-1,000 mg by mouth every 6 (six) hours as needed for mild pain or headache.     aspirin 81 MG EC tablet Take 1 tablet (81 mg total) by mouth daily.     atorvastatin (LIPITOR) 40 MG tablet TAKE 1 TABLET BY MOUTH EVERY DAY 90 tablet 3   Blood Glucose Monitoring Suppl (BLOOD GLUCOSE SYSTEM PAK) KIT Please dispense based on patient and insurance preference. Use as directed to monitor FSBS 4x daily due to uncontrolled diabetes. Dx: E11.65 1 kit 1   Cholecalciferol (VITAMIN D-3) 25 MCG (1000 UT) CAPS Take 1,000 Units by mouth daily with breakfast.     clopidogrel (PLAVIX) 75 MG tablet TAKE 1 TABLET BY MOUTH EVERY DAY 90 tablet 3   Continuous Blood Gluc Receiver (FREESTYLE LIBRE 2 READER) DEVI      Continuous Blood Gluc Sensor (FREESTYLE LIBRE 3 SENSOR) MISC SMARTSIG:1 Topical Every 2 Weeks 1 each 5   empagliflozin (JARDIANCE) 10 MG TABS tablet Take by mouth daily.     glipiZIDE  (GLUCOTROL XL) 10 MG 24 hr tablet TAKE 1 TABLET (10 MG TOTAL) BY MOUTH DAILY WITH BREAKFAST. (Patient taking differently: Take 20 mg by mouth daily with breakfast.) 90 tablet 1   Glucose Blood (BLOOD GLUCOSE TEST STRIPS) STRP Please dispense based on patient and insurance preference. Use as directed to monitor FSBS 4x daily due to uncontrolled diabetes. Dx: E11.65 150 strip 11   isosorbide mononitrate (IMDUR) 60 MG 24 hr tablet TAKE 2 TABLETS BY MOUTH EVERY DAY 180 tablet 3   Lancets MISC Please dispense based on patient and insurance preference. Use as directed to monitor FSBS 4x daily due to uncontrolled diabetes.  Dx: E11.65 150 each 11   levothyroxine (SYNTHROID) 125 MCG tablet Take 1 tablet by mouth daily.     lidocaine-prilocaine (EMLA) cream Apply 1 application topically See admin instructions. APPLY CREAM TO PORTACATH SITE APPROXIMATELY 1 HOUR BEFORE ACCESS AND COVER     losartan (COZAAR) 100 MG tablet TAKE 1 TABLET BY MOUTH EVERY DAY 90 tablet 1   metoprolol tartrate (LOPRESSOR) 100 MG tablet TAKE 1 TABLET BY MOUTH TWICE A DAY 180 tablet 2   nitroGLYCERIN (NITROSTAT) 0.4 MG SL tablet Dissolve 1 tablet under the tongue every 5 minutes as needed for chest pain. Max of 3 doses, then 911. 50 tablet 6   ondansetron (ZOFRAN ODT) 4 MG disintegrating tablet Take 1 tablet (4 mg total) by mouth every 8 (eight) hours as needed for nausea or vomiting. 20 tablet 0   spironolactone (ALDACTONE) 25 MG tablet Take 1 tablet (25 mg total) by mouth daily. 90 tablet 3   No current facility-administered medications on file prior to visit.   Allergies  Allergen Reactions   Silver Dermatitis and Other (See Comments)    Please use Mepilex bandaging ("brown/tan, not silver")   Tape Other (See Comments)    PLEASE DO NOT USE "PLASTIC" TAPE!! 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   Tobacco Use   Smoking status: Former    Current packs/day: 0.00    Average packs/day: 1.5 packs/day for 40.0 years (60.0 ttl pk-yrs)    Types: Cigarettes    Start date: 09/24/1972    Quit date: 09/24/2012    Years since quitting: 10.7   Smokeless tobacco: Never  Vaping Use   Vaping status: Never Used  Substance and Sexual Activity   Alcohol use: Yes    Comment: 05/26/2017 "couple drinks/year"   Drug use: No   Sexual activity: Yes  Other Topics Concern   Not on file  Social History Narrative   Not on file   Social Determinants of Health   Financial Resource Strain: Low Risk  (03/23/2023)   Overall Financial Resource Strain (CARDIA)    Difficulty of Paying Living Expenses: Not hard at all  Food Insecurity: No Food Insecurity (03/23/2023)   Hunger Vital Sign    Worried About Running Out of Food in the Last Year: Never true    Ran Out of Food in the Last Year: Never true  Transportation Needs: No Transportation Needs (03/23/2023)   PRAPARE - Administrator, Civil Service (Medical): No    Lack of Transportation (Non-Medical): No  Physical Activity: Insufficiently Active (03/23/2023)   Exercise Vital Sign    Days of Exercise per Week: 1 day    Minutes of Exercise per Session: 10 min  Stress: No Stress Concern Present (03/23/2023)   [employer] of Occupational Health - Occupational Stress Questionnaire    Feeling of Stress : Only a little  Social Connections: Socially Integrated (03/23/2023)   Social Connection and Isolation Panel [NHANES]    Frequency of Communication with Friends and Family: Three times a week    Frequency of Social Gatherings with Friends and Family: Once a week    Attends Religious Services: More than 4 times  per year    Active Member of Clubs or Organizations: Yes    Attends Banker Meetings: More  than 4 times per year    Marital Status: Married  Catering manager Violence: Not At Risk (03/23/2023)   Humiliation, Afraid, Rape, and Kick questionnaire    Fear of Current or Ex-Partner: No    Emotionally Abused: No    Physically Abused: No    Sexually Abused: No      Review of Systems  All other systems reviewed and are negative.      Objective:   Physical Exam Vitals reviewed.  Constitutional:      Appearance: Normal appearance. She is normal weight.  Neurological:     General: No focal deficit present.     Mental Status: She is alert and oriented to person, place, and time.  Psychiatric:        Attention and Perception: Attention normal.        Behavior: Behavior normal.        Thought Content: Thought content normal.        Cognition and Memory: Cognition and memory normal.        Judgment: Judgment normal.           Assessment & Plan:  Itching in the vaginal area I explained to the patient that I do not know for sure what I am treating.  It is highly likely that she has a yeast infection however given her recurrent symptoms, I would like to rule out cellulitis, lichen sclerosus, BV, or even skin cancer.  She continues to refuse a pelvic exam AGAINST MEDICAL ADVICE.  I will treat the patient with Diflucan 150 mg once.  If symptoms do not improve I will have to examine her perineum.  I explained this to her.  I also recommended that she discuss discontinuation of Jardiance with her endocrinologist

## 2023-07-24 ENCOUNTER — Encounter: Payer: Medicare Other | Admitting: Advanced Practice Midwife

## 2023-07-31 ENCOUNTER — Telehealth: Payer: Self-pay

## 2023-07-31 NOTE — Patient Outreach (Signed)
Attempted to contact patient regarding care gaps. Left voicemail for patient to return my call at [phone].  Nicholes Rough, CMA Care Guide [employer]

## 2023-08-01 ENCOUNTER — Telehealth: Payer: Self-pay | Admitting: Cardiovascular Disease

## 2023-08-01 NOTE — Telephone Encounter (Signed)
Pt c/o medication issue:  1. Name of Medication:  Patient states she will call back with the name of the medication.  2. How are you currently taking this medication (dosage and times per day)?   3. Are you having a reaction (difficulty breathing--STAT)?   4. What is your medication issue?   Patient states Benedict Needy, NP switched her from [place] to another medication that she hasn't started taking yet. However, patient states she found out that the medication causes edema and heart failure. She would like to know if Dr. Excell Seltzer agrees with her taking it. She states she does not remember the name of the medication, but she plans to contact NP's office to find out the name of the medication.

## 2023-08-01 NOTE — Telephone Encounter (Signed)
Pt called back with name of medication Pilglitazone

## 2023-08-02 NOTE — Telephone Encounter (Signed)
Actos does not cause heart failure, but it can worsen symptoms. It can cause edema. It's not the best diabetes drug for people with cardiovascular disease but it looks like patient has limited options since she refused injectables. Does not look like pt has s/sx of HF and would be ok to take. She would be a great GLP-1 candidate if we could solve cost issue and she would be agreeable to weekly injections.

## 2023-08-02 NOTE — Telephone Encounter (Signed)
Called and spoke with patient. She feels like knowing that the Pioglitazone is not going to get her A1C down to where it needs to be and not good for cardiovascular health overall, she'd like to not take it. Explained the GLP-1 drugs to her and the need for weekly injections, of which they are pre-filled, easy to use, and would hopefully keep her from needing insulin or more drugs in the future. She was very receptive to the idea. Verbalizes that she didn't feel confident in speaking with her NP on this and would prefer we prescribe this for her. She also would like to have an appt here initially for this so that using the pen can be demonstrated and explained and she can feel confident in doing it in the future. Routing back to pharmD for next steps. She is leaving out of town today for thanksgiving and will be back home on 08/08/23.

## 2023-08-07 NOTE — Telephone Encounter (Signed)
Patient has apt 12/31 to discuss.

## 2023-08-15 ENCOUNTER — Other Ambulatory Visit: Payer: Self-pay | Admitting: Cardiovascular Disease

## 2023-08-15 DIAGNOSIS — I25708 Atherosclerosis of coronary artery bypass graft(s), unspecified, with other forms of angina pectoris: Secondary | ICD-10-CM

## 2023-09-05 ENCOUNTER — Ambulatory Visit: Payer: Medicare Other | Attending: Cardiology | Admitting: Pharmacist

## 2023-09-05 VITALS — Wt 227.0 lb

## 2023-09-05 DIAGNOSIS — E1165 Type 2 diabetes mellitus with hyperglycemia: Secondary | ICD-10-CM

## 2023-09-05 NOTE — Progress Notes (Signed)
 Patient ID: Sheri Hernandez                 DOB: [birth year]                    MRN: [id number]     HPI: Sheri Hernandez is a 67 y.o. female patient referred to pharmacy clinic by Dr. Wonda to initiate GLP1-RA therapy. PMH is significant for NSTEMI in 2016 followed by CABG,  metastatic renal cell carcinoma status post left nephrectomy, breast cancer status post left lumpectomy, hypertension, mixed hyperlipidemia, DM and obesity. Most recent BMI 34. Most recent A1C 9.5%  Baseline weight and BMI: 227lb in clinic pt reports 220 at home  She is on glipizide  10 mg twice a day.  No longer on Jardiance due to yeast infections.  She wears a CGM her blood sugar is typically around 350 sometimes down to 250.  She does not do any exercise.  Has taken sugar and bread and salts out of her house.  But sounds like she does eat out frequently, encouraged by her husband.  Has a free membership to the [facility].  Started chair yoga this month, has done it 3 times.  She refuses insulin .  Diet:  Breakfast: doesn't eat, hot tea- unless she goes out (breakfast plate) Lunch: eggs Dinner: eats out often, past premavera Drink: unsweet tea or hot tea  Exercise: none  Family History:  Family History  Problem Relation Age of Onset   Heart disease Mother    Heart disease Father    Heart attack Father    Cancer Maternal Grandfather    Heart disease Brother    Stroke Brother    Aortic dissection Brother    Colon cancer Maternal Aunt    Skin cancer Maternal Uncle    Stomach cancer Maternal Uncle    Heart disease Maternal Grandmother    Breast cancer Maternal Aunt      Social History: no ETOH recently, no tobacco  Labs: Lab Results  Component Value Date   HGBA1C 6.1 (H) 01/03/2015    Wt Readings from Last 1 Encounters:  06/08/23 219 lb (99.3 kg)    BP Readings from Last 1 Encounters:  06/08/23 124/72   Pulse Readings from Last 1 Encounters:  06/08/23 78       Component Value Date/Time    CHOL 90 01/03/2015 0210   TRIG 77 01/03/2015 0210   HDL 15 (L) 01/03/2015 0210   CHOLHDL 6.0 01/03/2015 0210   VLDL 15 01/03/2015 0210   LDLCALC 60 01/03/2015 0210    Past Medical History:  Diagnosis Date   Acute blood loss anemia 01/12/2015   Adenocarcinoma, renal cell (HCC) 11/12/2012   Overview:   09/25/12 - CXR - lung masses  CT CAP - rt hilar mass, mult pulm nodules, LLL mass 3 x 3.1 cm, RLL mass 2.2 x 2.0 cm, left kidney mass 12.4 x 11.1 x 10.3 cm  10/05/12 PET - left lung mass 30 mm SUV 16.7, rt lung nodule 21 mm, SUV 10.4, left kidney mass 7 cm SUV 18, mass compressing RLL bronchus  10/11/12 lung biopsy - atypical cells  10/23/12 left kidney biopsy - high gr clear cell RCC    Anxiety    Arthritis    maybe in my right hand (05/26/2017)   CAD (coronary artery disease)    a. 12/2014 Cath: LM nl, LAD 20p, 89m, D2 20ost, LCX 95p, 55m, RCA dominant, 95p, 27m/d, RPDA 95, RPL2 50, attempted  RCA PCI w/ acute RCA occlusion-->CABG x 2 VG->RPDA->PLVB.   Chronic diastolic CHF (congestive heart failure) (HCC)    Drug-induced hypothyroidism 03/13/2014   GERD (gastroesophageal reflux disease) 03/18/2015   Hyperglycemia 01/12/2015   Hypertension    Hypoalbuminemia    Hypothyroidism    Lower extremity edema    Lung mass    LUNG MASSES--COUGH-BIOPSY NON-DIAGNOSTIC.  HX OF MEDIASTINAL MASS AND LUNG MASSES AGE 89 -NEGATIVE BIOSPY-BUT GIVEN DIAGNOSIS OF SARCOIDOSIS.  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   Papilloma of breast 2012   left. lumpectomy   Pulmonary nodules    Renal cell cancer (HCC)    metastatic renal cell carcinoma (biopsy  of left renal mass 10/2012) On nivolumab  at [facility]   Renal mass 10/01/2012   10/23/12 Bx Pos high grad clear cell renal cell ca> referred to oncology 10/26/2012     S/p nephrectomy    left, due to metastatic RCC, 3/14    Current Outpatient Medications on File Prior to Visit  Medication Sig Dispense Refill   acetaminophen  (TYLENOL ) 500 MG tablet Take 500-1,000 mg by mouth every 6 (six) hours as needed for mild pain or headache.     aspirin  81 MG EC tablet Take 1 tablet (81 mg total) by mouth daily.     atorvastatin  (LIPITOR ) 40 MG tablet TAKE 1 TABLET BY MOUTH EVERY DAY 90 tablet 3   Blood Glucose Monitoring Suppl (BLOOD GLUCOSE SYSTEM PAK) KIT Please dispense based on patient and insurance preference. Use as directed to monitor FSBS 4x daily due to uncontrolled diabetes. Dx: E11.65 1 kit 1   Cholecalciferol (VITAMIN D-3) 25 MCG (1000 UT) CAPS Take 1,000 Units by mouth daily with breakfast.     clopidogrel  (PLAVIX ) 75 MG tablet TAKE 1 TABLET BY MOUTH EVERY DAY 90 tablet 3   Continuous Blood Gluc Receiver (FREESTYLE LIBRE 2 READER) DEVI      Continuous Blood Gluc Sensor (FREESTYLE LIBRE 3 SENSOR) MISC SMARTSIG:1 Topical Every 2 Weeks 1 each 5   empagliflozin (JARDIANCE) 10 MG TABS tablet Take by mouth daily.     glipiZIDE  (GLUCOTROL  XL) 10 MG 24 hr tablet TAKE 1 TABLET (10 MG TOTAL) BY MOUTH DAILY WITH BREAKFAST. (Patient taking differently: Take 20 mg by mouth daily with breakfast.) 90 tablet 1   Glucose Blood (BLOOD GLUCOSE TEST STRIPS) STRP Please dispense based on patient and insurance preference. Use as directed to monitor FSBS 4x daily due to uncontrolled diabetes. Dx: E11.65 150 strip 11   isosorbide  mononitrate (IMDUR ) 60 MG 24 hr tablet TAKE 2 TABLETS BY MOUTH EVERY DAY 180 tablet 3   Lancets MISC Please dispense based on patient and insurance preference. Use as directed to monitor FSBS 4x daily due to uncontrolled diabetes. Dx: E11.65 150 each 11   levothyroxine  (SYNTHROID ) 125 MCG tablet  Take 1 tablet by mouth daily.     lidocaine -prilocaine  (EMLA ) cream Apply 1 application topically See admin instructions. APPLY CREAM TO PORTACATH SITE APPROXIMATELY 1 HOUR BEFORE ACCESS AND COVER     losartan  (COZAAR ) 100 MG tablet TAKE 1 TABLET BY  MOUTH EVERY DAY 90 tablet 1   metoprolol  tartrate (LOPRESSOR ) 100 MG tablet TAKE 1 TABLET BY MOUTH TWICE A DAY 180 tablet 2   nitroGLYCERIN  (NITROSTAT ) 0.4 MG SL tablet PLACE 1 TABLET UNDER THE TONGUE EVERY 5 MINUTES AS NEEDED FOR CHEST PAIN. 25 tablet 3   ondansetron  (ZOFRAN  ODT) 4 MG disintegrating tablet Take 1 tablet (4 mg total) by mouth every 8 (eight) hours as needed for nausea or vomiting. 20 tablet 0   spironolactone  (ALDACTONE ) 25 MG tablet Take 1 tablet (25 mg total) by mouth daily. 90 tablet 3   No current facility-administered medications on file prior to visit.    Allergies  Allergen Reactions   Silver Dermatitis and Other (See Comments)    Please use Mepilex bandaging (brown/tan, not silver)   Tape Other (See Comments)    PLEASE DO NOT USE PLASTIC TAPE!! IT RIPS OFF MY SKIN     Assessment/Plan:  1. Weight loss - Patient has not met goal of at least 5% of body weight loss with comprehensive lifestyle modifications alone in the past 3-6 months. Pharmacotherapy is appropriate to pursue as augmentation. Will start Mounjaro  2.5mg  weekly. Confirmed patient not pregnant and no personal or family history of medullary thyroid  carcinoma (MTC) or Multiple Endocrine Neoplasia syndrome type 2 (MEN 2).  No history of pancreatitis or gallstones. Injection technique reviewed at today's visit.  Advised patient on common side effects including nausea, diarrhea, dyspepsia, decreased appetite, and fatigue. Counseled patient on reducing meal size and how to titrate medication to minimize side effects. Counseled patient to call if intolerable side effects or if experiencing dehydration, abdominal pain, or dizziness. Patient will adhere to dietary  modifications and will target at least 150 minutes of moderate intensity exercise weekly.   I strongly reinforced lifestyle changes that need to be made in order to complement GLP-1.  And her encouraged her to start going for walks.  Reminded her how much difference this could make in her blood sugar.  We also discussed that GLP-1's do not distinguish between fat and muscle therefore exercise is needed in order to preserve muscle mass.  We discussed setting small goals that are obtainable within a few weeks.  Encourage her to get her husband support.  Encouraged meal to be focused around protein both plant-based and lean meat and plenty of fiber, especially from vegetables.  Will submit prior authorization and run test claim after the new year since patient is currently in coverage gap.  Follow up in 1 month via telephone for tolerability update and dose titration.  Eldo Umanzor D Jaana Brodt, Pharm.D, BCACP, BCPS, CPP [facility] [address], [place], [place] [address]  Phone: [phone]; Fax: [phone]

## 2023-09-05 NOTE — Patient Instructions (Signed)
 Good resources:  The Nutrition Science Podcast with Dr. Yancy Horns  Dr. Magdalene Buba Show (podcast)  The Consistency Project (podcast)  Eat, Drink, and Be Healthy: The [employer] Guide to Healthy Eating (2007 version) (book)   GLP-1 Receptor Agonist Counseling Points This medication reduces your appetite and may make you feel fuller longer.  Stop eating when your body tells you that you are full. This will likely happen sooner than you are used to. Fried/greasy food and sweets may upset your stomach - minimize these as much as possible. Store your medication in the fridge until you are ready to use it. Inject your medication in the fatty tissue of your lower abdominal area (2 inches away from belly button) or upper outer thigh. Rotate injection sites. Common side effects include: nausea, diarrhea/constipation, and heartburn, and are more likely to occur if you overeat. Stop your injection for 7 days prior to surgical procedures requiring anesthesia.  Dosing schedule:  We will touch base with you monthly over the phone. The medication can be increased in monthly intervals depending on tolerability and efficacy.  Tips for success: Write down the reasons why you want to lose weight and post it in a place where you'll see it often.  Start small and work your way up. Keep in mind that it takes time to achieve goals, and small steps add up.  Any additional movements help to burn calories. Taking the stairs rather than the elevator and parking at the far end of your parking lot are easy ways to start. Brisk walking for at least 30 minutes 4 or more days of the week is an excellent goal to work toward  Understanding what it means to feel full: Did you know that it can take 15 minutes or more for your brain to receive the message that you've eaten? That means that, if you eat less food, but consume it slower, you may still feel satisfied.  Eating a lot of fruits and vegetables can  also help you feel fuller.  Eat off of smaller plates so that moderate portions don't seem too small  Tips for living a healthier life     Building a Healthy and Balanced Diet Make most of your meal vegetables and fruits -  of your plate. Aim for color and variety, and remember that potatoes don't count as vegetables on the Healthy Eating Plate because of their negative impact on blood sugar.  Go for whole grains -  of your plate. Whole and intact grains--whole wheat, barley, wheat berries, quinoa, oats, brown rice, and foods made with them, such as whole wheat pasta--have a milder effect on blood sugar and insulin  than white bread, white rice, and other refined grains.  Protein power -  of your plate. Fish, poultry, beans, and nuts are all healthy, versatile protein sources--they can be mixed into salads, and pair well with vegetables on a plate. Limit red meat, and avoid processed meats such as bacon and sausage.  Healthy plant oils - in moderation. Choose healthy vegetable oils like olive, canola, soy, corn, sunflower, peanut, and others, and avoid partially hydrogenated oils, which contain unhealthy trans fats. Remember that low-fat does not mean "healthy."  Drink water, coffee, or tea. Skip sugary drinks, limit milk and dairy products to one to two servings per day, and limit juice to a small glass per day.  Stay active. The red figure running across the Healthy Eating Plate's placemat is a reminder that staying active is also  important in weight control.  The main message of the Healthy Eating Plate is to focus on diet quality:  The type of carbohydrate in the diet is more important than the amount of carbohydrate in the diet, because some sources of carbohydrate--like vegetables (other than potatoes), fruits, whole grains, and beans--are healthier than others. The Healthy Eating Plate also advises consumers to avoid sugary beverages, a major source of calories--usually with  little nutritional value--in the American diet. The Healthy Eating Plate encourages consumers to use healthy oils, and it does not set a maximum on the percentage of calories people should get each day from healthy sources of fat. In this way, the Healthy Eating Plate recommends the opposite of the low-fat message promoted for decades by the USDA.  cuetune.com.ee  SUGAR  Sugar is a huge problem in the modern day diet. Sugar is a big contributor to heart disease, diabetes, high triglyceride levels, fatty liver disease and obesity. Sugar is hidden in almost all packaged foods/beverages. Added sugar is extra sugar that is added beyond what is naturally found and has no nutritional benefit for your body. The American Heart Association recommends limiting added sugars to no more than 25g for women and 36 grams for men per day. There are many names for sugar including maltose, sucrose (names ending in ose), high fructose corn syrup, molasses, cane sugar, corn sweetener, raw sugar, syrup, honey or fruit juice concentrate.   One of the best ways to limit your added sugars is to stop drinking sweetened beverages such as soda, sweet tea, and fruit juice.  There is 65g of added sugars in one 20oz bottle of Coke! That is equal to 7.5 donuts.   Pay attention and read all nutrition facts labels. Below is an examples of a nutrition facts label. The #1 is showing you the total sugars where the # 2 is showing you the added sugars. This one serving has almost the max amount of added sugars per day!   EXERCISE  Exercise is good. We've all heard that. In an ideal world, we would all have time and resources to get plenty of it. When you are active, your heart pumps more efficiently and you will feel better.  Multiple studies show that even walking regularly has benefits that include living a longer life. The American Heart Association recommends 150 minutes per week  of exercise (30 minutes per day most days of the week). You can do this in any increment you wish. Nine or more 10-minute walks count. So does an hour-long exercise class. Break the time apart into what will work in your life. Some of the best things you can do include walking briskly, jogging, cycling or swimming laps. Not everyone is ready to "exercise." Sometimes we need to start with just getting active. Here are some easy ways to be more active throughout the day:  Take the stairs instead of the elevator  Go for a 10-15 minute walk during your lunch break (find a friend to make it more enjoyable)  When shopping, park at the back of the parking lot  If you take public transportation, get off one stop early and walk the extra distance  Pace around while making phone calls  Check with your doctor if you aren't sure what your limitations may be. Always remember to drink plenty of water when doing any type of exercise. Don't feel like a failure if you're not getting the 90-150 minutes per week. If you started by being a  couch potato, then just a 10-minute walk each day is a huge improvement. Start with little victories and work your way up.   HEALTHY EATING TIPS              Plan ahead: make a menu of the meals for a week then create a grocery list to go with that menu. Consider meals that easily stretch into a night of leftovers, such as stews or casseroles. Or consider making two of your favorite meal and put one in the freezer for another night. Try a night or two each week that is "meatless" or "no cook" such as salads. When you get home from the grocery store wash and prepare your vegetables and fruits. Then when you need them they are ready to go.   Tips for going to the grocery store:  Buy store or generic brands  Check the weekly ad from your store on-line or in their in-store flyer  Look at the unit price on the shelf tag to compare/contrast the costs of different items  Buy  fruits/vegetables in season  Carrots, bananas and apples are low-cost, naturally healthy items  If meats or frozen vegetables are on sale, buy some extras and put in your freezer  Limit buying prepared or "ready to eat" items, even if they are pre-made salads or fruit snacks  Do not shop when you're hungry  Foods at eye level tend to be more expensive. Look on the high and low shelves for deals.  Consider shopping at the farmer's market for fresh foods in season.  Avoid the cookie and chip aisles (these are expensive, high in calories and low in nutritional value). Shop on the outside of the grocery store.  Healthy food preparations:  If you can't get lean hamburger, be sure to drain the fat when cooking  Steam, saut (in olive oil), grill or bake foods  Experiment with different seasonings to avoid adding salt to your foods. Kosher salt, sea salt and Himalayan salt are all still salt and should be avoided. Try seasoning food with onion, garlic, thyme, rosemary, basil ect. Onion powder or garlic powder is ok. Avoid if it says salt (ie garlic salt).

## 2023-09-07 ENCOUNTER — Other Ambulatory Visit: Payer: Self-pay | Admitting: Cardiovascular Disease

## 2023-09-07 ENCOUNTER — Other Ambulatory Visit (HOSPITAL_COMMUNITY): Payer: Self-pay

## 2023-09-07 ENCOUNTER — Telehealth: Payer: Self-pay | Admitting: Pharmacy Technician

## 2023-09-07 MED ORDER — MOUNJARO 2.5 MG/0.5ML ~~LOC~~ SOAJ: 2.5000 mg | SUBCUTANEOUS | 0 refills | Status: DC

## 2023-09-07 NOTE — Addendum Note (Signed)
 Addended by: Malena Peer D on: 09/07/2023 12:38 PM   Modules accepted: Orders

## 2023-09-07 NOTE — Telephone Encounter (Signed)
-----   Message from Olene Floss sent at 09/05/2023  4:03 PM EST ----- Please do PA for [facility] 2.5mg

## 2023-09-07 NOTE — Telephone Encounter (Signed)
 Called pt. She states $300 is not do able for her but I can send in Rx just incase. Advised she call her insurance and see if she has a deductible as cost may go down significantly after. Also advised there is a $2000 cap on medicare this year.  Will follow up in 3 weeks to see if she started.

## 2023-09-07 NOTE — Telephone Encounter (Signed)
 Pharmacy Patient Advocate Encounter   Received notification from Patient Advice Request messages that prior authorization for mounjaro  is required/requested.   Insurance verification completed.   The patient is insured through  [employer]  .   Per test claim: The current 09/07/23 day co-pay is, $314.02 one month  .  No PA needed at this time. This test claim was processed through [facility]- copay amounts may vary at other pharmacies due to pharmacy/plan contracts, or as the patient moves through the different stages of their insurance plan.

## 2023-09-08 ENCOUNTER — Ambulatory Visit: Payer: Self-pay | Admitting: Family Medicine

## 2023-09-08 ENCOUNTER — Encounter: Payer: Self-pay | Admitting: Family Medicine

## 2023-09-08 ENCOUNTER — Ambulatory Visit: Payer: Medicare Other | Admitting: Family Medicine

## 2023-09-08 VITALS — BP 122/82 | HR 72 | Temp 97.8°F | Ht 68.0 in | Wt 227.0 lb

## 2023-09-08 DIAGNOSIS — E1159 Type 2 diabetes mellitus with other circulatory complications: Secondary | ICD-10-CM

## 2023-09-08 DIAGNOSIS — T148XXA Other injury of unspecified body region, initial encounter: Secondary | ICD-10-CM | POA: Diagnosis not present

## 2023-09-08 MED ORDER — TRAMADOL HCL 50 MG PO TABS: 50.0000 mg | ORAL_TABLET | Freq: Three times a day (TID) | ORAL | 0 refills | Status: AC | PRN

## 2023-09-08 MED ORDER — TIRZEPATIDE 2.5 MG/0.5ML ~~LOC~~ SOAJ: 2.5000 mg | SUBCUTANEOUS | 1 refills | Status: DC

## 2023-09-08 NOTE — Telephone Encounter (Signed)
  Chief Complaint: High blood sugar Symptoms: Dizziness/Numbness and tingling to bilateral hands and feet Frequency: Ongoing for approx 1 month, dizziness/numbness tingling developed in the last 3 days Pertinent Negatives: Patient denies increased urination, abd pain, N/V, fatigue Disposition: [] ED /[] Urgent Care (no appt availability in office) / [] Appointment(In office/virtual)/ []  [facility]/ [x] Home Care/ [] Refused Recommended Disposition /[] [facility]/ [x]  Follow-up with PCP Additional Notes: Pt called to report she has been experiencing high blood glucose readings for approx 1 month and has been working with a specialist to get them more controlled. Pt reports she is waiting on a Mounjaro  PA so she is able to fill and begin that medication. She is currently taking Glipizide  20 mg BID. Pt denies abd pain, N/V, confusion, fatigue, but does report dizziness and numbness and tingling in her hands and feet that have developed in the last 3 days. Pt reports the reading is from her CGM and she does not have access to a meter to verify the reading. Advised pt ED/UC for eval of new symptoms, pt declines. This RN educated pt on home care to help bring her BG readings down. Advised this RN would forward message to provider for review.   Copied from CRM [id number]. Topic: Clinical - Pink Word Triage >> Sep 08, 2023  5:46 PM Delon T wrote: Reason for Triage: Blood sugar is 336, having an issue getting the tirzepatide  (MOUNJARO ) 2.5 MG/0.5ML Pen filled, pharmacy says they need prior authorization, please call [phone] Reason for Disposition  [1] Caller has NON-URGENT medication or insulin  pump question AND [2] triager unable to answer question  Answer Assessment - Initial Assessment Questions 1. BLOOD GLUCOSE: What is your blood glucose level?      345 2. ONSET: When did you check the blood glucose?     Now 3. USUAL RANGE: What is your glucose level usually? (e.g., usual  fasting morning value, usual evening value)     Usually runs high  5. TYPE 1 or 2:  Do you know what type of diabetes you have?  (e.g., Type 1, Type 2, Gestational; doesn't know)      Type 2 6. INSULIN : Do you take insulin ? What type of insulin (s) do you use? What is the mode of delivery? (syringe, pen; injection or pump)?      No 7. DIABETES PILLS: Do you take any pills for your diabetes? If Yes, ask: Have you missed taking any pills recently?     Glipizide  20 mg 8. OTHER SYMPTOMS: Do you have any symptoms? (e.g., fever, frequent urination, difficulty breathing, dizziness, weakness, vomiting)     Dizziness, tingling in hands and feet  Protocols used: Diabetes - High Blood Sugar-A-AH

## 2023-09-08 NOTE — Progress Notes (Signed)
 Subjective:    Patient ID: Sheri Hernandez, female    DOB: [birth year], 68 y.o.   MRN: [id number]  Patient is a 68 year old Caucasian female who fell through the floor of her home on December 31.  She suffered a hematoma to her lateral left thigh.  This is roughly 5 inches in diameter.  She also suffered an abrasion to her anterior medial right shin.  Patient is extremely tender to palpation in both of these areas.  There is no erythema.  There is no warmth.  There is no evidence of any secondary cellulitis.  The hematoma on the anterior right shin is roughly 2 inches in diameter.  However the patient is walking and able to bear weight on both legs without any pain at all.  Therefore I do not have any concerns about a fracture.  Patient appears to have suffered 2 contusions due to the fall and the impact.  She denies any her knees or her hips.  While the patient was here today also discussed her hemoglobin A1c.  Was recently found to be 9.5.  She is unable to afford the Mounjaro .  She is currently on glipizide .  She cannot tolerate Jardiance or Farxiga due to yeast infections and UTIs.  She refuses to take metformin.  Therefore her only other option would be insulin  or Actos.  I hesitate to give Actos to her cardiac patient however I am also concerned if her blood sugars are uncontrolled.  Therefore we discussed getting Mounjaro  versus taking Actos. Past Medical History:  Diagnosis Date   Acute blood loss anemia 01/12/2015   Adenocarcinoma, renal cell (HCC) 11/12/2012   Overview:   09/25/12 - CXR - lung masses  CT CAP - rt hilar mass, mult pulm nodules, LLL mass 3 x 3.1 cm, RLL mass 2.2 x 2.0 cm, left kidney mass 12.4 x 11.1 x 10.3 cm  10/05/12 PET - left lung mass 30 mm SUV 16.7, rt lung nodule 21 mm, SUV 10.4, left kidney mass 7 cm SUV 18, mass compressing RLL bronchus  10/11/12 lung biopsy - atypical cells  10/23/12 left kidney biopsy - high gr clear cell RCC    Anxiety    Arthritis    maybe in my  right hand (05/26/2017)   CAD (coronary artery disease)    a. 12/2014 Cath: LM nl, LAD 20p, 51m, D2 20ost, LCX 95p, 38m, RCA dominant, 95p, 79m/d, RPDA 95, RPL2 50, attempted RCA PCI w/ acute RCA occlusion-->CABG x 2 VG->RPDA->PLVB.   Chronic diastolic CHF (congestive heart failure) (HCC)    Drug-induced hypothyroidism 03/13/2014   GERD (gastroesophageal reflux disease) 03/18/2015   Hyperglycemia 01/12/2015   Hypertension    Hypoalbuminemia    Hypothyroidism    Lower extremity edema    Lung mass    LUNG MASSES--COUGH-BIOPSY NON-DIAGNOSTIC.  HX OF MEDIASTINAL MASS AND LUNG MASSES AGE 78 -NEGATIVE BIOSPY-BUT GIVEN DIAGNOSIS OF SARCOIDOSIS.  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   Papilloma of breast 2012   left. lumpectomy   Pulmonary nodules    Renal cell cancer (HCC)  metastatic renal cell carcinoma (biopsy of left renal mass 10/2012) On nivolumab  [facility]   Renal mass 10/01/2012   10/23/12 Bx Pos high grad clear cell renal cell ca> referred to oncology 10/26/2012     S/p nephrectomy    left, due to metastatic RCC, 3/14   Past Surgical History:  Procedure Laterality Date   BREAST BIOPSY  08/18/2011   Procedure: BREAST BIOPSY WITH NEEDLE LOCALIZATION;  Surgeon: Deward GORMAN Curvin DOUGLAS, MD;  Location: [facility] OR;  Service: General;  Laterality: Left;  left breast biopsy with needle localization   BRONCHIAL BRUSH BIOPSY  2014   CARDIAC CATHETERIZATION     CORONARY ARTERY BYPASS GRAFT N/A 01/02/2015   Procedure: CORONARY ARTERY BYPASS GRAFTING (CABG)TIMES TWO USING RIGHT GREATER SAPHENOUS LEG VEIN HARVESTED ENDOSCOPICALLY;  Surgeon: Dallas KATHEE Jude, MD;  Location: [facility];  Service: Open Heart Surgery;  Laterality: N/A;   LAPAROSCOPIC NEPHRECTOMY Left 11/22/2012   Procedure: LAPAROSCOPIC NEPHRECTOMY;  Surgeon: Noretta Ferrara, MD;  Location: [facility] ORS;  Service: Urology;  Laterality: Left;   LEFT HEART CATH AND CORS/GRAFTS ANGIOGRAPHY N/A 10/11/2016   Procedure: Left Heart Cath and Cors/Grafts Angiography;  Surgeon: Ozell Fell, MD;  Location: [facility] INVASIVE CV LAB;  Service: Cardiovascular;  Laterality: N/A;   LEFT HEART CATHETERIZATION WITH CORONARY ANGIOGRAM N/A 01/02/2015   Procedure: LEFT HEART CATHETERIZATION WITH CORONARY ANGIOGRAM;  Surgeon: Alm LELON Clay, MD;  Location: [facility] CATH LAB;  Service: Cardiovascular;  Laterality: N/A;   NECK LESION BIOPSY Left 08/2016   PORTA CATH INSERTION Right ~ 2017   TONSILLECTOMY  1967   VIDEO BRONCHOSCOPY  10/11/2012   Procedure: VIDEO BRONCHOSCOPY WITH FLUORO;  Surgeon: Francis CHRISTELLA Dresser, MD;  Location: [facility] ENDOSCOPY;  Service: Cardiopulmonary;  Laterality: Bilateral;   WIDS     WISDOM TOOTH EXTRACTION     all 4   Current Outpatient Medications on File Prior to Visit  Medication Sig Dispense Refill   acetaminophen  (TYLENOL ) 500 MG tablet Take 500-1,000 mg by mouth every 6 (six) hours as needed for mild pain or headache.     aspirin  81 MG EC tablet Take 1 tablet (81 mg total) by mouth daily.     atorvastatin  (LIPITOR ) 40 MG tablet TAKE 1 TABLET BY MOUTH EVERY DAY 90 tablet 3   Blood Glucose Monitoring Suppl (BLOOD GLUCOSE SYSTEM PAK) KIT Please dispense based on patient and insurance preference. Use as directed to monitor FSBS 4x daily due to uncontrolled diabetes. Dx: E11.65 1 kit 1   Cholecalciferol (VITAMIN D-3) 25 MCG (1000 UT) CAPS Take 1,000 Units by mouth daily with breakfast.     clopidogrel  (PLAVIX ) 75 MG tablet TAKE 1 TABLET BY MOUTH EVERY DAY 90 tablet 3   Continuous Blood Gluc Sensor (FREESTYLE LIBRE 3 SENSOR) MISC SMARTSIG:1 Topical Every 2 Weeks 1 each 5   glipiZIDE  (GLUCOTROL  XL) 10 MG  24 hr tablet TAKE 1 TABLET (10 MG TOTAL) BY MOUTH DAILY WITH BREAKFAST. (Patient taking differently: Take 10 mg by mouth 2 (two) times daily with a meal.) 90 tablet 1   Glucose Blood (BLOOD GLUCOSE TEST STRIPS) STRP Please dispense based on patient and insurance preference. Use as directed to monitor FSBS 4x daily due to uncontrolled diabetes. Dx: E11.65 150 strip 11   isosorbide  mononitrate (IMDUR ) 60 MG 24 hr tablet TAKE 2 TABLETS BY MOUTH EVERY DAY 180 tablet 3   Lancets MISC Please dispense based on patient and insurance preference. Use as directed to monitor FSBS 4x daily due to  uncontrolled diabetes. Dx: E11.65 150 each 11   levothyroxine  (SYNTHROID ) 125 MCG tablet Take 1 tablet by mouth daily.     lidocaine -prilocaine  (EMLA ) cream Apply 1 application topically See admin instructions. APPLY CREAM TO PORTACATH SITE APPROXIMATELY 1 HOUR BEFORE ACCESS AND COVER     losartan  (COZAAR ) 100 MG tablet TAKE 1 TABLET BY MOUTH EVERY DAY 90 tablet 1   metoprolol  tartrate (LOPRESSOR ) 100 MG tablet TAKE 1 TABLET BY MOUTH TWICE A DAY 180 tablet 1   nitroGLYCERIN  (NITROSTAT ) 0.4 MG SL tablet PLACE 1 TABLET UNDER THE TONGUE EVERY 5 MINUTES AS NEEDED FOR CHEST PAIN. 25 tablet 3   ondansetron  (ZOFRAN  ODT) 4 MG disintegrating tablet Take 1 tablet (4 mg total) by mouth every 8 (eight) hours as needed for nausea or vomiting. 20 tablet 0   spironolactone  (ALDACTONE ) 25 MG tablet Take 1 tablet (25 mg total) by mouth daily. 90 tablet 3   Continuous Blood Gluc Receiver (FREESTYLE LIBRE 2 READER) DEVI  (Patient not taking: Reported on 09/08/2023)     No current facility-administered medications on file prior to visit.   Allergies  Allergen Reactions   Silver Dermatitis and Other (See Comments)    Please use Mepilex bandaging (brown/tan, not silver)   Tape Other (See Comments)    PLEASE DO NOT USE PLASTIC TAPE!! 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   Tobacco Use   Smoking status: Former    Current packs/day: 0.00    Average packs/day: 1.5 packs/day for 40.0 years (60.0 ttl pk-yrs)    Types: Cigarettes    Start date: 09/24/1972    Quit date: 09/24/2012    Years since quitting: 10.9   Smokeless tobacco: Never  Vaping Use   Vaping status: Never Used  Substance and Sexual Activity   Alcohol use: Yes    Comment: 05/26/2017 couple drinks/year   Drug use: No   Sexual activity: Yes  Other Topics Concern   Not on file  Social History Narrative   Not on file   Social Drivers of Health   Financial Resource Strain: Low Risk  (03/23/2023)   Overall Financial Resource Strain (CARDIA)    Difficulty of Paying Living Expenses: Not hard at all  Food Insecurity: No Food Insecurity (03/23/2023)   Hunger Vital Sign    Worried About Running Out of Food in the Last Year: Never true    Ran Out of Food in the Last Year: Never true  Transportation Needs: No Transportation Needs (03/23/2023)   PRAPARE - Administrator, Civil Service (Medical): No    Lack of Transportation (Non-Medical): No  Physical Activity: Insufficiently Active (03/23/2023)   Exercise Vital Sign    Days of Exercise per Week: 1 day    Minutes of Exercise per Session: 10 min  Stress: No Stress Concern Present (03/23/2023)   [employer] of Occupational Health - Occupational Stress Questionnaire    Feeling of Stress : Only a little  Social Connections: Socially Integrated (03/23/2023)   Social Connection and Isolation Panel [NHANES]    Frequency of Communication with Friends and Family: Three times a week    Frequency of Social Gatherings with  Friends and Family: Once a week    Attends Religious Services: More than 4 times per year    Active Member of [employer] or  Organizations: Yes    Attends Engineer, Structural: More than 4 times per year    Marital Status: Married  Catering Manager Violence: Not At Risk (03/23/2023)   Humiliation, Afraid, Rape, and Kick questionnaire    Fear of Current or Ex-Partner: No    Emotionally Abused: No    Physically Abused: No    Sexually Abused: No      Review of Systems  All other systems reviewed and are negative.      Objective:   Physical Exam Vitals reviewed.  Constitutional:      Appearance: Normal appearance. She is normal weight.  Musculoskeletal:       Legs:  Skin:    Findings: Bruising present. No erythema or rash.  Neurological:     General: No focal deficit present.     Mental Status: She is alert and oriented to person, place, and time.  Psychiatric:        Attention and Perception: Attention normal.        Behavior: Behavior normal.        Thought Content: Thought content normal.        Cognition and Memory: Cognition and memory normal.        Judgment: Judgment normal.     5 inch diameter hematoma on the lateral left thigh.  2 inch hematoma on anterior medial right shin.  No evidence of secondary cellulitis.      Assessment & Plan:  Type 2 diabetes mellitus with other circulatory complication, without long-term current use of insulin  (HCC)  Hematoma Patient complains of significant pain around the hematoma.  Pain is out of proportion to exam.  Patient shows no evidence of any pain with standing or walking.  There is no issues with pain with weightbearing.  Therefore I do not feel that there is a fracture.  I did give her tramadol  50 mg every 8 hours as needed for pain, 15 tablets.  My bigger concern is her uncontrolled diabetes.  I sent a prescription for Mounjaro  to her pharmacy.  I want her to check on the price now that the calendar has changed to 2025 in the donut hole has expired.  If this is still expensive, I would recommend trying Actos.  I usually avoid Actos and  patients with coronary artery disease however her uncontrolled diabetes is a risk factor and the patient's options are limited as she refuses metformin, Jardiance, Farxiga.

## 2023-09-11 ENCOUNTER — Telehealth: Payer: Self-pay

## 2023-09-11 ENCOUNTER — Other Ambulatory Visit (HOSPITAL_COMMUNITY): Payer: Self-pay

## 2023-09-11 ENCOUNTER — Telehealth: Payer: Self-pay | Admitting: Pharmacy Technician

## 2023-09-11 NOTE — Telephone Encounter (Signed)
 It looks like pt has commercial retirement plan and could use copay card. I gave her website to get card. Would take $150/off per month. She also said she got a message today that the PA for Greggory Keen was denied. Will see if Ozempic is less expensive.

## 2023-09-11 NOTE — Telephone Encounter (Signed)
 Pharmacy Patient Advocate Encounter   Received notification from Pt Calls Messages that prior authorization for ozempic is required/requested.   Insurance verification completed.   The patient is insured through  [employer]  .   Per test claim: The current 09/11/23 day co-pay is, $290.13 one month.  No PA needed at this time. This test claim was processed through [facility]- copay amounts may vary at other pharmacies due to pharmacy/plan contracts, or as the patient moves through the different stages of their insurance plan.

## 2023-09-11 NOTE — Telephone Encounter (Signed)
 Copied from CRM [id number]. Topic: Clinical - Prescription Issue >> Sep 11, 2023 10:22 AM Powell HERO wrote: Reason for CRM: Pharmacy states that the patient needs to have a prior authorization to pick up her tirzepatide  (MOUNJARO ) 2.5 MG/0.5ML Pen. She is very frustrated with this issue.

## 2023-09-12 ENCOUNTER — Encounter: Payer: Self-pay | Admitting: Pharmacist

## 2023-09-14 ENCOUNTER — Ambulatory Visit: Payer: Medicare Other | Admitting: Family Medicine

## 2023-09-28 ENCOUNTER — Encounter: Payer: Self-pay | Admitting: Cardiovascular Disease

## 2023-09-28 ENCOUNTER — Ambulatory Visit: Payer: Medicare Other | Attending: Cardiovascular Disease | Admitting: Cardiovascular Disease

## 2023-09-28 VITALS — BP 134/88 | HR 60 | Ht 68.0 in | Wt 220.0 lb

## 2023-09-28 DIAGNOSIS — I25708 Atherosclerosis of coronary artery bypass graft(s), unspecified, with other forms of angina pectoris: Secondary | ICD-10-CM

## 2023-09-28 DIAGNOSIS — E782 Mixed hyperlipidemia: Secondary | ICD-10-CM | POA: Diagnosis not present

## 2023-09-28 DIAGNOSIS — I10 Essential (primary) hypertension: Secondary | ICD-10-CM | POA: Diagnosis not present

## 2023-09-28 NOTE — Patient Instructions (Signed)
Follow-Up: At [facility], you and your health needs are our priority.  As part of our continuing mission to provide you with exceptional heart care, we have created designated Provider Care Teams.  These Care Teams include your primary Cardiologist (physician) and Advanced Practice Providers (APPs -  Physician Assistants and Nurse Practitioners) who all work together to provide you with the care you need, when you need it.  We recommend signing up for the patient portal called "MyChart".  Sign up information is provided on this After Visit Summary.  MyChart is used to connect with patients for Virtual Visits (Telemedicine).  Patients are able to view lab/test results, encounter notes, upcoming appointments, etc.  Non-urgent messages can be sent to your provider as well.   To learn more about what you can do with MyChart, go to [email or web address].    Your next appointment:   6 month(s)  Provider:   Tonny Bollman, MD     Other Instructions   1st Floor: - Lobby - Registration  - Pharmacy  - Lab - Cafe  2nd Floor: - PV Lab - Diagnostic Testing (echo, CT, nuclear med)  3rd Floor: - Vacant  4th Floor: - TCTS (cardiothoracic surgery) - AFib Clinic - Structural Heart Clinic - Vascular Surgery  - Vascular Ultrasound  5th Floor: - [facility] (general and EP) - Clinical Pharmacy for coumadin, hypertension, lipid, weight-loss medications, and med management appointments    Valet parking services will be available as well.

## 2023-09-28 NOTE — Progress Notes (Signed)
Cardiology Office Note:    Date:  09/29/2023   ID:  Sheri Hernandez, DOB 06-08-1956, MRN [id number]  PCP:  Donita Brooks, MD   [facility] Providers Cardiologist:  Tonny Bollman, MD     Referring MD: Donita Brooks, MD   Chief Complaint  Patient presents with   Coronary Artery Disease    History of Present Illness:    Sheri Hernandez is a 68 y.o. female with a hx of CAD, presenting for follow-up evaluation. The patient has a complicated history, initially presenting with non-STEMI in 2016 and undergoing complex PCI of severe stenosis in the RCA complicated by extensive dissection requiring emergency CABG with the saphenous vein graft to PDA and PLA.  The patient has had recurrent angina and follow-up catheterization demonstrated occlusion of her bypass graft.  The RCA remained patent with tight residual stenosis and medical therapy was recommended in the setting of controlled symptoms.  Comorbid conditions include history of metastatic renal cell carcinoma status post left nephrectomy, breast cancer status post left lumpectomy, hypertension, and mixed hyperlipidemia.   She is here alone today.  It is pretty remarkable but we talked about her recent oncology visit and she has been in complete remission from metastatic renal cell carcinoma now for over 5 years.  Her diabetes is not well-controlled.  She reports no recent chest pain or pressure.  No shortness of breath, orthopnea, PND, or heart palpitations.  She continues to have some problems with leg swelling.  She had an injury where she fell through the floor around New Year's Eve and is still healing up from that.   Current Medications: Current Meds  Medication Sig   acetaminophen (TYLENOL) 500 MG tablet Take 500-1,000 mg by mouth every 6 (six) hours as needed for mild pain or headache.   aspirin 81 MG EC tablet Take 1 tablet (81 mg total) by mouth daily.   atorvastatin (LIPITOR) 40 MG tablet TAKE 1 TABLET BY MOUTH  EVERY DAY   Blood Glucose Monitoring Suppl (BLOOD GLUCOSE SYSTEM PAK) KIT Please dispense based on patient and insurance preference. Use as directed to monitor FSBS 4x daily due to uncontrolled diabetes. Dx: E11.65   Cholecalciferol (VITAMIN D-3) 25 MCG (1000 UT) CAPS Take 1,000 Units by mouth daily with breakfast.   clopidogrel (PLAVIX) 75 MG tablet TAKE 1 TABLET BY MOUTH EVERY DAY   Continuous Blood Gluc Sensor (FREESTYLE LIBRE 3 SENSOR) MISC SMARTSIG:1 Topical Every 2 Weeks   glipiZIDE (GLUCOTROL XL) 10 MG 24 hr tablet TAKE 1 TABLET (10 MG TOTAL) BY MOUTH DAILY WITH BREAKFAST. (Patient taking differently: Take 10 mg by mouth 2 (two) times daily with a meal.)   Glucose Blood (BLOOD GLUCOSE TEST STRIPS) STRP Please dispense based on patient and insurance preference. Use as directed to monitor FSBS 4x daily due to uncontrolled diabetes. Dx: E11.65   insulin glargine (LANTUS) 100 UNIT/ML injection Inject 10 Units into the skin daily.   isosorbide mononitrate (IMDUR) 60 MG 24 hr tablet TAKE 2 TABLETS BY MOUTH EVERY DAY   Lancets MISC Please dispense based on patient and insurance preference. Use as directed to monitor FSBS 4x daily due to uncontrolled diabetes. Dx: E11.65   levothyroxine (SYNTHROID) 125 MCG tablet Take 1 tablet by mouth daily.   lidocaine-prilocaine (EMLA) cream Apply 1 application topically See admin instructions. APPLY CREAM TO PORTACATH SITE APPROXIMATELY 1 HOUR BEFORE ACCESS AND COVER   losartan (COZAAR) 100 MG tablet TAKE 1 TABLET BY MOUTH EVERY DAY  metoprolol tartrate (LOPRESSOR) 100 MG tablet TAKE 1 TABLET BY MOUTH TWICE A DAY   nitroGLYCERIN (NITROSTAT) 0.4 MG SL tablet PLACE 1 TABLET UNDER THE TONGUE EVERY 5 MINUTES AS NEEDED FOR CHEST PAIN.   ondansetron (ZOFRAN ODT) 4 MG disintegrating tablet Take 1 tablet (4 mg total) by mouth every 8 (eight) hours as needed for nausea or vomiting.   spironolactone (ALDACTONE) 25 MG tablet Take 1 tablet (25 mg total) by mouth daily.      Allergies:   Silver and Tape   ROS:   Please see the history of present illness.    All other systems reviewed and are negative.  EKGs/Labs/Other Studies Reviewed:    The following studies were reviewed today: Cardiac Studies & Procedures   CARDIAC CATHETERIZATION  CARDIAC CATHETERIZATION 10/11/2016  Narrative 1. Severe 2 vessel CAD with tight stenosis of a small left circumflex and total occlusion of the proximal RCA (large dominant vessel) 2. Nonobstructive left main and LAD stenosis 3. Normal LV function by noninvasive assessment 4. Heavily calcified coronary arteries  Plan:  Initial medical therapy (negative enzymes this admission)  If fails medical therapy, consider PCI of the proximal LCx and CTO-PCI with atherectomy of the RCA  OK to discharge home tomorrow if no complications arise  Findings Coronary Findings Diagnostic  Dominance: Right  Left Anterior Descending The lesion is calcified.  Left Circumflex The lesion is concentric. unchanged from previous cath study in 2016  Right Coronary Artery with left-to-right collateral flow. The lesion is severely calcified. There is heavy calcification and short total occlusion of the RCA in the proximal vessel. The LAD collateralizes the RCA branches via septal collaterals.  Saphenous Graft To RPDA SVG graft was visualized by angiography. Total occlusion just beyond the aortic anastomosis.  Intervention  No interventions have been documented.   STRESS TESTS  NM MYOCAR MULTI W/SPECT W 10/10/2016  Narrative CLINICAL DATA:  Chest pain  EXAM: MYOCARDIAL IMAGING WITH SPECT (REST AND EXERCISE)  GATED LEFT VENTRICULAR WALL MOTION STUDY  LEFT VENTRICULAR EJECTION FRACTION  TECHNIQUE: Standard myocardial SPECT imaging was performed after resting intravenous injection of 10 mCi Tc-47m tetrofosmin. Subsequently, exercise tolerance test was performed by the patient under the supervision of the Cardiology staff. At  peak-stress, 30 mCi Tc-64m tetrofosmin was injected intravenously and standard myocardial SPECT imaging was performed. Quantitative gated imaging was also performed to evaluate left ventricular wall motion, and estimate left ventricular ejection fraction.  COMPARISON:  None.  FINDINGS: Perfusion: There is decreased activity noted on rest and stress images in the inferolateral wall, worsening somewhat on stress images. This is concerning for inferolateral infarct with mild peri-infarct ischemia.  Wall Motion: Decreased motion inferiorly.  Left Ventricular Ejection Fraction: 53 %  End diastolic volume 66 ml  End systolic volume 31 ml  IMPRESSION: 1. Inferolateral defect on rest and stress images, worsening on stress images concerning for area of infarct with peri-infarct ischemia.  2. Decreased inferior wall motion.  3. Left ventricular ejection fraction 53%  4. Non invasive risk stratification*: Intermediate  *2012 Appropriate Use Criteria for Coronary Revascularization Focused Update: J Am Coll Cardiol. 2012;59(9):857-881. http://content.dementiazones.com.aspx?articleid=1201161   Electronically Signed By: Charlett Nose M.D. On: 10/10/2016 16:19  ECHOCARDIOGRAM  ECHOCARDIOGRAM COMPLETE 05/04/2022  Narrative ECHOCARDIOGRAM REPORT    Patient Name:   JASMEEN FRITSCH Date of Exam: 05/04/2022 Medical Rec #:  [id number]        Height:       68.0 in Accession #:  [id number]       Weight:       219.0 lb Date of Birth:  [birth year]        BSA:          2.124 m Patient Age:    66 years         BP:           134/88 mmHg Patient Gender: F                HR:           64 bpm. Exam Location:  [address]  Procedure: 2D Echo, Cardiac Doppler and Color Doppler  Indications:    I25.119 Coronary artery disease;  History:        Patient has prior history of Echocardiogram examinations, most recent 06/03/2020. CHF, Previous Myocardial Infarction, Prior CABG,  Signs/Symptoms:Murmur and Chest Pain; Risk Factors:Hypertension, Diabetes and Dyslipidemia. Near syncope. Lower extremity edema. S/P nephrectomy.  Sonographer:    Cathie Beams RCS Referring Phys: Tonny Bollman  IMPRESSIONS   1. Left ventricular ejection fraction, by estimation, is 55 to 60%. The left ventricle has normal function. The left ventricle demonstrates regional wall motion abnormalities with basal inferior hypokinesis. There is mild left ventricular hypertrophy. Left ventricular diastolic parameters are consistent with Grade I diastolic dysfunction (impaired relaxation). 2. Right ventricular systolic function is normal. The right ventricular size is normal. There is normal pulmonary artery systolic pressure. The estimated right ventricular systolic pressure is 23.6 mmHg. 3. Left atrial size was mildly dilated. 4. The mitral valve is normal in structure. Mild mitral valve regurgitation. No evidence of mitral stenosis. 5. The aortic valve is tricuspid. There is moderate calcification of the aortic valve. Aortic valve regurgitation is trivial. No aortic stenosis is present. 6. Aortic dilatation noted. There is mild dilatation of the ascending aorta, measuring 38 mm. 7. The inferior vena cava is normal in size with greater than 50% respiratory variability, suggesting right atrial pressure of 3 mmHg.  FINDINGS Left Ventricle: Left ventricular ejection fraction, by estimation, is 55 to 60%. The left ventricle has normal function. The left ventricle demonstrates regional wall motion abnormalities. The left ventricular internal cavity size was normal in size. There is mild left ventricular hypertrophy. Left ventricular diastolic parameters are consistent with Grade I diastolic dysfunction (impaired relaxation).  Right Ventricle: The right ventricular size is normal. No increase in right ventricular wall thickness. Right ventricular systolic function is normal. There is normal pulmonary  artery systolic pressure. The tricuspid regurgitant velocity is 2.27 m/s, and with an assumed right atrial pressure of 3 mmHg, the estimated right ventricular systolic pressure is 23.6 mmHg.  Left Atrium: Left atrial size was mildly dilated.  Right Atrium: Right atrial size was normal in size.  Pericardium: There is no evidence of pericardial effusion.  Mitral Valve: The mitral valve is normal in structure. There is mild calcification of the mitral valve leaflet(s). Mild mitral annular calcification. Mild mitral valve regurgitation. No evidence of mitral valve stenosis.  Tricuspid Valve: The tricuspid valve is normal in structure. Tricuspid valve regurgitation is trivial.  Aortic Valve: The aortic valve is tricuspid. There is moderate calcification of the aortic valve. Aortic valve regurgitation is trivial. No aortic stenosis is present.  Pulmonic Valve: The pulmonic valve was normal in structure. Pulmonic valve regurgitation is not visualized.  Aorta: Aortic dilatation noted. There is mild dilatation of the ascending aorta, measuring 38 mm.  Venous: The inferior vena cava is normal in size with  greater than 50% respiratory variability, suggesting right atrial pressure of 3 mmHg.  IAS/Shunts: No atrial level shunt detected by color flow Doppler.   LEFT VENTRICLE PLAX 2D LVIDd:         4.20 cm Diastology LVIDs:         2.90 cm LV e' medial:    5.70 cm/s LV PW:         1.00 cm LV E/e' medial:  13.1 LV IVS:        1.20 cm LV e' lateral:   8.86 cm/s LV E/e' lateral: 8.5   RIGHT VENTRICLE RV Basal diam:  2.70 cm RV S prime:     8.11 cm/s TAPSE (M-mode): 1.2 cm RVSP:           23.6 mmHg  LEFT ATRIUM             Index        RIGHT ATRIUM           Index LA diam:        4.10 cm 1.93 cm/m   RA Pressure: 3.00 mmHg LA Vol (A2C):   67.2 ml 31.64 ml/m  RA Area:     10.70 cm LA Vol (A4C):   40.9 ml 19.26 ml/m  RA Volume:   20.20 ml  9.51 ml/m LA Biplane Vol: 56.8 ml 26.74  ml/m AORTIC VALVE LVOT Vmax:   82.20 cm/s LVOT Vmean:  56.500 cm/s LVOT VTI:    0.196 m  AORTA Ao Asc diam: 3.80 cm  MITRAL VALVE               TRICUSPID VALVE MV Area (PHT): 2.76 cm    TR Peak grad:   20.6 mmHg MV Decel Time: 275 msec    TR Vmax:        227.00 cm/s MV E velocity: 74.90 cm/s  Estimated RAP:  3.00 mmHg MV A velocity: 85.90 cm/s  RVSP:           23.6 mmHg MV E/A ratio:  0.87 SHUNTS Systemic VTI: 0.20 m  Dalton McleanMD Electronically signed by Wilfred Lacy Signature Date/Time: 05/04/2022/5:48:13 PM    Final   MONITORS  CARDIAC EVENT MONITOR 06/29/2017  Narrative The baseline rhythm is normal sinus There are occasional PVC's There is no atrial fibrillation There are no significant bradycardic events           EKG:   EKG Interpretation Date/Time:  Thursday September 28 2023 10:44:49 EST Ventricular Rate:  66 PR Interval:  202 QRS Duration:  88 QT Interval:  438 QTC Calculation: 459 R Axis:   26  Text Interpretation: Normal sinus rhythm Anteroseptal infarct , age undetermined When compared with ECG of 22-May-2020 09:28, No significant change was found Confirmed by Tonny Bollman [phone]) on 09/28/2023 10:56:10 AM    Recent Labs: No results found for requested labs within last 365 days.  Recent Lipid Panel    Component Value Date/Time   CHOL 90 01/03/2015 0210   TRIG 77 01/03/2015 0210   HDL 15 (L) 01/03/2015 0210   CHOLHDL 6.0 01/03/2015 0210   VLDL 15 01/03/2015 0210   LDLCALC 60 01/03/2015 0210     Risk Assessment/Calculations:                Physical Exam:    VS:  BP 134/88   Pulse 60   Ht 5\' 8"  (1.727 m)   Wt 220 lb (99.8 kg)   SpO2 93%   BMI 33.45 kg/m  Wt Readings from Last 3 Encounters:  09/28/23 220 lb (99.8 kg)  09/08/23 227 lb (103 kg)  09/05/23 227 lb (103 kg)     GEN:  Well nourished, well developed in no acute distress HEENT: Normal NECK: No JVD; No carotid bruits LYMPHATICS: No  lymphadenopathy CARDIAC: RRR, 2/6 systolic ejection murmur at the right upper sternal border RESPIRATORY:  Clear to auscultation without rales, wheezing or rhonchi  ABDOMEN: Soft, non-tender, non-distended MUSCULOSKELETAL: 1+ bilateral ankle edema; No deformity  SKIN: Warm and dry NEUROLOGIC:  Alert and oriented x 3 PSYCHIATRIC:  Normal affect   Assessment & Plan Essential hypertension Blood pressure is controlled on losartan, metoprolol, and spironolactone.  Continue current management.  Recent labs reviewed.  Creatinine is 0.8 and potassium is 4.3. Coronary artery disease involving coronary bypass graft of native heart with other forms of angina pectoris (HCC) Controlled on metoprolol and isosorbide.  Continue aspirin, clopidogrel, and high intensity statin drug. Mixed hyperlipidemia Treated with atorvastatin 40 mg daily.  Lipids from 01/03/2023 with an LDL cholesterol of 66.  The patient appears stable from a cardiac perspective.  She now has uncontrolled diabetes and needs to really work on her glycemic control.  She is just getting started on insulin and will work on diet and lifestyle modification.  She is being followed by endocrinology.            Medication Adjustments/Labs and Tests Ordered: Current medicines are reviewed at length with the patient today.  Concerns regarding medicines are outlined above.  Orders Placed This Encounter  Procedures   EKG 12-Lead   No orders of the defined types were placed in this encounter.   Patient Instructions  Follow-Up: At [facility], you and your health needs are our priority.  As part of our continuing mission to provide you with exceptional heart care, we have created designated Provider Care Teams.  These Care Teams include your primary Cardiologist (physician) and Advanced Practice Providers (APPs -  Physician Assistants and Nurse Practitioners) who all work together to provide you with the care you need, when you need  it.  We recommend signing up for the patient portal called "MyChart".  Sign up information is provided on this After Visit Summary.  MyChart is used to connect with patients for Virtual Visits (Telemedicine).  Patients are able to view lab/test results, encounter notes, upcoming appointments, etc.  Non-urgent messages can be sent to your provider as well.   To learn more about what you can do with MyChart, go to [email or web address].    Your next appointment:   6 month(s)  Provider:   Tonny Bollman, MD     Other Instructions   1st Floor: - Lobby - Registration  - Pharmacy  - Lab - Cafe  2nd Floor: - PV Lab - Diagnostic Testing (echo, CT, nuclear med)  3rd Floor: - Vacant  4th Floor: - TCTS (cardiothoracic surgery) - AFib Clinic - Structural Heart Clinic - Vascular Surgery  - Vascular Ultrasound  5th Floor: - HeartCare Cardiology (general and EP) - Clinical Pharmacy for coumadin, hypertension, lipid, weight-loss medications, and med management appointments    Valet parking services will be available as well.          Signed, Tonny Bollman, MD  09/29/2023 5:24 PM    [facility]

## 2023-09-29 NOTE — Assessment & Plan Note (Signed)
Controlled on metoprolol and isosorbide.  Continue aspirin, clopidogrel, and high intensity statin drug.

## 2023-09-29 NOTE — Assessment & Plan Note (Signed)
Treated with atorvastatin 40 mg daily.  Lipids from 01/03/2023 with an LDL cholesterol of 66.  The patient appears stable from a cardiac perspective.  She now has uncontrolled diabetes and needs to really work on her glycemic control.  She is just getting started on insulin and will work on diet and lifestyle modification.  She is being followed by endocrinology.

## 2023-09-29 NOTE — Assessment & Plan Note (Signed)
Blood pressure is controlled on losartan, metoprolol, and spironolactone.  Continue current management.  Recent labs reviewed.  Creatinine is 0.8 and potassium is 4.3.

## 2023-10-19 ENCOUNTER — Other Ambulatory Visit: Payer: Self-pay | Admitting: Cardiovascular Disease

## 2023-10-26 ENCOUNTER — Telehealth: Payer: Self-pay | Admitting: Cardiovascular Disease

## 2023-10-26 MED ORDER — SPIRONOLACTONE 25 MG PO TABS: 25.0000 mg | ORAL_TABLET | Freq: Every day | ORAL | 3 refills | Status: DC

## 2023-10-26 NOTE — Telephone Encounter (Signed)
*  STAT* If patient is at the pharmacy, call can be transferred to refill team.   1. Which medications need to be refilled? (please list name of each medication and dose if known) spironolactone (ALDACTONE) 25 MG tablet   2. Which pharmacy/location (including street and city if local pharmacy) is medication to be sent to? [id number] [place], [place] - [address] [facility] [address] Phone: [phone]  Fax: [phone]     3. Do they need a 30 day or 90 day supply? 90 Pt is out of medication

## 2023-10-26 NOTE — Telephone Encounter (Signed)
 Pt's medication was sent to pt's pharmacy as requested. Confirmation received.

## 2023-11-02 ENCOUNTER — Telehealth: Payer: Self-pay | Admitting: Cardiovascular Disease

## 2023-11-02 NOTE — Telephone Encounter (Signed)
I did not need this encounter. °

## 2024-01-03 ENCOUNTER — Ambulatory Visit: Payer: Self-pay | Admitting: *Deleted

## 2024-01-03 ENCOUNTER — Encounter: Payer: Self-pay | Admitting: Cardiovascular Disease

## 2024-01-03 ENCOUNTER — Other Ambulatory Visit: Payer: Self-pay | Admitting: Cardiovascular Disease

## 2024-01-03 DIAGNOSIS — I25708 Atherosclerosis of coronary artery bypass graft(s), unspecified, with other forms of angina pectoris: Secondary | ICD-10-CM

## 2024-01-03 NOTE — Telephone Encounter (Signed)
 Patient identification verified by 2 forms. Sheri Lucky, RN    Called and spoke to patient  Patient states:   -last week she had chest pain   -Saturday was last episode of chest pain   -pain occurs about 3x a week   -pain is relieved with NTG   -she is not sure if chest pain is related to acid reflux   -pain particularly occurs after eating   -pain will also occur after activity and also at rest  Pain denies:   -SOB/difficulty breathing with episodes  Patient scheduled for OV 5/2 at 3:00pm with Dr. Filiberto Hug (DOD)  Informed patient:   -since has not had pain since Saturday, monitor symptoms at home   -if pain returns or worsens present to ED for evaluation  Patient verbalized understanding, no questions at this time

## 2024-01-03 NOTE — Telephone Encounter (Signed)
 [place]- Caregiver calling  to report patient has been having chest pain  Chief Complaint: chest pain- comes and goes- lasts seconds and varies in intensity Symptoms: Patient reports she has hx of CABGx2 and uses nitroglycerin  for chest pain- she also has GERD. Patient reports she has been traveling and not following diet- she has transient chest pain that goes and goes and appears in different areas of her chest. Patient states she has been using Nitroglycerin  for pain and it does help the pain.  Frequency: last chest pain episode- Saturday Pertinent Negatives: Patient denies chest pain now Disposition: [x] ED /[] Urgent Care (no appt availability in office) / [] Appointment(In office/virtual)/ []  [facility]/ [] Home Care/ [] Refused Recommended Disposition /[] [place] [facility]/ []  Follow-up with PCP Additional Notes: Due to her symptoms - ED is advised - patient declines and will report her symptoms and need for refill of her medication to her cardiologist.  She states her PCP does not handle her cardiac needs- but she does want an appointment. Patient advised I can not schedule appointment without PCP permission when ED is advised - will send message to office for scheduling request.   Copied from CRM [id number]. Topic: Clinical - Red Word Triage >> Jan 03, 2024  3:21 PM Marissa P wrote: Red Word that prompted transfer to Nurse Triage: Patient is experiencing chest pain Reason for Disposition  [1] Chest pain (or "angina") comes and goes AND [2] is happening more often (increasing in frequency) or getting worse (increasing in severity)  (Exception: Chest pains that last only a few seconds.)  Answer Assessment - Initial Assessment Questions 1. LOCATION: "Where does it hurt?"       Varies- all over- arm pit, near throat- into the throat 2. RADIATION: "Does the pain go anywhere else?" (e.g., into neck, jaw, arms, back)     Sometimes into the neck. , elbow, under the arm 3. ONSET: "When  did the chest pain begin?" (Minutes, hours or days)      Last week-over eating- so patient has been taking nitroglycerin   4. PATTERN: "Does the pain come and go, or has it been constant since it started?"  "Does it get worse with exertion?"      Comes and goes, lasting sometimes a wave- seconds 5. DURATION: "How long does it last" (e.g., seconds, minutes, hours)     See above 6. SEVERITY: "How bad is the pain?"  (e.g., Scale 1-10; mild, moderate, or severe)    - MILD (1-3): doesn't interfere with normal activities     - MODERATE (4-7): interferes with normal activities or awakens from sleep    - SEVERE (8-10): excruciating pain, unable to do any normal activities       Varies- 10/10 7. CARDIAC RISK FACTORS: "Do you have any history of heart problems or risk factors for heart disease?" (e.g., angina, prior heart attack; diabetes, high blood pressure, high cholesterol, smoker, or strong family history of heart disease)     CAD 8. PULMONARY RISK FACTORS: "Do you have any history of lung disease?"  (e.g., blood clots in lung, asthma, emphysema, birth control pills)     lung 9. CAUSE: "What do you think is causing the chest pain?"     unsure 10. OTHER SYMPTOMS: "Do you have any other symptoms?" (e.g., dizziness, nausea, vomiting, sweating, fever, difficulty breathing, cough)       no  Protocols used: Chest Pain-A-AH

## 2024-01-04 ENCOUNTER — Telehealth: Payer: Self-pay

## 2024-01-04 NOTE — Telephone Encounter (Signed)
 Copied from CRM [id number]. Topic: Appointments - Appointment Info/Confirmation >> Jan 03, 2024  5:08 PM Everette C wrote: Patient/patient representative is calling for information regarding an appointment.   The patient wanted to notify staff member Laraine Plate P. That they have an appointment on Friday 01/05/24 at 3 with Dr. Filiberto Hug and they appreciate all of their time and effort

## 2024-01-05 ENCOUNTER — Encounter: Payer: Self-pay | Admitting: Cardiology

## 2024-01-05 ENCOUNTER — Ambulatory Visit

## 2024-01-05 ENCOUNTER — Ambulatory Visit: Attending: Cardiology | Admitting: Cardiology

## 2024-01-05 VITALS — BP 110/72 | HR 69 | Ht 68.0 in | Wt 222.2 lb

## 2024-01-05 DIAGNOSIS — R002 Palpitations: Secondary | ICD-10-CM | POA: Diagnosis not present

## 2024-01-05 DIAGNOSIS — R072 Precordial pain: Secondary | ICD-10-CM

## 2024-01-05 DIAGNOSIS — I25118 Atherosclerotic heart disease of native coronary artery with other forms of angina pectoris: Secondary | ICD-10-CM

## 2024-01-05 DIAGNOSIS — R079 Chest pain, unspecified: Secondary | ICD-10-CM

## 2024-01-05 MED ORDER — PANTOPRAZOLE SODIUM 40 MG PO TBEC: 40.0000 mg | DELAYED_RELEASE_TABLET | Freq: Every day | ORAL | 1 refills | Status: DC

## 2024-01-05 NOTE — Progress Notes (Unsigned)
 Applied a 14 day Zio XT monitor to patient in the office ?

## 2024-01-05 NOTE — Progress Notes (Unsigned)
  Cardiology Office Note:  .   Date:  01/05/2024  ID:  Sheri Hernandez, DOB 20-Jan-1956, MRN [id number] PCP: Austine Lefort, MD  [facility] Providers Cardiologist:  Fransico Ivy, MD PCP: Austine Lefort, MD  No chief complaint on file.    Sheri Hernandez is a 68 y.o. female with hypertension, hyperlipidemia, CAD, h/o metastatic renal cell carcinoma s/p left nephrectomy-now in remission  Patient had non-STEMI in 2016, underwent attempted complex PCI to severe stenoses in RCA complicated by extensive dissection requiring emergency CABG with SVG-PDA/PLA.  Subsequently, she had recurrent anginal symptoms, heart catheterization showed occlusion of her bypass graft.  The RCA remained patent with tight residual stenosis for which medical management was recommended.  Patient was last seen by Dr. Arlester Ladd in 09/2023.  At that time, her angina symptoms were very well-controlled.       There were no vitals filed for this visit.    ROS      Studies Reviewed: Aaron Aas        EKG 01/05/2024: Normal sinus rhythm Anterior infarct (cited on or before 28-Sep-2023) When compared with ECG of 28-Sep-2023 10:44, No significant change was found  Coronary angiography 2018: Severe two-vessel disease (proximal RCA CTO with occluded SVG-RCA graft, severe stenosis proximal left circumflex Obstructive disease in left main and LAD    Independently interpreted (External labs) 12/2022-09/2023: Chol 122, TG 215, HDL 28, LDL 66 HbA1C 9.5% Hb 14.2 Cr 0.83 TSH 2.4  Risk Assessment/Calculations:   {Does this patient have ATRIAL FIBRILLATION?[phone]}    Physical Exam   VISIT DIAGNOSES: No diagnosis found.   Sheri Hernandez is a 68 y.o. female with *** Assessment and Plan Assessment & Plan       {Are you ordering a CV Procedure (e.g. stress test, cath, DCCV, TEE, etc)?   Press F2        :[id number]}    No orders of the defined types were placed in this encounter.     F/u in ***  Signed, Cody Das, MD

## 2024-01-05 NOTE — Patient Instructions (Signed)
 Medication Instructions:  START Pantoprazole  40 mg take one tablet by mouth daily   *If you need a refill on your cardiac medications before your next appointment, please call your pharmacy*  Testing/Procedures: Zio patch 14 days   Your physician has requested that you wear a Zio heart monitor for _14____ days. This will be mailed to your home with instructions on how to apply the monitor and how to return it when finished. Please allow 2 weeks after returning the heart monitor before our office calls you with the results.   Follow-Up: At [facility], you and your health needs are our priority.  As part of our continuing mission to provide you with exceptional heart care, our providers are all part of one team.  This team includes your primary Cardiologist (physician) and Advanced Practice Providers or APPs (Physician Assistants and Nurse Practitioners) who all work together to provide you with the care you need, when you need it.  Your next appointment:   4-6 week   Provider:   Dr. Arlester Ladd      We recommend signing up for the patient portal called "MyChart".  Sign up information is provided on this After Visit Summary.  MyChart is used to connect with patients for Virtual Visits (Telemedicine).  Patients are able to view lab/test results, encounter notes, upcoming appointments, etc.  Non-urgent messages can be sent to your provider as well.   To learn more about what you can do with MyChart, go to [email or web address].

## 2024-01-07 ENCOUNTER — Encounter: Payer: Self-pay | Admitting: Cardiology

## 2024-01-07 DIAGNOSIS — R002 Palpitations: Secondary | ICD-10-CM | POA: Insufficient documentation

## 2024-01-19 ENCOUNTER — Telehealth: Payer: Self-pay | Admitting: Cardiovascular Disease

## 2024-01-19 NOTE — Telephone Encounter (Signed)
 Pt requesting cb to discuss issues she had while wearing monitor, therefore not sure if info will be useful?????

## 2024-01-19 NOTE — Telephone Encounter (Signed)
 Patient identification verified by 2 forms. Sims Duck, RN     Called and spoke to patient  Patient states:  - She has been forgetting to write down her symptoms while wearing the monitor. She has been pushing the button. She is concerned the information would not be recorded correctly.  - She also had questions regarding appt with Dr. Arlester Ladd that she has had difficulty getting scheduled.   Patient denies:              Interventions/Plan: - Explained to patient that the monitor will record as long as she's wearing it and then the provider will be able to review the information. The booklet while a good source of information is in addition to her pushing the button and the strips the provider will review so she should keep the monitor on.  - Chart review shows recall in system for 6 mth f/u.    Patient agrees with plan, no questions at this time

## 2024-02-04 ENCOUNTER — Ambulatory Visit: Payer: Self-pay | Admitting: Cardiology

## 2024-02-04 DIAGNOSIS — R002 Palpitations: Secondary | ICD-10-CM

## 2024-02-04 NOTE — Progress Notes (Signed)
 Rare episodes of SVT, NSVT. From palpitations standpoint, continue current medical management for now. Seeing you on 6/11.  Thanks MJP

## 2024-02-13 NOTE — Progress Notes (Signed)
 Cardiology Office Note    Patient Name: Sheri Hernandez Date of Encounter: 02/13/2024  Primary Care Provider:  Austine Lefort, MD Primary Cardiologist:  Arnoldo Lapping, MD Primary Electrophysiologist: None   Past Medical History    Past Medical History:  Diagnosis Date   Acute blood loss anemia 01/12/2015   Adenocarcinoma, renal cell (HCC) 11/12/2012   Overview:   09/25/12 - CXR - lung masses  CT CAP - rt hilar mass, mult pulm nodules, LLL mass 3 x 3.1 cm, RLL mass 2.2 x 2.0 cm, left kidney mass 12.4 x 11.1 x 10.3 cm  10/05/12 PET - left lung mass 30 mm SUV 16.7, rt lung nodule 21 mm, SUV 10.4, left kidney mass 7 cm SUV 18, mass compressing RLL bronchus  10/11/12 lung biopsy - atypical cells  10/23/12 left kidney biopsy - high gr clear cell RCC    Anxiety    Arthritis    maybe in my right hand (05/26/2017)   CAD (coronary artery disease)    a. 12/2014 Cath: LM nl, LAD 20p, 33m, D2 20ost, LCX 95p, 52m, RCA dominant, 95p, 23m/d, RPDA 95, RPL2 50, attempted RCA PCI w/ acute RCA occlusion-->CABG x 2 VG->RPDA->PLVB.   Chronic diastolic CHF (congestive heart failure) (HCC)    Drug-induced hypothyroidism 03/13/2014   GERD (gastroesophageal reflux disease) 03/18/2015   Hyperglycemia 01/12/2015   Hypertension    Hypoalbuminemia    Hypothyroidism    Lower extremity edema    Lung mass    LUNG MASSES--COUGH-BIOPSY NON-DIAGNOSTIC.  HX OF MEDIASTINAL MASS AND LUNG MASSES AGE 11 -NEGATIVE BIOSPY-BUT GIVEN DIAGNOSIS OF SARCOIDOSIS.  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   Papilloma of breast 2012   left. lumpectomy   Pulmonary nodules     Renal cell cancer (HCC)    metastatic renal cell carcinoma (biopsy of left renal mass 10/2012) On nivolumab  at [facility]   Renal mass 10/01/2012   10/23/12 Bx Pos high grad clear cell renal cell ca> referred to oncology 10/26/2012     S/p nephrectomy    left, due to metastatic RCC, 3/14    History of Present Illness  Sheri Hernandez is a 68 y.o. female with a PMH of CAD s/p NSTEMI 2016 with emergent CABG x 2, metastatic renal cell carcinoma s/p left nephrectomy, HLD, hypothyroidism, HTN, GERD, HFpEF, breast CA s/p lumpectomy who presents today for 1 month follow-up.  Ms. Sheralyn Dies was seen initially in 2016 with NSTEMI treated by LHC complex PCI to severe stenosed RCA complicated by extensive dissection requiring emergent CABG with SVG-PDA/PLA.  She had recurrent anginal symptoms and underwent repeat LHC in 2018 with CTO of RCA and occlusion of bypass graft with medical management recommended.  She was seen last by Dr. Patwarhdan for complaint of chest pain and shortness of breath.  She described discomfort as chest and throat pain relieved with nitroglycerin  and EKG in the office was normal.  She reported discomfort being brought on by food and possible esophageal spasm.  She was started on pantoprazole  40 mg and patient wore a 14-day ZIO monitor  for occasional palpitations that showed rare episodes of SVT and NSVT with advisement to continue current medical management.  Ms. Clearence Curet presents today for 1 month follow-up.She started taking Protonix , which has significantly reduced her pain. The discomfort typically occurred about fifteen minutes after eating. She also experienced palpitations and was monitored with a Zio patch, which showed one episode of fast heart beating originating in the top of the heart, but no atrial fibrillation or dangerous rhythms. She had difficulty remembering to press the monitor button during episodes of pain. There has been a reduction in palpitations since starting the new  medication, with only one episode requiring nitroglycerin  since her last visit. She describes occasional pinching sensations in her chest, which are not associated with activity and do not feel like they are moving or traveling. She recently started taking Ozempic and has experienced heaviness in her chest and nausea, which she attributes to the medication. She also reports constipation. She has a history of swelling since her bypass surgery, which is managed with diuretics. The swelling has been more noticeable since starting Ozempic. Patient denies chest pain, palpitations, dyspnea, PND, orthopnea, nausea, vomiting, dizziness, syncope, edema, weight gain, or early satiety.  Discussed the use of AI scribe software for clinical note transcription with the patient, who gave verbal consent to proceed.  History of Present Illness   Review of Systems  Please see the history of present illness.    All other systems reviewed and are otherwise negative except as noted above.  Physical Exam     Wt Readings from Last 3 Encounters:  01/05/24 222 lb 3.2 oz (100.8 kg)  09/28/23 220 lb (99.8 kg)  09/08/23 227 lb (103 kg)   ZO:XWRUE were no vitals filed for this visit.,There is no height or weight on file to calculate BMI. GEN: Well nourished, well developed in no acute distress Neck: No JVD; No carotid bruits Pulmonary: Clear to auscultation without rales, wheezing or rhonchi  Cardiovascular: Normal rate. Regular rhythm. Normal S1. Normal S2.   Murmurs: There is no murmur.  ABDOMEN: Soft, non-tender, non-distended EXTREMITIES:  No edema; No deformity   EKG/LABS/ Recent Cardiac Studies   ECG personally reviewed by me today -none completed today  Risk Assessment/Calculations:          Lab Results  Component Value Date   WBC 11.6 (H) 05/22/2020   HGB 16.0 (H) 05/22/2020   HCT 47.0 (H) 05/22/2020   MCV 89.2 05/22/2020   PLT 343 05/22/2020   Lab Results  Component Value Date   CREATININE  0.70 05/22/2020   BUN 17 05/22/2020   NA 143 05/22/2020   K 4.7 05/22/2020   CL 104 05/22/2020   CO2 25 05/22/2020   Lab Results  Component Value Date   CHOL 90 01/03/2015   HDL 15 (L) 01/03/2015   LDLCALC 60 01/03/2015   TRIG 77 01/03/2015   CHOLHDL 6.0 01/03/2015    Lab Results  Component Value Date   HGBA1C 6.1 (H) 01/03/2015   Assessment & Plan   Assessment & Plan   1.  Chest pain: Intermittent chest discomfort and throat pain likely related to GERD. Symptoms improved with pantoprazole . Cardiac causes ruled out. - Continue pantoprazole  (Protonix ).  2.  History of CAD: -s/p NSTEMI 2016 with emergent CABG x 2 with no current indication for ischemic evaluation - Patient reports no chest pain or angina since prior follow-up. - Continue Imdur  60 mg twice daily, Lipitor  40 mg daily, ASA 81 mg, Plavix  75  mg, Lopressor  100 mg twice daily,.  Nitrostat  0.4 mg  3.  Essential hypertension: - Patient's blood pressure today was stable at 126/83 - Continue losartan  100 mg, Lopressor  100 mg twice daily, spironolactone  25 mg and Imdur  60 mg twice daily  4.  Hyperlipidemia: - Patient's last LDL cholesterol was - Continue Lipitor  40 mg daily  5.  Palpitations: Palpitations and tachycardia monitored with Zio patch. No atrial fibrillation or dangerous arrhythmias. Symptoms decreased with pantoprazole , suggesting link to GERD.   Disposition: Follow-up with Arnoldo Lapping, MD or APP as scheduled    Signed, Francene Ing, Retha Cast, NP 02/13/2024, 7:26 PM [facility]

## 2024-02-14 ENCOUNTER — Encounter: Payer: Self-pay | Admitting: Nurse Practitioner

## 2024-02-14 ENCOUNTER — Ambulatory Visit: Attending: Cardiology | Admitting: Nurse Practitioner

## 2024-02-14 VITALS — BP 126/83 | HR 73 | Ht 68.0 in | Wt 220.0 lb

## 2024-02-14 DIAGNOSIS — R072 Precordial pain: Secondary | ICD-10-CM | POA: Diagnosis not present

## 2024-02-14 DIAGNOSIS — I25118 Atherosclerotic heart disease of native coronary artery with other forms of angina pectoris: Secondary | ICD-10-CM

## 2024-02-14 DIAGNOSIS — E1165 Type 2 diabetes mellitus with hyperglycemia: Secondary | ICD-10-CM

## 2024-02-14 DIAGNOSIS — I10 Essential (primary) hypertension: Secondary | ICD-10-CM

## 2024-02-14 DIAGNOSIS — E782 Mixed hyperlipidemia: Secondary | ICD-10-CM | POA: Diagnosis not present

## 2024-02-14 NOTE — Patient Instructions (Signed)
 Medication Instructions:  Your physician recommends that you continue on your current medications as directed. Please refer to the Current Medication list given to you today. *If you need a refill on your cardiac medications before your next appointment, please call your pharmacy*  Lab Work: None ordered If you have labs (blood work) drawn today and your tests are completely normal, you will receive your results only by: MyChart Message (if you have MyChart) OR A paper copy in the mail If you have any lab test that is abnormal or we need to change your treatment, we will call you to review the results.  Testing/Procedures: None Ordered  Follow-Up: At [facility], you and your health needs are our priority.  As part of our continuing mission to provide you with exceptional heart care, our providers are all part of one team.  This team includes your primary Cardiologist (physician) and Advanced Practice Providers or APPs (Physician Assistants and Nurse Practitioners) who all work together to provide you with the care you need, when you need it.  Your next appointment:   FIRST AVAILABLE   Provider:   Arnoldo Lapping, MD    We recommend signing up for the patient portal called MyChart.  Sign up information is provided on this After Visit Summary.  MyChart is used to connect with patients for Virtual Visits (Telemedicine).  Patients are able to view lab/test results, encounter notes, upcoming appointments, etc.  Non-urgent messages can be sent to your provider as well.   To learn more about what you can do with MyChart, go to [email or web address].   Other Instructions

## 2024-03-06 ENCOUNTER — Other Ambulatory Visit: Payer: Self-pay | Admitting: Cardiology

## 2024-03-14 ENCOUNTER — Telehealth: Payer: Self-pay | Admitting: Cardiovascular Disease

## 2024-03-14 MED ORDER — LOSARTAN POTASSIUM 100 MG PO TABS: 100.0000 mg | ORAL_TABLET | Freq: Every day | ORAL | 3 refills | Status: AC

## 2024-03-14 MED ORDER — SPIRONOLACTONE 25 MG PO TABS: 25.0000 mg | ORAL_TABLET | Freq: Every day | ORAL | 3 refills | Status: AC

## 2024-03-14 MED ORDER — METOPROLOL TARTRATE 100 MG PO TABS: 100.0000 mg | ORAL_TABLET | Freq: Two times a day (BID) | ORAL | 3 refills | Status: AC

## 2024-03-14 NOTE — Telephone Encounter (Signed)
*  STAT* If patient is at the pharmacy, call can be transferred to refill team.   1. Which medications need to be refilled? (please list name of each medication and dose if known) metoprolol  tartrate (LOPRESSOR ) 100 MG tablet  spironolactone  (ALDACTONE ) 25 MG tablet  losartan  (COZAAR ) 100 MG tablet    2. Would you like to learn more about the convenience, safety, & potential cost savings by using the [facility]?    3. Are you open to using the [facility] (Type [facility].  ).   4. Which pharmacy/location (including street and city if local pharmacy) is medication to be sent to?[id number] [place], [place] - [address]    5. Do they need a 30 day or 90 day supply? 90 day

## 2024-03-14 NOTE — Telephone Encounter (Signed)
 Pt's medications were sent to pt's pharmacy as requested. Confirmation received.

## 2024-03-28 ENCOUNTER — Ambulatory Visit: Payer: Medicare Other | Admitting: *Deleted

## 2024-03-28 VITALS — Ht 68.0 in | Wt 217.0 lb

## 2024-03-28 DIAGNOSIS — Z0000 Encounter for general adult medical examination without abnormal findings: Secondary | ICD-10-CM | POA: Diagnosis not present

## 2024-03-28 NOTE — Progress Notes (Signed)
 Subjective:   Sheri Hernandez is a 68 y.o. female who presents for Medicare Annual (Subsequent) preventive examination.  Visit Complete: Virtual I connected with  Sheri Hernandez on 03/28/24 by a audio enabled telemedicine application and verified that I am speaking with the correct person using two identifiers.  Patient Location: Home  Provider Location: Home Office  I discussed the limitations of evaluation and management by telemedicine. The patient expressed understanding and agreed to proceed.  Vital Signs: Because this visit was a virtual/telehealth visit, some criteria may be missing or patient reported. Any vitals not documented were not able to be obtained and vitals that have been documented are patient reported.   Cardiac Risk Factors include: advanced age (>47men, >28 women);diabetes mellitus;hypertension;obesity (BMI >30kg/m2)     Objective:    Today's Vitals   03/28/24 1129  Weight: 217 lb (98.4 kg)  Height: 5' 8 (1.727 m)   Body mass index is 32.99 kg/m.     03/28/2024   11:26 AM 03/23/2023    1:44 PM 03/04/2022   11:17 AM 02/23/2021    2:46 PM 02/23/2021    2:45 PM 05/26/2017    7:58 PM 10/10/2016    9:37 AM  Advanced Directives  Does Patient Have a Medical Advance Directive? Yes No No No No Yes  Yes   Type of Advance Directive Living will     Healthcare Power of [employer] of [place];Living will  Does patient want to make changes to medical advance directive?      No - Patient declined  No - Patient declined   Copy of Healthcare Power of Attorney in Chart?      No - copy requested  No - copy requested   Would patient like information on creating a medical advance directive?  Yes (MAU/Ambulatory/Procedural Areas - Information given) No - Patient declined No - Patient declined  No - Patient declined       Data saved with a previous flowsheet row definition    Current Medications (verified) Outpatient Encounter Medications as of 03/28/2024   Medication Sig   acetaminophen  (TYLENOL ) 500 MG tablet Take 500-1,000 mg by mouth every 6 (six) hours as needed for mild pain or headache.   aspirin  81 MG EC tablet Take 1 tablet (81 mg total) by mouth daily.   atorvastatin  (LIPITOR ) 40 MG tablet TAKE 1 TABLET BY MOUTH EVERY DAY   Blood Glucose Monitoring Suppl (BLOOD GLUCOSE SYSTEM PAK) KIT Please dispense based on patient and insurance preference. Use as directed to monitor FSBS 4x daily due to uncontrolled diabetes. Dx: E11.65   Cholecalciferol (VITAMIN D-3) 25 MCG (1000 UT) CAPS Take 1,000 Units by mouth daily with breakfast.   clopidogrel  (PLAVIX ) 75 MG tablet TAKE 1 TABLET BY MOUTH EVERY DAY   Continuous Blood Gluc Receiver (FREESTYLE LIBRE 2 READER) DEVI    Continuous Blood Gluc Sensor (FREESTYLE LIBRE 3 SENSOR) MISC SMARTSIG:1 Topical Every 2 Weeks   glipiZIDE  (GLUCOTROL  XL) 10 MG 24 hr tablet TAKE 1 TABLET (10 MG TOTAL) BY MOUTH DAILY WITH BREAKFAST.   Glucose Blood (BLOOD GLUCOSE TEST STRIPS) STRP Please dispense based on patient and insurance preference. Use as directed to monitor FSBS 4x daily due to uncontrolled diabetes. Dx: E11.65   insulin  glargine (LANTUS) 100 UNIT/ML injection Inject 10 Units into the skin daily.   isosorbide  mononitrate (IMDUR ) 60 MG 24 hr tablet TAKE 2 TABLETS BY MOUTH EVERY DAY   Lancets MISC Please dispense based on patient and  insurance preference. Use as directed to monitor FSBS 4x daily due to uncontrolled diabetes. Dx: E11.65   levothyroxine  (SYNTHROID ) 125 MCG tablet Take 1 tablet by mouth daily.   lidocaine -prilocaine  (EMLA ) cream Apply 1 application topically See admin instructions. APPLY CREAM TO PORTACATH SITE APPROXIMATELY 1 HOUR BEFORE ACCESS AND COVER   losartan  (COZAAR ) 100 MG tablet Take 1 tablet (100 mg total) by mouth daily.   metoprolol  tartrate (LOPRESSOR ) 100 MG tablet Take 1 tablet (100 mg total) by mouth 2 (two) times daily.   nitroGLYCERIN  (NITROSTAT ) 0.4 MG SL tablet PLACE 1 TABLET  UNDER THE TONGUE EVERY 5 MINUTES AS NEEDED FOR CHEST PAIN.   ondansetron  (ZOFRAN  ODT) 4 MG disintegrating tablet Take 1 tablet (4 mg total) by mouth every 8 (eight) hours as needed for nausea or vomiting.   OZEMPIC, 0.25 OR 0.5 MG/DOSE, 2 MG/3ML SOPN    pantoprazole  (PROTONIX ) 40 MG tablet TAKE 1 TABLET BY MOUTH EVERY DAY   spironolactone  (ALDACTONE ) 25 MG tablet Take 1 tablet (25 mg total) by mouth daily.   No facility-administered encounter medications on file as of 03/28/2024.    Allergies (verified) Silver and Tape   History: Past Medical History:  Diagnosis Date   Acute blood loss anemia 01/12/2015   Adenocarcinoma, renal cell (HCC) 11/12/2012   Overview:   09/25/12 - CXR - lung masses  CT CAP - rt hilar mass, mult pulm nodules, LLL mass 3 x 3.1 cm, RLL mass 2.2 x 2.0 cm, left kidney mass 12.4 x 11.1 x 10.3 cm  10/05/12 PET - left lung mass 30 mm SUV 16.7, rt lung nodule 21 mm, SUV 10.4, left kidney mass 7 cm SUV 18, mass compressing RLL bronchus  10/11/12 lung biopsy - atypical cells  10/23/12 left kidney biopsy - high gr clear cell RCC    Anxiety    Arthritis    maybe in my right hand (05/26/2017)   CAD (coronary artery disease)    a. 12/2014 Cath: LM nl, LAD 20p, 52m, D2 20ost, LCX 95p, 35m, RCA dominant, 95p, 36m/d, RPDA 95, RPL2 50, attempted RCA PCI w/ acute RCA occlusion-->CABG x 2 VG->RPDA->PLVB.   Chronic diastolic CHF (congestive heart failure) (HCC)    Drug-induced hypothyroidism 03/13/2014   GERD (gastroesophageal reflux disease) 03/18/2015   Hyperglycemia 01/12/2015   Hypertension    Hypoalbuminemia    Hypothyroidism    Lower extremity edema    Lung mass    LUNG MASSES--COUGH-BIOPSY NON-DIAGNOSTIC.  HX OF MEDIASTINAL MASS AND LUNG MASSES AGE 37 -NEGATIVE BIOSPY-BUT GIVEN DIAGNOSIS OF SARCOIDOSIS.  WORK UP CONTINUING ON DIAGNOSIS FOR PT'S LUNG MASSES--SHE HAS BIOPSY PROVEN KIDNEY CANCER   Mass of left breast 08/01/2011   Myocardial infarction (HCC) 12/2014 X 2   Pain    HX  OF EPISODES OF BREIF PAIN BACK OF HEAD-RIGHT SIDE- OCCURS WHEN PT TURNS HER HEAD TO RIGHT--BUT DOESN'T HAPPEN EVERY TIME SHE TURNS HER HEAD TO RIGHT--SHE HAS HAD ALL HER LIFE AND STATES PAST WORK [employer]- NEGATIVE FOR ANY BRAIN ISSUES.   Papilloma of breast 2012   left. lumpectomy   Pulmonary nodules    Renal cell cancer (HCC)    metastatic renal cell carcinoma (biopsy of left renal mass 10/2012) On nivolumab  [facility]   Renal mass 10/01/2012   10/23/12 Bx Pos high grad clear cell renal cell ca> referred to oncology 10/26/2012     S/p nephrectomy    left, due to metastatic RCC, 3/14   Past Surgical History:  Procedure Laterality  Date   BREAST BIOPSY  08/18/2011   Procedure: BREAST BIOPSY WITH NEEDLE LOCALIZATION;  Surgeon: Deward GORMAN Curvin DOUGLAS, MD;  Location: [facility] OR;  Service: General;  Laterality: Left;  left breast biopsy with needle localization   BRONCHIAL BRUSH BIOPSY  2014   CARDIAC CATHETERIZATION     CORONARY ARTERY BYPASS GRAFT N/A 01/02/2015   Procedure: CORONARY ARTERY BYPASS GRAFTING (CABG)TIMES TWO USING RIGHT GREATER SAPHENOUS LEG VEIN HARVESTED ENDOSCOPICALLY;  Surgeon: Dallas KATHEE Jude, MD;  Location: [facility] OR;  Service: Open Heart Surgery;  Laterality: N/A;   LAPAROSCOPIC NEPHRECTOMY Left 11/22/2012   Procedure: LAPAROSCOPIC NEPHRECTOMY;  Surgeon: Noretta Ferrara, MD;  Location: [facility] ORS;  Service: Urology;  Laterality: Left;   LEFT HEART CATH AND CORS/GRAFTS ANGIOGRAPHY N/A 10/11/2016   Procedure: Left Heart Cath and Cors/Grafts Angiography;  Surgeon: Ozell Fell, MD;  Location: [facility] INVASIVE CV LAB;  Service: Cardiovascular;  Laterality: N/A;   LEFT HEART CATHETERIZATION WITH CORONARY ANGIOGRAM N/A 01/02/2015   Procedure: LEFT HEART CATHETERIZATION WITH CORONARY ANGIOGRAM;  Surgeon: Alm LELON Clay, MD;  Location: [facility] CATH LAB;  Service: Cardiovascular;  Laterality: N/A;   NECK LESION BIOPSY Left 08/2016   PORTA CATH INSERTION Right ~ 2017   TONSILLECTOMY  1967   VIDEO BRONCHOSCOPY  10/11/2012    Procedure: VIDEO BRONCHOSCOPY WITH FLUORO;  Surgeon: Francis CHRISTELLA Dresser, MD;  Location: [facility] ENDOSCOPY;  Service: Cardiopulmonary;  Laterality: Bilateral;   WIDS     WISDOM TOOTH EXTRACTION     all 4   Family History  Problem Relation Age of Onset   Heart disease Mother    Heart disease Father    Heart attack Father    Cancer Maternal Grandfather    Heart disease Brother    Stroke Brother    Aortic dissection Brother    Colon cancer Maternal Aunt    Skin cancer Maternal Uncle    Stomach cancer Maternal Uncle    Heart disease Maternal Grandmother    Breast cancer Maternal Aunt    Social History   Socioeconomic History   Marital status: Married    Spouse name: Not on file   Number of children: 0   Years of education: Not on file   Highest education level: Not on file  Occupational History   Occupation: UNEMPLOYEED    Comment: IT consultant; 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   Tobacco Use   Smoking status: Former    Current packs/day: 0.00    Average packs/day: 1.5 packs/day for 40.0 years (60.0 ttl pk-yrs)    Types: Cigarettes    Start date: 09/24/1972    Quit date: 09/24/2012    Years since quitting: 11.5   Smokeless tobacco: Never  Vaping Use   Vaping status: Never Used  Substance and Sexual Activity   Alcohol use: Yes    Comment: 05/26/2017 couple drinks/year   Drug use: No   Sexual activity: Yes  Other Topics Concern   Not on file  Social History Narrative   Not on file   Social Drivers of Health   Financial Resource Strain: Low Risk  (03/28/2024)   Overall Financial Resource Strain (CARDIA)    Difficulty of Paying Living Expenses: Not hard at all  Food Insecurity: No Food Insecurity (03/28/2024)   Hunger Vital Sign    Worried About Running Out of Food in the Last Year: Never true    Ran Out of Food in the Last Year: Never true  Transportation Needs: No Transportation Needs (03/28/2024)   PRAPARE - Transportation  Lack of Transportation (Medical): No    Lack of Transportation  (Non-Medical): No  Physical Activity: Inactive (03/28/2024)   Exercise Vital Sign    Days of Exercise per Week: 0 days    Minutes of Exercise per Session: 0 min  Stress: Stress Concern Present (03/28/2024)   [employer] of Occupational Health - Occupational Stress Questionnaire    Feeling of Stress: To some extent  Social Connections: Moderately Integrated (03/28/2024)   Social Connection and Isolation Panel    Frequency of Communication with Friends and Family: More than three times a week    Frequency of Social Gatherings with Friends and Family: More than three times a week    Attends Religious Services: Never    Database administrator or Organizations: Yes    Attends Engineer, structural: More than 4 times per year    Marital Status: Married    Tobacco Counseling Counseling given: Not Answered   Clinical Intake:  Pre-visit preparation completed: Yes  Pain : No/denies pain     Diabetes: Yes CBG done?: No Did pt. bring in CBG monitor from home?: No  How often do you need to have someone help you when you read instructions, pamphlets, or other written materials from your doctor or pharmacy?: 1 - Never  Interpreter Needed?: No  Information entered by :: Mliss Graff LPN   Activities of Daily Living    03/28/2024   11:28 AM  In your present state of health, do you have any difficulty performing the following activities:  Hearing? 1  Vision? 0  Difficulty concentrating or making decisions? 1  Walking or climbing stairs? 0  Dressing or bathing? 0  Doing errands, shopping? 0  Preparing Food and eating ? N  Using the Toilet? N  In the past six months, have you accidently leaked urine? Y  Do you have problems with loss of bowel control? N  Managing your Medications? N  Managing your Finances? N  Housekeeping or managing your Housekeeping? N    Patient Care Team: Duanne Butler DASEN, MD as PCP - General (Family Medicine) Wonda Sharper, MD as PCP -  Cardiology (Cardiology) Curvin Deward MOULD, MD as Attending Physician (General Surgery) Clance, Francis HERO, MD as Attending Physician (Pulmonary Disease) Army Dallas NOVAK, MD (Inactive) as Consulting Physician (Cardiothoracic Surgery) Debby Lonni Spanner, MD as Consulting Physician (Hematology and Oncology) Candida Morene ORN, MD as Referring Physician (Hematology and Oncology)  Indicate any recent Medical Services you may have received from other than [facility] providers in the past year (date may be approximate).     Assessment:   This is a routine wellness examination for [place].  Hearing/Vision screen Hearing Screening - Comments:: Has trouble with wax Vision Screening - Comments:: Up to date [facility]   Goals Addressed             This Visit's Progress    Exercise 3x per week (30 min per time)   On track    Patient Stated       Travel more       Depression Screen    03/28/2024   11:34 AM 09/08/2023    3:38 PM 03/23/2023    1:42 PM 05/11/2022   12:11 PM 03/04/2022   11:16 AM 03/07/2018    3:10 PM  PHQ 2/9 Scores  PHQ - 2 Score 2 0 0 1 0 1  PHQ- 9 Score 3         Fall Risk  03/28/2024   11:24 AM 09/08/2023    3:38 PM 03/23/2023    1:43 PM 03/22/2023    1:42 PM 05/11/2022   12:12 PM  Fall Risk   Falls in the past year? 0 1 0 0 0  Number falls in past yr: 0 0 0 0 0  Injury with Fall? 0 1 0 0 0  Risk for fall due to :   No Fall Risks  No Fall Risks  Follow up Falls evaluation completed;Education provided;Falls prevention discussed  Falls prevention discussed;Education provided;Falls evaluation completed  Falls prevention discussed      Data saved with a previous flowsheet row definition    MEDICARE RISK AT HOME: Medicare Risk at Home Any stairs in or around the home?: Yes If so, are there any without handrails?: No Home free of loose throw rugs in walkways, pet beds, electrical cords, etc?: Yes Adequate lighting in your home to reduce risk of falls?: Yes Life  alert?: No Use of a cane, walker or w/c?: Yes Grab bars in the bathroom?: Yes Shower chair or bench in shower?: Yes Elevated toilet seat or a handicapped toilet?: Yes  TIMED UP AND GO:  Was the test performed?  No    Cognitive Function:        03/28/2024   11:30 AM 03/23/2023    1:44 PM 03/04/2022   11:18 AM  6CIT Screen  What Year? 0 points 0 points 0 points  What month? 0 points 0 points 0 points  What time? 0 points 0 points 0 points  Count back from 20 0 points 0 points 0 points  Months in reverse 0 points 2 points 0 points  Repeat phrase 0 points 0 points 0 points  Total Score 0 points 2 points 0 points    Immunizations Immunization History  Administered Date(s) Administered   PFIZER Comirnaty(Gray Top)Covid-19 Tri-Sucrose Vaccine 12/22/2020, 01/26/2021    TDAP status: Due, Education has been provided regarding the importance of this vaccine. Advised may receive this vaccine at local pharmacy or Health Dept. Aware to provide a copy of the vaccination record if obtained from local pharmacy or Health Dept. Verbalized acceptance and understanding.  Flu Vaccine status: Due, Education has been provided regarding the importance of this vaccine. Advised may receive this vaccine at local pharmacy or Health Dept. Aware to provide a copy of the vaccination record if obtained from local pharmacy or Health Dept. Verbalized acceptance and understanding.  Pneumococcal vaccine status: Due, Education has been provided regarding the importance of this vaccine. Advised may receive this vaccine at local pharmacy or Health Dept. Aware to provide a copy of the vaccination record if obtained from local pharmacy or Health Dept. Verbalized acceptance and understanding.  Covid-19 vaccine status: Declined, Education has been provided regarding the importance of this vaccine but patient still declined. Advised may receive this vaccine at local pharmacy or Health Dept.or vaccine clinic. Aware to  provide a copy of the vaccination record if obtained from local pharmacy or Health Dept. Verbalized acceptance and understanding.  Qualifies for Shingles Vaccine? Yes   Zostavax completed No   Shingrix Completed?: No.    Education has been provided regarding the importance of this vaccine. Patient has been advised to call insurance company to determine out of pocket expense if they have not yet received this vaccine. Advised may also receive vaccine at local pharmacy or Health Dept. Verbalized acceptance and understanding.  Screening Tests Health Maintenance  Topic Date Due   FOOT EXAM  Never done   Diabetic kidney evaluation - Urine ACR  Never done   Pneumococcal Vaccine: 50+ Years (1 of 2 - PCV) Never done   HEMOGLOBIN A1C  07/05/2015   Lung Cancer Screening  10/08/2017   COVID-19 Vaccine (3 - Pfizer risk series) 02/23/2021   Diabetic kidney evaluation - eGFR measurement  05/22/2021   OPHTHALMOLOGY EXAM  11/16/2021   Hepatitis C Screening  06/07/2024 (Originally 03/14/1974)   Zoster Vaccines- Shingrix (1 of 2) 06/28/2024 (Originally 03/14/1975)   INFLUENZA VACCINE  04/05/2024   Fecal DNA (Cologuard)  03/18/2025   Medicare Annual Wellness (AWV)  03/28/2025   MAMMOGRAM  04/05/2025   DEXA SCAN  Completed   Hepatitis B Vaccines  Aged Out   HPV VACCINES  Aged Out   Meningococcal B Vaccine  Aged Out   DTaP/Tdap/Td  Discontinued   Colonoscopy  Discontinued    Health Maintenance  Health Maintenance Due  Topic Date Due   FOOT EXAM  Never done   Diabetic kidney evaluation - Urine ACR  Never done   Pneumococcal Vaccine: 50+ Years (1 of 2 - PCV) Never done   HEMOGLOBIN A1C  07/05/2015   Lung Cancer Screening  10/08/2017   COVID-19 Vaccine (3 - Pfizer risk series) 02/23/2021   Diabetic kidney evaluation - eGFR measurement  05/22/2021   OPHTHALMOLOGY EXAM  11/16/2021    Colorectal cancer screening: Type of screening: Cologuard. Completed 2023. Repeat every 3 years  Mammogram status:  Completed 2024. Repeat every year  Bone Density status: Completed 2024. Results reflect: Bone density results: NORMAL. Repeat every 5 years.  Lung Cancer Screening: (Low Dose CT Chest recommended if Age 54-80 years, 20 pack-year currently smoking OR have quit w/in 15years.) does qualify.   Lung Cancer Screening Referral: declined  Additional Screening:  Hepatitis C Screening  never done  Vision Screening: Recommended annual ophthalmology exams for early detection of glaucoma and other disorders of the eye. Is the patient up to date with their annual eye exam?  Yes  Who is the provider or what is the name of the office in which the patient attends annual eye exams? [facility] If pt is not established with a provider, would they like to be referred to a provider to establish care? No .   Dental Screening: Recommended annual dental exams for proper oral hygiene  Nutrition Risk Assessment:  Has the patient had any N/V/D within the last 2 months?  No  Does the patient have any non-healing wounds?  No  Has the patient had any unintentional weight loss or weight gain?  No   Diabetes:  Is the patient diabetic?  Yes  If diabetic, was a CBG obtained today?  No  Did the patient bring in their glucometer from home?  No  How often do you monitor your CBG's? 1 x ad ay.   Financial Strains and Diabetes Management:  Are you having any financial strains with the device, your supplies or your medication? No .  Does the patient want to be seen by Chronic Care Management for management of their diabetes?  No  Would the patient like to be referred to a Nutritionist or for Diabetic Management?  No   Diabetic Exams:  Diabetic Eye Exam: Completed  Pt has been advised about the importance in completing this exam  Diabetic Foot Exam: . Pt has been advised about the importance in completing this exam..    Community Resource Referral / Chronic Care Management: CRR required this visit?  No    CCM required this visit?  No     Plan:     I have personally reviewed and noted the following in the patient's chart:   Medical and social history Use of alcohol, tobacco or illicit drugs  Current medications and supplements including opioid prescriptions. Patient is not currently taking opioid prescriptions. Functional ability and status Nutritional status Physical activity Advanced directives List of other physicians Hospitalizations, surgeries, and ER visits in previous 12 months Vitals Screenings to include cognitive, depression, and falls Referrals and appointments  In addition, I have reviewed and discussed with patient certain preventive protocols, quality metrics, and best practice recommendations. A written personalized care plan for preventive services as well as general preventive health recommendations were provided to patient.     Mliss Graff, LPN   [id number]   After Visit Summary: (MyChart) Due to this being a telephonic visit, the after visit summary with patients personalized plan was offered to patient via MyChart   Nurse Notes:

## 2024-03-28 NOTE — Patient Instructions (Signed)
 Sheri Hernandez , Thank you for taking time to come for your Medicare Wellness Visit. I appreciate your ongoing commitment to your health goals. Please review the following plan we discussed and let me know if I can assist you in the future.   Screening recommendations/referrals: Colonoscopy: up to date Mammogram: up to date Bone Density: up to date Recommended yearly ophthalmology/optometry visit for glaucoma screening and checkup Recommended yearly dental visit for hygiene and checkup  Vaccinations: Influenza vaccine:  Pneumococcal vaccine:  Tdap vaccine:  Shingles vaccine:        Preventive Care 65 Years and Older, Female Preventive care refers to lifestyle choices and visits with your health care provider that can promote health and wellness. What does preventive care include? A yearly physical exam. This is also called an annual well check. Dental exams once or twice a year. Routine eye exams. Ask your health care provider how often you should have your eyes checked. Personal lifestyle choices, including: Daily care of your teeth and gums. Regular physical activity. Eating a healthy diet. Avoiding tobacco and drug use. Limiting alcohol use. Practicing safe sex. Taking low-dose aspirin  every day. Taking vitamin and mineral supplements as recommended by your health care provider. What happens during an annual well check? The services and screenings done by your health care provider during your annual well check will depend on your age, overall health, lifestyle risk factors, and family history of disease. Counseling  Your health care provider may ask you questions about your: Alcohol use. Tobacco use. Drug use. Emotional well-being. Home and relationship well-being. Sexual activity. Eating habits. History of falls. Memory and ability to understand (cognition). Work and work Astronomer. Reproductive health. Screening  You may have the following tests or  measurements: Height, weight, and BMI. Blood pressure. Lipid and cholesterol levels. These may be checked every 5 years, or more frequently if you are over 20 years old. Skin check. Lung cancer screening. You may have this screening every year starting at age 29 if you have a 30-pack-year history of smoking and currently smoke or have quit within the past 15 years. Fecal occult blood test (FOBT) of the stool. You may have this test every year starting at age 39. Flexible sigmoidoscopy or colonoscopy. You may have a sigmoidoscopy every 5 years or a colonoscopy every 10 years starting at age 66. Hepatitis C blood test. Hepatitis B blood test. Sexually transmitted disease (STD) testing. Diabetes screening. This is done by checking your blood sugar (glucose) after you have not eaten for a while (fasting). You may have this done every 1-3 years. Bone density scan. This is done to screen for osteoporosis. You may have this done starting at age 66. Mammogram. This may be done every 1-2 years. Talk to your health care provider about how often you should have regular mammograms. Talk with your health care provider about your test results, treatment options, and if necessary, the need for more tests. Vaccines  Your health care provider may recommend certain vaccines, such as: Influenza vaccine. This is recommended every year. Tetanus, diphtheria, and acellular pertussis (Tdap, Td) vaccine. You may need a Td booster every 10 years. Zoster vaccine. You may need this after age 76. Pneumococcal 13-valent conjugate (PCV13) vaccine. One dose is recommended after age 68. Pneumococcal polysaccharide (PPSV23) vaccine. One dose is recommended after age 51. Talk to your health care provider about which screenings and vaccines you need and how often you need them. This information is not intended to replace advice  given to you by your health care provider. Make sure you discuss any questions you have with your  health care provider. Document Released: 09/18/2015 Document Revised: 05/11/2016 Document Reviewed: 06/23/2015 Elsevier Interactive Patient Education  2017 [employer].  Fall Prevention in the Home Falls can cause injuries. They can happen to people of all ages. There are many things you can do to make your home safe and to help prevent falls. What can I do on the outside of my home? Regularly fix the edges of walkways and driveways and fix any cracks. Remove anything that might make you trip as you walk through a door, such as a raised step or threshold. Trim any bushes or trees on the path to your home. Use bright outdoor lighting. Clear any walking paths of anything that might make someone trip, such as rocks or tools. Regularly check to see if handrails are loose or broken. Make sure that both sides of any steps have handrails. Any raised decks and porches should have guardrails on the edges. Have any leaves, snow, or ice cleared regularly. Use sand or salt on walking paths during winter. Clean up any spills in your garage right away. This includes oil or grease spills. What can I do in the bathroom? Use night lights. Install grab bars by the toilet and in the tub and shower. Do not use towel bars as grab bars. Use non-skid mats or decals in the tub or shower. If you need to sit down in the shower, use a plastic, non-slip stool. Keep the floor dry. Clean up any water that spills on the floor as soon as it happens. Remove soap buildup in the tub or shower regularly. Attach bath mats securely with double-sided non-slip rug tape. Do not have throw rugs and other things on the floor that can make you trip. What can I do in the bedroom? Use night lights. Make sure that you have a light by your bed that is easy to reach. Do not use any sheets or blankets that are too big for your bed. They should not hang down onto the floor. Have a firm chair that has side arms. You can use this for  support while you get dressed. Do not have throw rugs and other things on the floor that can make you trip. What can I do in the kitchen? Clean up any spills right away. Avoid walking on wet floors. Keep items that you use a lot in easy-to-reach places. If you need to reach something above you, use a strong step stool that has a grab bar. Keep electrical cords out of the way. Do not use floor polish or wax that makes floors slippery. If you must use wax, use non-skid floor wax. Do not have throw rugs and other things on the floor that can make you trip. What can I do with my stairs? Do not leave any items on the stairs. Make sure that there are handrails on both sides of the stairs and use them. Fix handrails that are broken or loose. Make sure that handrails are as long as the stairways. Check any carpeting to make sure that it is firmly attached to the stairs. Fix any carpet that is loose or worn. Avoid having throw rugs at the top or bottom of the stairs. If you do have throw rugs, attach them to the floor with carpet tape. Make sure that you have a light switch at the top of the stairs and the bottom of  the stairs. If you do not have them, ask someone to add them for you. What else can I do to help prevent falls? Wear shoes that: Do not have high heels. Have rubber bottoms. Are comfortable and fit you well. Are closed at the toe. Do not wear sandals. If you use a stepladder: Make sure that it is fully opened. Do not climb a closed stepladder. Make sure that both sides of the stepladder are locked into place. Ask someone to hold it for you, if possible. Clearly mark and make sure that you can see: Any grab bars or handrails. First and last steps. Where the edge of each step is. Use tools that help you move around (mobility aids) if they are needed. These include: Canes. Walkers. Scooters. Crutches. Turn on the lights when you go into a dark area. Replace any light bulbs as soon  as they burn out. Set up your furniture so you have a clear path. Avoid moving your furniture around. If any of your floors are uneven, fix them. If there are any pets around you, be aware of where they are. Review your medicines with your doctor. Some medicines can make you feel dizzy. This can increase your chance of falling. Ask your doctor what other things that you can do to help prevent falls. This information is not intended to replace advice given to you by your health care provider. Make sure you discuss any questions you have with your health care provider. Document Released: 06/18/2009 Document Revised: 01/28/2016 Document Reviewed: 09/26/2014 Elsevier Interactive Patient Education  2017 [employer].

## 2024-04-22 ENCOUNTER — Encounter: Payer: Self-pay | Admitting: Cardiovascular Disease

## 2024-04-22 ENCOUNTER — Ambulatory Visit: Attending: Cardiovascular Disease | Admitting: Cardiovascular Disease

## 2024-04-22 VITALS — BP 142/80 | HR 76 | Ht 68.0 in | Wt 223.0 lb

## 2024-04-22 DIAGNOSIS — I10 Essential (primary) hypertension: Secondary | ICD-10-CM

## 2024-04-22 DIAGNOSIS — I25118 Atherosclerotic heart disease of native coronary artery with other forms of angina pectoris: Secondary | ICD-10-CM

## 2024-04-22 DIAGNOSIS — E782 Mixed hyperlipidemia: Secondary | ICD-10-CM

## 2024-04-22 DIAGNOSIS — R002 Palpitations: Secondary | ICD-10-CM

## 2024-04-22 DIAGNOSIS — R0602 Shortness of breath: Secondary | ICD-10-CM

## 2024-04-22 MED ORDER — FUROSEMIDE 20 MG PO TABS: 20.0000 mg | ORAL_TABLET | Freq: Every day | ORAL | 3 refills | Status: DC

## 2024-04-22 NOTE — Assessment & Plan Note (Signed)
 Monitor reviewed with benign findings. No AFib, flutter, or significant arrhythmia.

## 2024-04-22 NOTE — Assessment & Plan Note (Addendum)
 Recent office evaluation notes reviewed. Felt to have symptoms secondary to GERD/GI etiology.  Clinically improved after starting PPI.  Continue current antianginal therapy.  Overall appears stable.

## 2024-04-22 NOTE — Assessment & Plan Note (Signed)
 Treated with atorvastatin  40 milligrams daily.  Last labs demonstrated a cholesterol 122 and LDL 66.

## 2024-04-22 NOTE — Assessment & Plan Note (Signed)
 Blood pressure controlled.  Add low-dose furosemide .  Continue spironolactone , isosorbide , metoprolol , and losartan .

## 2024-04-22 NOTE — Patient Instructions (Signed)
 Medication Instructions:  START Lasix  20 mg once daily   *If you need a refill on your cardiac medications before your next appointment, please call your pharmacy*  Lab Work: To be completed when you come back for your echocardiogram: BMP  If you have labs (blood work) drawn today and your tests are completely normal, you will receive your results only by: MyChart Message (if you have MyChart) OR A paper copy in the mail If you have any lab test that is abnormal or we need to change your treatment, we will call you to review the results.  Testing/Procedures: Your physician has requested that you have an echocardiogram at next available appointment slot. Echocardiography is a painless test that uses sound waves to create images of your heart. It provides your doctor with information about the size and shape of your heart and how well your heart's chambers and valves are working. This procedure takes approximately one hour. There are no restrictions for this procedure. Please do NOT wear cologne, perfume, aftershave, or lotions (deodorant is allowed). Please arrive 15 minutes prior to your appointment time.  Please note: We ask at that you not bring children with you during ultrasound (echo/ vascular) testing. Due to room size and safety concerns, children are not allowed in the ultrasound rooms during exams. Our front office staff cannot provide observation of children in our lobby area while testing is being conducted. An adult accompanying a patient to their appointment will only be allowed in the ultrasound room at the discretion of the ultrasound technician under special circumstances. We apologize for any inconvenience.   Follow-Up: At [facility], you and your health needs are our priority.  As part of our continuing mission to provide you with exceptional heart care, our providers are all part of one team.  This team includes your primary Cardiologist (physician) and Advanced  Practice Providers or APPs (Physician Assistants and Nurse Practitioners) who all work together to provide you with the care you need, when you need it.  Your next appointment:   6 month(s)  Provider:   Ozell Fell, MD

## 2024-04-22 NOTE — Progress Notes (Signed)
 Cardiology Office Note:    Date:  04/22/2024   ID:  Sheri Hernandez, DOB [birth year], MRN [id number]  PCP:  Duanne Butler DASEN, MD   [facility] Providers Cardiologist:  Ozell Fell, MD     Referring MD: Duanne Butler DASEN, MD   Chief Complaint  Patient presents with   Shortness of Breath    History of Present Illness:    Sheri Hernandez is a 68 y.o. female with a hx of CAD, presenting for follow-up evaluation. The patient has a complicated history, initially presenting with non-STEMI in 2016 and undergoing complex PCI of severe stenosis in the RCA complicated by extensive dissection requiring emergency CABG with the saphenous vein graft to PDA and PLA.  The patient has had recurrent angina and follow-up catheterization demonstrated occlusion of her bypass graft.  The RCA remained patent with tight residual stenosis and medical therapy was recommended in the setting of controlled symptoms.  Comorbid conditions include history of metastatic renal cell carcinoma status post left nephrectomy, breast cancer status post left lumpectomy, hypertension, and mixed hyperlipidemia.  The patient was recently evaluated for chest pain that ultimately was felt to be related to GI symptoms.  She was started on a PPI with significant improvement in symptoms.  She is doing well at present and has not required frequent nitroglycerin  use.  She is not having exertional angina at present.  She does have occasional episodes where she lies back to sleep and feels short of breath.  She complains of some worsening swelling in the right leg greater than left.  No PND.  She reports mild exertional dyspnea.  She continues to avoid salt.  She has been traveling a lot and she enjoys taking cruises.  She has been told by her oncologist that her renal cell cancer is cured.   Current Medications: Current Meds  Medication Sig   furosemide  (LASIX ) 20 MG tablet Take 1 tablet (20 mg total) by mouth daily.      Allergies:   Silver and Tape   ROS:   Please see the history of present illness.    All other systems reviewed and are negative.  EKGs/Labs/Other Studies Reviewed:    The following studies were reviewed today: Cardiac Studies & Procedures   ______________________________________________________________________________________________ CARDIAC CATHETERIZATION  CARDIAC CATHETERIZATION 10/11/2016  Conclusion 1. Severe 2 vessel CAD with tight stenosis of a small left circumflex and total occlusion of the proximal RCA (large dominant vessel) 2. Nonobstructive left main and LAD stenosis 3. Normal LV function by noninvasive assessment 4. Heavily calcified coronary arteries  Plan:  Initial medical therapy (negative enzymes this admission)  If fails medical therapy, consider PCI of the proximal LCx and CTO-PCI with atherectomy of the RCA  OK to discharge home tomorrow if no complications arise  Findings Coronary Findings Diagnostic  Dominance: Right  Left Anterior Descending The lesion is calcified.  Left Circumflex The lesion is concentric. unchanged from previous cath study in 2016  Right Coronary Artery with left-to-right collateral flow. The lesion is severely calcified. There is heavy calcification and short total occlusion of the RCA in the proximal vessel. The LAD collateralizes the RCA branches via septal collaterals.  Saphenous Graft To RPDA SVG graft was visualized by angiography. Total occlusion just beyond the aortic anastomosis.  Intervention  No interventions have been documented.   STRESS TESTS  NM MYOCAR MULTI W/SPECT W 10/10/2016  Narrative CLINICAL DATA:  Chest pain  EXAM: MYOCARDIAL IMAGING WITH SPECT (REST AND EXERCISE)  GATED LEFT VENTRICULAR WALL MOTION STUDY  LEFT VENTRICULAR EJECTION FRACTION  TECHNIQUE: Standard myocardial SPECT imaging was performed after resting intravenous injection of 10 mCi Tc-68m tetrofosmin .  Subsequently, exercise tolerance test was performed by the patient under the supervision of the Cardiology staff. At peak-stress, 30 mCi Tc-19m tetrofosmin  was injected intravenously and standard myocardial SPECT imaging was performed. Quantitative gated imaging was also performed to evaluate left ventricular wall motion, and estimate left ventricular ejection fraction.  COMPARISON:  None.  FINDINGS: Perfusion: There is decreased activity noted on rest and stress images in the inferolateral wall, worsening somewhat on stress images. This is concerning for inferolateral infarct with mild peri-infarct ischemia.  Wall Motion: Decreased motion inferiorly.  Left Ventricular Ejection Fraction: 53 %  End diastolic volume 66 ml  End systolic volume 31 ml  IMPRESSION: 1. Inferolateral defect on rest and stress images, worsening on stress images concerning for area of infarct with peri-infarct ischemia.  2. Decreased inferior wall motion.  3. Left ventricular ejection fraction 53%  4. Non invasive risk stratification*: Intermediate  *2012 Appropriate Use Criteria for Coronary Revascularization Focused Update: J Am Coll Cardiol. 2012;59(9):857-881. http://content.dementiazones.com.aspx?articleid=1201161   Electronically Signed By: Franky Crease M.D. On: 10/10/2016 16:19   ECHOCARDIOGRAM  ECHOCARDIOGRAM COMPLETE 05/04/2022  Narrative ECHOCARDIOGRAM REPORT    Patient Name:   Sheri Hernandez Date of Exam: 05/04/2022 Medical Rec #:  [id number]        Height:       68.0 in Accession #:    [id number]       Weight:       219.0 lb Date of Birth:  Dec 19, 1955        BSA:          2.124 m Patient Age:    66 years         BP:           134/88 mmHg Patient Gender: F                HR:           64 bpm. Exam Location:  [address]  Procedure: 2D Echo, Cardiac Doppler and Color Doppler  Indications:    I25.119 Coronary artery disease;  History:        Patient has prior  history of Echocardiogram examinations, most recent 06/03/2020. CHF, Previous Myocardial Infarction, Prior CABG, Signs/Symptoms:Murmur and Chest Pain; Risk Factors:Hypertension, Diabetes and Dyslipidemia. Near syncope. Lower extremity edema. S/P nephrectomy.  Sonographer:    Jon Hacker RCS Referring Phys: OZELL FELL  IMPRESSIONS   1. Left ventricular ejection fraction, by estimation, is 55 to 60%. The left ventricle has normal function. The left ventricle demonstrates regional wall motion abnormalities with basal inferior hypokinesis. There is mild left ventricular hypertrophy. Left ventricular diastolic parameters are consistent with Grade I diastolic dysfunction (impaired relaxation). 2. Right ventricular systolic function is normal. The right ventricular size is normal. There is normal pulmonary artery systolic pressure. The estimated right ventricular systolic pressure is 23.6 mmHg. 3. Left atrial size was mildly dilated. 4. The mitral valve is normal in structure. Mild mitral valve regurgitation. No evidence of mitral stenosis. 5. The aortic valve is tricuspid. There is moderate calcification of the aortic valve. Aortic valve regurgitation is trivial. No aortic stenosis is present. 6. Aortic dilatation noted. There is mild dilatation of the ascending aorta, measuring 38 mm. 7. The inferior vena cava is normal in size with greater than 50% respiratory variability, suggesting right atrial pressure  of 3 mmHg.  FINDINGS Left Ventricle: Left ventricular ejection fraction, by estimation, is 55 to 60%. The left ventricle has normal function. The left ventricle demonstrates regional wall motion abnormalities. The left ventricular internal cavity size was normal in size. There is mild left ventricular hypertrophy. Left ventricular diastolic parameters are consistent with Grade I diastolic dysfunction (impaired relaxation).  Right Ventricle: The right ventricular size is normal. No  increase in right ventricular wall thickness. Right ventricular systolic function is normal. There is normal pulmonary artery systolic pressure. The tricuspid regurgitant velocity is 2.27 m/s, and with an assumed right atrial pressure of 3 mmHg, the estimated right ventricular systolic pressure is 23.6 mmHg.  Left Atrium: Left atrial size was mildly dilated.  Right Atrium: Right atrial size was normal in size.  Pericardium: There is no evidence of pericardial effusion.  Mitral Valve: The mitral valve is normal in structure. There is mild calcification of the mitral valve leaflet(s). Mild mitral annular calcification. Mild mitral valve regurgitation. No evidence of mitral valve stenosis.  Tricuspid Valve: The tricuspid valve is normal in structure. Tricuspid valve regurgitation is trivial.  Aortic Valve: The aortic valve is tricuspid. There is moderate calcification of the aortic valve. Aortic valve regurgitation is trivial. No aortic stenosis is present.  Pulmonic Valve: The pulmonic valve was normal in structure. Pulmonic valve regurgitation is not visualized.  Aorta: Aortic dilatation noted. There is mild dilatation of the ascending aorta, measuring 38 mm.  Venous: The inferior vena cava is normal in size with greater than 50% respiratory variability, suggesting right atrial pressure of 3 mmHg.  IAS/Shunts: No atrial level shunt detected by color flow Doppler.   LEFT VENTRICLE PLAX 2D LVIDd:         4.20 cm Diastology LVIDs:         2.90 cm LV e' medial:    5.70 cm/s LV PW:         1.00 cm LV E/e' medial:  13.1 LV IVS:        1.20 cm LV e' lateral:   8.86 cm/s LV E/e' lateral: 8.5   RIGHT VENTRICLE RV Basal diam:  2.70 cm RV S prime:     8.11 cm/s TAPSE (M-mode): 1.2 cm RVSP:           23.6 mmHg  LEFT ATRIUM             Index        RIGHT ATRIUM           Index LA diam:        4.10 cm 1.93 cm/m   RA Pressure: 3.00 mmHg LA Vol (A2C):   67.2 ml 31.64 ml/m  RA Area:      10.70 cm LA Vol (A4C):   40.9 ml 19.26 ml/m  RA Volume:   20.20 ml  9.51 ml/m LA Biplane Vol: 56.8 ml 26.74 ml/m AORTIC VALVE LVOT Vmax:   82.20 cm/s LVOT Vmean:  56.500 cm/s LVOT VTI:    0.196 m  AORTA Ao Asc diam: 3.80 cm  MITRAL VALVE               TRICUSPID VALVE MV Area (PHT): 2.76 cm    TR Peak grad:   20.6 mmHg MV Decel Time: 275 msec    TR Vmax:        227.00 cm/s MV E velocity: 74.90 cm/s  Estimated RAP:  3.00 mmHg MV A velocity: 85.90 cm/s  RVSP:  23.6 mmHg MV E/A ratio:  0.87 SHUNTS Systemic VTI: 0.20 m  Dalton McleanMD Electronically signed by Ezra Kanner Signature Date/Time: 05/04/2022/5:48:13 PM    Final    MONITORS  LONG TERM MONITOR (3-14 DAYS) 02/01/2024  Narrative Zio patch monitor 13 days 01/05/2024 - 01/18/2024: Dominant rhythm: Sinus. HR 54-127 bpm. Avg HR 83 bpm, in sinus rhythm. 1 episode of atrial tachycardia, fastest at 125 bpm for 4 beats. <1% isolated SVE, couplet/triplets. 1 episode of VT, fastest at 182 bpm for 10 beats. <1% isolated VE, couplets. No atrial fibrillation/atrial flutter/high grade AV block, sinus pause >3sec noted. 12 patient triggered events correlated with sinus rhythm w/or w/out VE.       ______________________________________________________________________________________________      EKG:        Recent Labs: No results found for requested labs within last 365 days.  Recent Lipid Panel    Component Value Date/Time   CHOL 90 01/03/2015 0210   TRIG 77 01/03/2015 0210   HDL 15 (L) 01/03/2015 0210   CHOLHDL 6.0 01/03/2015 0210   VLDL 15 01/03/2015 0210   LDLCALC 60 01/03/2015 0210         Physical Exam:    VS:  BP (!) 142/80   Pulse 76   Ht 5' 8 (1.727 m)   Wt 223 lb (101.2 kg)   SpO2 95%   BMI 33.91 kg/m     Wt Readings from Last 3 Encounters:  04/22/24 223 lb (101.2 kg)  03/28/24 217 lb (98.4 kg)  02/14/24 220 lb (99.8 kg)     GEN:  Well nourished, well developed in no  acute distress HEENT: Normal NECK: No JVD; No carotid bruits LYMPHATICS: No lymphadenopathy CARDIAC: RRR, no murmurs, rubs, gallops RESPIRATORY:  Clear to auscultation without rales, wheezing or rhonchi  ABDOMEN: Soft, non-tender, non-distended MUSCULOSKELETAL: 2+ right lower extremity edema and 1+ left lower extremity edema; No deformity  SKIN: Warm and dry NEUROLOGIC:  Alert and oriented x 3 PSYCHIATRIC:  Normal affect   Assessment & Plan Coronary artery disease of native artery of native heart with stable angina pectoris [facility]) Recent office evaluation notes reviewed. Felt to have symptoms secondary to GERD/GI etiology.  Clinically improved after starting PPI.  Continue current antianginal therapy.  Overall appears stable. Palpitations Monitor reviewed with benign findings. No AFib, flutter, or significant arrhythmia.  Mixed hyperlipidemia Treated with atorvastatin  40 milligrams daily.  Last labs demonstrated a cholesterol 122 and LDL 66. Essential hypertension Blood pressure controlled.  Add low-dose furosemide .  Continue spironolactone , isosorbide , metoprolol , and losartan . SOB (shortness of breath) Patient with edema, shortness of breath, and orthopnea.  Her last echo from 2023 showed normal LVEF with only grade 1 diastolic dysfunction and normal RV function.  Recommend updated echocardiogram.  Add furosemide  20 mg daily.            Medication Adjustments/Labs and Tests Ordered: Current medicines are reviewed at length with the patient today.  Concerns regarding medicines are outlined above.  Orders Placed This Encounter  Procedures   Basic metabolic panel with GFR   ECHOCARDIOGRAM COMPLETE   Meds ordered this encounter  Medications   furosemide  (LASIX ) 20 MG tablet    Sig: Take 1 tablet (20 mg total) by mouth daily.    Dispense:  30 tablet    Refill:  3    Patient Instructions  Medication Instructions:  START Lasix  20 mg once daily   *If you need a refill on  your cardiac medications before your  next appointment, please call your pharmacy*  Lab Work: To be completed when you come back for your echocardiogram: BMP  If you have labs (blood work) drawn today and your tests are completely normal, you will receive your results only by: MyChart Message (if you have MyChart) OR A paper copy in the mail If you have any lab test that is abnormal or we need to change your treatment, we will call you to review the results.  Testing/Procedures: Your physician has requested that you have an echocardiogram at next available appointment slot. Echocardiography is a painless test that uses sound waves to create images of your heart. It provides your doctor with information about the size and shape of your heart and how well your heart's chambers and valves are working. This procedure takes approximately one hour. There are no restrictions for this procedure. Please do NOT wear cologne, perfume, aftershave, or lotions (deodorant is allowed). Please arrive 15 minutes prior to your appointment time.  Please note: We ask at that you not bring children with you during ultrasound (echo/ vascular) testing. Due to room size and safety concerns, children are not allowed in the ultrasound rooms during exams. Our front office staff cannot provide observation of children in our lobby area while testing is being conducted. An adult accompanying a patient to their appointment will only be allowed in the ultrasound room at the discretion of the ultrasound technician under special circumstances. We apologize for any inconvenience.   Follow-Up: At [facility], you and your health needs are our priority.  As part of our continuing mission to provide you with exceptional heart care, our providers are all part of one team.  This team includes your primary Cardiologist (physician) and Advanced Practice Providers or APPs (Physician Assistants and Nurse Practitioners) who all work  together to provide you with the care you need, when you need it.  Your next appointment:   6 month(s)  Provider:   Ozell Fell, MD      Signed, Ozell Fell, MD  04/22/2024 12:42 PM    [facility]

## 2024-04-26 ENCOUNTER — Telehealth: Payer: Self-pay | Admitting: Cardiovascular Disease

## 2024-04-26 DIAGNOSIS — I25708 Atherosclerosis of coronary artery bypass graft(s), unspecified, with other forms of angina pectoris: Secondary | ICD-10-CM

## 2024-04-26 MED ORDER — NITROGLYCERIN 0.4 MG SL SUBL: SUBLINGUAL_TABLET | SUBLINGUAL | 11 refills | Status: AC

## 2024-04-26 MED ORDER — ISOSORBIDE MONONITRATE ER 60 MG PO TB24: 120.0000 mg | ORAL_TABLET | Freq: Every day | ORAL | 3 refills | Status: AC

## 2024-04-26 NOTE — Telephone Encounter (Signed)
*  STAT* If patient is at the pharmacy, call can be transferred to refill team.   1. Which medications need to be refilled? (please list name of each medication and dose if known)   isosorbide  mononitrate (IMDUR ) 60 MG 24 hr tablet  nitroGLYCERIN  (NITROSTAT ) 0.4 MG SL tablet   2. Would you like to learn more about the convenience, safety, & potential cost savings by using the [facility]?   3. Are you open to using the [facility] (Type [facility]. ).  4. Which pharmacy/location (including street and city if local pharmacy) is medication to be sent to?  [id number] [place], [place] - [address]   5. Do they need a 30 day or 90 day supply?   Patient stated only has 4 days left of the medication.

## 2024-04-26 NOTE — Telephone Encounter (Signed)
 Pt's medication was sent to pt's pharmacy as requested. Confirmation received.

## 2024-05-22 ENCOUNTER — Ambulatory Visit (HOSPITAL_COMMUNITY)
Admission: RE | Admit: 2024-05-22 | Discharge: 2024-05-22 | Disposition: A | Discharge status: Home / Self Care | Source: Ambulatory Visit | Attending: Cardiovascular Disease | Admitting: Cardiovascular Disease

## 2024-05-22 DIAGNOSIS — I10 Essential (primary) hypertension: Secondary | ICD-10-CM | POA: Insufficient documentation

## 2024-05-22 DIAGNOSIS — R0602 Shortness of breath: Secondary | ICD-10-CM

## 2024-05-23 LAB — ECHOCARDIOGRAM COMPLETE
Area-P 1/2: 3.85 cm2
P 1/2 time: 486 ms
S' Lateral: 2.5 cm

## 2024-05-24 ENCOUNTER — Ambulatory Visit: Payer: Self-pay | Admitting: Cardiovascular Disease

## 2024-05-27 ENCOUNTER — Ambulatory Visit: Admitting: Cardiovascular Disease

## 2024-06-10 ENCOUNTER — Telehealth: Payer: Self-pay | Admitting: Cardiovascular Disease

## 2024-06-10 DIAGNOSIS — I2510 Atherosclerotic heart disease of native coronary artery without angina pectoris: Secondary | ICD-10-CM

## 2024-06-10 DIAGNOSIS — R609 Edema, unspecified: Secondary | ICD-10-CM

## 2024-06-10 DIAGNOSIS — R002 Palpitations: Secondary | ICD-10-CM

## 2024-06-10 MED ORDER — ATORVASTATIN CALCIUM 40 MG PO TABS: 40.0000 mg | ORAL_TABLET | Freq: Every day | ORAL | 3 refills | Status: AC

## 2024-06-10 MED ORDER — CLOPIDOGREL BISULFATE 75 MG PO TABS: 75.0000 mg | ORAL_TABLET | Freq: Every day | ORAL | 3 refills | Status: AC

## 2024-06-10 NOTE — Telephone Encounter (Signed)
*  STAT* If patient is at the pharmacy, call can be transferred to refill team.   1. Which medications need to be refilled? (please list name of each medication and dose if known) atorvastatin  (LIPITOR ) 40 MG tablet  clopidogrel  (PLAVIX ) 75 MG tablet    2. Would you like to learn more about the convenience, safety, & potential cost savings by using the [facility]?    3. Are you open to using the [facility] (Type [facility].  ).   4. Which pharmacy/location (including street and city if local pharmacy) is medication to be sent to? [id number] [place], [place] - [address]    5. Do they need a 30 day or 90 day supply? 90 day

## 2024-06-10 NOTE — Telephone Encounter (Signed)
 RX sent in

## 2024-07-21 ENCOUNTER — Other Ambulatory Visit: Payer: Self-pay | Admitting: Cardiovascular Disease

## 2024-08-27 ENCOUNTER — Ambulatory Visit: Admitting: Family Medicine

## 2024-08-27 ENCOUNTER — Encounter: Payer: Self-pay | Admitting: Family Medicine

## 2024-08-27 VITALS — BP 140/90 | HR 75 | Temp 97.5°F | Ht 68.0 in | Wt 224.0 lb

## 2024-08-27 DIAGNOSIS — J069 Acute upper respiratory infection, unspecified: Secondary | ICD-10-CM

## 2024-08-27 MED ORDER — LEVOCETIRIZINE DIHYDROCHLORIDE 5 MG PO TABS: 5.0000 mg | ORAL_TABLET | Freq: Every evening | ORAL | 3 refills | Status: AC

## 2024-08-27 MED ORDER — FLUTICASONE PROPIONATE 50 MCG/ACT NA SUSP: 2.0000 | Freq: Every day | NASAL | 6 refills | Status: AC

## 2024-08-27 NOTE — Progress Notes (Signed)
 "  Subjective:    Patient ID: Sheri Hernandez, female    DOB: 03/22/1956, 68 y.o.   MRN: [id number]  Patient is a 68 year old Caucasian female who presents with a cough x 3 weeks.  Her husband has the same symptoms.  She denies any sinus pain.  She denies any tenderness to palpation over her frontal or maxillary sinuses.  She denies any sore throat.  She does complain of postnasal drip and drainage causing her to cough.  She reports thick mucus draining down her throat into her upper airways that she has to cough up.  She denies any chest pain or shortness of breath.  She denies any pleurisy.  She denies any fevers or chills or night sweats. Past Medical History:  Diagnosis Date   Acute blood loss anemia 01/12/2015   Adenocarcinoma, renal cell (HCC) 11/12/2012   Overview:   09/25/12 - CXR - lung masses  CT CAP - rt hilar mass, mult pulm nodules, LLL mass 3 x 3.1 cm, RLL mass 2.2 x 2.0 cm, left kidney mass 12.4 x 11.1 x 10.3 cm  10/05/12 PET - left lung mass 30 mm SUV 16.7, rt lung nodule 21 mm, SUV 10.4, left kidney mass 7 cm SUV 18, mass compressing RLL bronchus  10/11/12 lung biopsy - atypical cells  10/23/12 left kidney biopsy - high gr clear cell RCC    Anxiety    Arthritis    maybe in my right hand (05/26/2017)   CAD (coronary artery disease)    a. 12/2014 Cath: LM nl, LAD 20p, 30m, D2 20ost, LCX 95p, 53m, RCA dominant, 95p, 53m/d, RPDA 95, RPL2 50, attempted RCA PCI w/ acute RCA occlusion-->CABG x 2 VG->RPDA->PLVB.   Chronic diastolic CHF (congestive heart failure) (HCC)    Drug-induced hypothyroidism 03/13/2014   GERD (gastroesophageal reflux disease) 03/18/2015   Hyperglycemia 01/12/2015   Hypertension    Hypoalbuminemia    Hypothyroidism    Lower extremity edema    Lung mass    LUNG MASSES--COUGH-BIOPSY NON-DIAGNOSTIC.  HX OF MEDIASTINAL MASS AND LUNG MASSES AGE 72 -NEGATIVE BIOSPY-BUT GIVEN DIAGNOSIS OF SARCOIDOSIS.  WORK UP CONTINUING ON DIAGNOSIS FOR PT'S LUNG MASSES--SHE  HAS BIOPSY PROVEN KIDNEY CANCER   Mass of left breast 08/01/2011   Myocardial infarction (HCC) 12/2014 X 2   Pain    HX OF EPISODES OF BREIF PAIN BACK OF HEAD-RIGHT SIDE- OCCURS WHEN PT TURNS HER HEAD TO RIGHT--BUT DOESN'T HAPPEN EVERY TIME SHE TURNS HER HEAD TO RIGHT--SHE HAS HAD ALL HER LIFE AND STATES PAST WORK UPS- NEGATIVE FOR ANY BRAIN ISSUES.   Papilloma of breast 2012   left. lumpectomy   Pulmonary nodules    Renal cell cancer (HCC)    metastatic renal cell carcinoma (biopsy of left renal mass 10/2012) On nivolumab  at [facility]   Renal mass 10/01/2012   10/23/12 Bx Pos high grad clear cell renal cell ca> referred to oncology 10/26/2012     S/p nephrectomy    left, due to metastatic RCC, 3/14   Past Surgical History:  Procedure Laterality Date   BREAST BIOPSY  08/18/2011   Procedure: BREAST BIOPSY WITH NEEDLE LOCALIZATION;  Surgeon: Deward GORMAN Curvin DOUGLAS, MD;  Location: [facility] OR;  Service: General;  Laterality: Left;  left breast biopsy with needle localization   BRONCHIAL BRUSH BIOPSY  2014   CARDIAC CATHETERIZATION     CORONARY ARTERY BYPASS GRAFT N/A 01/02/2015   Procedure: CORONARY ARTERY BYPASS GRAFTING (CABG)TIMES TWO USING RIGHT GREATER SAPHENOUS LEG VEIN  HARVESTED ENDOSCOPICALLY;  Surgeon: Dallas KATHEE Jude, MD;  Location: [facility] OR;  Service: Open Heart Surgery;  Laterality: N/A;   LAPAROSCOPIC NEPHRECTOMY Left 11/22/2012   Procedure: LAPAROSCOPIC NEPHRECTOMY;  Surgeon: Noretta Ferrara, MD;  Location: [facility] ORS;  Service: Urology;  Laterality: Left;   LEFT HEART CATH AND CORS/GRAFTS ANGIOGRAPHY N/A 10/11/2016   Procedure: Left Heart Cath and Cors/Grafts Angiography;  Surgeon: Ozell Fell, MD;  Location[facility]VASIVE CV LAB;  Service: Cardiovascular;  Laterality: N/A;   LEFT HEART CATHETERIZATION WITH CORONARY ANGIOGRAM N/A 01/02/2015   Procedure: LEFT HEART CATHETERIZATION WITH CORONARY ANGIOGRAM;  Surgeon: Alm LELON Clay, MD;  Location: [facility] CATH LAB;  Service: Cardiovascular;   Laterality: N/A;   NECK LESION BIOPSY Left 08/2016   PORTA CATH INSERTION Right ~ 2017   TONSILLECTOMY  1967   VIDEO BRONCHOSCOPY  10/11/2012   Procedure: VIDEO BRONCHOSCOPY WITH FLUORO;  Surgeon: Francis CHRISTELLA Dresser, MD;  Location: [facility] ENDOSCOPY;  Service: Cardiopulmonary;  Laterality: Bilateral;   WIDS     WISDOM TOOTH EXTRACTION     all 4   Current Outpatient Medications on File Prior to Visit  Medication Sig Dispense Refill   acetaminophen  (TYLENOL ) 500 MG tablet Take 500-1,000 mg by mouth every 6 (six) hours as needed for mild pain or headache.     aspirin  81 MG EC tablet Take 1 tablet (81 mg total) by mouth daily.     atorvastatin  (LIPITOR ) 40 MG tablet Take 1 tablet (40 mg total) by mouth daily. 90 tablet 3   Blood Glucose Monitoring Suppl (BLOOD GLUCOSE SYSTEM PAK) KIT Please dispense based on patient and insurance preference. Use as directed to monitor FSBS 4x daily due to uncontrolled diabetes. Dx: E11.65 1 kit 1   Cholecalciferol (VITAMIN D-3) 25 MCG (1000 UT) CAPS Take 1,000 Units by mouth daily with breakfast.     clopidogrel  (PLAVIX ) 75 MG tablet Take 1 tablet (75 mg total) by mouth daily. 90 tablet 3   Continuous Blood Gluc Receiver (FREESTYLE LIBRE 2 READER) DEVI      Continuous Blood Gluc Sensor (FREESTYLE LIBRE 3 SENSOR) MISC SMARTSIG:1 Topical Every 2 Weeks 1 each 5   furosemide  (LASIX ) 20 MG tablet TAKE 1 TABLET BY MOUTH EVERY DAY 90 tablet 3   glipiZIDE  (GLUCOTROL  XL) 10 MG 24 hr tablet TAKE 1 TABLET (10 MG TOTAL) BY MOUTH DAILY WITH BREAKFAST. 90 tablet 1   Glucose Blood (BLOOD GLUCOSE TEST STRIPS) STRP Please dispense based on patient and insurance preference. Use as directed to monitor FSBS 4x daily due to uncontrolled diabetes. Dx: E11.65 150 strip 11   insulin  glargine (LANTUS) 100 UNIT/ML injection Inject 10 Units into the skin daily.     isosorbide  mononitrate (IMDUR ) 60 MG 24 hr tablet Take 2 tablets (120 mg total) by mouth daily. 180 tablet 3    Lancets MISC Please dispense based on patient and insurance preference. Use as directed to monitor FSBS 4x daily due to uncontrolled diabetes. Dx: E11.65 150 each 11   levothyroxine  (SYNTHROID ) 125 MCG tablet Take 1 tablet by mouth daily.     lidocaine -prilocaine  (EMLA ) cream Apply 1 application topically See admin instructions. APPLY CREAM TO PORTACATH SITE APPROXIMATELY 1 HOUR BEFORE ACCESS AND COVER     losartan  (COZAAR ) 100 MG tablet Take 1 tablet (100 mg total) by mouth daily. 90 tablet 3   metoprolol  tartrate (LOPRESSOR ) 100 MG tablet Take 1 tablet (100 mg total) by mouth 2 (two) times daily. 180 tablet 3   nitroGLYCERIN  (NITROSTAT )  0.4 MG SL tablet PLACE 1 TABLET UNDER THE TONGUE EVERY 5 MINUTES AS NEEDED FOR CHEST PAIN. 25 tablet 11   ondansetron  (ZOFRAN  ODT) 4 MG disintegrating tablet Take 1 tablet (4 mg total) by mouth every 8 (eight) hours as needed for nausea or vomiting. 20 tablet 0   OZEMPIC, 0.25 OR 0.5 MG/DOSE, 2 MG/3ML SOPN      pantoprazole  (PROTONIX ) 40 MG tablet TAKE 1 TABLET BY MOUTH EVERY DAY 90 tablet 3   spironolactone  (ALDACTONE ) 25 MG tablet Take 1 tablet (25 mg total) by mouth daily. 90 tablet 3   No current facility-administered medications on file prior to visit.   Allergies  Allergen Reactions   Silver Dermatitis and Other (See Comments)    Please use Mepilex bandaging (brown/tan, not silver)   Tape Other (See Comments)    PLEASE DO NOT USE PLASTIC TAPE!! 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   Tobacco Use   Smoking status: Former    Current packs/day: 0.00    Average packs/day: 1.5 packs/day for 40.0 years (60.0 ttl pk-yrs)    Types: Cigarettes    Start date: 09/24/1972     Quit date: 09/24/2012    Years since quitting: 11.9   Smokeless tobacco: Never  Vaping Use   Vaping status: Never Used  Substance and Sexual Activity   Alcohol use: Yes    Comment: 05/26/2017 couple drinks/year   Drug use: No   Sexual activity: Yes  Other Topics Concern   Not on file  Social History Narrative   Not on file   Social Drivers of Health   Tobacco Use: Medium Risk (08/27/2024)   Patient History    Smoking Tobacco Use: Former    Smokeless Tobacco Use: Never    Passive Exposure: Not on Actuary Strain: Low Risk (03/28/2024)   Overall Financial Resource Strain (CARDIA)    Difficulty of Paying Living Expenses: Not hard at all  Food Insecurity: No Food Insecurity (03/28/2024)   Epic    Worried About Radiation Protection Practitioner of Food in the Last Year: Never true    Ran Out of Food in the Last Year: Never true  Transportation Needs: No Transportation Needs (03/28/2024)   Epic    Lack of Transportation (Medical): No    Lack of Transportation (Non-Medical): No  Physical Activity: Inactive (03/28/2024)   Exercise Vital Sign    Days of Exercise per Week: 0 days    Minutes of Exercise per Session: 0 min  Stress: Stress Concern Present (03/28/2024)   [employer] of Occupational Health - Occupational Stress Questionnaire    Feeling of Stress: To some extent  Social Connections: Moderately Integrated (03/28/2024)   Social Connection and Isolation Panel    Frequency of Communication with Friends and Family: More than three times a week    Frequency of Social Gatherings with Friends and Family: More than three times a week    Attends Religious Services: Never    Database Administrator or Organizations: Yes    Attends Engineer, Structural: More than 4 times per year    Marital Status: Married  Catering Manager Violence: Not At Risk (  03/28/2024)   Epic    Fear of Current or Ex-Partner: No    Emotionally Abused: No    Physically  Abused: No    Sexually Abused: No  Depression (PHQ2-9): Low Risk (08/27/2024)   Depression (PHQ2-9)    PHQ-2 Score: 0  Alcohol Screen: Low Risk (03/28/2024)   Alcohol Screen    Last Alcohol Screening Score (AUDIT): 0  Housing: Unknown (03/28/2024)   Epic    Unable to Pay for Housing in the Last Year: No    Number of Times Moved in the Last Year: Not on file    Homeless in the Last Year: No  Utilities: Not At Risk (03/28/2024)   Epic    Threatened with loss of utilities: No  Health Literacy: Adequate Health Literacy (03/28/2024)   B1300 Health Literacy    Frequency of need for help with medical instructions: Never      Review of Systems  All other systems reviewed and are negative.      Objective:   Physical Exam Vitals reviewed.  Constitutional:      General: She is not in acute distress.    Appearance: Normal appearance. She is normal weight. She is not ill-appearing.  HENT:     Right Ear: There is impacted cerumen.     Left Ear: There is impacted cerumen.     Nose: Congestion present. No rhinorrhea.     Mouth/Throat:     Mouth: Mucous membranes are moist.     Pharynx: No oropharyngeal exudate or posterior oropharyngeal erythema.  Eyes:     Conjunctiva/sclera: Conjunctivae normal.  Cardiovascular:     Rate and Rhythm: Normal rate and regular rhythm.     Pulses: Normal pulses.     Heart sounds: Normal heart sounds. No murmur heard.    No friction rub. No gallop.  Pulmonary:     Effort: Pulmonary effort is normal. No respiratory distress.     Breath sounds: Normal breath sounds. No wheezing, rhonchi or rales.  Lymphadenopathy:     Cervical: No cervical adenopathy.  Neurological:     General: No focal deficit present.     Mental Status: She is alert and oriented to person, place, and time.  Psychiatric:        Attention and Perception: Attention normal.        Behavior: Behavior normal.        Thought Content: Thought content normal.        Cognition and  Memory: Cognition and memory normal.        Judgment: Judgment normal.          Assessment & Plan:  Viral upper respiratory tract infection I believe both the patient and her husband had viral upper respiratory infections and now are dealing with cough due to postnasal drip and drainage.  I recommended trying Flonase  2 sprays each nostril daily and Xyzal  5 mg daily to help decrease the postnasal drip to improve the cough.  If the patient develops fevers or shortness of breath or purulent sputum she would need a chest x-ray and to be reevaluated immediately especially given her past medical history  "

## 2024-09-02 ENCOUNTER — Emergency Department (HOSPITAL_COMMUNITY)

## 2024-09-02 ENCOUNTER — Emergency Department (HOSPITAL_COMMUNITY)
Admission: EM | Admit: 2024-09-02 | Discharge: 2024-09-02 | Disposition: A | Discharge status: Home / Self Care | Attending: Emergency Medicine | Admitting: Emergency Medicine

## 2024-09-02 ENCOUNTER — Other Ambulatory Visit: Payer: Self-pay

## 2024-09-02 DIAGNOSIS — Z7984 Long term (current) use of oral hypoglycemic drugs: Secondary | ICD-10-CM | POA: Insufficient documentation

## 2024-09-02 DIAGNOSIS — Z794 Long term (current) use of insulin: Secondary | ICD-10-CM | POA: Insufficient documentation

## 2024-09-02 DIAGNOSIS — Z7982 Long term (current) use of aspirin: Secondary | ICD-10-CM | POA: Insufficient documentation

## 2024-09-02 DIAGNOSIS — I110 Hypertensive heart disease with heart failure: Secondary | ICD-10-CM | POA: Diagnosis not present

## 2024-09-02 DIAGNOSIS — Z85528 Personal history of other malignant neoplasm of kidney: Secondary | ICD-10-CM | POA: Insufficient documentation

## 2024-09-02 DIAGNOSIS — Z7902 Long term (current) use of antithrombotics/antiplatelets: Secondary | ICD-10-CM | POA: Insufficient documentation

## 2024-09-02 DIAGNOSIS — E1165 Type 2 diabetes mellitus with hyperglycemia: Secondary | ICD-10-CM | POA: Diagnosis not present

## 2024-09-02 DIAGNOSIS — R002 Palpitations: Secondary | ICD-10-CM | POA: Diagnosis not present

## 2024-09-02 DIAGNOSIS — I509 Heart failure, unspecified: Secondary | ICD-10-CM | POA: Diagnosis not present

## 2024-09-02 DIAGNOSIS — Z79899 Other long term (current) drug therapy: Secondary | ICD-10-CM | POA: Insufficient documentation

## 2024-09-02 DIAGNOSIS — R079 Chest pain, unspecified: Secondary | ICD-10-CM | POA: Insufficient documentation

## 2024-09-02 DIAGNOSIS — I2510 Atherosclerotic heart disease of native coronary artery without angina pectoris: Secondary | ICD-10-CM | POA: Diagnosis not present

## 2024-09-02 DIAGNOSIS — R739 Hyperglycemia, unspecified: Secondary | ICD-10-CM

## 2024-09-02 LAB — I-STAT VENOUS BLOOD GAS, ED
Acid-Base Excess: 3 mmol/L — ABNORMAL HIGH (ref 0.0–2.0)
Bicarbonate: 29.2 mmol/L — ABNORMAL HIGH (ref 20.0–28.0)
Calcium, Ion: 1.21 mmol/L (ref 1.15–1.40)
HCT: 53 % — ABNORMAL HIGH (ref 36.0–46.0)
Hemoglobin: 18 g/dL — ABNORMAL HIGH (ref 12.0–15.0)
O2 Saturation: 67 %
Potassium: 3.9 mmol/L (ref 3.5–5.1)
Sodium: 139 mmol/L (ref 135–145)
TCO2: 31 mmol/L (ref 22–32)
pCO2, Ven: 46.6 mmHg (ref 44–60)
pH, Ven: 7.406 (ref 7.25–7.43)
pO2, Ven: 35 mmHg (ref 32–45)

## 2024-09-02 LAB — BASIC METABOLIC PANEL WITH GFR
Anion gap: 14 (ref 5–15)
BUN: 14 mg/dL (ref 8–23)
CO2: 21 mmol/L — ABNORMAL LOW (ref 22–32)
Calcium: 10 mg/dL (ref 8.9–10.3)
Chloride: 97 mmol/L — ABNORMAL LOW (ref 98–111)
Creatinine, Ser: 0.95 mg/dL (ref 0.44–1.00)
GFR, Estimated: >60 mL/min
Glucose, Bld: 471 mg/dL — ABNORMAL HIGH (ref 70–99)
Potassium: 4.3 mmol/L (ref 3.5–5.1)
Sodium: 132 mmol/L — ABNORMAL LOW (ref 135–145)

## 2024-09-02 LAB — CBC
HCT: 49.8 % — ABNORMAL HIGH (ref 36.0–46.0)
Hemoglobin: 16.3 g/dL — ABNORMAL HIGH (ref 12.0–15.0)
MCH: 29.9 pg (ref 26.0–34.0)
MCHC: 32.7 g/dL (ref 30.0–36.0)
MCV: 91.2 fL (ref 80.0–100.0)
Platelets: 298 K/uL (ref 150–400)
RBC: 5.46 MIL/uL — ABNORMAL HIGH (ref 3.87–5.11)
RDW: 15.1 % (ref 11.5–15.5)
WBC: 10.4 K/uL (ref 4.0–10.5)
nRBC: 0 % (ref 0.0–0.2)

## 2024-09-02 LAB — CBG MONITORING, ED
Glucose-Capillary: 456 mg/dL — ABNORMAL HIGH (ref 70–99)
Glucose-Capillary: 410 mg/dL — ABNORMAL HIGH (ref 70–99)
Glucose-Capillary: 333 mg/dL — ABNORMAL HIGH (ref 70–99)
Glucose-Capillary: 290 mg/dL — ABNORMAL HIGH (ref 70–99)

## 2024-09-02 LAB — TROPONIN T, HIGH SENSITIVITY: Troponin T High Sensitivity: <15 ng/L (ref 0–19)

## 2024-09-02 MED ORDER — SODIUM CHLORIDE 0.9 % IV BOLUS
1000.0000 mL | Freq: Once | INTRAVENOUS | Status: AC
  Administered 2024-09-02: 1000 mL via INTRAVENOUS

## 2024-09-02 MED ORDER — INSULIN ASPART 100 UNIT/ML IJ SOLN
10.0000 [IU] | Freq: Once | INTRAMUSCULAR | Status: AC
  Administered 2024-09-02: 10 [IU] via INTRAVENOUS
  Filled 2024-09-02: qty 10

## 2024-09-02 MED ORDER — METOPROLOL TARTRATE 25 MG PO TABS
100.0000 mg | ORAL_TABLET | Freq: Once | ORAL | Status: AC
  Administered 2024-09-02: 100 mg via ORAL
  Filled 2024-09-02: qty 4

## 2024-09-02 MED ORDER — LOSARTAN POTASSIUM 50 MG PO TABS
100.0000 mg | ORAL_TABLET | Freq: Once | ORAL | Status: AC
  Administered 2024-09-02: 100 mg via ORAL
  Filled 2024-09-02: qty 2

## 2024-09-02 NOTE — Discharge Instructions (Signed)
 Continue to take medications as prescribed.  Watch blood sugars at home.  Please follow-up with PCP or endocrinologist in the next 1 to 2 weeks for recheck.  Return to ED if any symptoms worsen including severe chest pain, uncontrolled nausea/vomiting, severe abdominal pain, difficulty breathing.

## 2024-09-02 NOTE — ED Triage Notes (Signed)
 Pt bib [facility] from home, sudden onset chest pain after eating chinese food in middle of chest, does not radiate. Improved once en route. Not given nitroglycerin , only aspirin . Hx of 2 MIs and angina.   CBG 575 and then took 20 units insulin  right before en route.  EMS vitals:  180/90  100-110 HR SPO2 low 90, 2L [place]

## 2024-09-02 NOTE — ED Provider Notes (Signed)
 " [place] EMERGENCY DEPARTMENT AT [facility] Provider Note   CSN: [id number] Arrival date & time: 09/02/24  1532     Patient presents with: Chest Pain   Sheri Hernandez is a 68 y.o. female.  Patient is a 68 year old female with a history of CAD, renal cell adenocarcinoma, type 2 diabetes, and CHF who presents to the ED via EMS for an episode of chest pain and palpitations that occurred around lunchtime.  Patient notes she was eating her leftover Chinese food when after she finished, she felt very flushed and started having palpitations.  She notes she felt clammy and her hands felt tingling.  She notes this lasted approximately 30 to 45 minutes.  When she got in the ambulance, she was not having any symptoms.  She states she still feels back to normal but wanted to be checked due to her heart history.  Notes her renal cancer is gone as of last year.  She states she had forgotten to take her insulin  this morning and did take her 20 units prior to getting on the ambulance.  She also notes she is post to take Trulicity but is not taking as it hurts when she does the injection.  Denies headache, dizziness, shortness of breath, abdominal pain, nausea/vomiting/diarrhea.  No further complaints.    Chest Pain Associated symptoms: palpitations and weakness   Associated symptoms: no fever, no nausea, no shortness of breath and no vomiting        Prior to Admission medications  Medication Sig Start Date End Date Taking? Authorizing Provider  acetaminophen  (TYLENOL ) 500 MG tablet Take 500-1,000 mg by mouth every 6 (six) hours as needed for mild pain or headache.    [provider]  aspirin  81 MG EC tablet Take 1 tablet (81 mg total) by mouth daily. 02/09/16   Wonda Sharper, MD  atorvastatin  (LIPITOR ) 40 MG tablet Take 1 tablet (40 mg total) by mouth daily. 06/10/24   Wonda Sharper, MD  Blood Glucose Monitoring Suppl (BLOOD GLUCOSE SYSTEM PAK) KIT Please dispense based on  patient and insurance preference. Use as directed to monitor FSBS 4x daily due to uncontrolled diabetes. Dx: E11.65 05/11/21   Duanne Butler DASEN, MD  Cholecalciferol (VITAMIN D-3) 25 MCG (1000 UT) CAPS Take 1,000 Units by mouth daily with breakfast.    [provider]  clopidogrel  (PLAVIX ) 75 MG tablet Take 1 tablet (75 mg total) by mouth daily. 06/10/24   Wonda Sharper, MD  Continuous Blood Gluc Receiver (FREESTYLE LIBRE 2 READER) DEVI  08/19/21   [provider]  Continuous Blood Gluc Sensor (FREESTYLE LIBRE 3 SENSOR) MISC SMARTSIG:1 Topical Every 2 Weeks 06/08/22   Duanne Butler DASEN, MD  fluticasone  (FLONASE ) 50 MCG/ACT nasal spray Place 2 sprays into both nostrils daily. 08/27/24   Duanne Butler DASEN, MD  furosemide  (LASIX ) 20 MG tablet TAKE 1 TABLET BY MOUTH EVERY DAY 07/24/24   Wonda Sharper, MD  glipiZIDE  (GLUCOTROL  XL) 10 MG 24 hr tablet TAKE 1 TABLET (10 MG TOTAL) BY MOUTH DAILY WITH BREAKFAST. 08/09/21   Duanne Butler DASEN, MD  Glucose Blood (BLOOD GLUCOSE TEST STRIPS) STRP Please dispense based on patient and insurance preference. Use as directed to monitor FSBS 4x daily due to uncontrolled diabetes. Dx: E11.65 05/11/21   Duanne Butler DASEN, MD  insulin  glargine (LANTUS) 100 UNIT/ML injection Inject 10 Units into the skin daily.    [provider]  isosorbide  mononitrate (IMDUR ) 60 MG 24 hr tablet Take 2 tablets (  120 mg total) by mouth daily. 04/26/24   Wonda Sharper, MD  Lancets MISC Please dispense based on patient and insurance preference. Use as directed to monitor FSBS 4x daily due to uncontrolled diabetes. Dx: E11.65 05/11/21   Duanne Butler DASEN, MD  levocetirizine (XYZAL  ALLERGY 24HR) 5 MG tablet Take 1 tablet (5 mg total) by mouth every evening. 08/27/24   Duanne Butler DASEN, MD  levothyroxine  (SYNTHROID ) 125 MCG tablet Take 1 tablet by mouth daily. 11/17/21   [provider]  lidocaine -prilocaine  (EMLA ) cream Apply 1 application topically See admin  instructions. APPLY CREAM TO PORTACATH SITE APPROXIMATELY 1 HOUR BEFORE ACCESS AND COVER 01/09/19   [provider]  losartan  (COZAAR ) 100 MG tablet Take 1 tablet (100 mg total) by mouth daily. 03/14/24   Wyn Jackee VEAR Mickey., NP  metoprolol  tartrate (LOPRESSOR ) 100 MG tablet Take 1 tablet (100 mg total) by mouth 2 (two) times daily. 03/14/24   Wyn Jackee VEAR Mickey., NP  nitroGLYCERIN  (NITROSTAT ) 0.4 MG SL tablet PLACE 1 TABLET UNDER THE TONGUE EVERY 5 MINUTES AS NEEDED FOR CHEST PAIN. 04/26/24   Wonda Sharper, MD  ondansetron  (ZOFRAN  ODT) 4 MG disintegrating tablet Take 1 tablet (4 mg total) by mouth every 8 (eight) hours as needed for nausea or vomiting. 05/14/19   Bari Theodoro FALCON, MD  OZEMPIC, 0.25 OR 0.5 MG/DOSE, 2 MG/3ML [facility]  10/31/23   [provider]  pantoprazole  (PROTONIX ) 40 MG tablet TAKE 1 TABLET BY MOUTH EVERY DAY 03/06/24   Patwardhan, Newman PARAS, MD  spironolactone  (ALDACTONE ) 25 MG tablet Take 1 tablet (25 mg total) by mouth daily. 03/14/24   Wyn Jackee VEAR Mickey., NP    Allergies: Silver and Tape    Review of Systems  Constitutional:  Negative for fever.  Respiratory:  Negative for shortness of breath.   Cardiovascular:  Positive for chest pain and palpitations.  Gastrointestinal:  Negative for nausea and vomiting.  Neurological:  Positive for weakness.  All other systems reviewed and are negative.   Updated Vital Signs BP (!) 195/93 (BP Location: Right Arm)   Pulse 85   Temp 97.8 F (36.6 C) (Oral)   Resp 20   Ht 5' 8 (1.727 m)   Wt 101 kg   SpO2 96%   BMI 33.86 kg/m   Physical Exam Constitutional:      Appearance: She is well-developed.  HENT:     Head: Normocephalic and atraumatic.  Cardiovascular:     Rate and Rhythm: Normal rate.  Pulmonary:     Effort: Pulmonary effort is normal.     Breath sounds: Normal breath sounds.  Skin:    General: Skin is warm and dry.  Neurological:     Mental Status: She is alert and oriented to person, place, and  time.  Psychiatric:        Mood and Affect: Mood normal.        Behavior: Behavior normal.     (all labs ordered are listed, but only abnormal results are displayed) Labs Reviewed  BASIC METABOLIC PANEL WITH GFR - Abnormal; Notable for the following components:      Result Value   Sodium 132 (*)    Chloride 97 (*)    CO2 21 (*)    Glucose, Bld 471 (*)    All other components within normal limits  CBC - Abnormal; Notable for the following components:   RBC 5.46 (*)    Hemoglobin 16.3 (*)    HCT 49.8 (*)  All other components within normal limits  CBG MONITORING, ED - Abnormal; Notable for the following components:   Glucose-Capillary 456 (*)    All other components within normal limits  I-STAT VENOUS BLOOD GAS, ED - Abnormal; Notable for the following components:   Bicarbonate 29.2 (*)    Acid-Base Excess 3.0 (*)    HCT 53.0 (*)    Hemoglobin 18.0 (*)    All other components within normal limits  CBG MONITORING, ED - Abnormal; Notable for the following components:   Glucose-Capillary 410 (*)    All other components within normal limits  CBG MONITORING, ED - Abnormal; Notable for the following components:   Glucose-Capillary 333 (*)    All other components within normal limits  CBG MONITORING, ED - Abnormal; Notable for the following components:   Glucose-Capillary 290 (*)    All other components within normal limits  TROPONIN T, HIGH SENSITIVITY    EKG: EKG Interpretation Date/Time:  Monday September 02 2024 15:50:35 EST Ventricular Rate:  99 PR Interval:  180 QRS Duration:  100 QT Interval:  364 QTC Calculation: 467 R Axis:   17  Text Interpretation: Sinus rhythm with occasional Premature ventricular complexes Left ventricular hypertrophy with repolarization abnormality ( R in aVL , Cornell product ) Anterior infarct , age undetermined Abnormal ECG When compared with ECG of 05-Jan-2024 15:29, No significant change since last tracing Confirmed by Francesca Fallow  [phone]) on 09/02/2024 10:42:13 PM  Radiology: [facility] Chest 2 View Result Date: 09/02/2024 CLINICAL DATA:  Chest pain. EXAM: CHEST - 2 VIEW COMPARISON:  09/25/2020 FINDINGS: Right chest port in place. Stable cardiomegaly post median sternotomy. Chronic interstitial coarsening. No evidence of acute airspace disease. No pleural fluid or pneumothorax. On limited assessment, no acute osseous findings. IMPRESSION: Stable cardiomegaly. Chronic interstitial coarsening. No acute findings. Electronically Signed   By: Andrea Gasman M.D.   On: 09/02/2024 17:53      Medications Ordered in the ED  sodium chloride  0.9 % bolus 1,000 mL (0 mLs Intravenous Stopped 09/02/24 2301)  insulin  aspart (novoLOG ) injection 10 Units (10 Units Intravenous Given 09/02/24 2040)  metoprolol  tartrate (LOPRESSOR ) tablet 100 mg (100 mg Oral Given 09/02/24 2225)  losartan  (COZAAR ) tablet 100 mg (100 mg Oral Given 09/02/24 2226)                                 Medical Decision Making Patient is a 68 year old female with a history of CAD, CHF, type 2 diabetes, and renal cell carcinoma who presents to the ED via EMS for an episode of chest pain that occurred after eating leftover Chinese food earlier today.  Currently asymptomatic.  Please see detailed HPI above.  On exam patient is alert and in no acute distress.  Physical exam as noted.  Lungs clear to auscultation.  Differential includes GERD, gastritis, PUD, ACS, DKA.  Patient was noted to have a blood sugar in the 500s with EMS.  Initial blood sugar 456 on arrival to ED.  Patient given a liter fluid and 10 units of insulin .  Blood sugar has improved to 290.  Troponin negative, less concerns for ACS.  EKG shows sinus rhythm with occasional PVCs.  ABG does not show any acute acidosis, no acute concerns for DKA.  Chest x-ray reviewed and stable compared to baseline.  Suspect patient may have had indigestion secondary to lunch, less concerns for emergent process.  Patient notes she  still  feels fine and has been asymptomatic since lunch.  Will discharge home.  Patient is noted to be hypertensive but has not taken any of her medications today.  She was given her metoprolol  and losartan  while in ED.  Denies headache, dizziness, chest pain, shortness of breath at this time.  Advised to continue medications as prescribed and follow-up with PCP in 1 week for recheck.  Patient verbalized understanding.  Advised to continue monitoring blood sugars and follow-up with endocrinology for further medication as needed.  Return precautions provided for worsening symptoms.  Amount and/or Complexity of Data Reviewed Labs: ordered. Radiology: ordered.  Risk Prescription drug management.       Final diagnoses:  Chest pain, unspecified type  Hyperglycemia    ED Discharge Orders     None          Sheri Hernandez 09/02/24 2305    Francesca Elsie CROME, MD 09/02/24 [phone]  "

## 2024-11-01 ENCOUNTER — Ambulatory Visit: Admitting: Cardiovascular Disease

## 2025-04-03 ENCOUNTER — Encounter
